# Patient Record
Sex: Female | Born: 1975
Health system: Southern US, Community
[De-identification: ages and names within clinical notes are randomized; demographics above are authoritative.]

## PROBLEM LIST (undated history)

## (undated) DIAGNOSIS — R7303 Prediabetes: Secondary | ICD-10-CM

## (undated) DIAGNOSIS — C50919 Malignant neoplasm of unspecified site of unspecified female breast: Secondary | ICD-10-CM

## (undated) DIAGNOSIS — T7840XA Allergy, unspecified, initial encounter: Secondary | ICD-10-CM

## (undated) DIAGNOSIS — Z973 Presence of spectacles and contact lenses: Secondary | ICD-10-CM

## (undated) DIAGNOSIS — L0390 Cellulitis, unspecified: Secondary | ICD-10-CM

## (undated) HISTORY — DX: Malignant neoplasm of unspecified site of unspecified female breast: C50.919

## (undated) HISTORY — PX: BREAST SURGERY: SHX581

## (undated) HISTORY — PX: ABDOMINAL HYSTERECTOMY: SHX81

## (undated) HISTORY — DX: Allergy, unspecified, initial encounter: T78.40XA

## (undated) HISTORY — PX: OTHER SURGICAL HISTORY: SHX169

---

## 1998-09-22 ENCOUNTER — Emergency Department (HOSPITAL_COMMUNITY): Admission: EM | Admit: 1998-09-22 | Discharge: 1998-09-22 | Payer: Self-pay | Admitting: Emergency Medicine

## 1999-08-19 ENCOUNTER — Other Ambulatory Visit: Admission: RE | Admit: 1999-08-19 | Discharge: 1999-08-19 | Payer: Self-pay | Admitting: Obstetrics and Gynecology

## 1999-09-24 ENCOUNTER — Encounter (INDEPENDENT_AMBULATORY_CARE_PROVIDER_SITE_OTHER): Payer: Self-pay | Admitting: Specialist

## 1999-09-24 ENCOUNTER — Inpatient Hospital Stay (HOSPITAL_COMMUNITY): Admission: AD | Admit: 1999-09-24 | Discharge: 1999-09-25 | Payer: Self-pay | Admitting: Obstetrics and Gynecology

## 2003-03-13 ENCOUNTER — Emergency Department (HOSPITAL_COMMUNITY): Admission: EM | Admit: 2003-03-13 | Discharge: 2003-03-13 | Payer: Self-pay | Admitting: Emergency Medicine

## 2003-03-13 ENCOUNTER — Encounter: Payer: Self-pay | Admitting: Emergency Medicine

## 2005-05-13 ENCOUNTER — Other Ambulatory Visit: Admission: RE | Admit: 2005-05-13 | Discharge: 2005-05-13 | Payer: Self-pay | Admitting: Obstetrics and Gynecology

## 2005-11-24 ENCOUNTER — Inpatient Hospital Stay (HOSPITAL_COMMUNITY): Admission: AD | Admit: 2005-11-24 | Discharge: 2005-11-26 | Payer: Self-pay | Admitting: Obstetrics and Gynecology

## 2010-05-31 ENCOUNTER — Emergency Department (HOSPITAL_COMMUNITY): Admission: EM | Admit: 2010-05-31 | Discharge: 2010-05-31 | Payer: Self-pay | Admitting: Emergency Medicine

## 2011-01-06 ENCOUNTER — Other Ambulatory Visit: Payer: Self-pay | Admitting: Obstetrics and Gynecology

## 2011-01-06 DIAGNOSIS — Z1231 Encounter for screening mammogram for malignant neoplasm of breast: Secondary | ICD-10-CM

## 2011-01-15 ENCOUNTER — Ambulatory Visit
Admission: RE | Admit: 2011-01-15 | Discharge: 2011-01-15 | Disposition: A | Discharge status: Home / Self Care | Payer: BC Managed Care – PPO | Source: Ambulatory Visit | Attending: Obstetrics and Gynecology | Admitting: Obstetrics and Gynecology

## 2011-01-15 DIAGNOSIS — Z1231 Encounter for screening mammogram for malignant neoplasm of breast: Secondary | ICD-10-CM

## 2011-04-10 NOTE — Discharge Summary (Signed)
Cheryl [name], Cheryl [name] NO.:  [id number]   MEDICAL RECORD NO.:  [id number]          PATIENT TYPE:  INP   LOCATION:  9101                          FACILITY:  [facility]   PHYSICIAN:  Huel Cote, M.D. DATE OF BIRTH:  1976-01-27   DATE OF ADMISSION:  11/24/2005  DATE OF DISCHARGE:  11/26/2005                                 DISCHARGE SUMMARY   DISCHARGE DIAGNOSES:  1.  Term pregnancy at 39 plus weeks, delivered.  2.  Status post normal spontaneous vaginal delivery.   DISCHARGE MEDICATIONS:  1.  Motrin 600 milligrams p.o. every 6 hours.  2.  Percocet 1-2 tablets p.o. every 4 hours p.r.n.   HOSPITAL COURSE:  The patient is 35 year old G5, P 2-0-2-2 who was admitted  at 39-5/[redacted] weeks gestation for induction of labor, given term status and  favorable cervix. Prenatal care was uncomplicated. The patient had expressed  her desire for tubal ligation, however, changed her mind regarding this  after admission.   PRENATAL LABORATORIES:  A+, antibody negative, RPR nonreactive, rubella  immune, hepatitis B surface antigen negative, HIV declined, GC negative,  chlamydia negative, group B strep negative, 1-hour Glucola 120.   PAST OBSTETRICAL HISTORY:  She had one elective abortion in 1992.  She had a  vaginal delivery of an 8 pound 5 ounce infant in 1997.  She had a vaginal  delivery of 7 pound 10 ounce infant in 2000.  She had an IUFD at 14 weeks  with a Cytotec induction.   PAST GYNECOLOGIC HISTORY:  None.   PAST SURGICAL HISTORY:  None.   PAST MEDICAL HISTORY:  History of anemia.   MEDICATIONS:  None.   ALLERGIES:  None.   PHYSICAL EXAMINATION:  VITAL SIGNS:  Stable vital signs on admission.  PELVIC:  Cervix was 52 at a -1 station.   She had rupture of membranes performed and was placed on IV Pitocin.  She  progressed to complete dilation and pushed with a normal spontaneous vaginal  delivery of a vigorous female infant over a small first-degree laceration.  Apgars were 8/9.  Weight was 7 pounds 12 ounces. Placenta delivered  spontaneously with a first-degree laceration reapproximated with 2-0 Vicryl  for hemostasis. The patient and husband, after careful consideration,  declined to proceed with a postpartum tubal ligation. She was then admitted  for routine postpartum care and did very well.   On postpartum day #2, she was having adequate pain control with p.o.  medications, voiding without difficulty, and had normal lochia. Her  postpartum hemoglobin was 8.1, down from 8.7.  She was felt stable for  discharge home.   FOLLOW UP:  She was discharged with follow-up in the office in 6 weeks.      Huel Cote, M.D.  Electronically Signed     KR/MEDQ  D:  12/29/2005  T:  12/29/2005  Job:  [id number]

## 2014-06-06 ENCOUNTER — Other Ambulatory Visit (HOSPITAL_COMMUNITY): Payer: Self-pay | Admitting: Obstetrics and Gynecology

## 2014-06-06 DIAGNOSIS — N971 Female infertility of tubal origin: Secondary | ICD-10-CM

## 2014-06-13 ENCOUNTER — Ambulatory Visit (HOSPITAL_COMMUNITY)
Admission: RE | Admit: 2014-06-13 | Discharge: 2014-06-13 | Disposition: A | Discharge status: Home / Self Care | Payer: Federal, State, Local not specified - PPO | Source: Ambulatory Visit | Attending: Obstetrics and Gynecology | Admitting: Obstetrics and Gynecology

## 2014-06-13 DIAGNOSIS — Z3049 Encounter for surveillance of other contraceptives: Secondary | ICD-10-CM | POA: Insufficient documentation

## 2014-06-13 DIAGNOSIS — N971 Female infertility of tubal origin: Secondary | ICD-10-CM

## 2014-06-13 MED ORDER — IOHEXOL 300 MG/ML  SOLN
20.0000 mL | Freq: Once | INTRAMUSCULAR | Status: AC | PRN
  Administered 2014-06-13: 20 mL

## 2014-11-23 DIAGNOSIS — C50919 Malignant neoplasm of unspecified site of unspecified female breast: Secondary | ICD-10-CM

## 2014-11-23 HISTORY — DX: Malignant neoplasm of unspecified site of unspecified female breast: C50.919

## 2015-02-06 ENCOUNTER — Other Ambulatory Visit: Payer: Self-pay | Admitting: Obstetrics and Gynecology

## 2015-02-06 DIAGNOSIS — R928 Other abnormal and inconclusive findings on diagnostic imaging of breast: Secondary | ICD-10-CM

## 2015-02-08 ENCOUNTER — Ambulatory Visit
Admission: RE | Admit: 2015-02-08 | Discharge: 2015-02-08 | Disposition: A | Discharge status: Home / Self Care | Payer: Federal, State, Local not specified - PPO | Source: Ambulatory Visit | Attending: Obstetrics and Gynecology | Admitting: Obstetrics and Gynecology

## 2015-02-08 ENCOUNTER — Other Ambulatory Visit: Payer: Self-pay | Admitting: Obstetrics and Gynecology

## 2015-02-08 DIAGNOSIS — R928 Other abnormal and inconclusive findings on diagnostic imaging of breast: Secondary | ICD-10-CM

## 2015-02-13 ENCOUNTER — Other Ambulatory Visit: Payer: Self-pay | Admitting: Obstetrics and Gynecology

## 2015-02-13 ENCOUNTER — Telehealth: Payer: Self-pay | Admitting: *Deleted

## 2015-02-13 DIAGNOSIS — Z171 Estrogen receptor negative status [ER-]: Secondary | ICD-10-CM | POA: Insufficient documentation

## 2015-02-13 DIAGNOSIS — C50912 Malignant neoplasm of unspecified site of left female breast: Secondary | ICD-10-CM

## 2015-02-13 DIAGNOSIS — C50412 Malignant neoplasm of upper-outer quadrant of left female breast: Secondary | ICD-10-CM

## 2015-02-13 HISTORY — DX: Estrogen receptor negative status (ER-): C50.412

## 2015-02-13 NOTE — Telephone Encounter (Signed)
Confirmed [facility] for 02/20/15 at 8am .  Instructions and contact information given.

## 2015-02-15 ENCOUNTER — Ambulatory Visit
Admission: RE | Admit: 2015-02-15 | Discharge: 2015-02-15 | Disposition: A | Discharge status: Home / Self Care | Payer: Federal, State, Local not specified - PPO | Source: Ambulatory Visit | Attending: Obstetrics and Gynecology | Admitting: Obstetrics and Gynecology

## 2015-02-15 DIAGNOSIS — C50912 Malignant neoplasm of unspecified site of left female breast: Secondary | ICD-10-CM

## 2015-02-15 MED ORDER — GADOBENATE DIMEGLUMINE 529 MG/ML IV SOLN
20.0000 mL | Freq: Once | INTRAVENOUS | Status: AC | PRN
  Administered 2015-02-15: 20 mL via INTRAVENOUS

## 2015-02-18 ENCOUNTER — Ambulatory Visit
Admission: RE | Admit: 2015-02-18 | Discharge: 2015-02-18 | Disposition: A | Discharge status: Home / Self Care | Payer: Federal, State, Local not specified - PPO | Source: Ambulatory Visit | Attending: Obstetrics and Gynecology | Admitting: Obstetrics and Gynecology

## 2015-02-18 ENCOUNTER — Other Ambulatory Visit: Payer: Federal, State, Local not specified - PPO

## 2015-02-18 ENCOUNTER — Other Ambulatory Visit: Payer: Self-pay | Admitting: Obstetrics and Gynecology

## 2015-02-18 DIAGNOSIS — R928 Other abnormal and inconclusive findings on diagnostic imaging of breast: Secondary | ICD-10-CM

## 2015-02-18 HISTORY — PX: BREAST BIOPSY: SHX20

## 2015-02-20 ENCOUNTER — Ambulatory Visit: Payer: Federal, State, Local not specified - PPO | Attending: General Surgery | Admitting: Physical Therapy

## 2015-02-20 ENCOUNTER — Telehealth: Payer: Self-pay | Admitting: Oncology

## 2015-02-20 ENCOUNTER — Ambulatory Visit (HOSPITAL_BASED_OUTPATIENT_CLINIC_OR_DEPARTMENT_OTHER): Payer: Federal, State, Local not specified - PPO | Admitting: Oncology

## 2015-02-20 ENCOUNTER — Encounter: Payer: Self-pay | Admitting: Oncology

## 2015-02-20 ENCOUNTER — Other Ambulatory Visit (HOSPITAL_BASED_OUTPATIENT_CLINIC_OR_DEPARTMENT_OTHER): Payer: Federal, State, Local not specified - PPO

## 2015-02-20 ENCOUNTER — Encounter: Payer: Self-pay | Admitting: Skilled Nursing Facility1

## 2015-02-20 ENCOUNTER — Encounter: Payer: Self-pay | Admitting: Physical Therapy

## 2015-02-20 ENCOUNTER — Encounter: Payer: Self-pay | Admitting: General Practice

## 2015-02-20 ENCOUNTER — Ambulatory Visit
Admission: RE | Admit: 2015-02-20 | Discharge: 2015-02-20 | Disposition: A | Discharge status: Home / Self Care | Payer: Federal, State, Local not specified - PPO | Source: Ambulatory Visit | Attending: Radiation Oncology | Admitting: Radiation Oncology

## 2015-02-20 ENCOUNTER — Ambulatory Visit: Payer: Federal, State, Local not specified - PPO

## 2015-02-20 VITALS — BP 143/83 | HR 67 | Resp 18 | Ht 62.0 in | Wt 218.5 lb

## 2015-02-20 DIAGNOSIS — D0512 Intraductal carcinoma in situ of left breast: Secondary | ICD-10-CM | POA: Diagnosis not present

## 2015-02-20 DIAGNOSIS — Z6839 Body mass index (BMI) 39.0-39.9, adult: Secondary | ICD-10-CM

## 2015-02-20 DIAGNOSIS — C50412 Malignant neoplasm of upper-outer quadrant of left female breast: Secondary | ICD-10-CM

## 2015-02-20 DIAGNOSIS — R293 Abnormal posture: Secondary | ICD-10-CM | POA: Diagnosis not present

## 2015-02-20 LAB — CBC WITH DIFFERENTIAL/PLATELET
BASO%: 0.8 % (ref 0.0–2.0)
Basophils Absolute: 0.1 10*3/uL (ref 0.0–0.1)
EOS%: 2 % (ref 0.0–7.0)
Eosinophils Absolute: 0.2 10*3/uL (ref 0.0–0.5)
HCT: 38.1 % (ref 34.8–46.6)
HGB: 12.8 g/dL (ref 11.6–15.9)
LYMPH#: 2.3 10*3/uL (ref 0.9–3.3)
LYMPH%: 28.1 % (ref 14.0–49.7)
MCH: 29.5 pg (ref 25.1–34.0)
MCHC: 33.5 g/dL (ref 31.5–36.0)
MCV: 88 fL (ref 79.5–101.0)
MONO#: 0.5 10*3/uL (ref 0.1–0.9)
MONO%: 6.2 % (ref 0.0–14.0)
NEUT#: 5.1 10*3/uL (ref 1.5–6.5)
NEUT%: 62.9 % (ref 38.4–76.8)
Platelets: 337 10*3/uL (ref 145–400)
RBC: 4.33 10*6/uL (ref 3.70–5.45)
RDW: 13.4 % (ref 11.2–14.5)
WBC: 8.2 10*3/uL (ref 3.9–10.3)

## 2015-02-20 LAB — COMPREHENSIVE METABOLIC PANEL (CC13)
ALT: 14 U/L (ref 0–55)
AST: 14 U/L (ref 5–34)
Albumin: 3.8 g/dL (ref 3.5–5.0)
Alkaline Phosphatase: 66 U/L (ref 40–150)
Anion Gap: 9 mEq/L (ref 3–11)
BUN: 10.7 mg/dL (ref 7.0–26.0)
CHLORIDE: 108 meq/L (ref 98–109)
CO2: 21 meq/L — AB (ref 22–29)
CREATININE: 0.8 mg/dL (ref 0.6–1.1)
Calcium: 8.7 mg/dL (ref 8.4–10.4)
Glucose: 118 mg/dl (ref 70–140)
Potassium: 3.8 mEq/L (ref 3.5–5.1)
SODIUM: 138 meq/L (ref 136–145)
Total Bilirubin: 0.89 mg/dL (ref 0.20–1.20)
Total Protein: 7.3 g/dL (ref 6.4–8.3)

## 2015-02-20 MED ORDER — TAMOXIFEN CITRATE 20 MG PO TABS: 20.0000 mg | ORAL_TABLET | Freq: Every day | ORAL | Status: DC

## 2015-02-20 NOTE — Progress Notes (Signed)
  Radiation Oncology         [phone]) [phone] ________________________________  Initial outpatient Consultation - Date: 02/20/2015   Name: Cheryl [name] MRN: [id number]   DOB: October 31, 1976  REFERRING PHYSICIAN: [name], MD   STAGE: Breast cancer of upper-outer quadrant of left female breast   Staging form: Breast, AJCC 7th Edition     Clinical: Stage 0 (Tis (DCIS), N0, M0) - Signed by [name], MD on 02/20/2015  HISTORY OF PRESENT ILLNESS::Cheryl [name] is a 39 y.o. female  Who was found to have screening calcifications in the upper outer quadrant of the left breast. These measured 4.2 x 2.6 cm. She had a biopsy that showed DCIS with areas suspicious for invasive ductal carcinoma. MRI showed 4.5 cm of non-mass like enhancement with a 1 cm spiculated mass and ahematoma. 3 other areas in the left and right breast were biopsied and benign. [name] GxP3. She has done well since her biopsy. [name] accompanied by her husband and 3 daughters.   PREVIOUS RADIATION THERAPY: No  Past medical, social and family history were reviewed in the electronic chart. Review of symptoms was reviewed in the electronic chart. Medications were reviewed in the electronic chart.   PHYSICAL EXAM: There were no vitals filed for this visit.. . Pleasant and in no distress.   IMPRESSION: DCIS of the left breast  PLAN: I spoke to the patient today regarding her diagnosis and options for treatment. We discussed the equivalence in terms of survival and local failure between mastectomy and breast conservation. We discussed the role of radiation in decreasing local failures in patients who undergo lumpectomy. We discussed the process of simulation and the placement tattoos. We discussed 4-6 weeks of treatment as an outpatient. We discussed the possibility of asymptomatic lung damage. We discussed the low likelihood of secondary malignancies. We discussed the possible side effects including but not limited to  skin redness, fatigue, permanent skin darkening, and breast swelling.    She will undergo genetic testing and will be placed on neoadjuvant tamoxifen. If there is an invasive component over 1 cm, she may need Oncotype testing.   I spent 60 minutes  face to face with the patient and more than 50% of that time was spent in counseling and/or coordination of care.   ------------------------------------------------  [name], MD

## 2015-02-20 NOTE — Progress Notes (Signed)
Cheryl [name]  [phone] [phone]     ID: Cheryl [name] DOB: Jul 09, 1976  MR#: [id number]  QVZ#:[id number]  Patient Care Team: No Pcp Per Patient as PCP - General (General Practice) [name], MD as Consulting Physician (Obstetrics and Gynecology) [name], MD as Consulting Physician (General Surgery) [name], MD as Consulting Physician (Oncology) [name], MD as Consulting Physician (Radiation Oncology) [name], RN as Registered Nurse [name], RN as Registered Nurse [name], NP as Nurse Practitioner (Nurse Practitioner) PCP: No PCP Per Patient OTHER MD:  CHIEF COMPLAINT: Left breast cancer  CURRENT TREATMENT: Awaiting definitive surgery; tamoxifen   BREAST CANCER HISTORY: The patient had screening mammography 02/01/2015 which showed a calcifications in the upper outer quadrant of the left breast. On 02/08/2015 she underwent left diagnostic mammography with tomosynthesis and left breast ultrasonography at the [facility] Center. The breast density was category C. Mammography confirmed a group of pleomorphic calcifications spanning 4.2 cm maximally. Physical examination of the upper outer quadrant of the left breast showed an area of thickening at the approximate 2:00 position. Ultrasound of this area showed no discrete masses. There was vague shadowing present. Calcifications could not be clearly identified sonographically. There was no lymphadenopathy in the left axilla.  On the same day the patient underwent biopsy of the left breast area in question, with the pathology (S [id number]) showing ductal carcinoma in situ, grade 2 or 3, with possible areas of microinvasion, the cancer cells being strongly estrogen and progesterone receptor positive, both at 100%.  On 02/15/2015 the patient underwent bilateral breast MRI. This showed the area of malignancy in the upper outer quadrant to measure 4.5  cm, including a large area of non-masslike enhancement and a 1 cm spiculated mass. There was also an indeterminate oval mass in the central left breast and another indeterminant mass in the slightly outer right breast anteriorly. Biopsy of the right breast mass 02/18/2015 ([id number]) showed a fibroadenoma, with no evidence of malignancy. Biopsy of 2 additional areas of the left breast (at 10:00 and 2:00) showed a fibroadenoma in the upper inner quadrant, and pseudo-angiomatous stromal hyperplasia (PA SH) at the 2:00 area of the left breast.  The patient's subsequent history is as detailed below.  INTERVAL HISTORY: Halo was evaluated in the multidisciplinary breast cancer clinic 02/20/2015 accompanied by her husband Cheryl [name] and her daughter's Cheryl [name] and Cheryl [name]. Her case was also discussed at the multidisciplinary cancer conference the same day, where a preliminary plan was suggested including breast conserving surgery with sentinel lymph node, genetics referral, and radiation, chemotherapy, and anti-hormones depending on the final pathology results.  REVIEW OF SYSTEMS: There were no specific symptoms leading to the original mammogram, which was routinely scheduled. The patient denies unusual headaches, visual changes, nausea, vomiting, stiff neck, dizziness, or gait imbalance. There has been no cough, phlegm production, or pleurisy, no chest pain or pressure, and no change in bowel or bladder habits. The patient denies fever, rash, bleeding, unexplained fatigue or unexplained weight loss. She does not exercise regularly. A detailed review of systems was otherwise entirely negative.  PAST MEDICAL HISTORY: Past Medical History  Diagnosis Date  . Breast cancer     PAST SURGICAL HISTORY: History reviewed. No pertinent past surgical history.  FAMILY HISTORY Family History  Problem Relation Age of Onset  . Lung cancer Maternal Grandmother   . Lung cancer Maternal Grandfather    the patient's  parents are living, in their  mid-50s as of March 2016. The patient had no siblings. Both the patient's mother's parents died from lung cancer although they did not smoke. [place] and her maternal grandfather was a Ecologist. There is no history of breast or ovarian cancer in the family to her knowledge  GYNECOLOGIC HISTORY:  Patient's last menstrual period was 02/01/2015. Menarche age 80, first live birth age 60. The patient is GX P3. [name] having regular periods. She uses the Essure device for contraception.  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 Her husband Cheryl [name] is a Freight forwarder. Their daughter Cheryl [name] lives in [place] where she works in Press photographer. Daughter Cheryl [name] is a Research scientist (physical sciences) and lives with the patient.. Daughter Cheryl [name] also at home is 40 years old.    ADVANCED DIRECTIVES: Not in place   HEALTH MAINTENANCE: History  Substance Use Topics  . Smoking status: Never Smoker   . Smokeless tobacco: Not on file  . Alcohol Use: Yes     Colonoscopy:  PAP:  Bone density:  Lipid panel:  Allergies  Allergen Reactions  . Adhesive [Tape] Other (See Comments)    Skin blisters    Current Outpatient Prescriptions  Medication Sig Dispense Refill  . ibuprofen (ADVIL,MOTRIN) 400 MG tablet Take 400 mg by mouth every 6 (six) hours as needed.    . tamoxifen (NOLVADEX) 20 MG tablet Take 1 tablet (20 mg total) by mouth daily. 90 tablet 4   No current facility-administered medications for this visit.    OBJECTIVE: There-appearing white woman in no acute distress Filed Vitals:   02/20/15 0918  BP: 143/83  Pulse: 67  Resp: 18     Body mass index is 39.95 kg/(m^2).    ECOG FS:0 - Asymptomatic  Ocular: Sclerae unicteric, pupils equal, round and reactive to light Ear-nose-throat: Oropharynx clear, dentition in good repair Lymphatic: No cervical or supraclavicular adenopathy Lungs no rales or rhonchi, good excursion  bilaterally Heart regular rate and rhythm, no murmur appreciated Abd soft, nontender, positive bowel sounds MSK no focal spinal tenderness, no joint edema Neuro: non-focal, well-oriented, appropriate affect Breasts: The right breast is unremarkable. The left breast is status post recent biopsy. I do not palpate any suspicious masses and there are no skin or nipple areas of concern. The left axilla is benign.   LAB RESULTS:  CMP     Component Value Date/Time   NA 138 02/20/2015 0823   K 3.8 02/20/2015 0823   CO2 21* 02/20/2015 0823   GLUCOSE 118 02/20/2015 0823   BUN 10.7 02/20/2015 0823   CREATININE 0.8 02/20/2015 0823   CALCIUM 8.7 02/20/2015 0823   PROT 7.3 02/20/2015 0823   ALBUMIN 3.8 02/20/2015 0823   AST 14 02/20/2015 0823   ALT 14 02/20/2015 0823   ALKPHOS 66 02/20/2015 0823   BILITOT 0.89 02/20/2015 0823    INo results found for: SPEP, UPEP  Lab Results  Component Value Date   WBC 8.2 02/20/2015   NEUTROABS 5.1 02/20/2015   HGB 12.8 02/20/2015   HCT 38.1 02/20/2015   MCV 88.0 02/20/2015   PLT 337 02/20/2015      Chemistry      Component Value Date/Time   NA 138 02/20/2015 0823   K 3.8 02/20/2015 0823   CO2 21* 02/20/2015 0823   BUN 10.7 02/20/2015 0823   CREATININE 0.8 02/20/2015 0823      Component Value Date/Time   CALCIUM 8.7 02/20/2015 0823  ALKPHOS 66 02/20/2015 0823   AST 14 02/20/2015 0823   ALT 14 02/20/2015 0823   BILITOT 0.89 02/20/2015 0823       No results found for: LABCA2  No components found for: LABCA125  No results for input(s): INR in the last 168 hours.  Urinalysis No results found for: COLORURINE, APPEARANCEUR, LABSPEC, PHURINE, GLUCOSEU, HGBUR, BILIRUBINUR, KETONESUR, PROTEINUR, UROBILINOGEN, NITRITE, LEUKOCYTESUR  STUDIES: Mr Breast Bilateral W Wo Contrast  02/15/2015   CLINICAL DATA:  39 year old female with recently diagnosed ductal carcinoma in situ with areas suspicious for invasion following stereotactic guided  biopsy of calcifications in the upper-outer left breast which span a distance of 4.2 cm on 02/08/2015.  An ultrasound was performed on 02/08/2015, however no definite mass was visualized in the upper-outer quadrant of the left breast although may have been obscured by the large area of vague shadowing likely corresponding to the calcifications. No lymphadenopathy was seen in the left axilla on ultrasound from 02/08/2015.  LABS:  Not applicable.  EXAM: BILATERAL BREAST MRI WITH AND WITHOUT CONTRAST  TECHNIQUE: Multiplanar, multisequence MR images of both breasts were obtained prior to and following the intravenous administration of 20 ml of MultiHance.  THREE-DIMENSIONAL MR IMAGE RENDERING ON INDEPENDENT WORKSTATION:  Three-dimensional MR images were rendered by post-processing of the original MR data on an independent workstation. The three-dimensional MR images were interpreted, and findings are reported in the following complete MRI report for this study. Three dimensional images were evaluated at the independent DynaCad workstation  COMPARISON:  Previous exam(s).  FINDINGS: Breast composition: c.  Heterogeneous fibroglandular tissue.  Background parenchymal enhancement: Marked.  Right breast: There is an indeterminate 7 mm mass with slightly irregular margins in the slightly outer right breast anterior depth. No additional suspicious areas of enhancement seen elsewhere in the right breast.  Left breast: A rim enhancing hematoma is present in the upper-outer left breast measuring 1.7 cm at site of biopsy proven malignancy. Biopsy clip artifact is present along the inferior margin of the hematoma. There is a 1 x 0.9 x 0.9 cm spiculated mass adjacent/ medial to the hematoma and biopsy marking clip. This corresponds with a spiculated mass seen in the upper-outer left breast on mammography. There is ill-defined surrounding non mass enhancement likely corresponding to the suspicious calcifications seen on mammography.  The non mass enhancement, including the spiculated mass and hematoma measures up to 3.3 x 4.5 cm. This blends in with the background enhancement and is difficult to accurately measure.  There is an oval mass in the central to slightly medial left breast anterior depth measuring 1.3 x 0.9 cm, indeterminate but possibly a fibroadenoma given the suggestion of internal septations.  Lymph nodes: No morphologically abnormal axillary lymph nodes. No internal mammary lymphadenopathy.  Ancillary findings:  None.  IMPRESSION: 1. Biopsy proven malignancy in the upper-outer left breast measures up to 4.5 cm, which includes the large area of non mass enhancement, 1 cm spiculated mass, and associated hematoma.  2. Indeterminate oval mass in the central to slightly medial left breast anterior depth.  2. Indeterminate mass in the slightly outer right breast anterior depth.  RECOMMENDATION: 1. If breast conservation is being considered, second-look ultrasound with possible biopsy is recommended for the oval mass in the central to slightly medial left breast anterior depth.  2. Second-look ultrasound for the spiculated mass in the upper-outer left breast could be performed to prove invasive disease, however given adjacent biopsy related hematoma and vague shadowing from the calcifications this  spiculated mass may not be seen sonographically. If this cannot be seen sonographically, then MRI guided biopsy could be performed.  3. Second-look ultrasound with possible biopsy is recommended for the mass in the slightly outer right breast anterior depth.  If these masses cannot be seen via ultrasound, then MRI guided biopsy is recommended.  BI-RADS CATEGORY  4: Suspicious.   Electronically Signed   By: Everlean Alstrom M.D.   On: 02/15/2015 16:14   Mm Digital Diagnostic Bilat  02/18/2015   CLINICAL DATA:  Status post bilateral ultrasound-guided core needle biopsies.  EXAM: DIAGNOSTIC BILATERAL MAMMOGRAM POST ULTRASOUND BIOPSY   COMPARISON:  Previous exam(s).  FINDINGS: Mammographic images were obtained following ultrasound guided biopsy of an 8 mm mass in the 9 o'clock position of the right breast, a 1.1 cm mass in the 10 o'clock position of the left breast and a 1.0 cm mass-like area in the 2 o'clock position of the left breast.  These demonstrate a coil shaped biopsy marker clip in the 9 o'clock position of the right breast at the expected location of the biopsied mass.  There is a coil shaped biopsy marker clip in the 10 o'clock position of the left breast at the expected location of the mass biopsied in the 10 o'clock position of the breast.  There is a heart shaped biopsy marker clip in the 2 o'clock position of the left breast at the expected location of the mas-like area biopsied in the 2 o'clock position. An X shaped clip is located 5 cm more posteriorly and inferiorly in the breast. The heart shaped clip does not correspond to the location of the irregular mass-like area seen adjacent to the recently biopsied ductal carcinoma in situ seen in the upper outer left breast at MRI.  IMPRESSION: Appropriate clip deployment bilaterally, as described above. The mass-like area biopsied in the anterior aspect of the 2 o'clock position of the left breast does not correspond to the irregular mass-like area seen adjacent to the recently biopsied ductal carcinoma in situ in the upper outer left breast. There was no ultrasound correlate for that abnormality earlier today.  Final Assessment: Post Procedure Mammograms for Marker Placement   Electronically Signed   By: Claudie Revering M.D.   On: 02/18/2015 17:27   Mm Digital Diagnostic Unilat L  02/08/2015   CLINICAL DATA:  Post biopsy of suspicious calcifications in the upper-outer left breast.  EXAM: DIAGNOSTIC LEFT MAMMOGRAM POST ULTRASOUND BIOPSY  COMPARISON:  Previous exams  FINDINGS: Mammographic images were obtained following stereotactic guided biopsy of suspicious calcifications in the  upper-outer left breast. An X shaped biopsy marking clip is present in the targeted region of the calcifications.  IMPRESSION: X shaped biopsy marking clip in the targeted region post biopsy of suspicious calcifications in the upper-outer left breast.  Final Assessment: Post Procedure Mammograms for Marker Placement   Electronically Signed   By: Everlean Alstrom M.D.   On: 02/08/2015 12:39   US Breast Ltd Uni Left Inc Axilla  02/18/2015   CLINICAL DATA:  Masses in both breasts at recent staging MRI of the breasts. Recently diagnosed ductal carcinoma in situ with possible areas of invasion in the upper outer left breast.  EXAM: ULTRASOUND OF THE BILATERAL BREAST  COMPARISON:  Previous exam(s).  FINDINGS: On physical exam, no mass is palpable in either breast or either axilla.  Targeted ultrasound is performed, showing an 8 x 4 x 4 mm oval, horizontally oriented, circumscribed, hypoechoic solid mass in the 9  o'clock position of the right breast, 6 cm from the nipple. This corresponds to the mass seen at that location on the recent MRI.  In the 10 o'clock position of the left breast, 3 cm from the nipple, an oval, circumscribed, hypoechoic mass is demonstrated measuring 1.1 x 1.1 x 1.0 cm in maximum dimensions. This corresponds to the medial left breast mass seen on the recent MRI.  In the 2 o'clock position of the left breast, 1 cm from the nipple, there is a 1.0 x 0.6 x 0.5 cm irregular hypoechoic area with some posterior acoustical shadowing, possibly corresponding to the irregular mass seen in the outer left breast on the recent MRI.  IMPRESSION: Masses in the 9 o'clock position of the right breast, 10 o'clock position of the left breast and 2 o'clock position of the left breast, as described above.  RECOMMENDATION: Ultrasound-guided core needle biopsy of the right breast mass and the 2 left breast masses. These are scheduled follow.  I have discussed the findings and recommendations with the patient. Results  were also provided in writing at the conclusion of the visit. If applicable, a reminder letter will be sent to the patient regarding the next appointment.  BI-RADS CATEGORY  4: Suspicious.   Electronically Signed   By: Claudie Revering M.D.   On: 02/18/2015 17:10   US Breast Ltd Uni Left Inc Axilla  02/08/2015   CLINICAL DATA:  Screening recall for calcifications in the upper-outer left breast.  EXAM: DIGITAL DIAGNOSTIC LEFT MAMMOGRAM WITH 3D TOMOSYNTHESIS AND CAD  LEFT BREAST ULTRASOUND  COMPARISON:  02/01/2015 and 01/14/2011  ACR Breast Density Category c: The breast tissue is heterogeneously dense, which may obscure small masses.  FINDINGS: Spot compression CC and mL the use were performed of the left breast demonstrating a large group of pleomorphic calcifications, many of which appear linear and branching at the approximate 1 to 2 o'clock position of the left breast. The spanning a distance of 4.2 x 2.6 cm.  Mammographic images were processed with CAD.  Physical examination of the upper-outer left breast reveals a large area of thickening at the approximate 2 o'clock position of the left breast.  Targeted ultrasound of the left breast was performed. No discrete masses are seen in the upper-outer left breast although there is vague shadowing present at the approximate 2 o'clock position. Calcifications cannot be clearly identified sonographically. There is no lymphadenopathy seen in the left axilla.  IMPRESSION: Suspicious calcifications in the upper-outer left breast.  RECOMMENDATION: Stereotactic guided biopsy of the suspicious calcifications in the upper-outer left breast is recommended. This will subsequently be performed and dictated separately.  I have discussed the findings and recommendations with the patient. Results were also provided in writing at the conclusion of the visit. If applicable, a reminder letter will be sent to the patient regarding the next appointment.  BI-RADS CATEGORY  4: Suspicious.    Electronically Signed   By: Everlean Alstrom M.D.   On: 02/08/2015 12:37   US Breast Ltd Uni Right Inc Axilla  02/18/2015   CLINICAL DATA:  Masses in both breasts at recent staging MRI of the breasts. Recently diagnosed ductal carcinoma in situ with possible areas of invasion in the upper outer left breast.  EXAM: ULTRASOUND OF THE BILATERAL BREAST  COMPARISON:  Previous exam(s).  FINDINGS: On physical exam, no mass is palpable in either breast or either axilla.  Targeted ultrasound is performed, showing an 8 x 4 x 4 mm oval, horizontally oriented,  circumscribed, hypoechoic solid mass in the 9 o'clock position of the right breast, 6 cm from the nipple. This corresponds to the mass seen at that location on the recent MRI.  In the 10 o'clock position of the left breast, 3 cm from the nipple, an oval, circumscribed, hypoechoic mass is demonstrated measuring 1.1 x 1.1 x 1.0 cm in maximum dimensions. This corresponds to the medial left breast mass seen on the recent MRI.  In the 2 o'clock position of the left breast, 1 cm from the nipple, there is a 1.0 x 0.6 x 0.5 cm irregular hypoechoic area with some posterior acoustical shadowing, possibly corresponding to the irregular mass seen in the outer left breast on the recent MRI.  IMPRESSION: Masses in the 9 o'clock position of the right breast, 10 o'clock position of the left breast and 2 o'clock position of the left breast, as described above.  RECOMMENDATION: Ultrasound-guided core needle biopsy of the right breast mass and the 2 left breast masses. These are scheduled follow.  I have discussed the findings and recommendations with the patient. Results were also provided in writing at the conclusion of the visit. If applicable, a reminder letter will be sent to the patient regarding the next appointment.  BI-RADS CATEGORY  4: Suspicious.   Electronically Signed   By: Claudie Revering M.D.   On: 02/18/2015 17:10   Mm Diag Breast Tomo Uni Left  02/08/2015   CLINICAL  DATA:  Screening recall for calcifications in the upper-outer left breast.  EXAM: DIGITAL DIAGNOSTIC LEFT MAMMOGRAM WITH 3D TOMOSYNTHESIS AND CAD  LEFT BREAST ULTRASOUND  COMPARISON:  02/01/2015 and 01/14/2011  ACR Breast Density Category c: The breast tissue is heterogeneously dense, which may obscure small masses.  FINDINGS: Spot compression CC and mL the use were performed of the left breast demonstrating a large group of pleomorphic calcifications, many of which appear linear and branching at the approximate 1 to 2 o'clock position of the left breast. The spanning a distance of 4.2 x 2.6 cm.  Mammographic images were processed with CAD.  Physical examination of the upper-outer left breast reveals a large area of thickening at the approximate 2 o'clock position of the left breast.  Targeted ultrasound of the left breast was performed. No discrete masses are seen in the upper-outer left breast although there is vague shadowing present at the approximate 2 o'clock position. Calcifications cannot be clearly identified sonographically. There is no lymphadenopathy seen in the left axilla.  IMPRESSION: Suspicious calcifications in the upper-outer left breast.  RECOMMENDATION: Stereotactic guided biopsy of the suspicious calcifications in the upper-outer left breast is recommended. This will subsequently be performed and dictated separately.  I have discussed the findings and recommendations with the patient. Results were also provided in writing at the conclusion of the visit. If applicable, a reminder letter will be sent to the patient regarding the next appointment.  BI-RADS CATEGORY  4: Suspicious.   Electronically Signed   By: Everlean Alstrom M.D.   On: 02/08/2015 12:37   Mm Lt Breast Bx W Loc Dev 1st Lesion Image Bx Spec Stereo Guide  02/12/2015   ADDENDUM REPORT: 02/12/2015 15:19  ADDENDUM: Pathology results: Pathology results from the stereotactic guided biopsy of calcifications in the upper-outer left  breast revealed DCIS with possible focal areas of invasion. This is concordant with the imaging findings. The patient has been notified of the results. [name] doing well and denies any biopsy site complications.  [name] scheduled for multidisciplinary  Clinic on 02/20/2015 at 8 a.m. and she will be called with an appointment for a breast MRI. The patient has been instructed to call the [facility] with any questions or concerns.   Electronically Signed   By: Everlean Alstrom M.D.   On: 02/12/2015 15:19   02/12/2015   CLINICAL DATA:  39 year old female with suspicious calcifications in the upper-outer left breast.  EXAM: RIGHT BREAST STEREOTACTIC CORE NEEDLE BIOPSY  COMPARISON:  Previous exams.  FINDINGS: The patient and I discussed the procedure of stereotactic-guided biopsy including benefits and alternatives. We discussed the high likelihood of a successful procedure. We discussed the risks of the procedure including infection, bleeding, tissue injury, clip migration, and inadequate sampling. Informed written consent was given. The usual time out protocol was performed immediately prior to the procedure.  Using sterile technique and 2% Lidocaine as local anesthetic, under stereotactic guidance, a 9 gauge vacuum assisted device was used to perform core needle biopsy of the suspicious calcifications in the upper-outer left breast using a lateral approach. Specimen radiograph was performed showing the presence of calcifications. Specimens with calcifications are identified for pathology.  At the conclusion of the procedure, an X shaped tissue marker clip was deployed into the biopsy cavity. Follow-up 2-view mammogram was performed and dictated separately.  IMPRESSION: Stereotactic-guided biopsy of suspicious calcifications in the upper-outer left breast. No apparent complications.  Electronically Signed: By: Everlean Alstrom M.D. On: 02/08/2015 12:38   [place] Lt Breast Bx W Loc Dev 1st Lesion Img Bx Spec US  Guide  02/20/2015   ADDENDUM REPORT: 02/20/2015 09:01  ADDENDUM: Pathology revealed a fibroadenoma in the right breast. In the left breast biopsy at 9:00, there is a fibroadenoma and in the left breast biopsy at 2:00, there is pseudoangiomatous stomal hyperplasia ([place]). This was found to be concordant by Dr. Claudie Revering. Pathology was discussed with the patient by telephone. She reported doing well after the biopsy with tenderness and bruising at the sites. Post biopsy instructions and care were reviewed and her questions were answered. She has recently been diagnosed with left breast ductal carcinoma in situ and will [facility] on February 20, 2015. She has my number for additional questions and concerns.  Pathology results reported by Susa Raring RN, BSN on February 20, 2015.   Electronically Signed   By: Claudie Revering M.D.   On: 02/20/2015 09:01   02/20/2015   CLINICAL DATA:  1.1 cm mass in the 10 o'clock position of the left breast at recent ultrasound and MRI.  EXAM: ULTRASOUND GUIDED LEFT BREAST CORE NEEDLE BIOPSY  COMPARISON:  Previous exam(s).  PROCEDURE: I met with the patient and we discussed the procedure of ultrasound-guided biopsy, including benefits and alternatives. We discussed the high likelihood of a successful procedure. We discussed the risks of the procedure including infection, bleeding, tissue injury, clip migration, and inadequate sampling. Informed written consent was given. The usual time-out protocol was performed immediately prior to the procedure.  Using sterile technique and 2% Lidocaine as local anesthetic, under direct ultrasound visualization, a 12 gauge vacuum-assisted device was used to perform biopsy of the 1.1 cm mass seen in the 10 o'clock position of the left breast at ultrasound earlier todayusing a caudal approach. At the conclusion of the procedure, a coil shaped tissue marker clip was deployed into the biopsy cavity. Follow-up  2-view mammogram was performed and dictated separately.  IMPRESSION: Ultrasound-guided biopsy of a 1.1 cm 10 o'clock left  breast mass. No apparent complications.  Electronically Signed: By: Claudie Revering M.D. On: 02/18/2015 17:13   [place] Lt Breast Bx W Loc Dev Ea Add Lesion Img Bx Spec US Guide  02/20/2015   ADDENDUM REPORT: 02/20/2015 09:02  ADDENDUM: Pathology revealed a fibroadenoma in the right breast. 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 This was found to be concordant by Dr. Claudie Revering. Pathology was discussed with the patient by telephone. She reported doing well after the biopsy with tenderness and bruising at the sites. Post biopsy instructions and care were reviewed and her questions were answered. 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 She has my number for additional questions and concerns.  Pathology results reported by Susa Raring RN, BSN on February 20, 2015.   Electronically Signed   By: Claudie Revering M.D.   On: 02/20/2015 09:02   02/20/2015   CLINICAL DATA:  1.0 cm irregular mass-like area in the 2 o'clock position of the left breast at recent ultrasound and MRI.  EXAM: ULTRASOUND GUIDED LEFT BREAST CORE NEEDLE BIOPSY  COMPARISON:  Previous exam(s).  FINDINGS: I met with the patient and we discussed the procedure of ultrasound-guided biopsy, including benefits and alternatives. We discussed the high likelihood of a successful procedure. We discussed the risks of the procedure, including infection, bleeding, tissue injury, clip migration, and inadequate sampling. Informed written consent was given. The usual time-out protocol was performed immediately prior to the procedure.  Using sterile technique and 2% Lidocaine as local anesthetic, under direct ultrasound  visualization, a 12 gauge spring-loaded device was used to perform biopsy of the 1.0 cm irregular mass-like area seen in the 2 o'clock position of the left breast at ultrasound earlier today using a caudal approach. At the conclusion of the procedure a heart shaped tissue marker clip was deployed into the biopsy cavity. Follow up 2 view mammogram was performed and dictated separately.  IMPRESSION: Ultrasound guided biopsy of a 1.0 cm 2 o'clock left breast mass-like area. No apparent complications.  Electronically Signed: By: Claudie Revering M.D. On: 02/18/2015 17:15   [place] Rt Breast Bx W Loc Dev 1st Lesion Img Bx Spec US Guide  02/20/2015   ADDENDUM REPORT: 02/20/2015 09:01  ADDENDUM: Pathology revealed a fibroadenoma in the right breast. 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 This was found to be concordant by Dr. Claudie Revering. Pathology was discussed with the patient by telephone. She reported doing well after the biopsy with tenderness and bruising at the sites. Post biopsy instructions and care were reviewed and her questions were answered. 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 She has my number for additional questions and concerns.  Pathology results reported by Susa Raring RN, BSN on February 20, 2015.   Electronically Signed   By: Claudie Revering M.D.   On: 02/20/2015 09:01   02/20/2015   CLINICAL DATA:  8 mm mass in the 9 o'clock position of the right breast at recent ultrasound and MRI.  EXAM: ULTRASOUND GUIDED RIGHT BREAST CORE NEEDLE BIOPSY  COMPARISON:  Previous  exam(s).  PROCEDURE: I met with the patient and we discussed the procedure of ultrasound-guided biopsy, including benefits and alternatives. We discussed the high likelihood of a successful procedure. We discussed the  risks of the procedure including infection, bleeding, tissue injury, clip migration, and inadequate sampling. Informed written consent was given. The usual time-out protocol was performed immediately prior to the procedure.  Using sterile technique and 2% Lidocaine as local anesthetic, under direct ultrasound visualization, a 12 gauge vacuum-assisted device was used to perform biopsy of the 8 mm mass seen in the 8 o'clock position of the right breast at ultrasound earlier todayusing a caudal approach. At the conclusion of the procedure, a coil shaped tissue marker clip was deployed into the biopsy cavity. Follow-up 2-view mammogram was performed and dictated separately.  IMPRESSION: Ultrasound-guided biopsy of an 8 mm 9 o'clock right breast mass. No apparent complications.  Electronically Signed: By: Claudie Revering M.D. On: 02/18/2015 17:11    ASSESSMENT: 39 y.o. [place], [place] woman status post left breast biopsy 02/08/2015 for ductal carcinoma in situ, grade 2 or 3, strongly estrogen and progesterone receptor positive, with likely areas of microinvasion  (a) biopsy of an area in the left breast upper outer quadrant showed PASH  (b) biopsy of 2 additional questionable areas, one in each breast, showed bilateral fibroadenomas  (1) genetics testing will help guide the surgical decision  (2) definitive surgery to follow  (3) if there is invasive disease in excess of 5 mm, we will request an Oncotype to help with the chemotherapy decision  (4) radiation to follow breast conserving surgery  (5) tamoxifen started 02/20/2015 (neoadjuvantly)  PLAN: We spent the better part of today's hour-long appointment discussing the biology of breast cancer in general, and the specifics of the patient's tumor in particular. Ruth understands that so far we have only documented noninvasive breast cancer. In itself noninvasive breast cancer cannot travel to vital organ and therefore it is not life threatening.  On the  other hand we do see an area in the MRI as suspicious for an invasive component measuring approximately 1 cm. There were areas in the initial biopsy also that looked like microinvasion. For that reason we discussed the difference between local and systemic therapy in detail today.  The patient understands that surgery and radiation are local treatments. If there is microscopic spread of tumor, optimal local treatment would not be expected to control last. For that reason we would consider systemic therapy. She would be certainly a candidate for anti-estrogens. She may remain not be a candidate for anti-HER-2 treatments. That test has not been sent. Generally we would prefer to not use chemotherapy in very early invasive tumors and if there is an invasive component greater than 5 mm we will request an Oncotype to help guide the chemotherapy decision.  Because the patient warrants genetic testing and because the results of genetics May affect her surgical options, there will be a delay in her definitive surgery. For that reason we discussed starting tamoxifen now. We discussed the possible toxicities, side effects and complications of this agent. [name] eager to get it started since she feels even if she does not have an invasive component she may want to be an anti-estrogens prophylactically (to prevent another breast cancer from developing in either breast in the future, that risk being approximately 1% per year in someone like her in the absence of anti-estrogens).  She will see me again in May, by which time we will have  definitive results from her surgery and should be able to make a clear decision regarding optimal systemic adjuvant treatment.  Jermika has a good understanding of the overall plan. She agrees with it. She knows the goal of treatment in her case is cure. She will call with any problems that may develop before her next visit here.  [name], MD   02/20/2015 6:09 PM Medical  Oncology and Hematology [facility] [address] [place], [place] [address] Tel. [phone]    Fax. [phone]

## 2015-02-20 NOTE — Progress Notes (Signed)
Subjective:     Patient ID: Cheryl [name], female   DOB: August 25, 1976, 39 y.o.   MRN: [id number]  HPI   Review of Systems     Objective:   Physical Exam For the patient to understand and be given the tools to implement a healthy plant based diet during their cancer diagnosis.     Assessment:     Patient was seen today and found to be pleasant with seemingly unsupportive daughters (2) and a husband. These individuals were not found to be attentive nor was their attention kept. Pts left breast is afflicted. Pt states she will increase her physical activity by walking on the indoor track of a church near by. Pts weight 218 height 5'2'' BMI 40. Pts CO2 21. Pt states she will try Quinoa and enjoys beans and rice.     Plan:     Dietitian educated the patient on implementing a plant based diet by incorporating more plant proteins, fruits, and vegetables. As a part of a healthy routine physical activity was discussed. The importance of legitimate, evidence based information was discussed and examples were given. A folder of evidence based information with a focus on a plant based diet and general nutrition during cancer was given to the patient.  As a part of the continuum of care the cancer dietitian's contact information was given to the patient in the event they would like to have a follow up appointment.

## 2015-02-20 NOTE — Telephone Encounter (Signed)
s.w pt and advised on May appt.....pt ok and aware °

## 2015-02-20 NOTE — Progress Notes (Signed)
Checked in new patient with no issues prior to seeing the dr. She has not traveled and was called so no [facility] packet. She has appt card.

## 2015-02-20 NOTE — Therapy (Signed)
[place], [place], [address] Phone: [phone]   Fax:  [phone]  Physical Therapy Evaluation  Patient Details  Name: Cheryl [name] MRN: [id number] Date of Birth: 1975/12/13 Referring Provider:  [name], MD  Encounter Date: 02/20/2015      PT End of Session - 02/20/15 1105    Visit Number 1   Number of Visits 1   PT Start Time 0945   PT Stop Time 1005   PT Time Calculation (min) 20 min   Activity Tolerance Patient tolerated treatment well   Behavior During Therapy [facility] for tasks assessed/performed      Past Medical History  Diagnosis Date  . Breast cancer     History reviewed. No pertinent past surgical history.  There were no vitals filed for this visit.  Visit Diagnosis:  Carcinoma of upper-outer quadrant of left female breast - Plan: PT plan of care cert/re-cert  Abnormal posture - Plan: PT plan of care cert/re-cert      Subjective Assessment - 02/20/15 1057    Symptoms Patient was seen today for a baseline assessment of her newly diagnosed left breast cancer.   Pertinent History Diagnosed 02/12/15 with left ER/PR positive, HER2 negative upper outer breast cancer.  Mass measures 4.2 cm and is DCIS with possible invasive disease.   Patient Stated Goals Reduce lymphedema risk and learn post op shoulder ROM HEP   Currently in Pain? No/denies            [facility] PT Assessment - 02/20/15 0001    Assessment   Medical Diagnosis Left breast   Onset Date 02/12/15   Precautions   Precautions Other (comment)  Active cancer   Restrictions   Weight Bearing Restrictions No   Balance Screen   Has the patient fallen in the past 6 months No   Has the patient had a decrease in activity level because of a fear of falling?  No   Is the patient reluctant to leave their home because of a fear of falling?  No   Home Environment   Living Enviornment Private residence   Living  Arrangements Spouse/significant other;Children  Husband, 43 and 71 y.o. daughters   Available Help at Discharge Family   Prior Function   Level of Independence Independent with basic ADLs   Vocation Full time employment  Customer Service   Vocation Requirements desk work, some lifting but not necessary to lift   Leisure She does not exercise   Cognition   Overall Cognitive Status Within Functional Limits for tasks assessed   Posture/Postural Control   Posture/Postural Control Postural limitations   Postural Limitations Rounded Shoulders;Forward head   ROM / Strength   AROM / PROM / Strength AROM;Strength   AROM   AROM Assessment Site Shoulder   Right/Left Shoulder Right;Left   Right Shoulder Extension 51 Degrees   Right Shoulder Flexion 145 Degrees   Right Shoulder ABduction 169 Degrees   Right Shoulder Internal Rotation 68 Degrees   Right Shoulder External Rotation 73 Degrees   Left Shoulder Extension 45 Degrees   Left Shoulder Flexion 153 Degrees   Left Shoulder ABduction 142 Degrees   Left Shoulder Internal Rotation 62 Degrees   Left Shoulder External Rotation 75 Degrees   Strength   Overall Strength Within functional limits for tasks performed           LYMPHEDEMA/ONCOLOGY QUESTIONNAIRE - 02/20/15 1101    Type   Cancer Type Left breast  Lymphedema Assessments   Lymphedema Assessments Upper extremities   Right Upper Extremity Lymphedema   10 cm Proximal to Olecranon Process 35.4 cm   Olecranon Process 28 cm   10 cm Proximal to Ulnar Styloid Process 26.3 cm   Just Proximal to Ulnar Styloid Process 16.6 cm   Across Hand at [employer] 19.5 cm   At [place] of 2nd Digit 6.4 cm   Left Upper Extremity Lymphedema   10 cm Proximal to Olecranon Process 36 cm   Olecranon Process 27.9 cm   10 cm Proximal to Ulnar Styloid Process 25.9 cm   Just Proximal to Ulnar Styloid Process 16.5 cm   Across Hand at [employer] 19.5 cm   At [place] of 2nd Digit 6.4 cm        Patient was instructed today in a home exercise program today for post op shoulder range of motion. These included active assist shoulder flexion in sitting, scapular retraction, wall walking with shoulder abduction, and hands behind head external rotation.  She was encouraged to do these twice a day, holding 3 seconds and repeating 5 times when permitted by her physician.         PT Education - 02/20/15 1104    Education provided Yes   Education Details Post op shoulder ROM HEP and lymphedema risk reduction   Person(s) Educated Patient   Methods Explanation;Demonstration;Handout   Comprehension Verbalized understanding              Breast Clinic Goals - 02/20/15 1111    Patient will be able to verbalize understanding of pertinent lymphedema risk reduction practices relevant to her diagnosis specifically related to skin care.   Time 1   Period Days   Status Achieved   Patient will be able to return demonstrate and/or verbalize understanding of the post-op home exercise program related to regaining shoulder range of motion.   Time 1   Period Days   Status Achieved   Patient will be able to verbalize understanding of the importance of attending the postoperative After Breast Cancer Class for further lymphedema risk reduction education and therapeutic exercise.   Time 1   Period Days   Status Achieved              Plan - 02/20/15 1105    Clinical Impression Statement Patient is a 39 y.o. woman diagnosed with left breast cancer on 02/12/15.  [name] planning to have a left lumpectomy with sentinel node biopsy followed by radiation and anti-estrogen therapy.  She will likely benefit from PT post operatively to regain shoulder ROM and strength and reduce lympehdema risk.   Pt will benefit from skilled therapeutic intervention in order to improve on the following deficits Decreased range of motion;Increased edema;Decreased knowledge of precautions;Impaired UE functional  use;Pain;Decreased strength   Rehab Potential Good   Clinical Impairments Affecting Rehab Potential none   PT Frequency One time visit   PT Treatment/Interventions Patient/family education;Therapeutic exercise   Consulted and Agree with Plan of Care Patient     Patient will follow up at outpatient cancer rehab if needed following surgery.  If the patient requires physical therapy at that time, a specific plan will be dictated and sent to the referring physician for approval. The patient was educated today on appropriate basic range of motion exercises to begin post operatively and the importance of attending the After Breast Cancer class following surgery.  Patient was educated today on lymphedema risk reduction practices as it  pertains to recommendations that will benefit the patient immediately following surgery.  She verbalized good understanding.  No additional physical therapy is indicated at this time.       Problem List Patient Active Problem List   Diagnosis Date Noted  . Breast cancer of upper-outer quadrant of left female breast 02/13/2015    Annia Friendly, PT 02/20/2015, 11:13 AM  [place], [place], [address] Phone: [phone]   Fax:  [phone]

## 2015-02-20 NOTE — Progress Notes (Signed)
[name] is a very pleasant 39 y.o. female from [place], [place] with newly diagnosed grade 2-3 DCIS of the left breast.  Biopsy results revealed the tumor's hormone status as ER positive and PR positive.   She presents today with her husband and daughters to the [facility]) for treatment consideration and recommendations from the breast surgeon, radiation oncologist, and medical oncologist.     I briefly met with [name] and her family during her [facility] visit today. We discussed the purpose of the Survivorship Clinic, which will include monitoring for recurrence, coordinating completion of age and gender-appropriate cancer screenings, promotion of overall wellness, as well as managing potential late/long-term side effects of anti-cancer treatments.    The treatment plan for [name] will likely include surgery, radiation therapy, and anti-estrogen therapy.  She will meet with the Genetics Counselor due to her age at diagnosis. As of today, the intent of treatment for [name] is cure, therefore she will be eligible for the Survivorship Clinic upon her completion of treatment.  Her survivorship care plan (SCP) document will be drafted and updated throughout the course of her treatment trajectory. She will receive the SCP in an office visit with myself in the Survivorship Clinic once she has completed treatment.   [name] was encouraged to ask questions and all questions were answered to her satisfaction.  She was given my business card and encouraged to contact me with any concerns regarding survivorship.  I look forward to participating in her care.   Mike Craze, NP [place] [phone]

## 2015-02-20 NOTE — Patient Instructions (Signed)

## 2015-02-20 NOTE — Progress Notes (Unsigned)
[place] Psychosocial Distress Screening Spiritual Care  Visited with Ms [name], her two teenaged daughters, and her husband at [facility] to introduce [place] team/resources, and to review distress screen per protocol.  The patient scored a 5 on the Psychosocial Distress Thermometer which indicates moderate distress. Also assessed for distress and other psychosocial needs.   ONCBCN DISTRESS SCREENING 02/20/2015  Screening Type Initial Screening  Distress experienced in past week (1-10) 5  Practical problem type Work/school;Childcare  Emotional problem type Adjusting to illness  Referral to support programs Yes  Other Spiritual Care   One of pt's top concerns is telling her 9yo daughter, which she has been waiting to do until she learned more about dx/tx, and managing care for her while pt has appointments/tx.  Her other main concern is missing work.  Per pt, she has a close friend at work, where she as been for 9 years, who had breast cancer and is serving as a Corporate treasurer.  (For this reason, she declined [employer] at this time.) Pt states that her distress has decreased to 1 after [place].  Provided pastoral presence, reflective listening, introduction to/print material about Support Center resources, and opportunity for her to share and process about securing care (logistical, plus emotional at school) for her younger daughter.    Follow up needed: No.  Plan to send handwritten note of encouragement/care.  [place], [place]

## 2015-02-21 ENCOUNTER — Encounter: Payer: Self-pay | Admitting: Adult Health

## 2015-02-21 ENCOUNTER — Ambulatory Visit (HOSPITAL_BASED_OUTPATIENT_CLINIC_OR_DEPARTMENT_OTHER): Payer: Federal, State, Local not specified - PPO | Admitting: Genetic Counselor

## 2015-02-21 ENCOUNTER — Other Ambulatory Visit: Payer: Self-pay | Admitting: *Deleted

## 2015-02-21 ENCOUNTER — Other Ambulatory Visit: Payer: Federal, State, Local not specified - PPO

## 2015-02-21 DIAGNOSIS — C50412 Malignant neoplasm of upper-outer quadrant of left female breast: Secondary | ICD-10-CM

## 2015-02-21 DIAGNOSIS — Z808 Family history of malignant neoplasm of other organs or systems: Secondary | ICD-10-CM | POA: Diagnosis not present

## 2015-02-21 DIAGNOSIS — C50912 Malignant neoplasm of unspecified site of left female breast: Secondary | ICD-10-CM

## 2015-02-21 DIAGNOSIS — Z801 Family history of malignant neoplasm of trachea, bronchus and lung: Secondary | ICD-10-CM | POA: Diagnosis not present

## 2015-02-21 DIAGNOSIS — C50919 Malignant neoplasm of unspecified site of unspecified female breast: Secondary | ICD-10-CM | POA: Insufficient documentation

## 2015-02-21 MED ORDER — TAMOXIFEN CITRATE 20 MG PO TABS: 20.0000 mg | ORAL_TABLET | Freq: Every day | ORAL | Status: DC

## 2015-02-21 NOTE — Progress Notes (Signed)
I received the following email from Ms. [name] today.  Below is a copy of the email:  Good Morning Naomia Lenderman,  I met with you yesterday and you are one of the names and faces I remember, so you're the lucky one I will bother with my questions.Marland Kitchenlol I actually am having a genectics test done today at 12. How do I know if my insurance covers this? I've heard its very expensive.. Hope you can point me in the right direction.Katherine Basset, Customer Care  [email or web address]  [employer]  [place] [address] [phone]   ------------------------------------------------------------ I replied to Ms. Susann Givens questions by offering her reassurance that the Genetics Counselor would review her financial/insurance concerns regarding testing at her appointment today.  I encouraged her to reach out to me again at any time for help with any other questions or concerns.   Mike Craze, NP [place] [phone]

## 2015-02-21 NOTE — Progress Notes (Signed)
[facility] Patient Visit  REFERRING PROVIDER: Dr. Lurline Del  PRIMARY PROVIDER:  No PCP Per Patient  PRIMARY REASON FOR VISIT:  1. Breast CA, left     HISTORY OF PRESENT ILLNESS:   Cheryl [name], a 39 y.o. female, was seen for a [facility] cancer genetics consultation at the request of Dr. Jana Hakim due to a personal history of breast cancer.  Cheryl [name] presents to clinic today to discuss the possibility of a hereditary predisposition to cancer, genetic testing, and to further clarify her future cancer risks, as well as potential cancer risks for family members. She would like to use genetic test results for surgical decisions regarding her diagnosis.   CANCER HISTORY:     Breast cancer of upper-outer quadrant of left female breast   02/08/2015 Receptors her2 Estrogen Receptor: 100%, POSITIVE, STRONG STAINING INTENSITY Progesterone Receptor: 100%, POSITIVE, STRONG STAINING INTENSIT   02/08/2015 Pathology Results Breast, left, needle core biopsy, upper outer - AT LEAST DUCTAL CARCINOMA IN SITU,   02/13/2015 Initial Diagnosis Breast cancer of upper-outer quadrant of left female breast   02/15/2015 Breast MRI upper-outer left breast measures up to 4.5 cm, which includes the large area of non mass enhancement, 1 cm spiculated mass, and associated hematoma;Indeterminate oval mass in the central to slightly medial left;Indeterminate mass in the slightly outer rig   02/18/2015 Pathology Results Breast, right, needle core biopsy, 9 o'clock - FIBROADENOMA. - THERE IS NO EVIDENCE OF MALIGNANCY. Breast, left, needle core biopsy, 10 o'clock - FIBROADENOMA. - THERE IS NO EVIDENCE OF MALIGNANCY;Breast, left, needle core biopsy, 2 o'clock - PSEUDOANGIO   02/18/2015 Breast US 10 o'clock position of the left breast;measuring 1.1 x 1.1 x 1.0 cm: 2 o'clock position of the left breast; 1.0 x 0.6 x 0.5 cm irregular hypoechoic area     Past Medical History  Diagnosis  Date   Breast cancer     No past surgical history on file.  History   Social History   Marital Status: Married    Spouse Name: N/A   Number of Children: N/A   Years of Education: N/A   Social History Main Topics   Smoking status: Never Smoker    Smokeless tobacco: Not on file   Alcohol Use: Yes   Drug Use: No   Sexual Activity: Not on file   Other Topics Concern   Not on file   Social History Narrative     FAMILY HISTORY:  During the visit, a 4-generation pedigree was obtained. A copy of the pedigree with be scanned into Epic under the Media tab. Significant family history diagnoses include the following: Family History  Problem Relation Age of Onset   Lung cancer Maternal Grandmother 37    non smoker   Lung cancer Maternal Grandfather 31    non smoker worked in [place] Paternal Grandfather 35    throat cancer ? smoker   Cancer Cousin 41    throat cancer ? smoker   Cheryl [name] ancestry is of Caucasian descent. There is no known Jewish ancestry or consanguinity.  GENETIC COUNSELING ASSESSMENT:  Cheryl [name] is a 39 y.o. female with a personal history of breast cancer diagnosed at age 75, which is suggestive of a hereditary predisposition to cancer. We, therefore, discussed and recommended the following at today's visit.   DISCUSSION:  We reviewed the characteristics, features and inheritance patterns of hereditary cancer syndromes. We also discussed genetic testing, including  the appropriate family members to test, the process of testing, insurance coverage and turn-around-time for results. We discussed the implications of a negative, positive and/or variant of uncertain significant result. We recommended Cheryl [name] pursue genetic testing for the BRCAPlus gene panel. This panel looks for mutations in the following 6 highly penetrant genes: BRCA1, BRCA2, CDH1, PALB2, PTEN and TP53. If this testing is negative, we recommended she pursue reflex  genetic testing for the BreastNext gene panel. The BreastNext gene panel offered by [employer] includes sequencing and rearrangement analysis for the following 17 genes: ATM, BARD1, BRCA1, BRCA2, BRIP1, CDH1, CHEK2, MRE11A, MUTYH, NBN, NF1, PALB2, PTEN, RAD50, RAD51C, RAD51D, and TP53.  PLAN:  Based on our above recommendation, Cheryl [name] wished to pursue genetic testing and the blood sample was drawn and will be sent to [facility] for analysis of the BRCAPlus gene panel. These results should be available within approximately 2 weeks time, at which point they will be disclosed by telephone to Cheryl [name], as will any additional recommendations warranted by these results. If reflex testing for the BreastNext gene panel is pursued, this test will take an additional 2 weeks and they will be disclosed to Cheryl [name] once available. Lastly, we encouraged Cheryl [name] to remain in contact with cancer genetics annually so that we can continuously update the family history and inform her of any changes in cancer genetics and testing that may be of benefit for this family.   [name] questions were answered to her satisfaction today. Our contact information was provided should additional questions or concerns arise. Thank you for the referral and allowing [place] to share in the care of your patient.   Catherine A. Fine, MS, CGC Certified Psychologist, [email or web address] phone: [phone]  The patient was seen for a total of 40 minutes in face-to-face genetic counseling.  This patient was discussed with Dr. Jana Hakim who agrees with the above.    ______________________________________________________________________ For Office Staff:  Number of people involved in session including genetic counselor: 2 Was an intern or student involved with case: not applicable

## 2015-02-25 ENCOUNTER — Telehealth: Payer: Self-pay | Admitting: *Deleted

## 2015-02-25 NOTE — Telephone Encounter (Signed)
Spoke to pt concerning [facility] [place] from 02/20/15. Denies questions or concerns regarding dx or treatment care plan. Encourage pt to call with needs. Received verbal understanding. Contact information given.

## 2015-03-12 ENCOUNTER — Encounter: Payer: Self-pay | Admitting: Genetic Counselor

## 2015-03-12 DIAGNOSIS — C50912 Malignant neoplasm of unspecified site of left female breast: Secondary | ICD-10-CM

## 2015-03-12 NOTE — Progress Notes (Signed)
[name] recently had cancer genetic counseling at [facility] on 02/21/2015. At that time, it was recommended she pursue genetic testing. Her BRCAplus gene panel, which was performed at [facility] and looks for mutations in the BRCA1, BRCA2, CDH1, PTEN, PALB2 and TP53 genes, has returned and is negative for mutations. These results were disclosed to her today. Per her request, reflex testing for the BreastNext gene panel at [facility] was initiated. The BreastNext gene panel offered by [employer] includes sequencing and rearrangement analysis for the following 17 genes (6 of which have been completed as mentioned): ATM, BARD1, BRCA1, BRCA2, BRIP1, CDH1, CHEK2, MRE11A, MUTYH, NBN, NF1, PALB2, PTEN, RAD50, RAD51C, RAD51D, and TP53. Results for the gene panel should be available in 2-3 more weeks and we will contact her to discuss these results and recommendations warranted by these results.

## 2015-03-13 ENCOUNTER — Other Ambulatory Visit: Payer: Self-pay | Admitting: General Surgery

## 2015-03-13 DIAGNOSIS — C50912 Malignant neoplasm of unspecified site of left female breast: Secondary | ICD-10-CM

## 2015-03-15 ENCOUNTER — Other Ambulatory Visit: Payer: Self-pay | Admitting: General Surgery

## 2015-03-15 DIAGNOSIS — C50912 Malignant neoplasm of unspecified site of left female breast: Secondary | ICD-10-CM

## 2015-03-25 ENCOUNTER — Encounter (HOSPITAL_BASED_OUTPATIENT_CLINIC_OR_DEPARTMENT_OTHER): Payer: Self-pay | Admitting: *Deleted

## 2015-03-25 ENCOUNTER — Encounter: Payer: Self-pay | Admitting: Adult Health

## 2015-03-25 NOTE — Progress Notes (Signed)
No new labs needed 

## 2015-03-25 NOTE — Progress Notes (Signed)
I received the following email from Ms. [name]:   Doren Custard,  I hate to bother you with this, but can you give me the genetic counselors names and contact info. Your card is the only one I keep on my desk, so you are the one I will harass lol   Thank-You , [name], Customer Care  [email or web address]  [employer]  [place] [address] [phone]  --------------------------- Below is my reply to her inquiry:   Hi there, You are not bothering me at all!  Feel free to reach out anytime.    It looks like you met with Leda Quail on 02/21/15.  Her number is [phone].  Roma Kayser is another one of the genetic counselors.  They all share a phone, so the number is the same.  Hope this helps and hope you're doing well!    Thanks so much, Eldred Manges, MSN, RN, AGPCNP-BC, AOCNP Advanced Oncology Certified Nurse Practitioner [place]  [address], [place] [address] Office: [phone] Pager: [phone] [email or web address]

## 2015-03-26 ENCOUNTER — Telehealth: Payer: Self-pay | Admitting: Genetic Counselor

## 2015-03-26 NOTE — Telephone Encounter (Signed)
Cheryl [name] on my VM looking for additional test results.  Asked that we call back to let her know the status of the results.  I will contact Barnetta Chapel Fine to have her reach out to patient.

## 2015-03-27 ENCOUNTER — Ambulatory Visit
Admission: RE | Admit: 2015-03-27 | Discharge: 2015-03-27 | Disposition: A | Discharge status: Home / Self Care | Payer: Federal, State, Local not specified - PPO | Source: Ambulatory Visit | Attending: General Surgery | Admitting: General Surgery

## 2015-03-27 ENCOUNTER — Inpatient Hospital Stay: Admission: RE | Admit: 2015-03-27 | Payer: Federal, State, Local not specified - PPO | Source: Ambulatory Visit

## 2015-03-27 ENCOUNTER — Encounter (HOSPITAL_BASED_OUTPATIENT_CLINIC_OR_DEPARTMENT_OTHER): Payer: Self-pay | Admitting: *Deleted

## 2015-03-27 ENCOUNTER — Ambulatory Visit (HOSPITAL_COMMUNITY)
Admission: RE | Admit: 2015-03-27 | Discharge: 2015-03-27 | Disposition: A | Discharge status: Home / Self Care | Payer: Federal, State, Local not specified - PPO | Source: Ambulatory Visit | Attending: General Surgery | Admitting: General Surgery

## 2015-03-27 ENCOUNTER — Ambulatory Visit (HOSPITAL_BASED_OUTPATIENT_CLINIC_OR_DEPARTMENT_OTHER)
Admission: RE | Admit: 2015-03-27 | Discharge: 2015-03-27 | Disposition: A | Discharge status: Home / Self Care | Payer: Federal, State, Local not specified - PPO | Source: Ambulatory Visit | Attending: General Surgery | Admitting: General Surgery

## 2015-03-27 ENCOUNTER — Ambulatory Visit (HOSPITAL_BASED_OUTPATIENT_CLINIC_OR_DEPARTMENT_OTHER): Payer: Federal, State, Local not specified - PPO | Admitting: Anesthesiology

## 2015-03-27 ENCOUNTER — Encounter (HOSPITAL_BASED_OUTPATIENT_CLINIC_OR_DEPARTMENT_OTHER)
Admission: RE | Disposition: A | Discharge status: Home / Self Care | Payer: Self-pay | Source: Ambulatory Visit | Attending: General Surgery

## 2015-03-27 DIAGNOSIS — C50912 Malignant neoplasm of unspecified site of left female breast: Secondary | ICD-10-CM | POA: Diagnosis present

## 2015-03-27 DIAGNOSIS — C50412 Malignant neoplasm of upper-outer quadrant of left female breast: Secondary | ICD-10-CM | POA: Diagnosis not present

## 2015-03-27 DIAGNOSIS — K219 Gastro-esophageal reflux disease without esophagitis: Secondary | ICD-10-CM | POA: Diagnosis not present

## 2015-03-27 HISTORY — PX: BREAST LUMPECTOMY: SHX2

## 2015-03-27 HISTORY — PX: BREAST LUMPECTOMY WITH NEEDLE LOCALIZATION AND AXILLARY SENTINEL LYMPH NODE BX: SHX5760

## 2015-03-27 HISTORY — DX: Presence of spectacles and contact lenses: Z97.3

## 2015-03-27 LAB — POCT HEMOGLOBIN-HEMACUE: Hemoglobin: 14.4 g/dL (ref 12.0–15.0)

## 2015-03-27 SURGERY — BREAST LUMPECTOMY WITH NEEDLE LOCALIZATION AND AXILLARY SENTINEL LYMPH NODE BX
Anesthesia: General | Laterality: Left

## 2015-03-27 MED ORDER — BUPIVACAINE-EPINEPHRINE 0.5% -1:200000 IJ SOLN
INTRAMUSCULAR | Status: DC | PRN
  Administered 2015-03-27: 14 mL

## 2015-03-27 MED ORDER — CHLORHEXIDINE GLUCONATE 4 % EX LIQD: 1.0000 "application " | Freq: Once | CUTANEOUS | Status: DC

## 2015-03-27 MED ORDER — HYDROMORPHONE HCL 1 MG/ML IJ SOLN
INTRAMUSCULAR | Status: AC
  Filled 2015-03-27: qty 1

## 2015-03-27 MED ORDER — ONDANSETRON HCL 4 MG/2ML IJ SOLN
INTRAMUSCULAR | Status: DC | PRN
  Administered 2015-03-27: 4 mg via INTRAVENOUS

## 2015-03-27 MED ORDER — FENTANYL CITRATE (PF) 100 MCG/2ML IJ SOLN
INTRAMUSCULAR | Status: DC | PRN
  Administered 2015-03-27 (×4): 50 ug via INTRAVENOUS

## 2015-03-27 MED ORDER — LACTATED RINGERS IV SOLN
INTRAVENOUS | Status: DC
Start: 2015-03-27 — End: 2015-03-27
  Administered 2015-03-27 (×2): via INTRAVENOUS

## 2015-03-27 MED ORDER — OXYCODONE-ACETAMINOPHEN 5-325 MG PO TABS
ORAL_TABLET | ORAL | Status: AC
  Filled 2015-03-27: qty 1

## 2015-03-27 MED ORDER — CEFAZOLIN SODIUM-DEXTROSE 2-3 GM-% IV SOLR
2.0000 g | INTRAVENOUS | Status: AC
  Administered 2015-03-27 (×2): 2 g via INTRAVENOUS

## 2015-03-27 MED ORDER — FENTANYL CITRATE (PF) 100 MCG/2ML IJ SOLN
INTRAMUSCULAR | Status: AC
  Filled 2015-03-27: qty 2

## 2015-03-27 MED ORDER — ONDANSETRON HCL 4 MG/2ML IJ SOLN: 4.0000 mg | Freq: Once | INTRAMUSCULAR | Status: DC | PRN

## 2015-03-27 MED ORDER — BUPIVACAINE-EPINEPHRINE (PF) 0.5% -1:200000 IJ SOLN
INTRAMUSCULAR | Status: DC | PRN
  Administered 2015-03-27: 30 mL via PERINEURAL

## 2015-03-27 MED ORDER — METHYLENE BLUE 1 % INJ SOLN
INTRAMUSCULAR | Status: AC
  Filled 2015-03-27: qty 10

## 2015-03-27 MED ORDER — OXYCODONE-ACETAMINOPHEN 5-325 MG PO TABS: 1.0000 | ORAL_TABLET | ORAL | Status: DC | PRN

## 2015-03-27 MED ORDER — FENTANYL CITRATE (PF) 100 MCG/2ML IJ SOLN: INTRAMUSCULAR | Status: DC | PRN

## 2015-03-27 MED ORDER — MIDAZOLAM HCL 2 MG/2ML IJ SOLN
INTRAMUSCULAR | Status: AC
  Filled 2015-03-27: qty 2

## 2015-03-27 MED ORDER — MEPERIDINE HCL 25 MG/ML IJ SOLN
6.2500 mg | INTRAMUSCULAR | Status: DC | PRN
Start: 2015-03-27 — End: 2015-03-27

## 2015-03-27 MED ORDER — BUPIVACAINE-EPINEPHRINE (PF) 0.25% -1:200000 IJ SOLN
INTRAMUSCULAR | Status: AC
  Filled 2015-03-27: qty 30

## 2015-03-27 MED ORDER — HYDROMORPHONE HCL 1 MG/ML IJ SOLN
0.2500 mg | INTRAMUSCULAR | Status: DC | PRN
  Administered 2015-03-27 (×4): 0.5 mg via INTRAVENOUS

## 2015-03-27 MED ORDER — OXYCODONE-ACETAMINOPHEN 5-325 MG PO TABS
1.0000 | ORAL_TABLET | ORAL | Status: DC | PRN
  Administered 2015-03-27: 1 via ORAL

## 2015-03-27 MED ORDER — MIDAZOLAM HCL 2 MG/2ML IJ SOLN
1.0000 mg | INTRAMUSCULAR | Status: DC | PRN
  Administered 2015-03-27 (×2): 1 mg via INTRAVENOUS

## 2015-03-27 MED ORDER — METHYLENE BLUE 1 % INJ SOLN
INTRAMUSCULAR | Status: DC | PRN
  Administered 2015-03-27: 12:00:00

## 2015-03-27 MED ORDER — CEFAZOLIN SODIUM-DEXTROSE 2-3 GM-% IV SOLR
INTRAVENOUS | Status: AC
  Filled 2015-03-27: qty 50

## 2015-03-27 MED ORDER — BUPIVACAINE-EPINEPHRINE (PF) 0.5% -1:200000 IJ SOLN
INTRAMUSCULAR | Status: AC
  Filled 2015-03-27: qty 30

## 2015-03-27 MED ORDER — TECHNETIUM TC 99M SULFUR COLLOID FILTERED
1.0000 | Freq: Once | INTRAVENOUS | Status: AC | PRN
  Administered 2015-03-27: 1 via INTRADERMAL

## 2015-03-27 MED ORDER — SODIUM CHLORIDE 0.9 % IJ SOLN
INTRAMUSCULAR | Status: AC
  Filled 2015-03-27: qty 10

## 2015-03-27 MED ORDER — FENTANYL CITRATE (PF) 100 MCG/2ML IJ SOLN
50.0000 ug | INTRAMUSCULAR | Status: DC | PRN
Start: 2015-03-27 — End: 2015-03-27
  Administered 2015-03-27 (×2): 50 ug via INTRAVENOUS

## 2015-03-27 MED ORDER — PROPOFOL 10 MG/ML IV BOLUS
INTRAVENOUS | Status: DC | PRN
  Administered 2015-03-27: 200 mg via INTRAVENOUS

## 2015-03-27 MED ORDER — DEXAMETHASONE SODIUM PHOSPHATE 4 MG/ML IJ SOLN
INTRAMUSCULAR | Status: DC | PRN
  Administered 2015-03-27: 10 mg via INTRAVENOUS

## 2015-03-27 MED ORDER — LIDOCAINE HCL (CARDIAC) 20 MG/ML IV SOLN
INTRAVENOUS | Status: DC | PRN
  Administered 2015-03-27: 100 mg via INTRAVENOUS

## 2015-03-27 MED ORDER — FENTANYL CITRATE (PF) 100 MCG/2ML IJ SOLN
INTRAMUSCULAR | Status: AC
  Filled 2015-03-27: qty 6

## 2015-03-27 SURGICAL SUPPLY — 51 items
APPLIER CLIP 11 MED OPEN (CLIP) ×2
BINDER BREAST XLRG (GAUZE/BANDAGES/DRESSINGS) ×2 IMPLANT
BLADE SURG 10 STRL SS (BLADE) ×2 IMPLANT
BLADE SURG 15 STRL LF DISP TIS (BLADE) ×1 IMPLANT
BLADE SURG 15 STRL SS (BLADE) ×1
CANISTER SUCT 1200ML W/VALVE (MISCELLANEOUS) ×2 IMPLANT
CHLORAPREP W/TINT 26ML (MISCELLANEOUS) ×2 IMPLANT
CLIP APPLIE 11 MED OPEN (CLIP) ×1 IMPLANT
CLIP TI WIDE RED SMALL 6 (CLIP) IMPLANT
COVER BACK TABLE 60X90IN (DRAPES) ×2 IMPLANT
COVER MAYO STAND STRL (DRAPES) ×2 IMPLANT
COVER PROBE W GEL 5X96 (DRAPES) ×2 IMPLANT
DECANTER SPIKE VIAL GLASS SM (MISCELLANEOUS) IMPLANT
DEVICE DUBIN W/COMP PLATE 8390 (MISCELLANEOUS) ×2 IMPLANT
DRAPE UTILITY XL STRL (DRAPES) ×2 IMPLANT
DRSG PAD ABDOMINAL 8X10 ST (GAUZE/BANDAGES/DRESSINGS) ×2 IMPLANT
ELECT COATED BLADE 2.86 ST (ELECTRODE) ×2 IMPLANT
ELECT REM PT RETURN 9FT ADLT (ELECTROSURGICAL) ×2
ELECTRODE REM PT RTRN 9FT ADLT (ELECTROSURGICAL) ×1 IMPLANT
GLOVE BIO SURGEON STRL SZ 6.5 (GLOVE) ×2 IMPLANT
GLOVE BIOGEL PI IND STRL 7.0 (GLOVE) ×1 IMPLANT
GLOVE BIOGEL PI IND STRL 8 (GLOVE) ×1 IMPLANT
GLOVE BIOGEL PI INDICATOR 7.0 (GLOVE) ×1
GLOVE BIOGEL PI INDICATOR 8 (GLOVE) ×1
GLOVE ECLIPSE 7.5 STRL STRAW (GLOVE) ×2 IMPLANT
GLOVE SURG SS PI 7.0 STRL IVOR (GLOVE) ×2 IMPLANT
GOWN STRL REUS W/ TWL LRG LVL3 (GOWN DISPOSABLE) ×1 IMPLANT
GOWN STRL REUS W/ TWL XL LVL3 (GOWN DISPOSABLE) ×1 IMPLANT
GOWN STRL REUS W/TWL LRG LVL3 (GOWN DISPOSABLE) ×1
GOWN STRL REUS W/TWL XL LVL3 (GOWN DISPOSABLE) ×1
KIT MARKER MARGIN INK (KITS) ×2 IMPLANT
LIQUID BAND (GAUZE/BANDAGES/DRESSINGS) ×2 IMPLANT
NDL SAFETY ECLIPSE 18X1.5 (NEEDLE) ×1 IMPLANT
NEEDLE HYPO 18GX1.5 SHARP (NEEDLE) ×1
NEEDLE HYPO 25X1 1.5 SAFETY (NEEDLE) ×2 IMPLANT
NS IRRIG 1000ML POUR BTL (IV SOLUTION) ×2 IMPLANT
PACK BASIN DAY SURGERY FS (CUSTOM PROCEDURE TRAY) ×2 IMPLANT
PAD ALCOHOL SWAB (MISCELLANEOUS) ×2 IMPLANT
PENCIL BUTTON HOLSTER BLD 10FT (ELECTRODE) ×2 IMPLANT
SLEEVE SCD COMPRESS KNEE MED (MISCELLANEOUS) ×2 IMPLANT
SPONGE LAP 18X18 X RAY DECT (DISPOSABLE) IMPLANT
SPONGE LAP 4X18 X RAY DECT (DISPOSABLE) ×2 IMPLANT
SUT MON AB 4-0 PC3 18 (SUTURE) ×4 IMPLANT
SUT MON AB 5-0 PS2 18 (SUTURE) IMPLANT
SUT SILK 3 0 SH 30 (SUTURE) IMPLANT
SUT VICRYL 3-0 CR8 SH (SUTURE) ×4 IMPLANT
SYR CONTROL 10ML LL (SYRINGE) ×2 IMPLANT
TOWEL OR 17X24 6PK STRL BLUE (TOWEL DISPOSABLE) ×2 IMPLANT
TOWEL OR NON WOVEN STRL DISP B (DISPOSABLE) ×2 IMPLANT
TUBE CONNECTING 20X1/4 (TUBING) ×2 IMPLANT
YANKAUER SUCT BULB TIP NO VENT (SUCTIONS) ×2 IMPLANT

## 2015-03-27 NOTE — Anesthesia Postprocedure Evaluation (Signed)
Anesthesia Post Note  Patient: Cheryl [name]  Procedure(s) Performed: Procedure(s) (LRB): LEFT BREAST LUMPECTOMY WITH BRACKETED NEEDLE LOCALIZATION AND LEFT  AXILLARY SENTINEL LYMPH NODE BX (Left)  Anesthesia type: general  Patient location: PACU  Post pain: Pain level controlled  Post assessment: Patient's Cardiovascular Status Stable  Last Vitals:  Filed Vitals:   03/27/15 1445  BP: 126/78  Pulse: 85  Temp:   Resp: 15    Post vital signs: Reviewed and stable  Level of consciousness: sedated  Complications: No apparent anesthesia complications

## 2015-03-27 NOTE — Anesthesia Procedure Notes (Addendum)
Anesthesia Regional Block:  Pectoralis block  Pre-Anesthetic Checklist: ,, timeout performed, Correct Patient, Correct Site, Correct Laterality, Correct Procedure, Correct Position, site marked, Risks and benefits discussed,  Surgical consent,  Pre-op evaluation,  At surgeon's request and post-op pain management  Laterality: Left  Prep: chloraprep       Needles:  Injection technique: Single-shot     Needle Length: 9cm 9 cm Needle Gauge: 21 and 21 G    Additional Needles:  Procedures: ultrasound guided (picture in chart) Pectoralis block Narrative:  Start time: 03/27/2015 10:20 AM End time: 03/27/2015 10:30 AM Injection made incrementally with aspirations every 5 mL.  Performed by: Personally  Anesthesiologist: Lillia Abed  Additional Notes: Monitors applied. Patient sedated. Sterile prep and drape,hand hygiene and sterile gloves were used. Relevant anatomy identified.Needle position confirmed.Local anesthetic injected incrementally after negative aspiration. Local anesthetic spread visualized. Vascular puncture avoided. No complications. Image printed for medical record.The patient tolerated the procedure well.       Procedure Name: LMA Insertion Date/Time: 03/27/2015 12:11 PM Performed by: Melynda Ripple D Pre-anesthesia Checklist: Patient identified, Emergency Drugs available, Suction available and Patient being monitored Patient Re-evaluated:Patient Re-evaluated prior to inductionOxygen Delivery Method: Circle System Utilized Preoxygenation: Pre-oxygenation with 100% oxygen Intubation Type: IV induction Ventilation: Mask ventilation without difficulty LMA: LMA inserted LMA Size: 4.0 Number of attempts: 1 Airway Equipment and Method: Bite block Placement Confirmation: positive ETCO2 Tube secured with: Tape Dental Injury: Teeth and Oropharynx as per pre-operative assessment

## 2015-03-27 NOTE — Progress Notes (Signed)
Assisted Dr. Ossey with left, ultrasound guided, pectoralis block. Side rails up, monitors on throughout procedure. See vital signs in flow sheet. Tolerated Procedure well. 

## 2015-03-27 NOTE — Anesthesia Preprocedure Evaluation (Addendum)

## 2015-03-27 NOTE — H&P (Signed)
History of Present Illness Marland Kitchen T. Zethan Alfieri MD; 02/20/2015 9:52 AM) The patient is a 39 year old female who presents with breast cancer. [name] is a 39 YO pre menopausal female referred by Dr. Enrique Sack for evaluation of recently diagnosed carcinoma of the left breast. she had an initial screening mammogram about 5 years ago at age 6. this was negative. She recently presented for a screening mamogram revealing a new area of suspicious calcifications in the upper outer left breast measuring 4.2 cm in greatest diameter. Subsequent imaging included diagnostic mamogram confirming these findings and ultrasound showing no discrete masses. A stereotactic biopsy was performed on 02/08/2015 with pathology revealing intermediate to high grade ductal carcinoma in situ with probably areas of invasive disease as well. subsequent bilateral breast MRI was performed with findings including a 4.5 cm area of enhancement at the site which includedancy in the upper outer left breast which included a 1 cm spiculated mass which did not contain the biopsy clip. Also seen for an indeterminate oval mass in the central left breast and indeterminate mass in the outer right breast. Follow-up ultrasound and biopsy was done for these 2 masses at which time an additional left breast mass was seen and all 3 areas were biopsied. These were all benign showing a fibroadenoma at the 9:00 position of the right breast, a fibroadenoma at the 10:00 position of the left breast and pseudo-angiomatous stromal hyperplasia at 2:00 in the left breast. [name] seen now in reast cancer multidisciplinary clinic for initial treatment planning. She has experienced no breast symptoms,, specifically lump or pain or skin change or nipple discharge. She does not have a personal history of any previous breast problems. Findings at that time were the following: Tumor size: 4.5 cm Tumor grade: intermediate to high-grade Estrogen Receptor: positive  Progesterone Receptor: positive Her-2 neu: negative Lymph node status: negative   Other Problems Jeanann Lewandowsky, RMA; 02/20/2015 7:51 AM) Breast Cancer Gastroesophageal Reflux Disease Lump In Breast  Past Surgical History Jeanann Lewandowsky, RMA; 02/20/2015 7:51 AM) Breast Biopsy Bilateral.  Diagnostic Studies History Anderson Malta [place], RMA; 02/20/2015 7:51 AM) Colonoscopy never Mammogram within last year Pap Smear 1-5 years ago  Social History Anderson Malta [place], RMA; 02/20/2015 7:51 AM) Alcohol use Occasional alcohol use. Caffeine use Carbonated beverages, Coffee, Tea. No drug use Tobacco use Never smoker.  Family History Anderson Malta [place], [place]; 02/20/2015 7:51 AM) Arthritis Father. Bleeding disorder Daughter. Cancer Family Members In General. Cerebrovascular Accident Family Members In General. Diabetes Mellitus Family Members In General, Mother. Heart disease in female family member before age 6 Hypertension Family Members In General, Mother. Migraine Headache Mother. Respiratory Condition Family Members In General. Seizure disorder Family Members In General.  Pregnancy / Birth History Jeanann Lewandowsky, [place]; 02/20/2015 7:51 AM) Age at menarche 58 years. Contraceptive History Contraceptive implant, Intrauterine device, Oral contraceptives. Gravida 4 Maternal age 63-20 Para 3 Regular periods  Review of Systems Anderson Malta [place] RMA; 02/20/2015 7:51 AM) General Present- Fatigue and Night Sweats. Not Present- Appetite Loss, Chills, Fever, Weight Gain and Weight Loss. HEENT Present- Wears glasses/contact lenses. Not Present- Earache, Hearing Loss, Hoarseness, Nose Bleed, Oral Ulcers, Ringing in the Ears, Seasonal Allergies, Sinus Pain, Sore Throat, Visual Disturbances and Yellow Eyes. Respiratory Not Present- Bloody sputum, Chronic Cough, Difficulty Breathing, Snoring and Wheezing. Breast Not Present- Breast Mass, Breast Pain, Nipple Discharge and Skin  Changes. Cardiovascular Not Present- Chest Pain, Difficulty Breathing Lying Down, Leg Cramps, Palpitations, Rapid Heart Rate, Shortness of Breath and Swelling of Extremities. Gastrointestinal  Present- Indigestion. Not Present- Abdominal Pain, Bloating, Bloody Stool, Change in Bowel Habits, Chronic diarrhea, Constipation, Difficulty Swallowing, Excessive gas, Gets full quickly at meals, Hemorrhoids, Nausea, Rectal Pain and Vomiting. Female Genitourinary Not Present- Frequency, Nocturia, Painful Urination, Pelvic Pain and Urgency. Musculoskeletal Not Present- Back Pain, Joint Pain, Joint Stiffness, Muscle Pain, Muscle Weakness and Swelling of Extremities. Neurological Present- Headaches. Not Present- Decreased Memory, Fainting, Numbness, Seizures, Tingling, Tremor, Trouble walking and Weakness. Psychiatric Present- Change in Sleep Pattern. Not Present- Anxiety, Bipolar, Depression, Fearful and Frequent crying. Endocrine Present- Hot flashes. Not Present- Cold Intolerance, Excessive Hunger, Hair Changes, Heat Intolerance and New Diabetes. Hematology Not Present- Easy Bruising, Excessive bleeding, Gland problems, HIV and Persistent Infections.   Physical Exam Marland Kitchen T. Rahkeem Senft MD; 02/20/2015 9:53 AM) The physical exam findings are as follows: Note:General: Alert, mmoderately obese Caucasian female, in no distress Skin: Warm and dry without rash or infection. HEENT: No palpable masses or thyromegaly. Sclera nonicteric. Pupils equal round and reactive. Oropharynx clear. Lymph nodes: No cervical, supraclavicular, or inguinal nodes palpable. breasts: Large breasts bilaterally with mild grade 1 ptosis. I cannot feel any discrete massthy.n either breast. No palpable axillary adenopathy. Lungs: Breath sounds clear and equal. No wheezing or increased work of breathing. Cardiovascular: Regular rate and rhythm without murmer. No JVD or edema. Peripheral pulses intact. No carotid bruits. Abdomen:  Nondistended. Soft and nontender. No masses palpable. No organomegaly. No palpable hernias. Extremities: No edema or joint swelling or deformity. No chronic venous stasis changes. Neurologic: Alert and fully oriented. Gait normal. No focal weakness. Psychiatric: Normal mood and affect. Thought content appropriate with normal judgement and insight    Assessment & Plan Marland Kitchen T. Dari Carpenito MD; 02/20/2015 10:02 AM) BREAST CANCER, LEFT (174.9  C50.912) MALIGNANT NEOPLASM OF UPPER-OUTER QUADRANT OF LEFT FEMALE BREAST (174.4  C50.412) Impression: new diagnosis of approximately 4.5 cm area of at least high-grade ductal carcinoma in situ upper outer left breast with very likely invasive disease based on core biopsy and findings of a 1 cm spiculated mass associated with this area that has not been biopsied. Several other areas of abnormalities on imaging all biopsy negative. We discussed initial surgical treatment as well as adjuvant treatment in general terms. If she were genitically positive this would influence her decision and we will await her genetic test before definitive surgical planning. However if genetically negative she desires breast conservation which I think is feasible. She has a somewhat large area of tumor but in the upper outer quadrant with large breasts. We discussed radioactive seed localized lumpectomy and axillary sentinel lymph node biopsy. we discussed the nature of the surgery and expected recovery as well as risks including anesthetic complications, bleeding, infection, risk of lymphedema, and possible need for further surgery based on final pathology. All her questions were answered. Await genetic testing. Current Plans  Schedule for Surgery tentatively plan radioactive seed localized left breast lumpectomy with left axillary sentinel lymph node biopsy pending results of genetic testing.

## 2015-03-27 NOTE — Transfer of Care (Signed)
Immediate Anesthesia Transfer of Care Note  Patient: Cheryl [name]  Procedure(s) Performed: Procedure(s): LEFT BREAST LUMPECTOMY WITH BRACKETED NEEDLE LOCALIZATION AND LEFT  AXILLARY SENTINEL LYMPH NODE BX (Left)  Patient Location: PACU  Anesthesia Type:General  Level of Consciousness: awake, alert  and oriented  Airway & Oxygen Therapy: Patient Spontanous Breathing and Patient connected to face mask oxygen  Post-op Assessment: Report given to RN and Post -op Vital signs reviewed and stable  Post vital signs: Reviewed and stable  Last Vitals:  Filed Vitals:   03/27/15 1040  BP:   Pulse: 97  Temp:   Resp: 19    Complications: No apparent anesthesia complications

## 2015-03-27 NOTE — Op Note (Signed)
Preoperative Diagnosis: cancer left breast   Postoprative Diagnosis: cancer left breast   Procedure: Procedure(s): LEFT BREAST LUMPECTOMY WITH BRACKETED NEEDLE LOCALIZATION AND LEFT  AXILLARY SENTINEL LYMPH NODE BX   Surgeon: [name] T   Assistants: none  Anesthesia:  General LMA anesthesia  Indications: patient is a 39 year old female with a recent diagnosis of ductal carcinoma in situ of the left breast with a fairly large, 4.5 cm area of abnormal calcifications in the upper outer left breast. MRI has shown a separate approximately 1 cm spiculated mass within this area. After extensive consultation and following genetic testing which was negative with details documented elsewhere with electric proceed with a wire bracketed left breast lumpectomy and left axillary sentinel lymph node biopsy as her initial surgical therapy    Procedure Detail:  Following accurate bracketing of the area of calcifications the patient is brought to the holding area and under sedation 1 mCi of technetium sulfur colloid was injected intradermally around the left nipple. Patient was taken operating room, placed in the supine position on the operating table, and general LMA anesthesia induced. Under sterile technique after patient timeout I injected 5 mL of dilute methylene blue subcutaneously beneath the left nipple and massaged this for several minutes. Following this the entire breast, chest, axilla and upper arm were widely sterilely prepped and draped. She received preoperative IV antibiotics. PAS were in place. Patient timeout was again performed and correct procedure verified. The lumpectomy was approached initially. I made a curvilinear incision in the lateral 2 upper-outer left breast between the 2 wires and dissection was carried down through the subcutaneous anus tissue toward the breast capsule. The dissection was then carried anteriorly and posteriorly incorporating the 2 wires into the incision.  Using cautery I then excised a very generous portion of breast tissue surrounding the 2 wires and getting an additional approximately 2 cm area anterior to the anterior wire in order to encompass the calcifications as seen on the mammogram post-localization. Dissection was deepened down around the tips of the wires and the specimen removed. Posteriorly the specimen was taken directly off of the lateral pectoralis muscle. The fascia was taken posteriorly. The specimen was oriented with ink and specimen mammogram obtained showing the intact wires and the calcifications appeared contained within the specimen. This was sent for permanent pathology. The wound was irrigated and complete hemostasis assured. The breast and subcutaneous tissue was closed with interrupted 3-0 Vicryl after slight mobilization of breast tissue and skin closed with septic or 5-0 Monocryl. Attention was turned to the sentinel node. A hot area in the left axilla was identified and a small transverse incision made. Dissection was carried down with cautery through the subcutaneous anus tissue to the clavipectoral fascia which was incised and the axilla proper entered. Using the neoprobe for guidance I dissected down onto a blue lymphatic which was traced to a bright blue lymph node with elevated counts. This was completely excised and ex vivo had counts of about 10,000 and was sent as hot blue axillary sentinel lymph node #1. There was still moderately high count in the axilla and further dissection revealed a small node that was removed with cautery but ex vivo had minimal counts. This was sent as hot sentinel lymph node #2. Just behind this was a slightly larger but soft bright blue node which was excised and ex vivo had counts of about 5000. This was sent as hot blue sentinel lymph node #3. Following this background in the axilla was  less than 50 were no palpable nodes. Hemostasis was assured and the deep and subcutaneous tissues tissue was  closed with 3-0 Vicryl. Skin closed with septic or 5-0 Monocryl. Dermabond was applied to both incisions. Sponge needle and instrument counts were correct. Breast binder was placed.    Findings: As above  Estimated Blood Loss:  Minimal         Drains: none  Blood Given: none          Specimens: #1 left breast lumpectomy     #2 hot blue left axillary sentinel lymph node       #3 hot non-blue left axillary sentinel lymph node        #4 hot blue left axillary sentinel lymph node        Complications:  * No complications entered in OR log *         Disposition: PACU - hemodynamically stable.         Condition: stable

## 2015-03-27 NOTE — Interval H&P Note (Signed)
History and Physical Interval Note:  03/27/2015 11:56 AM  Cheryl [name]  has presented today for surgery, with the diagnosis of cancer left breast   The various methods of treatment have been discussed with the patient and family. After consideration of risks, benefits and other options for treatment, the patient has consented to  Procedure(s): LEFT BREAST LUMPECTOMY WITH BRACKETED NEEDLE LOCALIZATION AND LEFT  AXILLARY SENTINEL LYMPH NODE BX (Left) as a surgical intervention .  The patient's history has been reviewed, patient examined, no change in status, stable for surgery.  I have reviewed the patient's chart and labs.  Questions were answered to the patient's satisfaction.     [name] T

## 2015-03-27 NOTE — Discharge Instructions (Signed)
° °Post Anesthesia Home Care Instructions ° °Activity: °Get plenty of rest for the remainder of the day. A responsible adult should stay with you for 24 hours following the procedure.  °For the next 24 hours, DO NOT: °-Drive a car °-Operate machinery °-Drink alcoholic beverages °-Take any medication unless instructed by your physician °-Make any legal decisions or sign important papers. ° °Meals: °Start with liquid foods such as gelatin or soup. Progress to regular foods as tolerated. Avoid greasy, spicy, heavy foods. If nausea and/or vomiting occur, drink only clear liquids until the nausea and/or vomiting subsides. Call your physician if vomiting continues. ° °Special Instructions/Symptoms: °Your throat may feel dry or sore from the anesthesia or the breathing tube placed in your throat during surgery. If this causes discomfort, gargle with warm salt water. The discomfort should disappear within 24 hours. ° °If you had a scopolamine patch placed behind your ear for the management of post- operative nausea and/or vomiting: ° °1. The medication in the patch is effective for 72 hours, after which it should be removed.  Wrap patch in a tissue and discard in the trash. Wash hands thoroughly with soap and water. °2. You may remove the patch earlier than 72 hours if you experience unpleasant side effects which may include dry mouth, dizziness or visual disturbances. °3. Avoid touching the patch. Wash your hands with soap and water after contact with the patch. °  ° ° °[facility] °Office Phone Number [phone] ° °BREAST BIOPSY/ PARTIAL MASTECTOMY: POST OP INSTRUCTIONS ° °Always review your discharge instruction sheet given to you by the facility where your surgery was performed. ° °IF YOU HAVE DISABILITY OR FAMILY LEAVE FORMS, YOU MUST BRING THEM TO THE OFFICE FOR PROCESSING.  DO NOT GIVE THEM TO YOUR DOCTOR. ° °1. A prescription for pain medication may be given to you upon discharge.  Take your pain  medication as prescribed, if needed.  If narcotic pain medicine is not needed, then you may take acetaminophen (Tylenol) or ibuprofen (Advil) as needed. °2. Take your usually prescribed medications unless otherwise directed °3. If you need a refill on your pain medication, please contact your pharmacy.  They will contact our office to request authorization.  Prescriptions will not be filled after 5pm or on week-ends. °4. You should eat very light the first 24 hours after surgery, such as soup, crackers, pudding, etc.  Resume your normal diet the day after surgery. °5. Most patients will experience some swelling and bruising in the breast.  Ice packs and a good support bra will help.  Swelling and bruising can take several days to resolve.  °6. It is common to experience some constipation if taking pain medication after surgery.  Increasing fluid intake and taking a stool softener will usually help or prevent this problem from occurring.  A mild laxative (Milk of Magnesia or Miralax) should be taken according to package directions if there are no bowel movements after 48 hours. °7. Unless discharge instructions indicate otherwise, you may remove your bandages 24-48 hours after surgery, and you may shower at that time.  You may have steri-strips (small skin tapes) in place directly over the incision.  These strips should be left on the skin for 7-10 days.  If your surgeon used skin glue on the incision, you may shower in 24 hours.  The glue will flake off over the next 2-3 weeks.  Any sutures or staples will be removed at the office during your follow-up visit. °8.   ACTIVITIES:  You may resume regular daily activities (gradually increasing) beginning the next day.  Wearing a good support bra or sports bra minimizes pain and swelling.  You may have sexual intercourse when it is comfortable. °a. You may drive when you no longer are taking prescription pain medication, you can comfortably wear a seatbelt, and you can  safely maneuver your car and apply brakes. °b. RETURN TO WORK:  ______________________________________________________________________________________ °9. You should see your doctor in the office for a follow-up appointment approximately two weeks after your surgery.  Your doctor’s nurse will typically make your follow-up appointment when she calls you with your pathology report.  Expect your pathology report 2-3 business days after your surgery.  You may call to check if you do not hear from us after three days. °10. OTHER INSTRUCTIONS: _______________________________________________________________________________________________ _____________________________________________________________________________________________________________________________________ °_____________________________________________________________________________________________________________________________________ °_____________________________________________________________________________________________________________________________________ ° °WHEN TO CALL YOUR DOCTOR: °1. Fever over 101.0 °2. Nausea and/or vomiting. °3. Extreme swelling or bruising. °4. Continued bleeding from incision. °5. Increased pain, redness, or drainage from the incision. ° °The clinic staff is available to answer your questions during regular business hours.  Please don’t hesitate to call and ask to speak to one of the nurses for clinical concerns.  If you have a medical emergency, go to the nearest emergency room or call 911.  A surgeon from [facility] is always on call at the hospital. ° °For further questions, please visit [email or web address]  °

## 2015-03-28 ENCOUNTER — Encounter (HOSPITAL_BASED_OUTPATIENT_CLINIC_OR_DEPARTMENT_OTHER): Payer: Self-pay | Admitting: General Surgery

## 2015-03-29 ENCOUNTER — Other Ambulatory Visit: Payer: Self-pay | Admitting: General Surgery

## 2015-04-01 ENCOUNTER — Telehealth: Payer: Self-pay | Admitting: *Deleted

## 2015-04-01 ENCOUNTER — Encounter: Payer: Self-pay | Admitting: *Deleted

## 2015-04-01 ENCOUNTER — Telehealth: Payer: Self-pay | Admitting: Oncology

## 2015-04-01 NOTE — Telephone Encounter (Signed)
Received order for oncotype testing from Dr. Jana Hakim. Requisition sent to [facility]. PAC sent to [facility]

## 2015-04-01 NOTE — Telephone Encounter (Signed)
Called and left a message with new md visit per pof  anne

## 2015-04-02 ENCOUNTER — Encounter: Payer: Self-pay | Admitting: Genetic Counselor

## 2015-04-02 DIAGNOSIS — C50912 Malignant neoplasm of unspecified site of left female breast: Secondary | ICD-10-CM

## 2015-04-02 DIAGNOSIS — C50412 Malignant neoplasm of upper-outer quadrant of left female breast: Secondary | ICD-10-CM

## 2015-04-02 NOTE — Progress Notes (Signed)
[name]   REFERRING PROVIDER: Dr. Lurline Del  PRIMARY PROVIDER:  No PCP Per Patient  GENETIC TEST RESULT:  Testing Laboratory: [facility]  Test Ordered: BreastNext gene panel Date of Report: 04/01/2015 Result: Normal, no pathogenic mutations identified General Interpretation: Reassuring  HPI: [name] was previously seen in the [facility] due to concerns regarding a hereditary predisposition to cancer. Please refer to our prior cancer genetics clinic note for more information regarding [name] medical, social and family histories, and our assessment and recommendations, at the time. [name] genetic test results and recommendations warranted by these results were recently disclosed to her and are discussed in more detail below.  GENETIC TEST RESULTS: At the time of [name] visit, we recommended she pursue genetic testing, which includes sequencing and deletion/duplication analysis of several genes associated with an increased risk for cancer via a gene panel. The BreastNext gene panel offered by [employer] includes sequencing and rearrangement analysis for the following 17 genes: ATM, BARD1, BRCA1, BRCA2, BRIP1, CDH1, CHEK2, MRE11A, MUTYH, NBN, NF1, PALB2, PTEN, RAD50, RAD51C, RAD51D, and TP53. Genetic testing for this gene panel was normal and did not reveal a pathogenic mutation in any of these genes. A copy of the genetic test report will be scanned into Epic under the media tab.   We discussed with [name] that current genetic testing is not perfect, and it is, therefore, possible there may be a pathogenic gene mutation in one of these genes that current testing cannot detect, but that chance is small.  We also discussed, that it is possible that another gene that has not yet been discovered, or that we have not yet tested, is responsible for the cancer diagnoses in her  family. It is, therefore, important for [name] to continue to remain in touch with cancer genetics so that we can continue to offer [name] the most up to date genetic testing.   CANCER SCREENING RECOMMENDATIONS: This result is reassuring and indicates that [name] likely does not have an increased risk for a future cancer due to a mutation in one of these genes. This normal test also suggests that [name] cancer was most likely not due to an inherited predisposition associated with one of these genes.  Most cancers happen by chance and this negative test suggests that her cancer falls into this category.  We, therefore, recommended she continue to follow the cancer management and screening guidelines provided by her oncology and primary healthcare providers.   RECOMMENDATIONS FOR FAMILY MEMBERS:  Individuals in this family might be at some increased risk of developing cancer, over the general population risk, simply due to the family history of cancer.  We recommended women in this family have a yearly mammogram beginning at age 32, an annual clinical breast exam, and perform monthly breast self-exams. Women in this family should also have a gynecological exam as recommended by their primary provider. All family members should have a colonoscopy by age 80, an annual dermatological exam and continue to follow cancer screening guidelines recommended by their healthcare provider.  FOLLOW-UP: Lastly, we discussed with [name] that cancer genetics is a rapidly advancing field and it is likely that new genetic tests will be appropriate for her and/or family members in the future. We encouraged her to remain in contact with cancer genetics on an annual basis so we can update her personal and family histories  and let her know of advances in cancer genetics that may benefit this family.   Our contact number was provided. [name] questions were answered to her satisfaction, and she knows she is  welcome to call us at anytime with additional questions or concerns.    Catherine A. Fine, MS, CGC Certified Psychologist, [email or web address] Phone: [phone]

## 2015-04-03 ENCOUNTER — Encounter (HOSPITAL_BASED_OUTPATIENT_CLINIC_OR_DEPARTMENT_OTHER): Payer: Self-pay | Admitting: *Deleted

## 2015-04-05 ENCOUNTER — Encounter (HOSPITAL_BASED_OUTPATIENT_CLINIC_OR_DEPARTMENT_OTHER): Payer: Self-pay | Admitting: Anesthesiology

## 2015-04-05 ENCOUNTER — Encounter (HOSPITAL_BASED_OUTPATIENT_CLINIC_OR_DEPARTMENT_OTHER)
Admission: RE | Disposition: A | Discharge status: Home / Self Care | Payer: Self-pay | Source: Ambulatory Visit | Attending: General Surgery

## 2015-04-05 ENCOUNTER — Ambulatory Visit (HOSPITAL_BASED_OUTPATIENT_CLINIC_OR_DEPARTMENT_OTHER): Payer: Federal, State, Local not specified - PPO | Admitting: Anesthesiology

## 2015-04-05 ENCOUNTER — Ambulatory Visit: Payer: Federal, State, Local not specified - PPO | Admitting: Oncology

## 2015-04-05 ENCOUNTER — Ambulatory Visit (HOSPITAL_BASED_OUTPATIENT_CLINIC_OR_DEPARTMENT_OTHER)
Admission: RE | Admit: 2015-04-05 | Discharge: 2015-04-05 | Disposition: A | Discharge status: Home / Self Care | Payer: Federal, State, Local not specified - PPO | Source: Ambulatory Visit | Attending: General Surgery | Admitting: General Surgery

## 2015-04-05 DIAGNOSIS — K219 Gastro-esophageal reflux disease without esophagitis: Secondary | ICD-10-CM | POA: Insufficient documentation

## 2015-04-05 DIAGNOSIS — C50412 Malignant neoplasm of upper-outer quadrant of left female breast: Secondary | ICD-10-CM | POA: Insufficient documentation

## 2015-04-05 DIAGNOSIS — C50912 Malignant neoplasm of unspecified site of left female breast: Secondary | ICD-10-CM | POA: Diagnosis present

## 2015-04-05 HISTORY — PX: BREAST EXCISIONAL BIOPSY: SUR124

## 2015-04-05 HISTORY — PX: RE-EXCISION OF BREAST LUMPECTOMY: SHX6048

## 2015-04-05 SURGERY — EXCISION, LESION, BREAST
Anesthesia: General | Site: Breast | Laterality: Left

## 2015-04-05 MED ORDER — LIDOCAINE HCL (CARDIAC) 20 MG/ML IV SOLN
INTRAVENOUS | Status: DC | PRN
  Administered 2015-04-05: 100 mg via INTRAVENOUS

## 2015-04-05 MED ORDER — HYDROMORPHONE HCL 1 MG/ML IJ SOLN
0.2500 mg | INTRAMUSCULAR | Status: DC | PRN
  Administered 2015-04-05 (×2): 0.25 mg via INTRAVENOUS
  Administered 2015-04-05: 0.5 mg via INTRAVENOUS

## 2015-04-05 MED ORDER — FENTANYL CITRATE (PF) 100 MCG/2ML IJ SOLN
INTRAMUSCULAR | Status: DC | PRN
  Administered 2015-04-05 (×2): 50 ug via INTRAVENOUS
  Administered 2015-04-05: 100 ug via INTRAVENOUS
  Administered 2015-04-05 (×2): 50 ug via INTRAVENOUS

## 2015-04-05 MED ORDER — BUPIVACAINE-EPINEPHRINE 0.25% -1:200000 IJ SOLN
INTRAMUSCULAR | Status: DC | PRN
  Administered 2015-04-05: 20 mL

## 2015-04-05 MED ORDER — DEXAMETHASONE SODIUM PHOSPHATE 4 MG/ML IJ SOLN
INTRAMUSCULAR | Status: DC | PRN
  Administered 2015-04-05: 10 mg via INTRAVENOUS

## 2015-04-05 MED ORDER — OXYCODONE HCL 5 MG/5ML PO SOLN: 5.0000 mg | Freq: Once | ORAL | Status: AC | PRN

## 2015-04-05 MED ORDER — CHLORHEXIDINE GLUCONATE 4 % EX LIQD: 1.0000 "application " | Freq: Once | CUTANEOUS | Status: DC

## 2015-04-05 MED ORDER — MIDAZOLAM HCL 2 MG/2ML IJ SOLN
INTRAMUSCULAR | Status: AC
  Filled 2015-04-05: qty 2

## 2015-04-05 MED ORDER — OXYCODONE HCL 5 MG PO TABS
5.0000 mg | ORAL_TABLET | Freq: Once | ORAL | Status: AC | PRN
  Administered 2015-04-05: 5 mg via ORAL

## 2015-04-05 MED ORDER — FENTANYL CITRATE (PF) 100 MCG/2ML IJ SOLN
INTRAMUSCULAR | Status: AC
  Filled 2015-04-05: qty 6

## 2015-04-05 MED ORDER — HYDROMORPHONE HCL 1 MG/ML IJ SOLN
INTRAMUSCULAR | Status: AC
  Filled 2015-04-05: qty 1

## 2015-04-05 MED ORDER — PROPOFOL 10 MG/ML IV BOLUS
INTRAVENOUS | Status: DC | PRN
  Administered 2015-04-05: 30 mg via INTRAVENOUS
  Administered 2015-04-05: 200 mg via INTRAVENOUS

## 2015-04-05 MED ORDER — MIDAZOLAM HCL 5 MG/5ML IJ SOLN
INTRAMUSCULAR | Status: DC | PRN
  Administered 2015-04-05: 2 mg via INTRAVENOUS

## 2015-04-05 MED ORDER — GLYCOPYRROLATE 0.2 MG/ML IJ SOLN: 0.2000 mg | Freq: Once | INTRAMUSCULAR | Status: DC | PRN

## 2015-04-05 MED ORDER — LACTATED RINGERS IV SOLN
INTRAVENOUS | Status: DC
  Administered 2015-04-05 (×2): via INTRAVENOUS

## 2015-04-05 MED ORDER — CEFAZOLIN SODIUM-DEXTROSE 2-3 GM-% IV SOLR
INTRAVENOUS | Status: DC | PRN
  Administered 2015-04-05: 2 g via INTRAVENOUS

## 2015-04-05 MED ORDER — OXYCODONE HCL 5 MG PO TABS
ORAL_TABLET | ORAL | Status: AC
  Filled 2015-04-05: qty 1

## 2015-04-05 MED ORDER — CEFAZOLIN SODIUM-DEXTROSE 2-3 GM-% IV SOLR
INTRAVENOUS | Status: AC
  Filled 2015-04-05: qty 50

## 2015-04-05 MED ORDER — OXYCODONE-ACETAMINOPHEN 5-325 MG PO TABS: 1.0000 | ORAL_TABLET | ORAL | Status: DC | PRN

## 2015-04-05 MED ORDER — CEFAZOLIN SODIUM-DEXTROSE 2-3 GM-% IV SOLR: 2.0000 g | INTRAVENOUS | Status: DC

## 2015-04-05 MED ORDER — MEPERIDINE HCL 25 MG/ML IJ SOLN: 6.2500 mg | INTRAMUSCULAR | Status: DC | PRN

## 2015-04-05 MED ORDER — MIDAZOLAM HCL 2 MG/2ML IJ SOLN: 1.0000 mg | INTRAMUSCULAR | Status: DC | PRN

## 2015-04-05 MED ORDER — FENTANYL CITRATE (PF) 100 MCG/2ML IJ SOLN: 50.0000 ug | INTRAMUSCULAR | Status: DC | PRN

## 2015-04-05 SURGICAL SUPPLY — 44 items
APPLIER CLIP 9.375 MED OPEN (MISCELLANEOUS) ×2
BLADE SURG 10 STRL SS (BLADE) IMPLANT
BLADE SURG 15 STRL LF DISP TIS (BLADE) ×1 IMPLANT
BLADE SURG 15 STRL SS (BLADE) ×1
CANISTER SUCT 1200ML W/VALVE (MISCELLANEOUS) ×2 IMPLANT
CHLORAPREP W/TINT 26ML (MISCELLANEOUS) ×2 IMPLANT
CLIP APPLIE 9.375 MED OPEN (MISCELLANEOUS) ×1 IMPLANT
CLIP TI MEDIUM 6 (CLIP) IMPLANT
CLIP TI WIDE RED SMALL 6 (CLIP) IMPLANT
COVER BACK TABLE 60X90IN (DRAPES) ×2 IMPLANT
COVER MAYO STAND STRL (DRAPES) ×2 IMPLANT
DEVICE DUBIN W/COMP PLATE 8390 (MISCELLANEOUS) IMPLANT
DRAPE LAPAROSCOPIC ABDOMINAL (DRAPES) ×2 IMPLANT
DRAPE UTILITY XL STRL (DRAPES) ×2 IMPLANT
ELECT COATED BLADE 2.86 ST (ELECTRODE) ×2 IMPLANT
ELECT REM PT RETURN 9FT ADLT (ELECTROSURGICAL) ×2
ELECTRODE REM PT RTRN 9FT ADLT (ELECTROSURGICAL) ×1 IMPLANT
GLOVE BIOGEL M STRL SZ7.5 (GLOVE) ×2 IMPLANT
GLOVE BIOGEL PI IND STRL 8 (GLOVE) ×2 IMPLANT
GLOVE BIOGEL PI INDICATOR 8 (GLOVE) ×2
GLOVE ECLIPSE 7.5 STRL STRAW (GLOVE) ×2 IMPLANT
GOWN STRL REUS W/ TWL LRG LVL3 (GOWN DISPOSABLE) ×1 IMPLANT
GOWN STRL REUS W/ TWL XL LVL3 (GOWN DISPOSABLE) ×1 IMPLANT
GOWN STRL REUS W/TWL LRG LVL3 (GOWN DISPOSABLE) ×1
GOWN STRL REUS W/TWL XL LVL3 (GOWN DISPOSABLE) ×1
KIT MARKER MARGIN INK (KITS) ×2 IMPLANT
LIQUID BAND (GAUZE/BANDAGES/DRESSINGS) ×2 IMPLANT
NEEDLE HYPO 25X1 1.5 SAFETY (NEEDLE) ×2 IMPLANT
NS IRRIG 1000ML POUR BTL (IV SOLUTION) ×2 IMPLANT
PACK BASIN DAY SURGERY FS (CUSTOM PROCEDURE TRAY) ×2 IMPLANT
PENCIL BUTTON HOLSTER BLD 10FT (ELECTRODE) ×2 IMPLANT
SLEEVE SCD COMPRESS KNEE MED (MISCELLANEOUS) IMPLANT
STAPLER VISISTAT 35W (STAPLE) IMPLANT
SUT MNCRL AB 4-0 PS2 18 (SUTURE) IMPLANT
SUT MON AB 5-0 PS2 18 (SUTURE) IMPLANT
SUT SILK 3 0 SH 30 (SUTURE) IMPLANT
SUT VIC AB 4-0 BRD 54 (SUTURE) IMPLANT
SUT VICRYL 3-0 CR8 SH (SUTURE) IMPLANT
SYR BULB 3OZ (MISCELLANEOUS) IMPLANT
SYR CONTROL 10ML LL (SYRINGE) ×2 IMPLANT
TOWEL OR 17X24 6PK STRL BLUE (TOWEL DISPOSABLE) ×4 IMPLANT
TOWEL OR NON WOVEN STRL DISP B (DISPOSABLE) ×2 IMPLANT
TUBE CONNECTING 20X1/4 (TUBING) ×2 IMPLANT
YANKAUER SUCT BULB TIP NO VENT (SUCTIONS) ×2 IMPLANT

## 2015-04-05 NOTE — Interval H&P Note (Signed)
History and Physical Interval Note:  04/05/2015 11:35 AM  Cheryl [name]  has presented today for surgery, with the diagnosis of DCIS left breast  The various methods of treatment have been discussed with the patient and family. After consideration of risks, benefits and other options for treatment, the patient has consented to  Procedure(s): RE-EXCISION LEFT  BREAST LUMPECTOMY (Left) as a surgical intervention .  The patient's history has been reviewed, patient examined, no change in status, stable for surgery.  I have reviewed the patient's chart and labs.  Questions were answered to the patient's satisfaction.     Damontay Alred T

## 2015-04-05 NOTE — Anesthesia Postprocedure Evaluation (Signed)
  Anesthesia Post-op Note  Patient: Cheryl [name]  Procedure(s) Performed: Procedure(s): RE-EXCISION LEFT  BREAST LUMPECTOMY (Left)  Patient Location: PACU  Anesthesia Type: General   Level of Consciousness: awake, alert  and oriented  Airway and Oxygen Therapy: Patient Spontanous Breathing  Post-op Pain: mild  Post-op Assessment: Post-op Vital signs reviewed  Post-op Vital Signs: Reviewed  Last Vitals:  Filed Vitals:   04/05/15 1315  BP: 120/71  Pulse: 80  Temp:   Resp: 18    Complications: No apparent anesthesia complications

## 2015-04-05 NOTE — H&P (Signed)
History of Present Illness Cheryl [name] T. Braun Rocca MD; 02/20/2015 9:52 AM) The patient is a 39 year old female who presents with breast cancer. [name] is a 39 YO pre menopausal female referred by Dr. Enrique Sack for evaluation of recently diagnosed carcinoma of the left breast. she had an initial screening mammogram about 5 years ago at age 44. this was negative. She recently presented for a screening mamogram revealing a new area of suspicious calcifications in the upper outer left breast measuring 4.2 cm in greatest diameter. Subsequent imaging included diagnostic mamogram confirming these findings and ultrasound showing no discrete masses. A stereotactic biopsy was performed on 02/08/2015 with pathology revealing intermediate to high grade ductal carcinoma in situ with probably areas of invasive disease as well. subsequent bilateral breast MRI was performed with findings including a 4.5 cm area of enhancement at the site which includedancy in the upper outer left breast which included a 1 cm spiculated mass which did not contain the biopsy clip. Also seen for an indeterminate oval mass in the central left breast and indeterminate mass in the outer right breast. Follow-up ultrasound and biopsy was done for these 2 masses at which time an additional left breast mass was seen and all 3 areas were biopsied. These were all benign showing a fibroadenoma at the 9:00 position of the right breast, a fibroadenoma at the 10:00 position of the left breast and pseudo-angiomatous stromal hyperplasia at 2:00 in the left breast. [name] seen now in reast cancer multidisciplinary clinic for initial treatment planning. She has experienced no breast symptoms,, specifically lump or pain or skin change or nipple discharge. She does not have a personal history of any previous breast problems. Findings at that time were the following: Tumor size: 4.5 cm Tumor grade: intermediate to high-grade Estrogen Receptor: positive  Progesterone Receptor: positive Her-2 neu: negative Lymph node status: negative Approximately 1 week ago she underwent a left breast lumpectomy. Final pathology showed ductal carcinoma in situ and invasive carcinoma but the inferior and medial margins were very close, within 1 mm for DCIS . We have discussed this finding and I recommended reexcision of these 2 margins and she is in agreement.  Other Problems Cheryl [name] Cheryl [name], [place]; 02/20/2015 7:51 AM) Breast Cancer Gastroesophageal Reflux Disease Lump In Breast  Past Surgical History Cheryl [name] Cheryl [name], RMA; 02/20/2015 7:51 AM) Breast Biopsy Bilateral.  Diagnostic Studies History Cheryl [name] Cheryl [name], RMA; 02/20/2015 7:51 AM) Colonoscopy never Mammogram within last year Pap Smear 1-5 years ago  Social History Cheryl [name] Cheryl [name], RMA; 02/20/2015 7:51 AM) Alcohol use Occasional alcohol use. Caffeine use Carbonated beverages, Coffee, Tea. No drug use Tobacco use Never smoker.  Family History Cheryl [name] Cheryl [name], [place]; 02/20/2015 7:51 AM) Arthritis Father. Bleeding disorder Daughter. Cancer Family Members In General. Cerebrovascular Accident Family Members In General. Diabetes Mellitus Family Members In General, Mother. Heart disease in female family member before age 45 Hypertension Family Members In General, Mother. Migraine Headache Mother. Respiratory Condition Family Members In General. Seizure disorder Family Members In General.  Pregnancy / Birth History Cheryl [name], [place]; 02/20/2015 7:51 AM) Age at menarche 59 years. Contraceptive History Contraceptive implant, Intrauterine device, Oral contraceptives. Gravida 4 Maternal age 59-20 Para 3 Regular periods  Review of Systems Cheryl [name] Witty RMA; 02/20/2015 7:51 AM) General Present- Fatigue and Night Sweats. Not Present- Appetite Loss, Chills, Fever, Weight Gain and Weight Loss. HEENT Present- Wears glasses/contact lenses. Not Present- Earache, Hearing Loss,  Hoarseness, Nose Bleed, Oral Ulcers, Ringing in the Ears, Seasonal Allergies, Sinus Pain, Sore Throat,  Visual Disturbances and Yellow Eyes. Respiratory Not Present- Bloody sputum, Chronic Cough, Difficulty Breathing, Snoring and Wheezing. Breast Not Present- Breast Mass, Breast Pain, Nipple Discharge and Skin Changes. Cardiovascular Not Present- Chest Pain, Difficulty Breathing Lying Down, Leg Cramps, Palpitations, Rapid Heart Rate, Shortness of Breath and Swelling of Extremities. Gastrointestinal Present- Indigestion. Not Present- Abdominal Pain, Bloating, Bloody Stool, Change in Bowel Habits, Chronic diarrhea, Constipation, Difficulty Swallowing, Excessive gas, Gets full quickly at meals, Hemorrhoids, Nausea, Rectal Pain and Vomiting. Female Genitourinary Not Present- Frequency, Nocturia, Painful Urination, Pelvic Pain and Urgency. Musculoskeletal Not Present- Back Pain, Joint Pain, Joint Stiffness, Muscle Pain, Muscle Weakness and Swelling of Extremities. Neurological Present- Headaches. Not Present- Decreased Memory, Fainting, Numbness, Seizures, Tingling, Tremor, Trouble walking and Weakness. Psychiatric Present- Change in Sleep Pattern. Not Present- Anxiety, Bipolar, Depression, Fearful and Frequent crying. Endocrine Present- Hot flashes. Not Present- Cold Intolerance, Excessive Hunger, Hair Changes, Heat Intolerance and New Diabetes. Hematology Not Present- Easy Bruising, Excessive bleeding, Gland problems, HIV and Persistent Infections.   Physical Exam Cheryl [name] T. Cheryl Muldrew MD; 02/20/2015 9:53 AM) The physical exam findings are as follows: Note:General: Alert, mmoderately obese Caucasian female, in no distress Skin: Warm and dry without rash or infection. HEENT: No palpable masses or thyromegaly. Sclera nonicteric. Pupils equal round and reactive. Oropharynx clear. Lymph nodes: No cervical, supraclavicular, or inguinal nodes palpable. breasts: Large breasts bilaterally with mild grade 1  ptosis. Healing lumpectomy incision lateral breast with slight serous drainage but no evidence of infection Lungs: Breath sounds clear and equal. No wheezing or increased work of breathing. Cardiovascular: Regular rate and rhythm without murmer. No JVD or edema. Peripheral pulses intact. No carotid bruits. Abdomen: Nondistended. Soft and nontender. No masses palpable. No organomegaly. No palpable hernias. Extremities: No edema or joint swelling or deformity. No chronic venous stasis changes. Neurologic: Alert and fully oriented. Gait normal. No focal weakness. Psychiatric: Normal mood and affect. Thought content appropriate with normal judgement and insight    Assessment & Plan Cheryl [name] T. Jaeceon Michelin MD; 02/20/2015 10:02 AM) BREAST CANCER, LEFT (174.9  C50.912) MALIGNANT NEOPLASM OF UPPER-OUTER QUADRANT OF LEFT FEMALE BREAST (174.4  C50.412) Impression: new diagnosis of approximately 4.5 cm area ductal carcinoma in situ with a small less than 1 cm area of invasion. Inferior and medial margins are close for DCIS.  Plan: Left breast lumpectomy reexcision of inferior and medial margins. I discussed the procedure with the patient and indications as well as risks of anesthesia complications, bleeding, infection and possible need for further surgery if margins remain inadequate.

## 2015-04-05 NOTE — Discharge Instructions (Signed)
[place] Office Phone Number [phone]  BREAST BIOPSY/Re-Excision: POST OP INSTRUCTIONS  Always review your discharge instruction sheet given to you by the facility where your surgery was performed.  IF YOU HAVE DISABILITY OR FAMILY LEAVE FORMS, YOU MUST BRING THEM TO THE OFFICE FOR PROCESSING.  DO NOT GIVE THEM TO YOUR DOCTOR.  1. A prescription for pain medication may be given to you upon discharge.  Take your pain medication as prescribed, if needed.  If narcotic pain medicine is not needed, then you may take acetaminophen (Tylenol) or ibuprofen (Advil) as needed. 2. Take your usually prescribed medications unless otherwise directed 3. If you need a refill on your pain medication, please contact your pharmacy.  They will contact our office to request authorization.  Prescriptions will not be filled after 5pm or on week-ends. 4. You should eat very light the first 24 hours after surgery, such as soup, crackers, pudding, etc.  Resume your normal diet the day after surgery. 5. Most patients will experience some swelling and bruising in the breast.  Ice packs and a good support bra will help.  Swelling and bruising can take several days to resolve.  6. It is common to experience some constipation if taking pain medication after surgery.  Increasing fluid intake and taking a stool softener will usually help or prevent this problem from occurring.  A mild laxative (Milk of Magnesia or Miralax) should be taken according to package directions if there are no bowel movements after 48 hours. 7. Unless discharge instructions indicate otherwise, you may remove your bandages 24-48 hours after surgery, and you may shower at that time.  You may have steri-strips (small skin tapes) in place directly over the incision.  These strips should be left on the skin for 7-10 days.  If your surgeon used skin glue on the incision, you may shower in 24 hours.  The glue will flake off over the next 2-3  weeks.  Any sutures or staples will be removed at the office during your follow-up visit. 8. ACTIVITIES:  You may resume regular daily activities (gradually increasing) beginning the next day.  Wearing a good support bra or sports bra minimizes pain and swelling.  You may have sexual intercourse when it is comfortable. a. You may drive when you no longer are taking prescription pain medication, you can comfortably wear a seatbelt, and you can safely maneuver your car and apply brakes. b. RETURN TO WORK:  ______________________________________________________________________________________ 9. You should see your doctor in the office for a follow-up appointment approximately two weeks after your surgery.  Your doctors nurse will typically make your follow-up appointment when she calls you with your pathology report.  Expect your pathology report 2-3 business days after your surgery.  You may call to check if you do not hear from [place] after three days. 10. OTHER INSTRUCTIONS: _______________________________________________________________________________________________ _____________________________________________________________________________________________________________________________________ _____________________________________________________________________________________________________________________________________ _____________________________________________________________________________________________________________________________________  WHEN TO CALL YOUR DOCTOR: 1. Fever over 101.0 2. Nausea and/or vomiting. 3. Extreme swelling or bruising. 4. Continued bleeding from incision. 5. Increased pain, redness, or drainage from the incision.  The clinic staff is available to answer your questions during regular business hours.  Please dont hesitate to call and ask to speak to one of the nurses for clinical concerns.  If you have a medical emergency, go to the nearest emergency  room or call 911.  A surgeon from [facility] is always on call at the hospital.  For further questions, please visit [email or web address]  Office Phone Number [phone]  BREAST BIOPSY/ PARTIAL MASTECTOMY: POST OP INSTRUCTIONS  Always review your discharge instruction sheet given to you by the facility where your surgery was performed.  IF YOU HAVE DISABILITY OR FAMILY LEAVE FORMS, YOU MUST BRING THEM TO THE OFFICE FOR PROCESSING.  DO NOT GIVE THEM TO YOUR DOCTOR.  11. A prescription for pain medication may be given to you upon discharge.  Take your pain medication as prescribed, if needed.  If narcotic pain medicine is not needed, then you may take acetaminophen (Tylenol) or ibuprofen (Advil) as needed. 12. Take your usually prescribed medications unless otherwise directed 13. If you need a refill on your pain medication, please contact your pharmacy.  They will contact our office to request authorization.  Prescriptions will not be filled after 5pm or on week-ends. 14. You should eat very light the first 24 hours after surgery, such as soup, crackers, pudding, etc.  Resume your normal diet the day after surgery. 15. Most patients will experience some swelling and bruising in the breast.  Ice packs and a good support bra will help.  Swelling and bruising can take several days to resolve.  16. It is common to experience some constipation if taking pain medication after surgery.  Increasing fluid intake and taking a stool softener will usually help or prevent this problem from occurring.  A mild laxative (Milk of Magnesia or Miralax) should be taken according to package directions if there are no bowel movements after 48 hours. 17. Unless discharge instructions indicate otherwise, you may remove your bandages 24-48 hours after surgery, and you may shower at that time.  You may have steri-strips (small skin tapes) in place directly over the incision.   These strips should be left on the skin for 7-10 days.  If your surgeon used skin glue on the incision, you may shower in 24 hours.  The glue will flake off over the next 2-3 weeks.  Any sutures or staples will be removed at the office during your follow-up visit. 18. ACTIVITIES:  You may resume regular daily activities (gradually increasing) beginning the next day.  Wearing a good support bra or sports bra minimizes pain and swelling.  You may have sexual intercourse when it is comfortable. a. You may drive when you no longer are taking prescription pain medication, you can comfortably wear a seatbelt, and you can safely maneuver your car and apply brakes. b. RETURN TO WORK:  ______________________________________________________________________________________ 19. You should see your doctor in the office for a follow-up appointment approximately two weeks after your surgery.  Your doctors nurse will typically make your follow-up appointment when she calls you with your pathology report.  Expect your pathology report 2-3 business days after your surgery.  You may call to check if you do not hear from [place] after three days. 20. OTHER INSTRUCTIONS: _ WHEN TO CALL YOUR DOCTOR: 6. Fever over 101.0 7. Nausea and/or vomiting. 8. Extreme swelling or bruising. 9. Continued bleeding from incision. 10. Increased pain, redness, or drainage from the incision.  The clinic staff is available to answer your questions during regular business hours.  Please dont hesitate to call and ask to speak to one of the nurses for clinical concerns.  If you have a medical emergency, go to the nearest emergency room or call 911.  A surgeon from [facility] is always on call at the hospital.  For further questions, please visit [email or web address]    Post Anesthesia Home Care Instructions  Activity: Get plenty of rest for the remainder of the day. A responsible adult should stay with you for 24 hours  following the procedure.  For the next 24 hours, DO NOT: -Drive a car -Paediatric nurse -Drink alcoholic beverages -Take any medication unless instructed by your physician -Make any legal decisions or sign important papers.  Meals: Start with liquid foods such as gelatin or soup. Progress to regular foods as tolerated. Avoid greasy, spicy, heavy foods. If nausea and/or vomiting occur, drink only clear liquids until the nausea and/or vomiting subsides. Call your physician if vomiting continues.  Special Instructions/Symptoms: Your throat may feel dry or sore from the anesthesia or the breathing tube placed in your throat during surgery. If this causes discomfort, gargle with warm salt water. The discomfort should disappear within 24 hours.  If you had a scopolamine patch placed behind your ear for the management of post- operative nausea and/or vomiting:  1. The medication in the patch is effective for 72 hours, after which it should be removed.  Wrap patch in a tissue and discard in the trash. Wash hands thoroughly with soap and water. 2. You may remove the patch earlier than 72 hours if you experience unpleasant side effects which may include dry mouth, dizziness or visual disturbances. 3. Avoid touching the patch. Wash your hands with soap and water after contact with the patch.

## 2015-04-05 NOTE — Op Note (Signed)
Preoperative Diagnosis: left breast cancer  Postoprative Diagnosis: same  Procedure: Procedure(s): RE-EXCISION LEFT  BREAST LUMPECTOMY   Surgeon: [name] T   Assistants: none  Anesthesia:  General LMA anesthesia  Indications: patient is status post recent left breast lumpectomy and axillary sentinel lymph node biopsy for invasive and in situ cancer of the left breast at approximately 4.5 cm area of in situ disease. Final margins were extremely close, less than 1 mm at the inferior and medial margins. After discussion of options with the patient and indications for surgery we have elected to re-excise these 2 margins    Procedure Detail:  Patient was brought to the operating room, placed in the supine position on the operating table, and general LMA anesthesia induced. She received preoperative IV antibiotics. The left breast was widely sterilely prepped and draped. Patient timeout was performed and correct procedure verified. The previous incision was sharply opened and subcutaneous suture material removed and dissection carried down into the lumpectomy cavity. A moderate sized seroma was evacuated. The fluid was all clear. Deep suture material was all removed and the previous lumpectomy cavity widely exposed with the margin marking clips identified. Using cautery I then reexcised the medial and the inferior margins back about 0.5 cm. These margins were inked and sent for permanent pathology. I did an additional shave of the inferior margin that was not oriented. Hemostasis was obtained with cautery. The soft tissue was infiltrated with Marcaine. Margins were remarked with clips. The breast and subcutaneous tissue was closed with interrupted 3-0 Vicryl and the skin with subcuticular 5-0 Monocryl and Liquiban. Sponge needle and instrument counts were correct    Findings: As above  Estimated Blood Loss:  Minimal         Drains: none  Blood Given: none          Specimens:  further reexcision of medial and inferior lumpectomy margins        Complications:  * No complications entered in OR log *         Disposition: PACU - hemodynamically stable.         Condition: stable

## 2015-04-05 NOTE — Anesthesia Preprocedure Evaluation (Signed)
Anesthesia Evaluation  Patient identified by MRN, date of birth, ID band Patient awake    Reviewed: Allergy & Precautions, NPO status , Patient's Chart, lab work & pertinent test results  Airway Mallampati: I  TM Distance: >3 FB Neck ROM: Full    Dental  (+) Teeth Intact, Dental Advisory Given   Pulmonary  breath sounds clear to auscultation        Cardiovascular Rhythm:Regular Rate:Normal     Neuro/Psych    GI/Hepatic   Endo/Other    Renal/GU      Musculoskeletal   Abdominal   Peds  Hematology   Anesthesia Other Findings   Reproductive/Obstetrics                             Anesthesia Physical Anesthesia Plan  ASA: II  Anesthesia Plan: General   Post-op Pain Management:    Induction: Intravenous  Airway Management Planned: LMA  Additional Equipment:   Intra-op Plan:   Post-operative Plan: Extubation in OR  Informed Consent: I have reviewed the patients History and Physical, chart, labs and discussed the procedure including the risks, benefits and alternatives for the proposed anesthesia with the patient or authorized representative who has indicated his/her understanding and acceptance.   Dental advisory given  Plan Discussed with: CRNA, Anesthesiologist and Surgeon  Anesthesia Plan Comments:         Anesthesia Quick Evaluation

## 2015-04-05 NOTE — Transfer of Care (Signed)
Immediate Anesthesia Transfer of Care Note  Patient: Cheryl [name]  Procedure(s) Performed: Procedure(s): RE-EXCISION LEFT  BREAST LUMPECTOMY (Left)  Patient Location: PACU  Anesthesia Type:General  Level of Consciousness: awake and patient cooperative  Airway & Oxygen Therapy: Patient Spontanous Breathing and Patient connected to face mask oxygen  Post-op Assessment: Report given to RN and Post -op Vital signs reviewed and stable  Post vital signs: Reviewed and stable  Last Vitals:  Filed Vitals:   04/05/15 1048  BP: 134/78  Pulse: 83  Temp: 37.1 C  Resp: 16    Complications: No apparent anesthesia complications

## 2015-04-05 NOTE — Anesthesia Procedure Notes (Signed)
Procedure Name: LMA Insertion Date/Time: 04/05/2015 11:40 AM Performed by: Toula Moos L Pre-anesthesia Checklist: Patient identified, Emergency Drugs available, Suction available and Patient being monitored Patient Re-evaluated:Patient Re-evaluated prior to inductionOxygen Delivery Method: Circle System Utilized Preoxygenation: Pre-oxygenation with 100% oxygen Intubation Type: IV induction Ventilation: Mask ventilation without difficulty LMA: LMA inserted LMA Size: 4.0 Number of attempts: 1 Airway Equipment and Method: Bite block Placement Confirmation: positive ETCO2 Tube secured with: Tape Dental Injury: Teeth and Oropharynx as per pre-operative assessment

## 2015-04-08 ENCOUNTER — Encounter (HOSPITAL_BASED_OUTPATIENT_CLINIC_OR_DEPARTMENT_OTHER): Payer: Self-pay | Admitting: General Surgery

## 2015-04-10 ENCOUNTER — Ambulatory Visit: Payer: Federal, State, Local not specified - PPO | Admitting: Radiation Oncology

## 2015-04-10 ENCOUNTER — Ambulatory Visit: Payer: Federal, State, Local not specified - PPO

## 2015-04-12 ENCOUNTER — Other Ambulatory Visit: Payer: Self-pay | Admitting: Oncology

## 2015-04-12 ENCOUNTER — Encounter (HOSPITAL_COMMUNITY): Payer: Self-pay

## 2015-04-12 ENCOUNTER — Telehealth: Payer: Self-pay | Admitting: *Deleted

## 2015-04-12 NOTE — Progress Notes (Unsigned)
I called [name] and gave her the Oncotype results. She understands she will need adjuvant chemotherapy. Most likely we will do cyclophosphamide and docetaxel every 3 weeks 4. She sees me on May 27 and we will discuss it further at that point.

## 2015-04-12 NOTE — Telephone Encounter (Signed)
Received oncotype score of 38/26%. Copy given to Dr. Jana Hakim. Original to HIM for scanning.

## 2015-04-12 NOTE — Telephone Encounter (Signed)
VM message from pt returning call to Dr. Jana Hakim. Call her @ [phone]

## 2015-04-15 NOTE — Progress Notes (Signed)
Location of Breast Cancer:Left  Histology per Pathology Report:03/27/2015  iagnosis 1. Breast, lumpectomy, left - INVASIVE GRADE III DUCTAL CARCINOMA, SPANNING 1.3 CM IN GREATEST DIMENSION. - EXTENSIVE ASSOCIATED HIGH GRADE DUCTAL CARCINOMA IN SITU WITH COMEDONECROSIS AND CALCIFICATIONS. - DUCTAL CARCINOMA IN SITU IS EXTREMELY CLOSE (LESS THAN 0.01 CM) TO INFERIOR MARGIN. - DUCTAL CARCINOMA IN SITU IS EXTREMELY CLOSE (LESS THAN 0.1 CM ) TO MEDIAL MARGIN. - DUCTAL CARCINOMA IN SITU IS CLOSE (LESS THAN 0.3 CM) TO ANTERIOR MARGIN. - OTHER MARGINS ARE NEGATIVE. - SEE ONCOLOGY TEMPLATE. 2. Lymph node, sentinel, biopsy, left axillary #1 - ONE BENIGN LYMPH NODE WITH NO TUMOR SEEN (0/1). 3. Lymph node, sentinel, biopsy, left axillary #2 - ONE BENIGN LYMPH NODE WITH NO TUMOR SEEN (0/1). 4. Lymph node, sentinel, biopsy, left axillary #3 - ONE BENIGN LYMPH NODE WITH NO TUMOR SEEN (0/1). 5. Lymph node, sentinel, biopsy, left axillary #4 - ONE BENIGN LYMPH NODE WITH NO TUMOR SEEN (0/1). Receptor Status: ER(), PR (), Her2-neu ()  Did patient present with symptoms (if so, please note symptoms) or was this found on screening mammography?:   Past/Anticipated interventions by surgeon, if any:  Past/Anticipated interventions by medical oncology, if any: Chemotherapy   Lymphedema issues, if any:  Pain issues, if any:  SAFETY ISSUES:  Prior radiation?   Pacemaker/ICD?   Possible current pregnancy?  Is the patient on methotrexate?   Current Complaints / other details:      Arlyss Repress, RN 04/15/2015,3:04 PM

## 2015-04-18 ENCOUNTER — Inpatient Hospital Stay
Admission: RE | Admit: 2015-04-18 | Discharge: 2015-04-18 | Disposition: A | Discharge status: Home / Self Care | Payer: Federal, State, Local not specified - PPO | Source: Ambulatory Visit | Attending: Radiation Oncology | Admitting: Radiation Oncology

## 2015-04-18 ENCOUNTER — Inpatient Hospital Stay
Admission: RE | Admit: 2015-04-18 | Payer: Federal, State, Local not specified - PPO | Source: Ambulatory Visit | Admitting: Radiation Oncology

## 2015-04-19 ENCOUNTER — Other Ambulatory Visit: Payer: Self-pay | Admitting: General Surgery

## 2015-04-19 ENCOUNTER — Ambulatory Visit (HOSPITAL_BASED_OUTPATIENT_CLINIC_OR_DEPARTMENT_OTHER): Payer: Federal, State, Local not specified - PPO | Admitting: Oncology

## 2015-04-19 ENCOUNTER — Telehealth: Payer: Self-pay | Admitting: Oncology

## 2015-04-19 VITALS — BP 136/88 | HR 90 | Temp 99.7°F | Resp 18 | Ht 62.0 in | Wt 212.5 lb

## 2015-04-19 DIAGNOSIS — Z171 Estrogen receptor negative status [ER-]: Secondary | ICD-10-CM | POA: Diagnosis not present

## 2015-04-19 DIAGNOSIS — C50412 Malignant neoplasm of upper-outer quadrant of left female breast: Secondary | ICD-10-CM | POA: Diagnosis not present

## 2015-04-19 NOTE — Telephone Encounter (Signed)
Gave avs & calendar for June/July.  °

## 2015-04-19 NOTE — Progress Notes (Signed)
[name]  Telephone:(336) [phone] Fax:(336) [phone]     ID: Cheryl [name] DOB: 1976-08-17  MR#: [id number]  UMP#:[id number]  Patient Care Team: No Pcp Per Patient as PCP - General (General Practice) [name], MD as Consulting Physician (Obstetrics and Gynecology) [name], MD as Consulting Physician (General Surgery) [name], MD as Consulting Physician (Oncology) [name], MD as Consulting Physician (Radiation Oncology) [name], RN as Registered Nurse [name], RN as Registered Nurse [name], NP as Nurse Practitioner (Nurse Practitioner) PCP: No PCP Per Patient OTHER MD:  CHIEF COMPLAINT: Left breast cancer  CURRENT TREATMENT: Adjuvant chemotherapy   BREAST CANCER HISTORY: From the original intake note:  The patient had screening mammography 02/01/2015 which showed a calcifications in the upper outer quadrant of the left breast. On 02/08/2015 she underwent left diagnostic mammography with tomosynthesis and left breast ultrasonography at the breast Center. The breast density was category C. Mammography confirmed a group of pleomorphic calcifications spanning 4.2 cm maximally. Physical examination of the upper outer quadrant of the left breast showed an area of thickening at the approximate 2:00 position. Ultrasound of this area showed no discrete masses. There was vague shadowing present. Calcifications could not be clearly identified sonographically. There was no lymphadenopathy in the left axilla.  On the same day the patient underwent biopsy of the left breast area in question, with the pathology (S [id number]) showing ductal carcinoma in situ, grade 2 or 3, with possible areas of microinvasion, the cancer cells being strongly estrogen and progesterone receptor positive, both at 100%.  On 02/15/2015 the patient underwent bilateral breast MRI. This showed the area of malignancy in the upper outer quadrant  to measure 4.5 cm, including a large area of non-masslike enhancement and a 1 cm spiculated mass. There was also an indeterminate oval mass in the central left breast and another indeterminant mass in the slightly outer right breast anteriorly. Biopsy of the right breast mass 02/18/2015 ([id number]) showed a fibroadenoma, with no evidence of malignancy. Biopsy of 2 additional areas of the left breast (at 10:00 and 2:00) showed a fibroadenoma in the upper inner quadrant, and pseudo-angiomatous stromal hyperplasia (PA SH) at the 2:00 area of the left breast.  The patient's subsequent history is as detailed below.  INTERVAL HISTORY: Derek returns today for follow-up of her triple negative invasive breast cancer. To recap: She underwent left lumpectomy and sentinel lymph node sampling 03/27/2015. The final pathology ([id number]) showed a 1.3 cm invasive ductal carcinoma, grade 3, which was triple negative, with an HER-2 signals ratio of 1.61 and number per cell 2.65. The proliferation marker was 33%. All 4 sentinel lymph nodes were negative. Margins were negative but very close especially for the ductal carcinoma in situ component, so these were cleared with reexcision 04/05/2015.  Patient had an Oncotype sent on the invasive tumor May 2016 and this showed a recurrence score of 38, predicting a 26% risk of distant recurrence within 10 years. Also predicts significant benefit from chemotherapy. The patient is here to discuss those results.  REVIEW OF SYSTEMS: Cheryl [name] did well with her surgeries, with minimal pain initially, no bleeding or fever problems. She has mild soreness sometimes in the lateral left breast currently. [name] back to work, and exercises regularly by walking about 45 minutes daily. She still having menstrual periods, although on the tamoxifen they have become slightly irregular. A detailed review of systems today was otherwise negative  PAST MEDICAL  HISTORY: Past Medical History    Diagnosis Date  . Breast cancer   . Wears contact lenses     PAST SURGICAL HISTORY: Past Surgical History  Procedure Laterality Date  . Breast lumpectomy with needle localization and axillary sentinel lymph node bx Left 03/27/2015    Procedure: LEFT BREAST LUMPECTOMY WITH BRACKETED NEEDLE LOCALIZATION AND LEFT  AXILLARY SENTINEL LYMPH NODE BX;  Surgeon: [name], MD;  Location: [facility];  Service: General;  Laterality: Left;  . Breast surgery    . Re-excision of breast lumpectomy Left 04/05/2015    Procedure: RE-EXCISION LEFT  BREAST LUMPECTOMY;  Surgeon: [name], MD;  Location: [facility];  Service: General;  Laterality: Left;    FAMILY HISTORY Family History  Problem Relation Age of Onset  . Lung cancer Maternal Grandmother 35    non smoker  . Lung cancer Maternal Grandfather 63    non smoker worked in [employer]  . Cancer Paternal Grandfather 35    throat cancer ? smoker  . Cancer Cousin 41    throat cancer ? smoker   the patient's parents are living, in their mid-50s as of March 2016. The patient had no siblings. Both the patient's mother's parents died from lung cancer although they did not smoke. [place] and her maternal grandfather was a Ecologist. There is no history of breast or ovarian cancer in the family to her knowledge  GYNECOLOGIC HISTORY:  Patient's last menstrual period was 04/02/2015. Menarche age 53, first live birth age 46. The patient is GX P3. [name] having regular periods. She uses the Essure device for contraception.  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 Her husband [name] is a Freight forwarder. Their daughter Cheryl [name] lives in [place] where she works in Press photographer. Daughter Cheryl [name] is a Research scientist (physical sciences) and lives with the patient.. Daughter Cheryl [name] also at home is 36 years old.    ADVANCED DIRECTIVES: Not in place   HEALTH MAINTENANCE: History  Substance  Use Topics  . Smoking status: Never Smoker   . Smokeless tobacco: Never Used  . Alcohol Use: Yes     Colonoscopy:  PAP:  Bone density:  Lipid panel:  Allergies  Allergen Reactions  . Adhesive [Tape] Other (See Comments)    Skin blisters    Current Outpatient Prescriptions  Medication Sig Dispense Refill  . ibuprofen (ADVIL,MOTRIN) 400 MG tablet Take 400 mg by mouth every 6 (six) hours as needed.    Marland Kitchen oxyCODONE-acetaminophen (ROXICET) 5-325 MG per tablet Take 1-2 tablets by mouth every 4 (four) hours as needed for severe pain. 30 tablet 0  . tamoxifen (NOLVADEX) 20 MG tablet Take 1 tablet (20 mg total) by mouth daily. 90 tablet 3   No current facility-administered medications for this visit.    OBJECTIVE: There-appearing white woman who appears well Filed Vitals:   04/19/15 1005  BP: 136/88  Pulse: 90  Temp: 99.7 F (37.6 C)  Resp: 18     Body mass index is 38.86 kg/(m^2).    ECOG FS:0 - Asymptomatic  Sclerae unicteric, EOMs intact Oropharynx clear, dentition in good repair No cervical or supraclavicular adenopathy Lungs no rales or rhonchi Heart regular rate and rhythm Abd soft, nontender, positive bowel sounds MSK no focal spinal tenderness, no upper extremity lymphedema Neuro: nonfocal, well oriented, appropriate affect Breasts: The right breast is unremarkable. The left breast is status post recent lumpectomy and axillary  lymph node sampling. Both incisions are healing nicely, with no dehiscence, erythema, or swelling. There is no evidence of disease recurrence. The cosmetic result is excellent. The left axilla is benign.   LAB RESULTS:  CMP     Component Value Date/Time   NA 138 02/20/2015 0823   K 3.8 02/20/2015 0823   CO2 21* 02/20/2015 0823   GLUCOSE 118 02/20/2015 0823   BUN 10.7 02/20/2015 0823   CREATININE 0.8 02/20/2015 0823   CALCIUM 8.7 02/20/2015 0823   PROT 7.3 02/20/2015 0823   ALBUMIN 3.8 02/20/2015 0823   AST 14 02/20/2015 0823   ALT 14  02/20/2015 0823   ALKPHOS 66 02/20/2015 0823   BILITOT 0.89 02/20/2015 0823    INo results found for: SPEP, UPEP  Lab Results  Component Value Date   WBC 8.2 02/20/2015   NEUTROABS 5.1 02/20/2015   HGB 14.4 03/27/2015   HCT 38.1 02/20/2015   MCV 88.0 02/20/2015   PLT 337 02/20/2015      Chemistry      Component Value Date/Time   NA 138 02/20/2015 0823   K 3.8 02/20/2015 0823   CO2 21* 02/20/2015 0823   BUN 10.7 02/20/2015 0823   CREATININE 0.8 02/20/2015 0823      Component Value Date/Time   CALCIUM 8.7 02/20/2015 0823   ALKPHOS 66 02/20/2015 0823   AST 14 02/20/2015 0823   ALT 14 02/20/2015 0823   BILITOT 0.89 02/20/2015 0823       No results found for: LABCA2  No components found for: LABCA125  No results for input(s): INR in the last 168 hours.  Urinalysis No results found for: COLORURINE, APPEARANCEUR, LABSPEC, PHURINE, GLUCOSEU, HGBUR, BILIRUBINUR, KETONESUR, PROTEINUR, UROBILINOGEN, NITRITE, LEUKOCYTESUR  STUDIES: Nm Sentinel Node Inj-no Rpt (breast)  03/27/2015   CLINICAL DATA: Cancer left breast   Sulfur colloid was injected intradermally by the nuclear medicine  technologist for breast cancer sentinel node localization.    Mm Breast Surgical Specimen  03/27/2015   CLINICAL DATA:  Biopsy proven ductal carcinoma in-situ with focal areas of invasion. Status post surgical excision following bracketed needle localizations.  EXAM: SPECIMEN RADIOGRAPH OF THE LEFT BREAST  COMPARISON:  Previous exam(s).  FINDINGS: Status post excision of the left breast. The wire tips and biopsy marker clip are present and are marked for pathology.  IMPRESSION: Specimen radiograph of the left breast.   Electronically Signed   By: Lillia Mountain M.D.   On: 03/27/2015 13:06   Mm Lt Plc Breast Loc Dev   1st Lesion  Inc Mammo Guide  03/27/2015   CLINICAL DATA:  Biopsy proven left breast ductal carcinoma in-situ with focal areas of invasion. Bracketed wire localization requested.  EXAM:  NEEDLE LOCALIZATION OF THE LEFT BREAST WITH MAMMO GUIDANCE  COMPARISON:  Previous exams.  FINDINGS: Patient presents for needle localization prior to surgery. I met with the patient and we discussed the procedure of needle localization including benefits and alternatives. We discussed the high likelihood of a successful procedure. We discussed the risks of the procedure, including infection, bleeding, tissue injury, and further surgery. Informed, written consent was given. The usual time-out protocol was performed immediately prior to the procedure.  Using mammographic guidance, sterile technique, 2% lidocaine and a #9 and #7 modified Kopans needles, were used to bracket calcifications in the left breast. A lateral to medial approach were issues. The images were marked for Dr. [name].  IMPRESSION: Needle localization of the left breast. No apparent complications.  Electronically Signed   By: Lillia Mountain M.D.   On: 03/27/2015 13:08   Mm Lt Plc Breast Loc Dev   Ea Add Lesion  Inc Mammo Guide  03/27/2015   CLINICAL DATA:  Biopsy proven left breast ductal carcinoma in-situ with focal areas of invasion. Bracketed wire localization requested.  EXAM: NEEDLE LOCALIZATION OF THE LEFT BREAST WITH MAMMO GUIDANCE  COMPARISON:  Previous exams.  FINDINGS: Patient presents for needle localization prior to surgery. I met with the patient and we discussed the procedure of needle localization including benefits and alternatives. We discussed the high likelihood of a successful procedure. We discussed the risks of the procedure, including infection, bleeding, tissue injury, and further surgery. Informed, written consent was given. The usual time-out protocol was performed immediately prior to the procedure.  Using mammographic guidance, sterile technique, 2% lidocaine and a #9 and #7 modified Kopans needles, were used to bracket calcifications in the left breast. A lateral to medial approach were issues. The images were marked  for Dr. [name].  IMPRESSION: Needle localization of the left breast. No apparent complications.   Electronically Signed   By: Lillia Mountain M.D.   On: 03/27/2015 13:08    ASSESSMENT: 39 y.o. [place], [place] woman status post left breast biopsy 02/08/2015 for ductal carcinoma in situ, grade 2 or 3, strongly estrogen and progesterone receptor positive, with likely areas of microinvasion  (a) biopsy of an area in the left breast upper outer quadrant showed PASH  (b) biopsy of 2 additional questionable areas, one in each breast, showed bilateral fibroadenomas  (1) genetics testing March 2016 through the BreastNext gene panel offered by [employer] showed no mutations in the following 17 genes: ATM, BARD1, BRCA1, BRCA2, BRIP1, CDH1, CHEK2, MRE11A, MUTYH, NBN, NF1, PALB2, PTEN, RAD50, RAD51C, RAD51D, and TP53.  (2) status post left lumpectomy with sentinel lymph node sampling 03/27/2015 for a pT1c pN0, stage IA invasive ductal carcinoma, grade 3, triple negative, with an MIB-1 of 33%  (a) close margins were cleared with subsequent excision 04/05/2015.  (3) Oncotype DX score of 38 predicts a risk of 26% outside the breast recurrence within 10 years if the patient's only systemic treatment is tamoxifen for 5 years  (4) adjuvant chemotherapy will consist of doxorubicin and cyclophosphamide in dose dense fashion 4, with onpro support, followed by paclitaxel weekly 12.  (5) adjuvant radiation to follow chemotherapy  (5) tamoxifen started 02/20/2015 (neoadjuvantly), discontinued 04/19/2015  PLAN: I spent approximately 60 minutes with Enid today going over her situation. We have started with noninvasive estrogen receptor positive disease, and ended up with invasive triple negative breast cancer. The good news though is that this was node negative. As a result she does not qualify for the BR 003 study.  She will receive standard adjuvant chemotherapy consisting of doxorubicin and cyclophosphamide in dose  dense fashion 4, followed by weekly paclitaxel 12. We will not add the carboplatin with the paclitaxel. She prefers onpro to the idea of returning for a and "immune booster shot" on day 2.  Today we discussed the possible ball toxicities, side effects and complications of treatment, including the possibility of weakening of the heart muscle and permanent menopause. The target start date is June 9. We will have an echocardiogram prior to that date, and she will also come to chemotherapy school. We will ask her surgeon to place a port. She will see [place] the day before treatment to review the results of the echo, and also to give the  patient directions on how to take her supportive medicines as well as to place those prescriptions.  Juwana has a good understanding of the overall plan. She agrees with it. She knows the goal of treatment in her case is cure. She will call with any problems that may develop before her next visit here.  [name], MD   04/19/2015 10:11 AM Medical Oncology and Hematology [facility] [address] [place], [place] [address] Tel. [phone]    Fax. [phone]

## 2015-04-23 ENCOUNTER — Encounter (HOSPITAL_BASED_OUTPATIENT_CLINIC_OR_DEPARTMENT_OTHER): Payer: Self-pay | Admitting: *Deleted

## 2015-04-25 ENCOUNTER — Ambulatory Visit (HOSPITAL_BASED_OUTPATIENT_CLINIC_OR_DEPARTMENT_OTHER)
Admission: RE | Admit: 2015-04-25 | Discharge: 2015-04-25 | Disposition: A | Discharge status: Home / Self Care | Payer: Federal, State, Local not specified - PPO | Source: Ambulatory Visit | Attending: General Surgery | Admitting: General Surgery

## 2015-04-25 ENCOUNTER — Ambulatory Visit (HOSPITAL_COMMUNITY): Payer: Federal, State, Local not specified - PPO

## 2015-04-25 ENCOUNTER — Ambulatory Visit (HOSPITAL_BASED_OUTPATIENT_CLINIC_OR_DEPARTMENT_OTHER): Payer: Federal, State, Local not specified - PPO | Admitting: Anesthesiology

## 2015-04-25 ENCOUNTER — Encounter (HOSPITAL_BASED_OUTPATIENT_CLINIC_OR_DEPARTMENT_OTHER): Payer: Self-pay

## 2015-04-25 ENCOUNTER — Encounter (HOSPITAL_BASED_OUTPATIENT_CLINIC_OR_DEPARTMENT_OTHER)
Admission: RE | Disposition: A | Discharge status: Home / Self Care | Payer: Self-pay | Source: Ambulatory Visit | Attending: General Surgery

## 2015-04-25 DIAGNOSIS — Z170 Estrogen receptor positive status [ER+]: Secondary | ICD-10-CM | POA: Insufficient documentation

## 2015-04-25 DIAGNOSIS — C50912 Malignant neoplasm of unspecified site of left female breast: Secondary | ICD-10-CM | POA: Diagnosis present

## 2015-04-25 DIAGNOSIS — Z95828 Presence of other vascular implants and grafts: Secondary | ICD-10-CM

## 2015-04-25 DIAGNOSIS — C50412 Malignant neoplasm of upper-outer quadrant of left female breast: Secondary | ICD-10-CM | POA: Insufficient documentation

## 2015-04-25 DIAGNOSIS — K219 Gastro-esophageal reflux disease without esophagitis: Secondary | ICD-10-CM | POA: Insufficient documentation

## 2015-04-25 DIAGNOSIS — Z419 Encounter for procedure for purposes other than remedying health state, unspecified: Secondary | ICD-10-CM

## 2015-04-25 HISTORY — PX: PORTACATH PLACEMENT: SHX2246

## 2015-04-25 SURGERY — INSERTION, TUNNELED CENTRAL VENOUS DEVICE, WITH PORT
Anesthesia: General | Site: Chest | Laterality: Right

## 2015-04-25 MED ORDER — DEXAMETHASONE SODIUM PHOSPHATE 4 MG/ML IJ SOLN
INTRAMUSCULAR | Status: DC | PRN
  Administered 2015-04-25: 10 mg via INTRAVENOUS

## 2015-04-25 MED ORDER — MIDAZOLAM HCL 2 MG/2ML IJ SOLN
INTRAMUSCULAR | Status: AC
  Filled 2015-04-25: qty 2

## 2015-04-25 MED ORDER — CHLORHEXIDINE GLUCONATE 4 % EX LIQD: 1.0000 "application " | Freq: Once | CUTANEOUS | Status: DC

## 2015-04-25 MED ORDER — HEPARIN (PORCINE) IN NACL 2-0.9 UNIT/ML-% IJ SOLN
INTRAMUSCULAR | Status: AC
  Filled 2015-04-25: qty 500

## 2015-04-25 MED ORDER — BUPIVACAINE-EPINEPHRINE 0.25% -1:200000 IJ SOLN
INTRAMUSCULAR | Status: DC | PRN
  Administered 2015-04-25: 18 mL

## 2015-04-25 MED ORDER — FENTANYL CITRATE (PF) 100 MCG/2ML IJ SOLN
INTRAMUSCULAR | Status: AC
  Filled 2015-04-25: qty 4

## 2015-04-25 MED ORDER — CEFAZOLIN SODIUM-DEXTROSE 2-3 GM-% IV SOLR
2.0000 g | INTRAVENOUS | Status: AC
  Administered 2015-04-25: 2 g via INTRAVENOUS

## 2015-04-25 MED ORDER — BUPIVACAINE-EPINEPHRINE (PF) 0.25% -1:200000 IJ SOLN
INTRAMUSCULAR | Status: AC
  Filled 2015-04-25: qty 30

## 2015-04-25 MED ORDER — HYDROMORPHONE HCL 1 MG/ML IJ SOLN
INTRAMUSCULAR | Status: AC
  Filled 2015-04-25: qty 1

## 2015-04-25 MED ORDER — ONDANSETRON HCL 4 MG/2ML IJ SOLN
INTRAMUSCULAR | Status: DC | PRN
  Administered 2015-04-25: 4 mg via INTRAVENOUS

## 2015-04-25 MED ORDER — PROPOFOL 10 MG/ML IV BOLUS
INTRAVENOUS | Status: DC | PRN
  Administered 2015-04-25: 200 mg via INTRAVENOUS

## 2015-04-25 MED ORDER — HEPARIN (PORCINE) IN NACL 2-0.9 UNIT/ML-% IJ SOLN
INTRAMUSCULAR | Status: DC | PRN
  Administered 2015-04-25: 1 via INTRAVENOUS

## 2015-04-25 MED ORDER — HYDROMORPHONE HCL 1 MG/ML IJ SOLN
0.2500 mg | INTRAMUSCULAR | Status: DC | PRN
  Administered 2015-04-25 (×2): 0.5 mg via INTRAVENOUS

## 2015-04-25 MED ORDER — LACTATED RINGERS IV SOLN
INTRAVENOUS | Status: DC
  Administered 2015-04-25: 08:00:00 via INTRAVENOUS

## 2015-04-25 MED ORDER — LIDOCAINE HCL (CARDIAC) 20 MG/ML IV SOLN
INTRAVENOUS | Status: DC | PRN
  Administered 2015-04-25: 50 mg via INTRAVENOUS

## 2015-04-25 MED ORDER — FENTANYL CITRATE (PF) 100 MCG/2ML IJ SOLN
INTRAMUSCULAR | Status: DC | PRN
  Administered 2015-04-25 (×2): 100 ug via INTRAVENOUS

## 2015-04-25 MED ORDER — HYDROCODONE-ACETAMINOPHEN 5-325 MG PO TABS: 1.0000 | ORAL_TABLET | ORAL | Status: DC | PRN

## 2015-04-25 MED ORDER — HEPARIN SOD (PORK) LOCK FLUSH 100 UNIT/ML IV SOLN
INTRAVENOUS | Status: DC | PRN
  Administered 2015-04-25: 500 [IU] via INTRAVENOUS

## 2015-04-25 MED ORDER — BUPIVACAINE HCL (PF) 0.25 % IJ SOLN
INTRAMUSCULAR | Status: AC
  Filled 2015-04-25: qty 30

## 2015-04-25 MED ORDER — CEFAZOLIN SODIUM-DEXTROSE 2-3 GM-% IV SOLR
INTRAVENOUS | Status: AC
  Filled 2015-04-25: qty 50

## 2015-04-25 MED ORDER — MIDAZOLAM HCL 5 MG/5ML IJ SOLN
INTRAMUSCULAR | Status: DC | PRN
  Administered 2015-04-25: 2 mg via INTRAVENOUS

## 2015-04-25 MED ORDER — HEPARIN SOD (PORK) LOCK FLUSH 100 UNIT/ML IV SOLN
INTRAVENOUS | Status: AC
  Filled 2015-04-25: qty 5

## 2015-04-25 SURGICAL SUPPLY — 40 items
BAG DECANTER FOR FLEXI CONT (MISCELLANEOUS) ×2 IMPLANT
BANDAGE ADH SHEER 1  50/CT (GAUZE/BANDAGES/DRESSINGS) ×2 IMPLANT
BLADE SURG 15 STRL LF DISP TIS (BLADE) ×1 IMPLANT
BLADE SURG 15 STRL SS (BLADE) ×1
CHLORAPREP W/TINT 26ML (MISCELLANEOUS) ×2 IMPLANT
COVER BACK TABLE 60X90IN (DRAPES) ×2 IMPLANT
COVER MAYO STAND STRL (DRAPES) ×2 IMPLANT
DECANTER SPIKE VIAL GLASS SM (MISCELLANEOUS) IMPLANT
DRAPE C-ARM 42X72 X-RAY (DRAPES) ×2 IMPLANT
DRAPE LAPAROTOMY T 102X78X121 (DRAPES) ×2 IMPLANT
DRAPE UTILITY XL STRL (DRAPES) ×2 IMPLANT
ELECT REM PT RETURN 9FT ADLT (ELECTROSURGICAL) ×2
ELECTRODE REM PT RTRN 9FT ADLT (ELECTROSURGICAL) ×1 IMPLANT
GLOVE BIO SURGEON STRL SZ 6.5 (GLOVE) ×2 IMPLANT
GLOVE BIOGEL M STRL SZ7.5 (GLOVE) ×2 IMPLANT
GLOVE BIOGEL PI IND STRL 8 (GLOVE) ×2 IMPLANT
GLOVE BIOGEL PI INDICATOR 8 (GLOVE) ×2
GLOVE ECLIPSE 7.5 STRL STRAW (GLOVE) ×2 IMPLANT
GOWN STRL REUS W/ TWL LRG LVL3 (GOWN DISPOSABLE) IMPLANT
GOWN STRL REUS W/ TWL XL LVL3 (GOWN DISPOSABLE) ×2 IMPLANT
GOWN STRL REUS W/TWL LRG LVL3 (GOWN DISPOSABLE)
GOWN STRL REUS W/TWL XL LVL3 (GOWN DISPOSABLE) ×2
IV CATH PLACEMENT UNIT 16 GA (IV SOLUTION) IMPLANT
IV KIT MINILOC 20X1 SAFETY (NEEDLE) IMPLANT
KIT PORT POWER 8FR ISP CVUE (Catheter) ×2 IMPLANT
LIQUID BAND (GAUZE/BANDAGES/DRESSINGS) ×2 IMPLANT
NEEDLE HYPO 22GX1.5 SAFETY (NEEDLE) ×2 IMPLANT
NEEDLE HYPO 25X1 1.5 SAFETY (NEEDLE) ×2 IMPLANT
PACK BASIN DAY SURGERY FS (CUSTOM PROCEDURE TRAY) ×2 IMPLANT
PENCIL BUTTON HOLSTER BLD 10FT (ELECTRODE) ×2 IMPLANT
SET SHEATH INTRODUCER 10FR (MISCELLANEOUS) IMPLANT
SHEATH COOK PEEL AWAY SET 9F (SHEATH) IMPLANT
SLEEVE SCD COMPRESS KNEE MED (MISCELLANEOUS) ×2 IMPLANT
SUT MON AB 4-0 PC3 18 (SUTURE) ×2 IMPLANT
SUT PROLENE 2 0 CT2 30 (SUTURE) ×2 IMPLANT
SUT SILK 4 0 TIES 17X18 (SUTURE) IMPLANT
SYR 5ML LUER SLIP (SYRINGE) ×2 IMPLANT
SYR CONTROL 10ML LL (SYRINGE) ×2 IMPLANT
TOWEL OR 17X24 6PK STRL BLUE (TOWEL DISPOSABLE) ×4 IMPLANT
TOWEL OR NON WOVEN STRL DISP B (DISPOSABLE) ×2 IMPLANT

## 2015-04-25 NOTE — Anesthesia Postprocedure Evaluation (Signed)
  Anesthesia Post-op Note  Patient: Cheryl [name]  Procedure(s) Performed: Procedure(s): INSERTION PORT-A-CATH (Right)  Patient Location: PACU  Anesthesia Type:General  Level of Consciousness: awake  Airway and Oxygen Therapy: Patient Spontanous Breathing  Post-op Pain: mild  Post-op Assessment: Post-op Vital signs reviewed  Post-op Vital Signs: Reviewed  Last Vitals:  Filed Vitals:   04/25/15 0945  BP: 144/81  Pulse: 87  Temp: 37.3 C  Resp: 18    Complications: No apparent anesthesia complications

## 2015-04-25 NOTE — Anesthesia Preprocedure Evaluation (Addendum)
Anesthesia Evaluation  Patient identified by MRN, date of birth, ID band Patient awake    Reviewed: Allergy & Precautions, NPO status , Patient's Chart, lab work & pertinent test results  Airway Mallampati: II  TM Distance: >3 FB Neck ROM: Full    Dental   Pulmonary neg pulmonary ROS,  breath sounds clear to auscultation        Cardiovascular negative cardio ROS  Rhythm:Regular Rate:Normal     Neuro/Psych    GI/Hepatic negative GI ROS, Neg liver ROS,   Endo/Other  negative endocrine ROS  Renal/GU negative Renal ROS     Musculoskeletal   Abdominal   Peds  Hematology   Anesthesia Other Findings   Reproductive/Obstetrics                            Anesthesia Physical Anesthesia Plan  ASA: II  Anesthesia Plan:    Post-op Pain Management:    Induction: Intravenous  Airway Management Planned: LMA  Additional Equipment:   Intra-op Plan:   Post-operative Plan: Extubation in OR  Informed Consent: I have reviewed the patients History and Physical, chart, labs and discussed the procedure including the risks, benefits and alternatives for the proposed anesthesia with the patient or authorized representative who has indicated his/her understanding and acceptance.   Dental advisory given  Plan Discussed with: CRNA and Anesthesiologist  Anesthesia Plan Comments:         Anesthesia Quick Evaluation

## 2015-04-25 NOTE — H&P (Signed)
History of Present Illness Cheryl Kitchen T. Matheo Rathbone MD; 02/20/2015 9:52 AM) The patient is a 39 year old female who presents with breast cancer. [name] is a 39 YO pre menopausal female referred by Dr. Enrique Sack for evaluation of recently diagnosed carcinoma of the left breast. she had an initial screening mammogram about 5 years ago at age 36. this was negative. She recently presented for a screening mamogram revealing a new area of suspicious calcifications in the upper outer left breast measuring 4.2 cm in greatest diameter. Subsequent imaging included diagnostic mamogram confirming these findings and ultrasound showing no discrete masses. A stereotactic biopsy was performed on 02/08/2015 with pathology revealing intermediate to high grade ductal carcinoma in situ with probably areas of invasive disease as well. subsequent bilateral breast MRI was performed with findings including a 4.5 cm area of enhancement at the site which includedancy in the upper outer left breast which included a 1 cm spiculated mass which did not contain the biopsy clip. Also seen for an indeterminate oval mass in the central left breast and indeterminate mass in the outer right breast. Follow-up ultrasound and biopsy was done for these 2 masses at which time an additional left breast mass was seen and all 3 areas were biopsied. These were all benign showing a fibroadenoma at the 9:00 position of the right breast, a fibroadenoma at the 10:00 position of the left breast and pseudo-angiomatous stromal hyperplasia at 2:00 in the left breast. [name] seen now in reast cancer multidisciplinary clinic for initial treatment planning. She has experienced no breast symptoms,, specifically lump or pain or skin change or nipple discharge. She does not have a personal history of any previous breast problems. Findings at that time were the following: Tumor size: 4.5 cm Tumor grade: intermediate to high-grade Estrogen Receptor: positive  Progesterone Receptor: positive Her-2 neu: negative Lymph node status: negative She has completed her initial surgery with lumpectomy and sentinel lymph node biopsy. She has a greater than 1 cm invasive tumor and after oncotype testing chemotherapy is planned. She will require Port-A-Cath placement.  Other Problems Anderson Malta [place], [place]; 02/20/2015 7:51 AM) Breast Cancer Gastroesophageal Reflux Disease Lump In Breast  Past Surgical History Anderson Malta [place], RMA; 02/20/2015 7:51 AM) Breast Biopsy Bilateral.  Diagnostic Studies History Anderson Malta [place], RMA; 02/20/2015 7:51 AM) Colonoscopy never Mammogram within last year Pap Smear 1-5 years ago  Social History Anderson Malta [place], RMA; 02/20/2015 7:51 AM) Alcohol use Occasional alcohol use. Caffeine use Carbonated beverages, Coffee, Tea. No drug use Tobacco use Never smoker.  Family History Anderson Malta [place], [place]; 02/20/2015 7:51 AM) Arthritis Father. Bleeding disorder Daughter. Cancer Family Members In General. Cerebrovascular Accident Family Members In General. Diabetes Mellitus Family Members In General, Mother. Heart disease in female family member before age 28 Hypertension Family Members In General, Mother. Migraine Headache Mother. Respiratory Condition Family Members In General. Seizure disorder Family Members In General.  Pregnancy / Birth History Jeanann [place], [place]; 02/20/2015 7:51 AM) Age at menarche 59 years. Contraceptive History Contraceptive implant, Intrauterine device, Oral contraceptives. Gravida 4 Maternal age 48-20 Para 3 Regular periods  Review of Systems Anderson Malta [place] RMA; 02/20/2015 7:51 AM) General Present- Fatigue and Night Sweats. Not Present- Appetite Loss, Chills, Fever, Weight Gain and Weight Loss. HEENT Present- Wears glasses/contact lenses. Not Present- Earache, Hearing Loss, Hoarseness, Nose Bleed, Oral Ulcers, Ringing in the Ears, Seasonal Allergies, Sinus Pain, Sore Throat,  Visual Disturbances and Yellow Eyes. Respiratory Not Present- Bloody sputum, Chronic Cough, Difficulty Breathing, Snoring and Wheezing. Breast Not  Present- Breast Mass, Breast Pain, Nipple Discharge and Skin Changes. Cardiovascular Not Present- Chest Pain, Difficulty Breathing Lying Down, Leg Cramps, Palpitations, Rapid Heart Rate, Shortness of Breath and Swelling of Extremities. Gastrointestinal Present- Indigestion. Not Present- Abdominal Pain, Bloating, Bloody Stool, Change in Bowel Habits, Chronic diarrhea, Constipation, Difficulty Swallowing, Excessive gas, Gets full quickly at meals, Hemorrhoids, Nausea, Rectal Pain and Vomiting. Female Genitourinary Not Present- Frequency, Nocturia, Painful Urination, Pelvic Pain and Urgency. Musculoskeletal Not Present- Back Pain, Joint Pain, Joint Stiffness, Muscle Pain, Muscle Weakness and Swelling of Extremities. Neurological Present- Headaches. Not Present- Decreased Memory, Fainting, Numbness, Seizures, Tingling, Tremor, Trouble walking and Weakness. Psychiatric Present- Change in Sleep Pattern. Not Present- Anxiety, Bipolar, Depression, Fearful and Frequent crying. Endocrine Present- Hot flashes. Not Present- Cold Intolerance, Excessive Hunger, Hair Changes, Heat Intolerance and New Diabetes. Hematology Not Present- Easy Bruising, Excessive bleeding, Gland problems, HIV and Persistent Infections.   Physical Exam Cheryl Kitchen T. Dallys Nowakowski MD; 02/20/2015 9:53 AM) The physical exam findings are as follows: Note:General: Alert, mmoderately obese Caucasian female, in no distress Skin: Warm and dry without rash or infection. HEENT: No palpable masses or thyromegaly. Sclera nonicteric. Pupils equal round and reactive. Oropharynx clear. Lymph nodes: No cervical, supraclavicular, or inguinal nodes palpable. breasts: Large breasts bilaterally with mild grade 1 ptosis. I cannot feel any discrete massthy.n either breast. No palpable axillary adenopathy. Lungs:  Breath sounds clear and equal. No wheezing or increased work of breathing. Cardiovascular: Regular rate and rhythm without murmer. No JVD or edema. Peripheral pulses intact. No carotid bruits. Abdomen: Nondistended. Soft and nontender. No masses palpable. No organomegaly. No palpable hernias. Extremities: No edema or joint swelling or deformity. No chronic venous stasis changes. Neurologic: Alert and fully oriented. Gait normal. No focal weakness. Psychiatric: Normal mood and affect. Thought content appropriate with normal judgement and insight    Assessment & Plan Cheryl Kitchen T. Avelino Herren MD; 02/20/2015 10:02 AM) BREAST CANCER, LEFT (174.9  C50.912) MALIGNANT NEOPLASM OF UPPER-OUTER QUADRANT OF LEFT FEMALE BREAST (174.4  C50.412) Current Plans  Schedule for Surgery Placement of Port-A-Cath. I discussed with the patient the indications and nature of the procedure as well as risks of bleeding, infection, pneumothorax, catheter displacement, thrombosis. All her questions were answered and she is in agreement. Edward Jolly MD, FACS  04/25/2015, 7:37 AM

## 2015-04-25 NOTE — Interval H&P Note (Signed)
History and Physical Interval Note:  04/25/2015 7:37 AM  Cheryl [name]  has presented today for surgery, with the diagnosis of cancer of the breast  The various methods of treatment have been discussed with the patient and family. After consideration of risks, benefits and other options for treatment, the patient has consented to  Procedure(s): INSERTION PORT-A-CATH (N/A) as a surgical intervention .  The patient's history has been reviewed, patient examined, no change in status, stable for surgery.  I have reviewed the patient's chart and labs.  Questions were answered to the patient's satisfaction.     Elmire Amrein T

## 2015-04-25 NOTE — Op Note (Signed)
Preoperative diagnosis: Cancer of the breast and the poor venous access  Postoperative diagnosis: Same  Procedure: Placement of ClearVue subcutaneous venous port  Surgeon: [name] M.D.  Anesthesia: LMA general  Description of procedure: Patient is brought to the operating room and placed in the supine position on the operating table. IV sedation was administered. The entire upper chest and neck were widely sterilely prepped and draped. Local anesthesia was used to infiltrate the insertion of port site. I was initially unable to cannulate the right subclavian vein.  The right internal jugular vein was cannulated with a needle and guidewire without difficulty and position in the superior vena cava was confirmed by fluoroscopy. The introducer was then placed over the guidewire and the flushed catheter placed via the introducer which was stripped away and the tip of the catheter positioned near the cavoatrial junction. A small transverse incision was made in the anterior chest wall and subcutaneous pocket created. The catheter was tunneled into the pocket, trimmed to length, and attached to the flushed port which was positioned in the pocket. The port was sutured to the chest wall with interrupted 2-0 Prolene. The incisions were closed with subcutaneous interrupted Monocryl and the skin incisions closed with subcuticular Monocryl and Dermabond. The port was accessed and flushed and aspirated easily and was left flushed with concentrated heparin solution. Sponge needle as the counts were correct. The patient was taken to recovery in good condition.  Linn Clavin T  04/25/2015

## 2015-04-25 NOTE — Transfer of Care (Signed)
Immediate Anesthesia Transfer of Care Note  Patient: Cheryl [name]  Procedure(s) Performed: Procedure(s): INSERTION PORT-A-CATH (Right)  Patient Location: PACU  Anesthesia Type:General  Level of Consciousness: awake, alert  and oriented  Airway & Oxygen Therapy: Patient Spontanous Breathing and Patient connected to face mask oxygen  Post-op Assessment: Report given to RN and Post -op Vital signs reviewed and stable  Post vital signs: Reviewed and stable  Last Vitals:  Filed Vitals:   04/25/15 0652  BP: 124/74  Pulse: 81  Temp: 36.9 C  Resp: 20    Complications: No apparent anesthesia complications

## 2015-04-25 NOTE — Discharge Instructions (Signed)
Post Anesthesia Home Care Instructions  Activity: Get plenty of rest for the remainder of the day. A responsible adult should stay with you for 24 hours following the procedure.  For the next 24 hours, DO NOT: -Drive a car -Paediatric nurse -Drink alcoholic beverages -Take any medication unless instructed by your physician -Make any legal decisions or sign important papers.  Meals: Start with liquid foods such as gelatin or soup. Progress to regular foods as tolerated. Avoid greasy, spicy, heavy foods. If nausea and/or vomiting occur, drink only clear liquids until the nausea and/or vomiting subsides. Call your physician if vomiting continues.  Special Instructions/Symptoms: Your throat may feel dry or sore from the anesthesia or the breathing tube placed in your throat during surgery. If this causes discomfort, gargle with warm salt water. The discomfort should disappear within 24 hours.  If you had a scopolamine patch placed behind your ear for the management of post- operative nausea and/or vomiting:  1. The medication in the patch is effective for 72 hours, after which it should be removed.  Wrap patch in a tissue and discard in the trash. Wash hands thoroughly with soap and water. 2. You may remove the patch earlier than 72 hours if you experience unpleasant side effects which may include dry mouth, dizziness or visual disturbances. 3. Avoid touching the patch. Wash your hands with soap and water after contact with the patch.      PORT-A-CATH: POST OP INSTRUCTIONS  Always review your discharge instruction sheet given to you by the facility where your surgery was performed.   1. A prescription for pain medication may be given to you upon discharge. Take your pain medication as prescribed, if needed. If narcotic pain medicine is not needed, then you make take acetaminophen (Tylenol) or ibuprofen (Advil) as needed.  2. Take your usually prescribed medications unless otherwise  directed. 3. If you need a refill on your pain medication, please contact our office. All narcotic pain medicine now requires a paper prescription.  Phoned in and fax refills are no longer allowed by law.  Prescriptions will not be filled after 5 pm or on weekends.  4. You should follow a light diet for the remainder of the day after your procedure. 5. Most patients will experience some mild swelling and/or bruising in the area of the incision. It may take several days to resolve. 6. It is common to experience some constipation if taking pain medication after surgery. Increasing fluid intake and taking a stool softener (such as Colace) will usually help or prevent this problem from occurring. A mild laxative (Milk of Magnesia or Miralax) should be taken according to package directions if there are no bowel movements after 48 hours.  7. Unless discharge instructions indicate otherwise, you may remove your bandages 48 hours after surgery, and you may shower at that time. You may have steri-strips (small white skin tapes) in place directly over the incision.  These strips should be left on the skin for 7-10 days.  If your surgeon used Dermabond (skin glue) on the incision, you may shower in 24 hours.  The glue will flake off over the next 2-3 weeks.  8. If your port is left accessed at the end of surgery (needle left in port), the dressing cannot get wet and should only by changed by a healthcare professional. When the port is no longer accessed (when the needle has been removed), follow step 7.   9. ACTIVITIES:  Limit activity involving your arms  7.   9. ACTIVITIES:  Limit activity involving your arms for the next 72 hours. Do no strenuous exercise or activity for 1 week. You may drive when you are no longer taking prescription pain medication, you can comfortably wear a seatbelt, and you can maneuver your car. 10.You may need to see your doctor in the office for a follow-up appointment.  Please       check with your doctor.  11.When  you receive a new Port-a-Cath, you will get a product guide and        ID card.  Please keep them in case you need them.  WHEN TO CALL YOUR DOCTOR ([phone]): 1. Fever over 101.0 2. Chills 3. Continued bleeding from incision 4. Increased redness and tenderness at the site 5. Shortness of breath, difficulty breathing   The clinic staff is available to answer your questions during regular business hours. Please don't hesitate to call and ask to speak to one of the nurses or medical assistants for clinical concerns. If you have a medical emergency, go to the nearest emergency room or call 911.  A surgeon from [facility] is always on call at the hospital.     For further information, please visit [email or web address]     

## 2015-04-25 NOTE — Anesthesia Postprocedure Evaluation (Signed)
  Anesthesia Post-op Note  Patient: Cheryl [name]  Procedure(s) Performed: Procedure(s): INSERTION PORT-A-CATH (Right)  Patient Location: PACU  Anesthesia Type:General  Level of Consciousness: awake  Airway and Oxygen Therapy: Patient Spontanous Breathing  Post-op Pain: mild  Post-op Assessment: Post-op Vital signs reviewed  Post-op Vital Signs: Reviewed  Last Vitals:  Filed Vitals:   04/25/15 0845  BP: 139/84  Pulse: 109  Temp: 37.1 C  Resp: 19    Complications: No apparent anesthesia complications

## 2015-04-25 NOTE — Anesthesia Procedure Notes (Signed)
Procedure Name: LMA Insertion Date/Time: 04/25/2015 7:45 AM Performed by: Melynda Ripple D Pre-anesthesia Checklist: Patient identified, Emergency Drugs available, Suction available and Patient being monitored Patient Re-evaluated:Patient Re-evaluated prior to inductionOxygen Delivery Method: Circle System Utilized Preoxygenation: Pre-oxygenation with 100% oxygen Intubation Type: IV induction Ventilation: Mask ventilation without difficulty LMA: LMA inserted LMA Size: 4.0 Number of attempts: 1 Airway Equipment and Method: Bite block Placement Confirmation: positive ETCO2 Tube secured with: Tape Dental Injury: Teeth and Oropharynx as per pre-operative assessment

## 2015-04-26 ENCOUNTER — Ambulatory Visit (HOSPITAL_COMMUNITY)
Admission: RE | Admit: 2015-04-26 | Discharge: 2015-04-26 | Disposition: A | Discharge status: Home / Self Care | Payer: Federal, State, Local not specified - PPO | Source: Ambulatory Visit | Attending: Oncology | Admitting: Oncology

## 2015-04-26 ENCOUNTER — Other Ambulatory Visit: Payer: Federal, State, Local not specified - PPO

## 2015-04-26 ENCOUNTER — Encounter (HOSPITAL_BASED_OUTPATIENT_CLINIC_OR_DEPARTMENT_OTHER): Payer: Self-pay | Admitting: General Surgery

## 2015-04-26 DIAGNOSIS — Z01810 Encounter for preprocedural cardiovascular examination: Secondary | ICD-10-CM | POA: Insufficient documentation

## 2015-04-26 DIAGNOSIS — C50412 Malignant neoplasm of upper-outer quadrant of left female breast: Secondary | ICD-10-CM | POA: Insufficient documentation

## 2015-04-26 NOTE — Progress Notes (Signed)
  Echocardiogram 2D Echocardiogram has been performed.  Donata Clay 04/26/2015, 12:40 PM

## 2015-04-30 ENCOUNTER — Other Ambulatory Visit: Payer: Self-pay | Admitting: *Deleted

## 2015-04-30 DIAGNOSIS — C50412 Malignant neoplasm of upper-outer quadrant of left female breast: Secondary | ICD-10-CM

## 2015-05-01 ENCOUNTER — Encounter: Payer: Self-pay | Admitting: Nurse Practitioner

## 2015-05-01 ENCOUNTER — Telehealth: Payer: Self-pay | Admitting: Nurse Practitioner

## 2015-05-01 ENCOUNTER — Other Ambulatory Visit (HOSPITAL_BASED_OUTPATIENT_CLINIC_OR_DEPARTMENT_OTHER): Payer: Federal, State, Local not specified - PPO

## 2015-05-01 ENCOUNTER — Ambulatory Visit (HOSPITAL_BASED_OUTPATIENT_CLINIC_OR_DEPARTMENT_OTHER): Payer: Federal, State, Local not specified - PPO | Admitting: Nurse Practitioner

## 2015-05-01 VITALS — BP 135/88 | HR 79 | Temp 99.3°F | Resp 18 | Ht 62.0 in | Wt 215.3 lb

## 2015-05-01 DIAGNOSIS — C50412 Malignant neoplasm of upper-outer quadrant of left female breast: Secondary | ICD-10-CM

## 2015-05-01 DIAGNOSIS — Z171 Estrogen receptor negative status [ER-]: Secondary | ICD-10-CM | POA: Diagnosis not present

## 2015-05-01 LAB — CBC WITH DIFFERENTIAL/PLATELET
BASO%: 0.3 % (ref 0.0–2.0)
Basophils Absolute: 0 10*3/uL (ref 0.0–0.1)
EOS%: 2 % (ref 0.0–7.0)
Eosinophils Absolute: 0.2 10*3/uL (ref 0.0–0.5)
HEMATOCRIT: 35 % (ref 34.8–46.6)
HEMOGLOBIN: 11.8 g/dL (ref 11.6–15.9)
LYMPH#: 3.5 10*3/uL — AB (ref 0.9–3.3)
LYMPH%: 30.5 % (ref 14.0–49.7)
MCH: 29.9 pg (ref 25.1–34.0)
MCHC: 33.7 g/dL (ref 31.5–36.0)
MCV: 88.6 fL (ref 79.5–101.0)
MONO#: 0.5 10*3/uL (ref 0.1–0.9)
MONO%: 4.6 % (ref 0.0–14.0)
NEUT#: 7.2 10*3/uL — ABNORMAL HIGH (ref 1.5–6.5)
NEUT%: 62.6 % (ref 38.4–76.8)
Platelets: 291 10*3/uL (ref 145–400)
RBC: 3.95 10*6/uL (ref 3.70–5.45)
RDW: 13.4 % (ref 11.2–14.5)
WBC: 11.4 10*3/uL — ABNORMAL HIGH (ref 3.9–10.3)
nRBC: 0 % (ref 0–0)

## 2015-05-01 LAB — COMPREHENSIVE METABOLIC PANEL (CC13)
ALK PHOS: 51 U/L (ref 40–150)
ALT: 9 U/L (ref 0–55)
AST: 12 U/L (ref 5–34)
Albumin: 3.2 g/dL — ABNORMAL LOW (ref 3.5–5.0)
Anion Gap: 10 mEq/L (ref 3–11)
BUN: 9.9 mg/dL (ref 7.0–26.0)
CO2: 26 meq/L (ref 22–29)
CREATININE: 0.8 mg/dL (ref 0.6–1.1)
Calcium: 9 mg/dL (ref 8.4–10.4)
Chloride: 105 mEq/L (ref 98–109)
EGFR: >90 mL/min/{1.73_m2} (ref >90)
Glucose: 154 mg/dl — ABNORMAL HIGH (ref 70–140)
Potassium: 4.1 mEq/L (ref 3.5–5.1)
Sodium: 141 mEq/L (ref 136–145)
TOTAL PROTEIN: 6.5 g/dL (ref 6.4–8.3)
Total Bilirubin: 0.44 mg/dL (ref 0.20–1.20)

## 2015-05-01 MED ORDER — DEXAMETHASONE 4 MG PO TABS: ORAL_TABLET | ORAL | Status: DC

## 2015-05-01 MED ORDER — PROCHLORPERAZINE MALEATE 10 MG PO TABS: 10.0000 mg | ORAL_TABLET | Freq: Four times a day (QID) | ORAL | Status: DC | PRN

## 2015-05-01 MED ORDER — ONDANSETRON HCL 8 MG PO TABS: 8.0000 mg | ORAL_TABLET | Freq: Two times a day (BID) | ORAL | Status: DC | PRN

## 2015-05-01 MED ORDER — LIDOCAINE-PRILOCAINE 2.5-2.5 % EX CREA: TOPICAL_CREAM | CUTANEOUS | Status: DC

## 2015-05-01 MED ORDER — LORAZEPAM 0.5 MG PO TABS: 0.5000 mg | ORAL_TABLET | Freq: Every day | ORAL | Status: DC

## 2015-05-01 NOTE — Telephone Encounter (Signed)
Appointments made and avs will be printed at first chemo appointment 05/01/15

## 2015-05-01 NOTE — Progress Notes (Signed)
Cheryl [name]  [phone] Fax:([phone]) [phone]     ID: Cheryl [name] DOB: [birth year]  MR#: [id number]  TRR#:[id number]  Patient Care Team: No Pcp Per Patient as PCP - General (General Practice) [name], MD as Consulting Physician (Obstetrics and Gynecology) [name], MD as Consulting Physician (General Surgery) [name], MD as Consulting Physician (Oncology) [name], MD as Consulting Physician (Radiation Oncology) [name], RN as Registered Nurse [name], RN as Registered Nurse [name], NP as Nurse Practitioner (Nurse Practitioner) PCP: No PCP Per Patient OTHER MD:  CHIEF COMPLAINT: Left breast cancer  CURRENT TREATMENT: Adjuvant chemotherapy   BREAST CANCER HISTORY: From the original intake note:  The patient had screening mammography 02/01/2015 which showed a calcifications in the upper outer quadrant of the left breast. On 02/08/2015 she underwent left diagnostic mammography with tomosynthesis and left breast ultrasonography at the breast Center. The breast density was category C. Mammography confirmed a group of pleomorphic calcifications spanning 4.2 cm maximally. Physical examination of the upper outer quadrant of the left breast showed an area of thickening at the approximate 2:00 position. Ultrasound of this area showed no discrete masses. There was vague shadowing present. Calcifications could not be clearly identified sonographically. There was no lymphadenopathy in the left axilla.  On the same day the patient underwent biopsy of the left breast area in question, with the pathology ([id number]) showing ductal carcinoma in situ, grade 2 or 3, with possible areas of microinvasion, the cancer cells being strongly estrogen and progesterone receptor positive, both at 100%.  On 02/15/2015 the patient underwent bilateral breast MRI. This showed the area of malignancy in the upper outer quadrant  to measure 4.5 cm, including a large area of non-masslike enhancement and a 1 cm spiculated mass. There was also an indeterminate oval mass in the central left breast and another indeterminant mass in the slightly outer right breast anteriorly. Biopsy of the right breast mass 02/18/2015 ([id number]) showed a fibroadenoma, with no evidence of malignancy. Biopsy of 2 additional areas of the left breast (at 10:00 and 2:00) showed a fibroadenoma in the upper inner quadrant, and pseudo-angiomatous stromal hyperplasia (PA SH) at the 2:00 area of the left breast.  The patient's subsequent history is as detailed below.  INTERVAL HISTORY: Cheryl [name] returns today for follow-up of her triple negative invasive breast cancer. She begins cyclophosphamide and doxorubicin tomorrow. [name] here to discuss her antiemetic schedule. She had an echocardiogram late last week that showed an excellent ejection fraction. She had a right portacath placed last week and is healing well from this. The pain is minimal at this point.  REVIEW OF SYSTEMS: A detailed review of systems is otherwise stable, except where noted above.  PAST MEDICAL HISTORY: Past Medical History  Diagnosis Date  . Wears contact lenses   . Breast cancer     left breast    PAST SURGICAL HISTORY: Past Surgical History  Procedure Laterality Date  . Breast lumpectomy with needle localization and axillary sentinel lymph node bx Left 03/27/2015    Procedure: LEFT BREAST LUMPECTOMY WITH BRACKETED NEEDLE LOCALIZATION AND LEFT  AXILLARY SENTINEL LYMPH NODE BX;  Surgeon: [name], MD;  Location: [facility] [facility];  Service: General;  Laterality: Left;  . Breast surgery    . Re-excision of breast lumpectomy Left 04/05/2015    Procedure: RE-EXCISION LEFT  BREAST LUMPECTOMY;  Surgeon: [name], MD;  Location: [place];  Service: General;  Laterality: Left;  . Portacath placement Right 04/25/2015    Procedure: INSERTION  PORT-A-CATH;  Surgeon: [name], MD;  Location: [place];  Service: General;  Laterality: Right;    FAMILY HISTORY Family History  Problem Relation Age of Onset  . Lung cancer Maternal Grandmother 71    non smoker  . Lung cancer Maternal Grandfather 45    non smoker worked in [employer]  . Cancer Paternal Grandfather 35    throat cancer ? smoker  . Cancer Cousin 41    throat cancer ? smoker   the patient's parents are living, in their mid-50s as of March 2016. The patient had no siblings. Both the patient's mother's parents died from lung cancer although they did not smoke. [place] and her maternal grandfather was a Ecologist. There is no history of breast or ovarian cancer in the family to her knowledge  GYNECOLOGIC HISTORY:  Patient's last menstrual period was 04/02/2015. Menarche age 9, first live birth age 38. The patient is GX P3. [name] having regular periods. She uses the Essure device for contraception.  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 Her husband Cheryl [name] is a Freight forwarder. Their daughter Cheryl [name] lives in [place] where she works in Press photographer. Daughter Cheryl [name] is a Research scientist (physical sciences) and lives with the patient.. Daughter Cheryl [name] also at home is 68 years old.    ADVANCED DIRECTIVES: Not in place   HEALTH MAINTENANCE: History  Substance Use Topics  . Smoking status: Never Smoker   . Smokeless tobacco: Never Used  . Alcohol Use: Yes     Colonoscopy:  PAP:  Bone density:  Lipid panel:  Allergies  Allergen Reactions  . Adhesive [Tape] Other (See Comments)    Skin blisters    Current Outpatient Prescriptions  Medication Sig Dispense Refill  . dexamethasone (DECADRON) 4 MG tablet Take 2 tablets by mouth once a day on the day after chemotherapy and then take 2 tablets two times a day for 2 days. Take with food. 30 tablet 1  . HYDROcodone-acetaminophen (NORCO/VICODIN) 5-325 MG per tablet  Take 1-2 tablets by mouth every 4 (four) hours as needed for moderate pain or severe pain. (Patient not taking: Reported on 05/01/2015) 25 tablet 0  . ibuprofen (ADVIL,MOTRIN) 400 MG tablet Take 400 mg by mouth every 6 (six) hours as needed.    . lidocaine-prilocaine (EMLA) cream Apply to affected area once 30 g 3  . LORazepam (ATIVAN) 0.5 MG tablet Take 1 tablet (0.5 mg total) by mouth at bedtime. 30 tablet 0  . ondansetron (ZOFRAN) 8 MG tablet Take 1 tablet (8 mg total) by mouth 2 (two) times daily as needed. Start on the third day after chemotherapy. 30 tablet 1  . prochlorperazine (COMPAZINE) 10 MG tablet Take 1 tablet (10 mg total) by mouth every 6 (six) hours as needed (Nausea or vomiting). 30 tablet 1   No current facility-administered medications for this visit.    OBJECTIVE: There-appearing white woman who appears well Filed Vitals:   05/01/15 1420  BP: 135/88  Pulse: 79  Temp: 99.3 F (37.4 C)  Resp: 18     Body mass index is 39.37 kg/(m^2).    ECOG FS:0 - Asymptomatic  Skin: warm, dry  HEENT: sclerae anicteric, conjunctivae pink, oropharynx clear. No thrush or mucositis.  Lymph Nodes: No cervical or supraclavicular lymphadenopathy  Lungs: clear to auscultation bilaterally, no rales, wheezes,  or rhonci  Heart: regular rate and rhythm  Abdomen: round, soft, non tender, positive bowel sounds  Musculoskeletal: No focal spinal tenderness, no peripheral edema  Neuro: non focal, well oriented, positive affect  Breasts: deferred. New right chest port a cath clean, dry, and healing well   LAB RESULTS:  CMP     Component Value Date/Time   NA 141 05/01/2015 1348   K 4.1 05/01/2015 1348   CO2 26 05/01/2015 1348   GLUCOSE 154* 05/01/2015 1348   BUN 9.9 05/01/2015 1348   CREATININE 0.8 05/01/2015 1348   CALCIUM 9.0 05/01/2015 1348   PROT 6.5 05/01/2015 1348   ALBUMIN 3.2* 05/01/2015 1348   AST 12 05/01/2015 1348   ALT 9 05/01/2015 1348   ALKPHOS 51 05/01/2015 1348   BILITOT  0.44 05/01/2015 1348    INo results found for: SPEP, UPEP  Lab Results  Component Value Date   WBC 11.4* 05/01/2015   NEUTROABS 7.2* 05/01/2015   HGB 11.8 05/01/2015   HCT 35.0 05/01/2015   MCV 88.6 05/01/2015   PLT 291 05/01/2015      Chemistry      Component Value Date/Time   NA 141 05/01/2015 1348   K 4.1 05/01/2015 1348   CO2 26 05/01/2015 1348   BUN 9.9 05/01/2015 1348   CREATININE 0.8 05/01/2015 1348      Component Value Date/Time   CALCIUM 9.0 05/01/2015 1348   ALKPHOS 51 05/01/2015 1348   AST 12 05/01/2015 1348   ALT 9 05/01/2015 1348   BILITOT 0.44 05/01/2015 1348       No results found for: LABCA2  No components found for: LABCA125  No results for input(s): INR in the last 168 hours.  Urinalysis No results found for: COLORURINE, APPEARANCEUR, LABSPEC, PHURINE, GLUCOSEU, HGBUR, BILIRUBINUR, KETONESUR, PROTEINUR, UROBILINOGEN, NITRITE, LEUKOCYTESUR  STUDIES: Dg Chest Port 1 View  04/25/2015   CLINICAL DATA:  Breast carcinoma and status post Port-A-Cath placement.  EXAM: PORTABLE CHEST - 1 VIEW  COMPARISON:  None.  FINDINGS: Right jugular Port-A-Cath has been placed with the catheter tip at the level of the SVC. No pneumothorax present. Lungs show mild bibasilar atelectasis. There is no evidence of pulmonary edema, consolidation, nodule or pleural fluid. The heart size is normal.  IMPRESSION: Tip of Port-A-Cath in the SVC.  No pneumothorax.   Electronically Signed   By: Aletta Edouard M.D.   On: 04/25/2015 08:56   Dg Fluoro Guide Cv Line-no Report  04/25/2015   CLINICAL DATA:    FLOURO GUIDE CV LINE  Fluoroscopy was utilized by the requesting physician.  No radiographic  interpretation.     ASSESSMENT: 39 y.o. [place], [place] woman status post left breast biopsy 02/08/2015 for ductal carcinoma in situ, grade 2 or 3, strongly estrogen and progesterone receptor positive, with likely areas of microinvasion  (a) biopsy of an area in the left breast upper outer  quadrant showed PASH  (b) biopsy of 2 additional questionable areas, one in each breast, showed bilateral fibroadenomas  (1) genetics testing March 2016 through the BreastNext gene panel offered by [employer] showed no mutations in the following 17 genes: ATM, BARD1, BRCA1, BRCA2, BRIP1, CDH1, CHEK2, MRE11A, MUTYH, NBN, NF1, PALB2, PTEN, RAD50, RAD51C, RAD51D, and TP53.  (2) status post left lumpectomy with sentinel lymph node sampling 03/27/2015 for a pT1c pN0, stage IA invasive ductal carcinoma, grade 3, triple negative, with an MIB-1 of 33%  (a) close margins were cleared with subsequent excision 04/05/2015.  (3)  Oncotype DX score of 38 predicts a risk of 26% outside the breast recurrence within 10 years if the patient's only systemic treatment is tamoxifen for 5 years  (4) adjuvant chemotherapy will consist of doxorubicin and cyclophosphamide in dose dense fashion 4, with onpro support, followed by paclitaxel weekly 12.  (5) adjuvant radiation to follow chemotherapy  (5) tamoxifen started 02/20/2015 (neoadjuvantly), discontinued 04/19/2015  PLAN: Saquoia and I spent about 25 minutes discussing her upcoming chemotherapy plans. She was made aware of potential side effects and toxicities of both cyclophosphamide and doxorubicin. She attended chemotherapy school earlier this week so a lot of this information was a review for her. She was made aware of her neulasta injections, including the fact that these will be administered with an onbody injection that she will need to wear until the device is no longer active.   Finally, we reviewed her antiemetic schedule, and she feels comfortable with every medication listed as well as the indication and potential side effects. These prescriptions were sent to her pharmacy today.  Cheryl [name] will return on Thursday for her first cycle of treatment. She understands and agrees with this plan. She knows the goal of treatment in her case is cure. She has  been encouraged to call with any issues that might arise before her next visit here.  Laurie Panda, NP   05/01/2015 3:24 PM

## 2015-05-02 ENCOUNTER — Ambulatory Visit (HOSPITAL_BASED_OUTPATIENT_CLINIC_OR_DEPARTMENT_OTHER): Payer: Federal, State, Local not specified - PPO

## 2015-05-02 ENCOUNTER — Other Ambulatory Visit: Payer: Self-pay | Admitting: Oncology

## 2015-05-02 ENCOUNTER — Encounter: Payer: Self-pay | Admitting: *Deleted

## 2015-05-02 DIAGNOSIS — Z5111 Encounter for antineoplastic chemotherapy: Secondary | ICD-10-CM

## 2015-05-02 DIAGNOSIS — C50412 Malignant neoplasm of upper-outer quadrant of left female breast: Secondary | ICD-10-CM

## 2015-05-02 DIAGNOSIS — Z5189 Encounter for other specified aftercare: Secondary | ICD-10-CM | POA: Diagnosis not present

## 2015-05-02 MED ORDER — DOXORUBICIN HCL CHEMO IV INJECTION 2 MG/ML
60.0000 mg/m2 | Freq: Once | INTRAVENOUS | Status: AC
  Administered 2015-05-02: 124 mg via INTRAVENOUS
  Filled 2015-05-02: qty 62

## 2015-05-02 MED ORDER — HEPARIN SOD (PORK) LOCK FLUSH 100 UNIT/ML IV SOLN
500.0000 [IU] | Freq: Once | INTRAVENOUS | Status: AC | PRN
  Administered 2015-05-02: 500 [IU]
  Filled 2015-05-02: qty 5

## 2015-05-02 MED ORDER — PALONOSETRON HCL INJECTION 0.25 MG/5ML
0.2500 mg | Freq: Once | INTRAVENOUS | Status: AC
Start: 2015-05-02 — End: 2015-05-02
  Administered 2015-05-02: 0.25 mg via INTRAVENOUS

## 2015-05-02 MED ORDER — FOSAPREPITANT DIMEGLUMINE INJECTION 150 MG
Freq: Once | INTRAVENOUS | Status: AC
  Administered 2015-05-02: 13:00:00 via INTRAVENOUS
  Filled 2015-05-02: qty 5

## 2015-05-02 MED ORDER — SODIUM CHLORIDE 0.9 % IJ SOLN
10.0000 mL | INTRAMUSCULAR | Status: DC | PRN
  Administered 2015-05-02: 10 mL
  Filled 2015-05-02: qty 10

## 2015-05-02 MED ORDER — SODIUM CHLORIDE 0.9 % IV SOLN
Freq: Once | INTRAVENOUS | Status: AC
  Administered 2015-05-02: 13:00:00 via INTRAVENOUS

## 2015-05-02 MED ORDER — PALONOSETRON HCL INJECTION 0.25 MG/5ML
INTRAVENOUS | Status: AC
  Filled 2015-05-02: qty 5

## 2015-05-02 MED ORDER — SODIUM CHLORIDE 0.9 % IV SOLN
600.0000 mg/m2 | Freq: Once | INTRAVENOUS | Status: AC
  Administered 2015-05-02: 1240 mg via INTRAVENOUS
  Filled 2015-05-02: qty 62

## 2015-05-02 MED ORDER — PEGFILGRASTIM 6 MG/0.6ML ~~LOC~~ PSKT
6.0000 mg | PREFILLED_SYRINGE | Freq: Once | SUBCUTANEOUS | Status: AC
  Administered 2015-05-02: 6 mg via SUBCUTANEOUS
  Filled 2015-05-02: qty 0.6

## 2015-05-02 NOTE — Progress Notes (Unsigned)
Met with pt during 1st chemo treatment. Denies needs and without complaints. Encourage pt to call with questions or concerns.

## 2015-05-02 NOTE — Patient Instructions (Addendum)
[facility] Discharge Instructions for Patients Receiving Chemotherapy  Today you received the following chemotherapy agents Adrucil and Cytoxan  To help prevent nausea and vomiting after your treatment, we encourage you to take your nausea medication {Zofran as prescribed.   If you develop nausea and vomiting that is not controlled by your nausea medication, call the clinic.   BELOW ARE SYMPTOMS THAT SHOULD BE REPORTED IMMEDIATELY:  *FEVER GREATER THAN 100.5 F  *CHILLS WITH OR WITHOUT FEVER  NAUSEA AND VOMITING THAT IS NOT CONTROLLED WITH YOUR NAUSEA MEDICATION  *UNUSUAL SHORTNESS OF BREATH  *UNUSUAL BRUISING OR BLEEDING  TENDERNESS IN MOUTH AND THROAT WITH OR WITHOUT PRESENCE OF ULCERS  *URINARY PROBLEMS  *BOWEL PROBLEMS  UNUSUAL RASH Items with * indicate a potential emergency and should be followed up as soon as possible.  Feel free to call the clinic you have any questions or concerns. The clinic phone number is [phone].  Please show the [place] at check-in to the Emergency Department and triage nurse.   Cyclophosphamide injection What is this medicine? CYCLOPHOSPHAMIDE (sye kloe FOSS fa mide) is a chemotherapy drug. It slows the growth of cancer cells. This medicine is used to treat many types of cancer like lymphoma, myeloma, leukemia, breast cancer, and ovarian cancer, to name a few. This medicine may be used for other purposes; ask your health care provider or pharmacist if you have questions. COMMON BRAND NAME(S): Cytoxan, Neosar What should I tell my health care provider before I take this medicine? They need to know if you have any of these conditions: -blood disorders -history of other chemotherapy -infection -kidney disease -liver disease -recent or ongoing radiation therapy -tumors in the bone marrow -an unusual or allergic reaction to cyclophosphamide, other chemotherapy, other medicines, foods, dyes, or  preservatives -pregnant or trying to get pregnant -breast-feeding How should I use this medicine? This drug is usually given as an injection into a vein or muscle or by infusion into a vein. It is administered in a hospital or clinic by a specially trained health care professional. Talk to your pediatrician regarding the use of this medicine in children. Special care may be needed. Overdosage: If you think you have taken too much of this medicine contact a poison control center or emergency room at once. NOTE: This medicine is only for you. Do not share this medicine with others. What if I miss a dose? It is important not to miss your dose. Call your doctor or health care professional if you are unable to keep an appointment. What may interact with this medicine? This medicine may interact with the following medications: -amiodarone -amphotericin B -azathioprine -certain antiviral medicines for HIV or AIDS such as protease inhibitors (e.g., indinavir, ritonavir) and zidovudine -certain blood pressure medications such as benazepril, captopril, enalapril, fosinopril, lisinopril, moexipril, monopril, perindopril, quinapril, ramipril, trandolapril -certain cancer medications such as anthracyclines (e.g., daunorubicin, doxorubicin), busulfan, cytarabine, paclitaxel, pentostatin, tamoxifen, trastuzumab -certain diuretics such as chlorothiazide, chlorthalidone, hydrochlorothiazide, indapamide, metolazone -certain medicines that treat or prevent blood clots like warfarin -certain muscle relaxants such as succinylcholine -cyclosporine -etanercept -indomethacin -medicines to increase blood counts like filgrastim, pegfilgrastim, sargramostim -medicines used as general anesthesia -metronidazole -natalizumab This list may not describe all possible interactions. Give your health care provider a list of all the medicines, herbs, non-prescription drugs, or dietary supplements you use. Also tell them if  you smoke, drink alcohol, or use illegal drugs. Some items may interact with your medicine. What should I  watch for while using this medicine? Visit your doctor for checks on your progress. This drug may make you feel generally unwell. This is not uncommon, as chemotherapy can affect healthy cells as well as cancer cells. Report any side effects. Continue your course of treatment even though you feel ill unless your doctor tells you to stop. Drink water or other fluids as directed. Urinate often, even at night. In some cases, you may be given additional medicines to help with side effects. Follow all directions for their use. Call your doctor or health care professional for advice if you get a fever, chills or sore throat, or other symptoms of a cold or flu. Do not treat yourself. This drug decreases your body's ability to fight infections. Try to avoid being around people who are sick. This medicine may increase your risk to bruise or bleed. Call your doctor or health care professional if you notice any unusual bleeding. Be careful brushing and flossing your teeth or using a toothpick because you may get an infection or bleed more easily. If you have any dental work done, tell your dentist you are receiving this medicine. You may get drowsy or dizzy. Do not drive, use machinery, or do anything that needs mental alertness until you know how this medicine affects you. Do not become pregnant while taking this medicine or for 1 year after stopping it. Women should inform their doctor if they wish to become pregnant or think they might be pregnant. Men should not father a child while taking this medicine and for 4 months after stopping it. There is a potential for serious side effects to an unborn child. Talk to your health care professional or pharmacist for more information. Do not breast-feed an infant while taking this medicine. This medicine may interfere with the ability to have a child. This medicine  has caused ovarian failure in some women. This medicine has caused reduced sperm counts in some men. You should talk with your doctor or health care professional if you are concerned about your fertility. If you are going to have surgery, tell your doctor or health care professional that you have taken this medicine. What side effects may I notice from receiving this medicine? Side effects that you should report to your doctor or health care professional as soon as possible: -allergic reactions like skin rash, itching or hives, swelling of the face, lips, or tongue -low blood counts - this medicine may decrease the number of white blood cells, red blood cells and platelets. You may be at increased risk for infections and bleeding. -signs of infection - fever or chills, cough, sore throat, pain or difficulty passing urine -signs of decreased platelets or bleeding - bruising, pinpoint red spots on the skin, black, tarry stools, blood in the urine -signs of decreased red blood cells - unusually weak or tired, fainting spells, lightheadedness -breathing problems -dark urine -dizziness -palpitations -swelling of the ankles, feet, hands -trouble passing urine or change in the amount of urine -weight gain -yellowing of the eyes or skin Side effects that usually do not require medical attention (report to your doctor or health care professional if they continue or are bothersome): -changes in nail or skin color -hair loss -missed menstrual periods -mouth sores -nausea, vomiting This list may not describe all possible side effects. Call your doctor for medical advice about side effects. You may report side effects to FDA at 1-800-FDA-1088. Where should I keep my medicine? This drug is given in a  hospital or clinic and will not be stored at home. NOTE: This sheet is a summary. It may not cover all possible information. If you have questions about this medicine, talk to your doctor, pharmacist, or  health care provider.  2015, [employer]. (2012-09-23 16:22:58)   Doxorubicin injection What is this medicine? DOXORUBICIN (dox oh ROO bi sin) is a chemotherapy drug. It is used to treat many kinds of cancer like Hodgkin's disease, leukemia, non-Hodgkin's lymphoma, neuroblastoma, sarcoma, and Wilms' tumor. It is also used to treat bladder cancer, breast cancer, lung cancer, ovarian cancer, stomach cancer, and thyroid cancer. This medicine may be used for other purposes; ask your health care provider or pharmacist if you have questions. COMMON BRAND NAME(S): Adriamycin, Adriamycin PFS, Adriamycin RDF, Rubex What should I tell my health care provider before I take this medicine? They need to know if you have any of these conditions: -blood disorders -heart disease, recent heart attack -infection (especially a virus infection such as chickenpox, cold sores, or herpes) -irregular heartbeat -liver disease -recent or ongoing radiation therapy -an unusual or allergic reaction to doxorubicin, other chemotherapy agents, other medicines, foods, dyes, or preservatives -pregnant or trying to get pregnant -breast-feeding How should I use this medicine? This drug is given as an infusion into a vein. It is administered in a hospital or clinic by a specially trained health care professional. If you have pain, swelling, burning or any unusual feeling around the site of your injection, tell your health care professional right away. Talk to your pediatrician regarding the use of this medicine in children. Special care may be needed. Overdosage: If you think you have taken too much of this medicine contact a poison control center or emergency room at once. NOTE: This medicine is only for you. Do not share this medicine with others. What if I miss a dose? It is important not to miss your dose. Call your doctor or health care professional if you are unable to keep an appointment. What may interact  with this medicine? Do not take this medicine with any of the following medications: -cisapride -droperidol -halofantrine -pimozide -zidovudine This medicine may also interact with the following medications: -chloroquine -chlorpromazine -clarithromycin -cyclophosphamide -cyclosporine -erythromycin -medicines for depression, anxiety, or psychotic disturbances -medicines for irregular heart beat like amiodarone, bepridil, dofetilide, encainide, flecainide, propafenone, quinidine -medicines for seizures like ethotoin, fosphenytoin, phenytoin -medicines for nausea, vomiting like dolasetron, ondansetron, palonosetron -medicines to increase blood counts like filgrastim, pegfilgrastim, sargramostim -methadone -methotrexate -pentamidine -progesterone -vaccines -verapamil Talk to your doctor or health care professional before taking any of these medicines: -acetaminophen -aspirin -ibuprofen -ketoprofen -naproxen This list may not describe all possible interactions. Give your health care provider a list of all the medicines, herbs, non-prescription drugs, or dietary supplements you use. Also tell them if you smoke, drink alcohol, or use illegal drugs. Some items may interact with your medicine. What should I watch for while using this medicine? Your condition will be monitored carefully while you are receiving this medicine. You will need important blood work done while you are taking this medicine. This drug may make you feel generally unwell. This is not uncommon, as chemotherapy can affect healthy cells as well as cancer cells. Report any side effects. Continue your course of treatment even though you feel ill unless your doctor tells you to stop. Your urine may turn red for a few days after your dose. This is not blood. If your urine is dark or brown, call your doctor. In  some cases, you may be given additional medicines to help with side effects. Follow all directions for their  use. Call your doctor or health care professional for advice if you get a fever, chills or sore throat, or other symptoms of a cold or flu. Do not treat yourself. This drug decreases your body's ability to fight infections. Try to avoid being around people who are sick. This medicine may increase your risk to bruise or bleed. Call your doctor or health care professional if you notice any unusual bleeding. Be careful brushing and flossing your teeth or using a toothpick because you may get an infection or bleed more easily. If you have any dental work done, tell your dentist you are receiving this medicine. Avoid taking products that contain aspirin, acetaminophen, ibuprofen, naproxen, or ketoprofen unless instructed by your doctor. These medicines may hide a fever. Men and women of childbearing age should use effective birth control methods while using taking this medicine. Do not become pregnant while taking this medicine. There is a potential for serious side effects to an unborn child. Talk to your health care professional or pharmacist for more information. Do not breast-feed an infant while taking this medicine. Do not let others touch your urine or other body fluids for 5 days after each treatment with this medicine. Caregivers should wear latex gloves to avoid touching body fluids during this time. There is a maximum amount of this medicine you should receive throughout your life. The amount depends on the medical condition being treated and your overall health. Your doctor will watch how much of this medicine you receive in your lifetime. Tell your doctor if you have taken this medicine before. What side effects may I notice from receiving this medicine? Side effects that you should report to your doctor or health care professional as soon as possible: -allergic reactions like skin rash, itching or hives, swelling of the face, lips, or tongue -low blood counts - this medicine may decrease the  number of white blood cells, red blood cells and platelets. You may be at increased risk for infections and bleeding. -signs of infection - fever or chills, cough, sore throat, pain or difficulty passing urine -signs of decreased platelets or bleeding - bruising, pinpoint red spots on the skin, black, tarry stools, blood in the urine -signs of decreased red blood cells - unusually weak or tired, fainting spells, lightheadedness -breathing problems -chest pain -fast, irregular heartbeat -mouth sores -nausea, vomiting -pain, swelling, redness at site where injected -pain, tingling, numbness in the hands or feet -swelling of ankles, feet, or hands -unusual bleeding or bruising Side effects that usually do not require medical attention (report to your doctor or health care professional if they continue or are bothersome): -diarrhea -facial flushing -hair loss -loss of appetite -missed menstrual periods -nail discoloration or damage -red or watery eyes -red colored urine -stomach upset This list may not describe all possible side effects. Call your doctor for medical advice about side effects. You may report side effects to FDA at 1-800-FDA-1088. Where should I keep my medicine? This drug is given in a hospital or clinic and will not be stored at home. NOTE: This sheet is a summary. It may not cover all possible information. If you have questions about this medicine, talk to your doctor, pharmacist, or health care provider.  2015, [employer]. (2013-03-07 09:54:34)

## 2015-05-03 ENCOUNTER — Encounter: Payer: Self-pay | Admitting: Nurse Practitioner

## 2015-05-08 ENCOUNTER — Other Ambulatory Visit: Payer: Self-pay | Admitting: *Deleted

## 2015-05-08 DIAGNOSIS — C50412 Malignant neoplasm of upper-outer quadrant of left female breast: Secondary | ICD-10-CM

## 2015-05-09 ENCOUNTER — Telehealth: Payer: Self-pay | Admitting: Oncology

## 2015-05-09 ENCOUNTER — Other Ambulatory Visit (HOSPITAL_BASED_OUTPATIENT_CLINIC_OR_DEPARTMENT_OTHER): Payer: Federal, State, Local not specified - PPO

## 2015-05-09 ENCOUNTER — Ambulatory Visit (HOSPITAL_BASED_OUTPATIENT_CLINIC_OR_DEPARTMENT_OTHER): Payer: Federal, State, Local not specified - PPO | Admitting: Oncology

## 2015-05-09 ENCOUNTER — Ambulatory Visit: Payer: Federal, State, Local not specified - PPO

## 2015-05-09 VITALS — BP 128/83 | HR 95 | Temp 98.1°F | Resp 18 | Ht 62.0 in | Wt 211.4 lb

## 2015-05-09 DIAGNOSIS — Z95828 Presence of other vascular implants and grafts: Secondary | ICD-10-CM

## 2015-05-09 DIAGNOSIS — C50412 Malignant neoplasm of upper-outer quadrant of left female breast: Secondary | ICD-10-CM | POA: Diagnosis not present

## 2015-05-09 LAB — CBC WITH DIFFERENTIAL/PLATELET
BASO%: 0.4 % (ref 0.0–2.0)
Basophils Absolute: 0 10*3/uL (ref 0.0–0.1)
EOS%: 3.5 % (ref 0.0–7.0)
Eosinophils Absolute: 0.1 10*3/uL (ref 0.0–0.5)
HCT: 34.5 % — ABNORMAL LOW (ref 34.8–46.6)
HGB: 11.5 g/dL — ABNORMAL LOW (ref 11.6–15.9)
LYMPH%: 62.5 % — AB (ref 14.0–49.7)
MCH: 29.8 pg (ref 25.1–34.0)
MCHC: 33.3 g/dL (ref 31.5–36.0)
MCV: 89.4 fL (ref 79.5–101.0)
MONO#: 0.2 10*3/uL (ref 0.1–0.9)
MONO%: 7.3 % (ref 0.0–14.0)
NEUT#: 0.7 10*3/uL — ABNORMAL LOW (ref 1.5–6.5)
NEUT%: 26.3 % — AB (ref 38.4–76.8)
Platelets: 151 10*3/uL (ref 145–400)
RBC: 3.86 10*6/uL (ref 3.70–5.45)
RDW: 13.3 % (ref 11.2–14.5)
WBC: 2.6 10*3/uL — ABNORMAL LOW (ref 3.9–10.3)
lymph#: 1.6 10*3/uL (ref 0.9–3.3)

## 2015-05-09 MED ORDER — HEPARIN SOD (PORK) LOCK FLUSH 100 UNIT/ML IV SOLN
500.0000 [IU] | Freq: Once | INTRAVENOUS | Status: DC
  Filled 2015-05-09: qty 5

## 2015-05-09 MED ORDER — SODIUM CHLORIDE 0.9 % IJ SOLN
10.0000 mL | INTRAMUSCULAR | Status: DC | PRN
  Filled 2015-05-09: qty 10

## 2015-05-09 NOTE — Patient Instructions (Signed)

## 2015-05-09 NOTE — Telephone Encounter (Signed)
Gave avs & calendar for June/July.  °

## 2015-05-09 NOTE — Progress Notes (Signed)
Cheryl [name]  [phone] [phone]) [phone]     ID: Cheryl [name] DOB: 06/15/1976  MR#: [id number]  OFB#:[id number]  Patient Care Team: No Pcp Per Patient as PCP - General (General Practice) [name], MD as Consulting Physician (Obstetrics and Gynecology) [name], MD as Consulting Physician (General Surgery) [name], MD as Consulting Physician (Oncology) [name], MD as Consulting Physician (Radiation Oncology) [name], RN as Registered Nurse [name], RN as Registered Nurse [name], NP as Nurse Practitioner (Nurse Practitioner) PCP: No PCP Per Patient OTHER MD:  CHIEF COMPLAINT: Left breast cancer  CURRENT TREATMENT: Adjuvant chemotherapy   BREAST CANCER HISTORY: From the original intake note:  The patient had screening mammography 02/01/2015 which showed a calcifications in the upper outer quadrant of the left breast. On 02/08/2015 she underwent left diagnostic mammography with tomosynthesis and left breast ultrasonography at the breast Center. The breast density was category C. Mammography confirmed a group of pleomorphic calcifications spanning 4.2 cm maximally. Physical examination of the upper outer quadrant of the left breast showed an area of thickening at the approximate 2:00 position. Ultrasound of this area showed no discrete masses. There was vague shadowing present. Calcifications could not be clearly identified sonographically. There was no lymphadenopathy in the left axilla.  On the same day the patient underwent biopsy of the left breast area in question, with the pathology (S [id number]) showing ductal carcinoma in situ, grade 2 or 3, with possible areas of microinvasion, the cancer cells being strongly estrogen and progesterone receptor positive, both at 100%.  On 02/15/2015 the patient underwent bilateral breast MRI. This showed the area of malignancy in the upper outer quadrant  to measure 4.5 cm, including a large area of non-masslike enhancement and a 1 cm spiculated mass. There was also an indeterminate oval mass in the central left breast and another indeterminant mass in the slightly outer right breast anteriorly. Biopsy of the right breast mass 02/18/2015 ([id number]) showed a fibroadenoma, with no evidence of malignancy. Biopsy of 2 additional areas of the left breast (at 10:00 and 2:00) showed a fibroadenoma in the upper inner quadrant, and pseudo-angiomatous stromal hyperplasia (PA SH) at the 2:00 area of the left breast.  The patient's subsequent history is as detailed below.  INTERVAL HISTORY: Cheryl [name] returns today for follow-up of her triple negative invasive breast cancer. Today is day 8 cycle 1 of 4 planned cycles of cyclophosphamide and doxorubicin given every 14 days, with Neulasta support, to be followed by paclitaxel weekly 12.  REVIEW OF SYSTEMS: She did generally quite well with the first cycle of treatment. The port worked well. She had essentially no symptoms on the first 2 days, except perhaps a little bit of fatigue in the evening of the 2 (after the Neulasta). Day 3 was the worst day. She spent may be half the day landing around her she did drink lots of fluid. She had a queasy stomach but she never vomited. She had some mild headaches, likely due to the palonosetron, and easily control those with Advil. She was minimally constipated and took Correctol to fix. She slept fine, without needing Ativan. She did not have any mouth sores or thrush. She still has a little bit of queasiness in her stomach and she tells me that this thinking about coming next week makes her a little bit queasy. Aside from these issues a detailed review of systems today was entirely benign  PAST MEDICAL HISTORY:  Past Medical History  Diagnosis Date  . Wears contact lenses   . Breast cancer     left breast    PAST SURGICAL HISTORY: Past Surgical History  Procedure  Laterality Date  . Breast lumpectomy with needle localization and axillary sentinel lymph node bx Left 03/27/2015    Procedure: LEFT BREAST LUMPECTOMY WITH BRACKETED NEEDLE LOCALIZATION AND LEFT  AXILLARY SENTINEL LYMPH NODE BX;  Surgeon: [name], MD;  Location: [facility];  Service: General;  Laterality: Left;  . Breast surgery    . Re-excision of breast lumpectomy Left 04/05/2015    Procedure: RE-EXCISION LEFT  BREAST LUMPECTOMY;  Surgeon: [name], MD;  Location: [facility];  Service: General;  Laterality: Left;  . Portacath placement Right 04/25/2015    Procedure: INSERTION PORT-A-CATH;  Surgeon: [name], MD;  Location: [facility];  Service: General;  Laterality: Right;    FAMILY HISTORY Family History  Problem Relation Age of Onset  . Lung cancer Maternal Grandmother 87    non smoker  . Lung cancer Maternal Grandfather 64    non smoker worked in [employer]  . Cancer Paternal Grandfather 35    throat cancer ? smoker  . Cancer Cousin 41    throat cancer ? smoker   the patient's parents are living, in their mid-50s as of March 2016. The patient had no siblings. Both the patient's mother's parents died from lung cancer although they did not smoke. [place] and her maternal grandfather was a Ecologist. There is no history of breast or ovarian cancer in the family to her knowledge  GYNECOLOGIC HISTORY:  No LMP recorded. Menarche age 12, first live birth age 76. The patient is GX P3. [name] having regular periods. She uses the Essure device for contraception.  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 Her husband [name] is a Freight forwarder. Their daughter Cheryl [name] lives in [place] where she works in Press photographer. Daughter Cheryl [name] is a Research scientist (physical sciences) and lives with the patient.. Daughter Cheryl [name] also at home is 63 years old.    ADVANCED DIRECTIVES: Not in place   HEALTH  MAINTENANCE: History  Substance Use Topics  . Smoking status: Never Smoker   . Smokeless tobacco: Never Used  . Alcohol Use: Yes     Colonoscopy:  PAP:  Bone density:  Lipid panel:  Allergies  Allergen Reactions  . Adhesive [Tape] Other (See Comments)    Skin blisters    Current Outpatient Prescriptions  Medication Sig Dispense Refill  . dexamethasone (DECADRON) 4 MG tablet Take 2 tablets by mouth once a day on the day after chemotherapy and then take 2 tablets two times a day for 2 days. Take with food. 30 tablet 1  . ibuprofen (ADVIL,MOTRIN) 400 MG tablet Take 400 mg by mouth every 6 (six) hours as needed.    . lidocaine-prilocaine (EMLA) cream Apply to affected area once 30 g 3  . LORazepam (ATIVAN) 0.5 MG tablet Take 1 tablet (0.5 mg total) by mouth at bedtime. 30 tablet 0  . ondansetron (ZOFRAN) 8 MG tablet Take 1 tablet (8 mg total) by mouth 2 (two) times daily as needed. Start on the third day after chemotherapy. 30 tablet 1  . prochlorperazine (COMPAZINE) 10 MG tablet Take 1 tablet (10 mg total) by mouth every 6 (six) hours as needed (Nausea or vomiting). 30 tablet 1   No current facility-administered medications  for this visit.    OBJECTIVE: There-appearing white woman in no acute distress Filed Vitals:   05/09/15 0811  BP: 128/83  Pulse: 95  Temp: 98.1 F (36.7 C)  Resp: 18     Body mass index is 38.66 kg/(m^2).    ECOG FS:0 - Asymptomatic  Sclerae unicteric, pupils round and equal Oropharynx clear and moist-- no thrush or other lesions No cervical or supraclavicular adenopathy Lungs no rales or rhonchi Heart regular rate and rhythm Abd soft, nontender, positive bowel sounds MSK no focal spinal tenderness, no upper extremity lymphedema Neuro: nonfocal, well oriented, appropriate affect Breasts: Deferred    LAB RESULTS:  CMP     Component Value Date/Time   NA 141 05/01/2015 1348   K 4.1 05/01/2015 1348   CO2 26 05/01/2015 1348   GLUCOSE 154*  05/01/2015 1348   BUN 9.9 05/01/2015 1348   CREATININE 0.8 05/01/2015 1348   CALCIUM 9.0 05/01/2015 1348   PROT 6.5 05/01/2015 1348   ALBUMIN 3.2* 05/01/2015 1348   AST 12 05/01/2015 1348   ALT 9 05/01/2015 1348   ALKPHOS 51 05/01/2015 1348   BILITOT 0.44 05/01/2015 1348    INo results found for: SPEP, UPEP  Lab Results  Component Value Date   WBC 11.4* 05/01/2015   NEUTROABS 7.2* 05/01/2015   HGB 11.8 05/01/2015   HCT 35.0 05/01/2015   MCV 88.6 05/01/2015   PLT 291 05/01/2015      Chemistry      Component Value Date/Time   NA 141 05/01/2015 1348   K 4.1 05/01/2015 1348   CO2 26 05/01/2015 1348   BUN 9.9 05/01/2015 1348   CREATININE 0.8 05/01/2015 1348      Component Value Date/Time   CALCIUM 9.0 05/01/2015 1348   ALKPHOS 51 05/01/2015 1348   AST 12 05/01/2015 1348   ALT 9 05/01/2015 1348   BILITOT 0.44 05/01/2015 1348       No results found for: LABCA2  No components found for: LABCA125  No results for input(s): INR in the last 168 hours.  Urinalysis No results found for: COLORURINE, APPEARANCEUR, LABSPEC, PHURINE, GLUCOSEU, HGBUR, BILIRUBINUR, KETONESUR, PROTEINUR, UROBILINOGEN, NITRITE, LEUKOCYTESUR  STUDIES: Dg Chest Port 1 View  04/25/2015   CLINICAL DATA:  Breast carcinoma and status post Port-A-Cath placement.  EXAM: PORTABLE CHEST - 1 VIEW  COMPARISON:  None.  FINDINGS: Right jugular Port-A-Cath has been placed with the catheter tip at the level of the SVC. No pneumothorax present. Lungs show mild bibasilar atelectasis. There is no evidence of pulmonary edema, consolidation, nodule or pleural fluid. The heart size is normal.  IMPRESSION: Tip of Port-A-Cath in the SVC.  No pneumothorax.   Electronically Signed   By: Aletta Edouard M.D.   On: 04/25/2015 08:56   Dg Fluoro Guide Cv Line-no Report  04/25/2015   CLINICAL DATA:    FLOURO GUIDE CV LINE  Fluoroscopy was utilized by the requesting physician.  No radiographic  interpretation.     ASSESSMENT: 39  y.o. [place], [place] woman status post left breast biopsy 02/08/2015 for ductal carcinoma in situ, grade 2 or 3, strongly estrogen and progesterone receptor positive, with likely areas of microinvasion  (a) biopsy of an area in the left breast upper outer quadrant showed PASH  (b) biopsy of 2 additional questionable areas, one in each breast, showed bilateral fibroadenomas  (1) genetics testing March 2016 through the [facility] gene panel offered by [facility] showed no mutations in the following 17 genes: ATM,  BARD1, BRCA1, BRCA2, BRIP1, CDH1, CHEK2, MRE11A, MUTYH, NBN, NF1, PALB2, PTEN, RAD50, RAD51C, RAD51D, and TP53.  (2) status post left lumpectomy with sentinel lymph node sampling 03/27/2015 for a pT1c pN0, stage IA invasive ductal carcinoma, grade 3, triple negative, with an MIB-1 of 33%  (a) close margins were cleared with subsequent excision 04/05/2015.  (3) Oncotype DX score of 38 predicts a risk of 26% outside the breast recurrence within 10 years if the patient's only systemic treatment is tamoxifen for 5 years  (4) adjuvant chemotherapy started 05/02/2015 consisting of doxorubicin and cyclophosphamide in dose dense fashion 4, with onpro support, followed by paclitaxel weekly 12.  (5) adjuvant radiation to follow chemotherapy  (5) tamoxifen started 02/20/2015 (neoadjuvantly), discontinued 04/19/2015 so as not to overlap chemotherapy; be resumed at the completion of radiation  PLAN: Dennice did generally quite well with the first cycle of doxorubicin and cyclophosphamide and I am making no major changes in her treatment. The one suggestion I did make was for her to take lorazepam on the way to the clinic on treatment today. She understands she will not be able to drive herself here or home if she does this. The reason for this is that she is just beginning to develop associated nausea and we really want to interrupt that.  We discussed accommodation which is the reason for her slight  blurring in vision. This will take several months to clear after she completes chemotherapy. I also warned her regarding problems with mouth sores or thrush and she will call us of those develop luckily they have not so far.  She understands she will likely lose her hair sometime within the next 7-10 days. She already has a week on hand.  Otherwise she is ready scheduled for lab visit and treatment in a week. I have added a visit for June 30, which had not been scheduled, and a visit with me late July after she finishes this portion of the treatment so that we can discuss the Taxol treatments to follow.  [name], MD   05/09/2015 8:29 AM

## 2015-05-09 NOTE — Progress Notes (Signed)
Patient in for labs and PAC flush. Patient states, "I just had my port flushed last week in the Infusion Room. I don't think that it needs to be flushed today. They can draw my labs from my arm or hand." Patient was seen in the Infusion Room on 05/02/2015. All labs drawn by [place], Phlebotomist today.

## 2015-05-14 ENCOUNTER — Other Ambulatory Visit: Payer: Self-pay

## 2015-05-14 ENCOUNTER — Ambulatory Visit (HOSPITAL_BASED_OUTPATIENT_CLINIC_OR_DEPARTMENT_OTHER): Payer: Federal, State, Local not specified - PPO | Admitting: Nurse Practitioner

## 2015-05-14 ENCOUNTER — Encounter: Payer: Self-pay | Admitting: Oncology

## 2015-05-14 ENCOUNTER — Other Ambulatory Visit (HOSPITAL_BASED_OUTPATIENT_CLINIC_OR_DEPARTMENT_OTHER): Payer: Federal, State, Local not specified - PPO

## 2015-05-14 VITALS — BP 130/72 | HR 117 | Temp 98.7°F | Resp 20 | Wt 215.6 lb

## 2015-05-14 DIAGNOSIS — R300 Dysuria: Secondary | ICD-10-CM | POA: Diagnosis not present

## 2015-05-14 DIAGNOSIS — C50412 Malignant neoplasm of upper-outer quadrant of left female breast: Secondary | ICD-10-CM

## 2015-05-14 DIAGNOSIS — R350 Frequency of micturition: Secondary | ICD-10-CM | POA: Diagnosis not present

## 2015-05-14 DIAGNOSIS — Z79899 Other long term (current) drug therapy: Secondary | ICD-10-CM | POA: Diagnosis not present

## 2015-05-14 DIAGNOSIS — R109 Unspecified abdominal pain: Secondary | ICD-10-CM

## 2015-05-14 DIAGNOSIS — N390 Urinary tract infection, site not specified: Secondary | ICD-10-CM

## 2015-05-14 DIAGNOSIS — R319 Hematuria, unspecified: Principal | ICD-10-CM

## 2015-05-14 LAB — URINALYSIS, MICROSCOPIC - CHCC
BILIRUBIN (URINE): NEGATIVE
GLUCOSE UR CHCC: NEGATIVE mg/dL
Ketones: NEGATIVE mg/dL
Leukocyte Esterase: NEGATIVE
NITRITE: NEGATIVE
PH: 5 (ref 4.6–8.0)
Protein: NEGATIVE mg/dL
SPECIFIC GRAVITY, URINE: 1.015 (ref 1.003–1.035)
Urobilinogen, UR: 0.2 mg/dL (ref 0.2–1)

## 2015-05-14 MED ORDER — CIPROFLOXACIN HCL 500 MG PO TABS: 500.0000 mg | ORAL_TABLET | Freq: Two times a day (BID) | ORAL | Status: DC

## 2015-05-14 NOTE — Progress Notes (Signed)
80 - LMOVM - pt to return call to clinic.

## 2015-05-14 NOTE — Telephone Encounter (Signed)
Pt reports pain in back 0 started yesterday.  5/10 pain, aching, constant, worse with movement.  No urinary symptoms other than frequency which is not new.  Pain upper right back.  Per Dr. Jana Hakim, bring pt in to see Jenny Reichmann, lab first.

## 2015-05-15 ENCOUNTER — Other Ambulatory Visit: Payer: Self-pay | Admitting: *Deleted

## 2015-05-15 ENCOUNTER — Encounter: Payer: Self-pay | Admitting: Oncology

## 2015-05-15 ENCOUNTER — Telehealth: Payer: Self-pay

## 2015-05-15 DIAGNOSIS — C50412 Malignant neoplasm of upper-outer quadrant of left female breast: Secondary | ICD-10-CM

## 2015-05-15 NOTE — Telephone Encounter (Signed)
Called to follow up with pt after [facility] visit yesterday 6/22.

## 2015-05-16 ENCOUNTER — Ambulatory Visit (HOSPITAL_BASED_OUTPATIENT_CLINIC_OR_DEPARTMENT_OTHER): Payer: Federal, State, Local not specified - PPO | Admitting: Nurse Practitioner

## 2015-05-16 ENCOUNTER — Other Ambulatory Visit (HOSPITAL_BASED_OUTPATIENT_CLINIC_OR_DEPARTMENT_OTHER): Payer: Federal, State, Local not specified - PPO

## 2015-05-16 ENCOUNTER — Other Ambulatory Visit: Payer: Self-pay | Admitting: Oncology

## 2015-05-16 ENCOUNTER — Ambulatory Visit (HOSPITAL_BASED_OUTPATIENT_CLINIC_OR_DEPARTMENT_OTHER): Payer: Federal, State, Local not specified - PPO

## 2015-05-16 ENCOUNTER — Ambulatory Visit: Payer: Federal, State, Local not specified - PPO

## 2015-05-16 ENCOUNTER — Encounter: Payer: Self-pay | Admitting: Nurse Practitioner

## 2015-05-16 ENCOUNTER — Other Ambulatory Visit: Payer: Federal, State, Local not specified - PPO

## 2015-05-16 VITALS — BP 125/85 | HR 103 | Temp 98.2°F | Resp 18 | Ht 62.0 in | Wt 213.2 lb

## 2015-05-16 DIAGNOSIS — R609 Edema, unspecified: Secondary | ICD-10-CM

## 2015-05-16 DIAGNOSIS — Z5189 Encounter for other specified aftercare: Secondary | ICD-10-CM

## 2015-05-16 DIAGNOSIS — Z5111 Encounter for antineoplastic chemotherapy: Secondary | ICD-10-CM | POA: Diagnosis not present

## 2015-05-16 DIAGNOSIS — C50412 Malignant neoplasm of upper-outer quadrant of left female breast: Secondary | ICD-10-CM

## 2015-05-16 DIAGNOSIS — N390 Urinary tract infection, site not specified: Secondary | ICD-10-CM | POA: Insufficient documentation

## 2015-05-16 LAB — CBC WITH DIFFERENTIAL/PLATELET
BASO%: 0.7 % (ref 0.0–2.0)
Basophils Absolute: 0.1 10*3/uL (ref 0.0–0.1)
EOS ABS: 0 10*3/uL (ref 0.0–0.5)
EOS%: 0.2 % (ref 0.0–7.0)
HCT: 35.3 % (ref 34.8–46.6)
HGB: 11.8 g/dL (ref 11.6–15.9)
LYMPH#: 2.4 10*3/uL (ref 0.9–3.3)
LYMPH%: 19.6 % (ref 14.0–49.7)
MCH: 30.2 pg (ref 25.1–34.0)
MCHC: 33.4 g/dL (ref 31.5–36.0)
MCV: 90.3 fL (ref 79.5–101.0)
MONO#: 0.8 10*3/uL (ref 0.1–0.9)
MONO%: 6.6 % (ref 0.0–14.0)
NEUT%: 72.9 % (ref 38.4–76.8)
NEUTROS ABS: 8.9 10*3/uL — AB (ref 1.5–6.5)
PLATELETS: 243 10*3/uL (ref 145–400)
RBC: 3.91 10*6/uL (ref 3.70–5.45)
RDW: 13.8 % (ref 11.2–14.5)
WBC: 12.2 10*3/uL — AB (ref 3.9–10.3)

## 2015-05-16 LAB — COMPREHENSIVE METABOLIC PANEL (CC13)
ALBUMIN: 3.4 g/dL — AB (ref 3.5–5.0)
ALK PHOS: 63 U/L (ref 40–150)
ALT: 15 U/L (ref 0–55)
AST: 14 U/L (ref 5–34)
Anion Gap: 8 mEq/L (ref 3–11)
BUN: 10.4 mg/dL (ref 7.0–26.0)
CO2: 26 mEq/L (ref 22–29)
Calcium: 8.9 mg/dL (ref 8.4–10.4)
Chloride: 106 mEq/L (ref 98–109)
Creatinine: 0.8 mg/dL (ref 0.6–1.1)
EGFR: 87 mL/min/{1.73_m2} — ABNORMAL LOW (ref >90)
GLUCOSE: 105 mg/dL (ref 70–140)
Potassium: 4.3 mEq/L (ref 3.5–5.1)
Sodium: 141 mEq/L (ref 136–145)
Total Bilirubin: 0.38 mg/dL (ref 0.20–1.20)
Total Protein: 6.6 g/dL (ref 6.4–8.3)

## 2015-05-16 MED ORDER — SODIUM CHLORIDE 0.9 % IV SOLN
Freq: Once | INTRAVENOUS | Status: AC
  Administered 2015-05-16: 15:00:00 via INTRAVENOUS

## 2015-05-16 MED ORDER — ACETAMINOPHEN 325 MG PO TABS
650.0000 mg | ORAL_TABLET | Freq: Once | ORAL | Status: AC
  Administered 2015-05-16: 650 mg via ORAL

## 2015-05-16 MED ORDER — PALONOSETRON HCL INJECTION 0.25 MG/5ML
0.2500 mg | Freq: Once | INTRAVENOUS | Status: AC
  Administered 2015-05-16: 0.25 mg via INTRAVENOUS

## 2015-05-16 MED ORDER — ACETAMINOPHEN 325 MG PO TABS
ORAL_TABLET | ORAL | Status: AC
  Filled 2015-05-16: qty 2

## 2015-05-16 MED ORDER — SODIUM CHLORIDE 0.9 % IV SOLN
Freq: Once | INTRAVENOUS | Status: AC
  Administered 2015-05-16: 15:00:00 via INTRAVENOUS
  Filled 2015-05-16: qty 5

## 2015-05-16 MED ORDER — DOXORUBICIN HCL CHEMO IV INJECTION 2 MG/ML
60.0000 mg/m2 | Freq: Once | INTRAVENOUS | Status: AC
  Administered 2015-05-16: 124 mg via INTRAVENOUS
  Filled 2015-05-16: qty 62

## 2015-05-16 MED ORDER — SODIUM CHLORIDE 0.9 % IJ SOLN
10.0000 mL | INTRAMUSCULAR | Status: DC | PRN
  Administered 2015-05-16: 10 mL
  Filled 2015-05-16: qty 10

## 2015-05-16 MED ORDER — PALONOSETRON HCL INJECTION 0.25 MG/5ML
INTRAVENOUS | Status: AC
  Filled 2015-05-16: qty 5

## 2015-05-16 MED ORDER — SODIUM CHLORIDE 0.9 % IV SOLN
600.0000 mg/m2 | Freq: Once | INTRAVENOUS | Status: AC
  Administered 2015-05-16: 1240 mg via INTRAVENOUS
  Filled 2015-05-16: qty 62

## 2015-05-16 MED ORDER — PEGFILGRASTIM 6 MG/0.6ML ~~LOC~~ PSKT
6.0000 mg | PREFILLED_SYRINGE | Freq: Once | SUBCUTANEOUS | Status: AC
  Administered 2015-05-16: 6 mg via SUBCUTANEOUS
  Filled 2015-05-16: qty 0.6

## 2015-05-16 MED ORDER — HEPARIN SOD (PORK) LOCK FLUSH 100 UNIT/ML IV SOLN
500.0000 [IU] | Freq: Once | INTRAVENOUS | Status: AC | PRN
  Administered 2015-05-16: 500 [IU]
  Filled 2015-05-16: qty 5

## 2015-05-16 NOTE — Progress Notes (Signed)
[place]  [phone] [phone]     ID: Cheryl [name] DOB: Aug 10, 1976  MR#: [id number]  BUL#:[id number]  Patient Care Team: No Pcp Per Patient as PCP - General (General Practice) [name], MD as Consulting Physician (Obstetrics and Gynecology) [name], MD as Consulting Physician (General Surgery) [name], MD as Consulting Physician (Oncology) [name], MD as Consulting Physician (Radiation Oncology) [name], RN as Registered Nurse [name], RN as Registered Nurse [name], NP as Nurse Practitioner (Nurse Practitioner) PCP: No PCP Per Patient OTHER MD:  CHIEF COMPLAINT: Left breast cancer  CURRENT TREATMENT: Adjuvant chemotherapy   BREAST CANCER HISTORY: From the original intake note:  The patient had screening mammography 02/01/2015 which showed a calcifications in the upper outer quadrant of the left breast. On 02/08/2015 she underwent left diagnostic mammography with tomosynthesis and left breast ultrasonography at the breast Center. The breast density was category C. Mammography confirmed a group of pleomorphic calcifications spanning 4.2 cm maximally. Physical examination of the upper outer quadrant of the left breast showed an area of thickening at the approximate 2:00 position. Ultrasound of this area showed no discrete masses. There was vague shadowing present. Calcifications could not be clearly identified sonographically. There was no lymphadenopathy in the left axilla.  On the same day the patient underwent biopsy of the left breast area in question, with the pathology (S [id number]) showing ductal carcinoma in situ, grade 2 or 3, with possible areas of microinvasion, the cancer cells being strongly estrogen and progesterone receptor positive, both at 100%.  On 02/15/2015 the patient underwent bilateral breast MRI. This showed the area of malignancy in the upper outer quadrant  to measure 4.5 cm, including a large area of non-masslike enhancement and a 1 cm spiculated mass. There was also an indeterminate oval mass in the central left breast and another indeterminant mass in the slightly outer right breast anteriorly. Biopsy of the right breast mass 02/18/2015 ([id number]) showed a fibroadenoma, with no evidence of malignancy. Biopsy of 2 additional areas of the left breast (at 10:00 and 2:00) showed a fibroadenoma in the upper inner quadrant, and pseudo-angiomatous stromal hyperplasia (PA SH) at the 2:00 area of the left breast.  The patient's subsequent history is as detailed below.  INTERVAL HISTORY: Cheryl [name] returns today for follow-up of her triple negative invasive breast cancer. Today is day 1 cycle 2 of 4 planned cycles of cyclophosphamide and doxorubicin given every 14 days, with Neulasta support, to be followed by paclitaxel weekly 12.  REVIEW OF SYSTEMS: Cheryl [name] denies fevers, chills, nausea, vomiting or changes in bowel habits. She visited the symptom management clinic 2 days ago with complaints of dysuria, frequency, and right flank pain. She was started on cipro BID and is just now seeing improvements today. Her appetite is healthy and she increased her water intake to "flush out" the infection. She has trace edema to her bilateral ankles at the end of a long day. She denies mouth sores or rashes. A detailed review of systems is otherwise stable.   PAST MEDICAL HISTORY: Past Medical History  Diagnosis Date  . Wears contact lenses   . Breast cancer     left breast    PAST SURGICAL HISTORY: Past Surgical History  Procedure Laterality Date  . Breast lumpectomy with needle localization and axillary sentinel lymph node bx Left 03/27/2015    Procedure: LEFT BREAST LUMPECTOMY WITH BRACKETED NEEDLE LOCALIZATION AND LEFT  AXILLARY SENTINEL  LYMPH NODE BX;  Surgeon: [name], MD;  Location: [place];  Service: General;  Laterality: Left;   . Breast surgery    . Re-excision of breast lumpectomy Left 04/05/2015    Procedure: RE-EXCISION LEFT  BREAST LUMPECTOMY;  Surgeon: [name], MD;  Location: [place];  Service: General;  Laterality: Left;  . Portacath placement Right 04/25/2015    Procedure: INSERTION PORT-A-CATH;  Surgeon: [name], MD;  Location: [place];  Service: General;  Laterality: Right;    FAMILY HISTORY Family History  Problem Relation Age of Onset  . Lung cancer Maternal Grandmother 34    non smoker  . Lung cancer Maternal Grandfather 39    non smoker worked in [employer]  . Cancer Paternal Grandfather 35    throat cancer ? smoker  . Cancer Cousin 41    throat cancer ? smoker   the patient's parents are living, in their mid-50s as of March 2016. The patient had no siblings. Both the patient's mother's parents died from lung cancer although they did not smoke. [place] and her maternal grandfather was a Ecologist. There is no history of breast or ovarian cancer in the family to her knowledge  GYNECOLOGIC HISTORY:  No LMP recorded. Menarche age 58, first live birth age 78. The patient is GX P3. [name] having regular periods. She uses the Essure device for contraception.  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 Her husband Cheryl [name] is a Freight forwarder. Their daughter Cheryl [name] lives in [place] where she works in Press photographer. Daughter Cheryl [name] is a Research scientist (physical sciences) and lives with the patient.. Daughter Cheryl [name] also at home is 16 years old.    ADVANCED DIRECTIVES: Not in place   HEALTH MAINTENANCE: History  Substance Use Topics  . Smoking status: Never Smoker   . Smokeless tobacco: Never Used  . Alcohol Use: Yes     Colonoscopy:  PAP:  Bone density:  Lipid panel:  Allergies  Allergen Reactions  . Adhesive [Tape] Other (See Comments)    Skin blisters    Current Outpatient Prescriptions  Medication Sig  Dispense Refill  . ciprofloxacin (CIPRO) 500 MG tablet Take 1 tablet (500 mg total) by mouth 2 (two) times daily. 10 tablet 0  . dexamethasone (DECADRON) 4 MG tablet Take 2 tablets by mouth once a day on the day after chemotherapy and then take 2 tablets two times a day for 2 days. Take with food. 30 tablet 1  . lidocaine-prilocaine (EMLA) cream Apply to affected area once 30 g 3  . ondansetron (ZOFRAN) 8 MG tablet Take 1 tablet (8 mg total) by mouth 2 (two) times daily as needed. Start on the third day after chemotherapy. 30 tablet 1  . ibuprofen (ADVIL,MOTRIN) 400 MG tablet Take 400 mg by mouth every 6 (six) hours as needed.    Marland Kitchen LORazepam (ATIVAN) 0.5 MG tablet Take 1 tablet (0.5 mg total) by mouth at bedtime. (Patient not taking: Reported on 05/16/2015) 30 tablet 0  . prochlorperazine (COMPAZINE) 10 MG tablet Take 1 tablet (10 mg total) by mouth every 6 (six) hours as needed (Nausea or vomiting). (Patient not taking: Reported on 05/16/2015) 30 tablet 1   No current facility-administered medications for this visit.    OBJECTIVE: There-appearing white woman in no acute distress Filed Vitals:   05/16/15 1323  BP: 125/85  Pulse: 103  Temp: 98.2 F (36.8 C)  Resp: 18     Body mass index is 38.98 kg/(m^2).    ECOG FS:0 - Asymptomatic  Skin: warm, dry  HEENT: sclerae anicteric, conjunctivae pink, oropharynx clear. No thrush or mucositis.  Lymph Nodes: No cervical or supraclavicular lymphadenopathy  Lungs: clear to auscultation bilaterally, no rales, wheezes, or rhonci  Heart: regular rate and rhythm  Abdomen: round, soft, non tender, positive bowel sounds  Musculoskeletal: No focal spinal tenderness, trace edema to bilateral lower extremities.  Neuro: non focal, well oriented, positive affect  Breasts: deferred  LAB RESULTS:  CMP     Component Value Date/Time   NA 141 05/01/2015 1348   K 4.1 05/01/2015 1348   CO2 26 05/01/2015 1348   GLUCOSE 154* 05/01/2015 1348   BUN 9.9  05/01/2015 1348   CREATININE 0.8 05/01/2015 1348   CALCIUM 9.0 05/01/2015 1348   PROT 6.5 05/01/2015 1348   ALBUMIN 3.2* 05/01/2015 1348   AST 12 05/01/2015 1348   ALT 9 05/01/2015 1348   ALKPHOS 51 05/01/2015 1348   BILITOT 0.44 05/01/2015 1348    INo results found for: SPEP, UPEP  Lab Results  Component Value Date   WBC 12.2* 05/16/2015   NEUTROABS 8.9* 05/16/2015   HGB 11.8 05/16/2015   HCT 35.3 05/16/2015   MCV 90.3 05/16/2015   PLT 243 05/16/2015      Chemistry      Component Value Date/Time   NA 141 05/01/2015 1348   K 4.1 05/01/2015 1348   CO2 26 05/01/2015 1348   BUN 9.9 05/01/2015 1348   CREATININE 0.8 05/01/2015 1348      Component Value Date/Time   CALCIUM 9.0 05/01/2015 1348   ALKPHOS 51 05/01/2015 1348   AST 12 05/01/2015 1348   ALT 9 05/01/2015 1348   BILITOT 0.44 05/01/2015 1348       No results found for: LABCA2  No components found for: LABCA125  No results for input(s): INR in the last 168 hours.  Urinalysis    Component Value Date/Time   LABSPEC 1.015 05/14/2015 1557   PHURINE 5.0 05/14/2015 1557   GLUCOSEU Negative 05/14/2015 1557   HGBUR Moderate 05/14/2015 1557   BILIRUBINUR Negative 05/14/2015 1557   KETONESUR Negative 05/14/2015 1557   PROTEINUR Negative 05/14/2015 1557   UROBILINOGEN 0.2 05/14/2015 1557   NITRITE Negative 05/14/2015 1557   LEUKOCYTESUR Negative 05/14/2015 1557    STUDIES: Dg Chest Port 1 View  04/25/2015   CLINICAL DATA:  Breast carcinoma and status post Port-A-Cath placement.  EXAM: PORTABLE CHEST - 1 VIEW  COMPARISON:  None.  FINDINGS: Right jugular Port-A-Cath has been placed with the catheter tip at the level of the SVC. No pneumothorax present. Lungs show mild bibasilar atelectasis. There is no evidence of pulmonary edema, consolidation, nodule or pleural fluid. The heart size is normal.  IMPRESSION: Tip of Port-A-Cath in the SVC.  No pneumothorax.   Electronically Signed   By: Aletta Edouard M.D.   On:  04/25/2015 08:56   Dg Fluoro Guide Cv Line-no Report  04/25/2015   CLINICAL DATA:    FLOURO GUIDE CV LINE  Fluoroscopy was utilized by the requesting physician.  No radiographic  interpretation.     ASSESSMENT: 39 y.o. [place], [place] woman status post left breast biopsy 02/08/2015 for ductal carcinoma in situ, grade 2 or 3, strongly estrogen and progesterone receptor positive, with likely areas of microinvasion  (a) biopsy of an area in the left breast upper outer quadrant showed PASH  (b) biopsy  of 2 additional questionable areas, one in each breast, showed bilateral fibroadenomas  (1) genetics testing March 2016 through the BreastNext gene panel offered by [facility] showed no mutations in the following 17 genes: ATM, BARD1, BRCA1, BRCA2, BRIP1, CDH1, CHEK2, MRE11A, MUTYH, NBN, NF1, PALB2, PTEN, RAD50, RAD51C, RAD51D, and TP53.  (2) status post left lumpectomy with sentinel lymph node sampling 03/27/2015 for a pT1c pN0, stage IA invasive ductal carcinoma, grade 3, triple negative, with an MIB-1 of 33%  (a) close margins were cleared with subsequent excision 04/05/2015.  (3) Oncotype DX score of 38 predicts a risk of 26% outside the breast recurrence within 10 years if the patient's only systemic treatment is tamoxifen for 5 years  (4) adjuvant chemotherapy started 05/02/2015 consisting of doxorubicin and cyclophosphamide in dose dense fashion 4, with onpro support, followed by paclitaxel weekly 12.  (5) adjuvant radiation to follow chemotherapy  (5) tamoxifen started 02/20/2015 (neoadjuvantly), discontinued 04/19/2015 so as not to overlap chemotherapy; be resumed at the completion of radiation  PLAN: Cheryl [name] is doing well today. The labs were reviewed in detail and were entirely stable. She will proceed with cycle 2 of doxorubicin and cyclophosphamide at planned today.  For her ankle edema I advised she avoid sodium intake and elevate her legs whenever possible.   Cheryl [name] will return  in 1 week for labs and a nadir visit. She understands and agrees with this plan. She knows the goal of treatment in her case is cure. She has been encouraged to call with any issues that might arise before her next visit here.  Laurie Panda, NP   05/16/2015 1:56 PM

## 2015-05-16 NOTE — Progress Notes (Signed)
SYMPTOM MANAGEMENT CLINIC   HPI: Cheryl [name] 39 y.o. female diagnosed with breast cancer.  Currently undergoing dose dense AC chemotherapy regimen.  Patient is complaining of some mild dysuria and frequency of urination; as well as some right flank pain.  She denies any recent fevers or chills.  HPI  ROS  Past Medical History  Diagnosis Date  . Wears contact lenses   . Breast cancer     left breast    Past Surgical History  Procedure Laterality Date  . Breast lumpectomy with needle localization and axillary sentinel lymph node bx Left 03/27/2015    Procedure: LEFT BREAST LUMPECTOMY WITH BRACKETED NEEDLE LOCALIZATION AND LEFT  AXILLARY SENTINEL LYMPH NODE BX;  Surgeon: [name], MD;  Location: [place];  Service: General;  Laterality: Left;  . Breast surgery    . Re-excision of breast lumpectomy Left 04/05/2015    Procedure: RE-EXCISION LEFT  BREAST LUMPECTOMY;  Surgeon: [name], MD;  Location: [place];  Service: General;  Laterality: Left;  . Portacath placement Right 04/25/2015    Procedure: INSERTION PORT-A-CATH;  Surgeon: [name], MD;  Location: [place];  Service: General;  Laterality: Right;    has Breast cancer of upper-outer quadrant of left female breast and UTI (urinary tract infection) on her problem list.    is allergic to adhesive.    Medication List       This list is accurate as of: 05/14/15 11:59 PM.  Always use your most recent med list.               ciprofloxacin 500 MG tablet  Commonly known as:  CIPRO  Take 1 tablet (500 mg total) by mouth 2 (two) times daily.     dexamethasone 4 MG tablet  Commonly known as:  DECADRON  Take 2 tablets by mouth once a day on the day after chemotherapy and then take 2 tablets two times a day for 2 days. Take with food.     ibuprofen 400 MG tablet  Commonly known as:  ADVIL,MOTRIN  Take 400 mg by mouth every 6 (six) hours as  needed.     lidocaine-prilocaine cream  Commonly known as:  EMLA  Apply to affected area once     LORazepam 0.5 MG tablet  Commonly known as:  ATIVAN  Take 1 tablet (0.5 mg total) by mouth at bedtime.     ondansetron 8 MG tablet  Commonly known as:  ZOFRAN  Take 1 tablet (8 mg total) by mouth 2 (two) times daily as needed. Start on the third day after chemotherapy.     prochlorperazine 10 MG tablet  Commonly known as:  COMPAZINE  Take 1 tablet (10 mg total) by mouth every 6 (six) hours as needed (Nausea or vomiting).         PHYSICAL EXAMINATION  Oncology Vitals 05/14/2015 05/09/2015 05/01/2015 04/25/2015 04/25/2015 04/25/2015 04/25/2015  Height - 158 cm 158 cm - - - -  Weight 97.796 kg 95.89 kg 97.659 kg - - - -  Weight (lbs) 215 lbs 10 oz 211 lbs 6 oz 215 lbs 5 oz - - - -  BMI (kg/m2) - 38.67 kg/m2 39.38 kg/m2 - - - -  Temp 98.7 98.1 99.3 99.2 - - -  Pulse 117 95 79 87 86 85 82  Resp 20 18 18 18 22 20 22   SpO2 98 - - 100 98 98 100  BSA (m2) - 2.05 m2  2.07 m2 - - - -   BP Readings from Last 3 Encounters:  05/14/15 130/72  05/09/15 128/83  05/01/15 135/88    Physical Exam  Constitutional: [name] oriented to person, place, and time and well-developed, well-nourished, and in no distress.  HENT:  Head: Normocephalic and atraumatic.  Eyes: Conjunctivae and EOM are normal. Pupils are equal, round, and reactive to light. Right eye exhibits no discharge. Left eye exhibits no discharge. No scleral icterus.  Neck: Normal range of motion.  Pulmonary/Chest: Effort normal. No respiratory distress.  Abdominal: Soft. There is no tenderness.  Trace right flank pain with palpation.  Musculoskeletal: Normal range of motion. She exhibits no edema or tenderness.  Neurological: [name] alert and oriented to person, place, and time. Gait normal.  Skin: Skin is warm and dry. No rash noted. No erythema. No pallor.  Psychiatric: Affect normal.  Nursing note and vitals reviewed.   LABORATORY  DATA:. Appointment on 05/14/2015  Component Date Value Ref Range Status  . Glucose 05/14/2015 Negative  Negative mg/dL Final  . Bilirubin (Urine) 05/14/2015 Negative  Negative Final  . Ketones 05/14/2015 Negative  Negative mg/dL Final  . Specific Gravity, Urine 05/14/2015 1.015  1.003 - 1.035 Final  . Blood 05/14/2015 Moderate  Negative Final  . pH 05/14/2015 5.0  4.6 - 8.0 Final  . Protein 05/14/2015 Negative  Negative- <30 mg/dL Final  . Urobilinogen, UR 05/14/2015 0.2  0.2 - 1 mg/dL Final  . Nitrite 05/14/2015 Negative  Negative Final  . Leukocyte Esterase 05/14/2015 Negative  Negative Final  . RBC / HPF 05/14/2015 0-2  0 - 2 Final  . WBC, UA 05/14/2015 3-6  0 - 2 Final  . Bacteria, UA 05/14/2015 Moderate  Negative- Trace Final  . Epithelial Cells 05/14/2015 Moderate  Negative- Few Final  . COMMENTS: 05/14/2015 less than 5 ccs received  Negative Final     RADIOGRAPHIC STUDIES: No results found.  ASSESSMENT/PLAN:    Breast cancer of upper-outer quadrant of left female breast Patient received her first cycle of dose dense AC chemotherapy on 05/02/2015.  [name] scheduled to return on 05/16/2015 for labs, follow up visit, and her next cycle of chemotherapy.  UTI (urinary tract infection) Patient is complaining of some mild dysuria and frequency of urination; as well as some right flank pain.  She denies any recent fevers or chills.  On exam.-Patient with trace flank pain with palpation.  Denies any tenderness to lower pelvic region with palpation.  Urinalysis obtained today revealed moderate amount of blood, nitrite negative, leukocyte esterase negative, WBC 3-6, and moderate bacteria.  Urine culture results pending.  Will prescribe Cipro antibiotics for probable UTI.  Patient was advised to call/return directly to the emergency department for any worsening symptoms whatsoever.  Also, discussed the possibility of need for further evaluation with CT scan to rule out kidney stone  if symptoms do not resolve with Cipro.   Patient stated understanding of all instructions; and was in agreement with this plan of care. The patient knows to call the clinic with any problems, questions or concerns.   Review/collaboration with Dr. Jana Hakim regarding all aspects of patient's visit today.   Total time spent with patient was 25 minutes;  with greater than 75 percent of that time spent in face to face counseling regarding patient's symptoms,  and coordination of care and follow up.  Disclaimer: This note was dictated with voice recognition software. Similar sounding words can inadvertently be transcribed and may not  be corrected upon review.   Drue Second, NP 05/16/2015

## 2015-05-16 NOTE — Patient Instructions (Signed)
[facility] Discharge Instructions for Patients Receiving Chemotherapy  Today you received the following chemotherapy agents Adria and Cytoxan  To help prevent nausea and vomiting after your treatment, we encourage you to take your nausea medication as prescribed.   If you develop nausea and vomiting that is not controlled by your nausea medication, call the clinic.   BELOW ARE SYMPTOMS THAT SHOULD BE REPORTED IMMEDIATELY:  *FEVER GREATER THAN 100.5 F  *CHILLS WITH OR WITHOUT FEVER  NAUSEA AND VOMITING THAT IS NOT CONTROLLED WITH YOUR NAUSEA MEDICATION  *UNUSUAL SHORTNESS OF BREATH  *UNUSUAL BRUISING OR BLEEDING  TENDERNESS IN MOUTH AND THROAT WITH OR WITHOUT PRESENCE OF ULCERS  *URINARY PROBLEMS  *BOWEL PROBLEMS  UNUSUAL RASH Items with * indicate a potential emergency and should be followed up as soon as possible.  Feel free to call the clinic you have any questions or concerns. The clinic phone number is [phone].  Please show the CHEMO ALERT CARD at check-in to the Emergency Department and triage nurse.   

## 2015-05-16 NOTE — Assessment & Plan Note (Signed)
Patient received her first cycle of dose dense AC chemotherapy on 05/02/2015.  [name] scheduled to return on 05/16/2015 for labs, follow up visit, and her next cycle of chemotherapy.

## 2015-05-16 NOTE — Assessment & Plan Note (Addendum)
Patient is complaining of some mild dysuria and frequency of urination; as well as some right flank pain.  She denies any recent fevers or chills.  On exam.-Patient with trace flank pain with palpation.  Denies any tenderness to lower pelvic region with palpation.  Urinalysis obtained today revealed moderate amount of blood, nitrite negative, leukocyte esterase negative, WBC 3-6, and moderate bacteria.  Urine culture results pending.  Will prescribe Cipro antibiotics for probable UTI.  Patient was advised to call/return directly to the emergency department for any worsening symptoms whatsoever.  Also, discussed the possibility of need for further evaluation with CT scan to rule out kidney stone if symptoms do not resolve with Cipro.

## 2015-05-16 NOTE — Progress Notes (Signed)
1525 Pt c/o "mild headache" request Tylenol. Notified Dr. Jana Hakim and new orders received for Tylenol 650mg  po once.

## 2015-05-16 NOTE — Telephone Encounter (Signed)
Unable to reach pt via phone. Pt to see Towana Badger today for follow up/ chemo visit.

## 2015-05-17 LAB — URINE CULTURE

## 2015-05-21 ENCOUNTER — Other Ambulatory Visit: Payer: Self-pay | Admitting: Nurse Practitioner

## 2015-05-21 ENCOUNTER — Other Ambulatory Visit: Payer: Self-pay | Admitting: *Deleted

## 2015-05-21 DIAGNOSIS — C50412 Malignant neoplasm of upper-outer quadrant of left female breast: Secondary | ICD-10-CM

## 2015-05-21 NOTE — Telephone Encounter (Signed)
Called pt this morning and afternoon in response to her request to have a refill of Ativan. Pt's Ativan was filled just 3 weeks ago today. I called to make sure pt is only taking medication once at bedtime , if so she should have at least 9 - 10 pills left. Too early to fill today. No answer both times I called but left a detailed message for pt to call this nurse back@ [phone]. Pt also needed port cream but has 3 refills already at pharmacy remaining.

## 2015-05-22 ENCOUNTER — Other Ambulatory Visit: Payer: Self-pay

## 2015-05-22 DIAGNOSIS — C50412 Malignant neoplasm of upper-outer quadrant of left female breast: Secondary | ICD-10-CM

## 2015-05-23 ENCOUNTER — Ambulatory Visit (HOSPITAL_BASED_OUTPATIENT_CLINIC_OR_DEPARTMENT_OTHER): Payer: Federal, State, Local not specified - PPO | Admitting: Oncology

## 2015-05-23 ENCOUNTER — Other Ambulatory Visit (HOSPITAL_BASED_OUTPATIENT_CLINIC_OR_DEPARTMENT_OTHER): Payer: Federal, State, Local not specified - PPO

## 2015-05-23 ENCOUNTER — Telehealth: Payer: Self-pay | Admitting: Oncology

## 2015-05-23 VITALS — BP 158/80 | HR 72 | Temp 99.0°F | Resp 18 | Ht 62.0 in | Wt 213.6 lb

## 2015-05-23 DIAGNOSIS — L989 Disorder of the skin and subcutaneous tissue, unspecified: Secondary | ICD-10-CM | POA: Diagnosis not present

## 2015-05-23 DIAGNOSIS — C50412 Malignant neoplasm of upper-outer quadrant of left female breast: Secondary | ICD-10-CM

## 2015-05-23 LAB — COMPREHENSIVE METABOLIC PANEL (CC13)
ALK PHOS: 64 U/L (ref 40–150)
ALT: 11 U/L (ref 0–55)
AST: 8 U/L (ref 5–34)
Albumin: 3.1 g/dL — ABNORMAL LOW (ref 3.5–5.0)
Anion Gap: 10 mEq/L (ref 3–11)
BUN: 9.7 mg/dL (ref 7.0–26.0)
CALCIUM: 9 mg/dL (ref 8.4–10.4)
CO2: 24 mEq/L (ref 22–29)
CREATININE: 0.7 mg/dL (ref 0.6–1.1)
Chloride: 105 mEq/L (ref 98–109)
EGFR: >90 mL/min/{1.73_m2} (ref >90)
Glucose: 141 mg/dl — ABNORMAL HIGH (ref 70–140)
POTASSIUM: 3.6 meq/L (ref 3.5–5.1)
Sodium: 139 mEq/L (ref 136–145)
Total Bilirubin: 0.39 mg/dL (ref 0.20–1.20)
Total Protein: 5.8 g/dL — ABNORMAL LOW (ref 6.4–8.3)

## 2015-05-23 LAB — CBC WITH DIFFERENTIAL/PLATELET
BASO%: 3.1 % — ABNORMAL HIGH (ref 0.0–2.0)
BASOS ABS: 0.1 10*3/uL (ref 0.0–0.1)
EOS%: 0.6 % (ref 0.0–7.0)
Eosinophils Absolute: 0 10*3/uL (ref 0.0–0.5)
HCT: 30.1 % — ABNORMAL LOW (ref 34.8–46.6)
HEMOGLOBIN: 10.3 g/dL — AB (ref 11.6–15.9)
LYMPH#: 1 10*3/uL (ref 0.9–3.3)
LYMPH%: 63.8 % — AB (ref 14.0–49.7)
MCH: 30.5 pg (ref 25.1–34.0)
MCHC: 34.2 g/dL (ref 31.5–36.0)
MCV: 89.1 fL (ref 79.5–101.0)
MONO#: 0.2 10*3/uL (ref 0.1–0.9)
MONO%: 12.5 % (ref 0.0–14.0)
NEUT#: 0.3 10*3/uL — CL (ref 1.5–6.5)
NEUT%: 20 % — AB (ref 38.4–76.8)
Platelets: 190 10*3/uL (ref 145–400)
RBC: 3.38 10*6/uL — ABNORMAL LOW (ref 3.70–5.45)
RDW: 13.6 % (ref 11.2–14.5)
WBC: 1.6 10*3/uL — AB (ref 3.9–10.3)

## 2015-05-23 MED ORDER — LIDOCAINE-PRILOCAINE 2.5-2.5 % EX CREA: TOPICAL_CREAM | CUTANEOUS | Status: DC

## 2015-05-23 MED ORDER — CIPROFLOXACIN HCL 500 MG PO TABS: 500.0000 mg | ORAL_TABLET | Freq: Two times a day (BID) | ORAL | Status: DC

## 2015-05-23 NOTE — Telephone Encounter (Signed)
Scan order noted for patient and she will get a call from central scheduling,avs printed

## 2015-05-23 NOTE — Progress Notes (Signed)
[place]  [phone] [phone]) [phone]     ID: Cheryl [name] DOB: Feb 25, 1976  MR#: [id number]  OZH#:[id number]  Patient Care Team: No Pcp Per Patient as PCP - General (General Practice) Cheryl Compton, MD as Consulting Physician (Obstetrics and Gynecology) Cheryl Seltzer, MD as Consulting Physician (General Surgery) Cheryl Cruel, MD as Consulting Physician (Oncology) Cheryl Silversmith, MD as Consulting Physician (Radiation Oncology) Cheryl Germany, RN as Registered Nurse Cheryl Kaufmann, RN as Registered Nurse Cheryl Bouche, NP as Nurse Practitioner (Nurse Practitioner) PCP: No PCP Per Patient OTHER MD:  CHIEF COMPLAINT: Left breast cancer  CURRENT TREATMENT: Adjuvant chemotherapy   BREAST CANCER HISTORY: From the original intake note:  The patient had screening mammography 02/01/2015 which showed a calcifications in the upper outer quadrant of the left breast. On 02/08/2015 she underwent left diagnostic mammography with tomosynthesis and left breast ultrasonography at the breast Center. The breast density was category C. Mammography confirmed a group of pleomorphic calcifications spanning 4.2 cm maximally. Physical examination of the upper outer quadrant of the left breast showed an area of thickening at the approximate 2:00 position. Ultrasound of this area showed no discrete masses. There was vague shadowing present. Calcifications could not be clearly identified sonographically. There was no lymphadenopathy in the left axilla.  On the same day the patient underwent biopsy of the left breast area in question, with the pathology (S [id number]) showing ductal carcinoma in situ, grade 2 or 3, with possible areas of microinvasion, the cancer cells being strongly estrogen and progesterone receptor positive, both at 100%.  On 02/15/2015 the patient underwent bilateral breast MRI. This showed the area of malignancy in the upper outer quadrant  to measure 4.5 cm, including a large area of non-masslike enhancement and a 1 cm spiculated mass. There was also an indeterminate oval mass in the central left breast and another indeterminant mass in the slightly outer right breast anteriorly. Biopsy of the right breast mass 02/18/2015 ([id number]) showed a fibroadenoma, with no evidence of malignancy. Biopsy of 2 additional areas of the left breast (at 10:00 and 2:00) showed a fibroadenoma in the upper inner quadrant, and pseudo-angiomatous stromal hyperplasia (PA SH) at the 2:00 area of the left breast.  The patient's subsequent history is as detailed below.  INTERVAL HISTORY: Cheryl [name] returns today for follow-up of her triple negative invasive breast cancer. Today is day 8 cycle 2 of 4 planned cycles of cyclophosphamide and doxorubicin given every 14 days, with Neulasta support, to be followed by paclitaxel weekly 12.  REVIEW OF SYSTEMS: Cheryl [name] is tolerating chemotherapy of moderately well. This second cycle was not as bad as the first, she said. She has less nausea, and she did not have any vomiting. She does feel tired. She had no problems with fever or mouth sores. She had a cough which was minimally productive about 10 days ago but now is minimal and dry. She has not had a period since May. [name] having minimal hot flashes--just feeling a little warm at night. One issue that she wants discussed: She buzzed her hair and discovered a "bump" on her scalp that she wanted me to look at. A detailed review of systems today was otherwise stable   PAST MEDICAL HISTORY: Past Medical History  Diagnosis Date  . Wears contact lenses   . Breast cancer     left breast    PAST SURGICAL HISTORY: Past Surgical History  Procedure Laterality Date  . Breast  lumpectomy with needle localization and axillary sentinel lymph node bx Left 03/27/2015    Procedure: LEFT BREAST LUMPECTOMY WITH BRACKETED NEEDLE LOCALIZATION AND LEFT  AXILLARY SENTINEL LYMPH NODE  BX;  Surgeon: Cheryl Seltzer, MD;  Location: [facility];  Service: General;  Laterality: Left;  . Breast surgery    . Re-excision of breast lumpectomy Left 04/05/2015    Procedure: RE-EXCISION LEFT  BREAST LUMPECTOMY;  Surgeon: Cheryl Seltzer, MD;  Location: [facility];  Service: General;  Laterality: Left;  . Portacath placement Right 04/25/2015    Procedure: INSERTION PORT-A-CATH;  Surgeon: Cheryl Seltzer, MD;  Location: [facility];  Service: General;  Laterality: Right;    FAMILY HISTORY Family History  Problem Relation Age of Onset  . Lung cancer Maternal Grandmother 75    non smoker  . Lung cancer Maternal Grandfather 16    non smoker worked in [employer]  . Cancer Paternal Grandfather 35    throat cancer ? smoker  . Cancer Cousin 41    throat cancer ? smoker   the patient's parents are living, in their mid-50s as of March 2016. The patient had no siblings. Both the patient's mother's parents died from lung cancer although they did not smoke. [place] and her maternal grandfather was a Ecologist. There is no history of breast or ovarian cancer in the family to her knowledge  GYNECOLOGIC HISTORY:  No LMP recorded. Menarche age 89, first live birth age 14. The patient is GX P3. [name] having regular periods. She uses the Essure device for contraception.  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 Her husband Cheryl [name] is a Freight forwarder. Their daughter Cheryl [name] lives in [place] where she works in Press photographer. Daughter Cheryl [name] is a Research scientist (physical sciences) and lives with the patient.. Daughter Cheryl [name] also at home is 77 years old.    ADVANCED DIRECTIVES: Not in place   HEALTH MAINTENANCE: History  Substance Use Topics  . Smoking status: Never Smoker   . Smokeless tobacco: Never Used  . Alcohol Use: Yes     Colonoscopy:  PAP:  Bone density:  Lipid panel:  Allergies  Allergen Reactions  .  Adhesive [Tape] Other (See Comments)    Skin blisters    Current Outpatient Prescriptions  Medication Sig Dispense Refill  . ciprofloxacin (CIPRO) 500 MG tablet Take 1 tablet (500 mg total) by mouth 2 (two) times daily. 10 tablet 0  . dexamethasone (DECADRON) 4 MG tablet Take 2 tablets by mouth once a day on the day after chemotherapy and then take 2 tablets two times a day for 2 days. Take with food. 30 tablet 1  . ibuprofen (ADVIL,MOTRIN) 400 MG tablet Take 400 mg by mouth every 6 (six) hours as needed.    . lidocaine-prilocaine (EMLA) cream Apply to affected area once 30 g 3  . LORazepam (ATIVAN) 0.5 MG tablet Take 1 tablet (0.5 mg total) by mouth at bedtime. 30 tablet 0  . ondansetron (ZOFRAN) 8 MG tablet Take 1 tablet (8 mg total) by mouth 2 (two) times daily as needed. Start on the third day after chemotherapy. 30 tablet 1  . prochlorperazine (COMPAZINE) 10 MG tablet Take 1 tablet (10 mg total) by mouth every 6 (six) hours as needed (Nausea or vomiting). 30 tablet 1   No current facility-administered medications for this visit.    OBJECTIVE: There-appearing white woman who appears stated age 39 Vitals:  05/23/15 1303  BP: 158/80  Pulse: 72  Temp: 99 F (37.2 C)  Resp: 18     Body mass index is 39.06 kg/(m^2).    ECOG FS:0 - Asymptomatic  Sclerae unicteric, pupils round and equal Oropharynx clear and moist-- no thrush or other lesions No cervical or supraclavicular adenopathy Lungs no rales or rhonchi Heart regular rate and rhythm Abd soft, nontender, positive bowel sounds MSK no focal spinal tenderness, no upper extremity lymphedema Neuro: nonfocal, well oriented, appropriate affect Breasts: Deferred Skin: She has a scalp lesion which is imaged below. It is soft, not quite cystic, feels more like fatty density. The patient does not know how long this may have been there: It clearly was covered over by her hair  Photo 05/23/2015    LAB RESULTS:  CMP       Component Value Date/Time   NA 139 05/23/2015 1220   K 3.6 05/23/2015 1220   CO2 24 05/23/2015 1220   GLUCOSE 141* 05/23/2015 1220   BUN 9.7 05/23/2015 1220   CREATININE 0.7 05/23/2015 1220   CALCIUM 9.0 05/23/2015 1220   PROT 5.8* 05/23/2015 1220   ALBUMIN 3.1* 05/23/2015 1220   AST 8 05/23/2015 1220   ALT 11 05/23/2015 1220   ALKPHOS 64 05/23/2015 1220   BILITOT 0.39 05/23/2015 1220    INo results found for: SPEP, UPEP  Lab Results  Component Value Date   WBC 1.6* 05/23/2015   NEUTROABS 0.3* 05/23/2015   HGB 10.3* 05/23/2015   HCT 30.1* 05/23/2015   MCV 89.1 05/23/2015   PLT 190 05/23/2015      Chemistry      Component Value Date/Time   NA 139 05/23/2015 1220   K 3.6 05/23/2015 1220   CO2 24 05/23/2015 1220   BUN 9.7 05/23/2015 1220   CREATININE 0.7 05/23/2015 1220      Component Value Date/Time   CALCIUM 9.0 05/23/2015 1220   ALKPHOS 64 05/23/2015 1220   AST 8 05/23/2015 1220   ALT 11 05/23/2015 1220   BILITOT 0.39 05/23/2015 1220       No results found for: LABCA2  No components found for: LABCA125  No results for input(s): INR in the last 168 hours.  Urinalysis    Component Value Date/Time   LABSPEC 1.015 05/14/2015 1557   PHURINE 5.0 05/14/2015 1557   GLUCOSEU Negative 05/14/2015 1557   HGBUR Moderate 05/14/2015 1557   BILIRUBINUR Negative 05/14/2015 1557   KETONESUR Negative 05/14/2015 1557   PROTEINUR Negative 05/14/2015 1557   UROBILINOGEN 0.2 05/14/2015 1557   NITRITE Negative 05/14/2015 1557   LEUKOCYTESUR Negative 05/14/2015 1557    STUDIES: Dg Chest Port 1 View  04/25/2015   CLINICAL DATA:  Breast carcinoma and status post Port-A-Cath placement.  EXAM: PORTABLE CHEST - 1 VIEW  COMPARISON:  None.  FINDINGS: Right jugular Port-A-Cath has been placed with the catheter tip at the level of the SVC. No pneumothorax present. Lungs show mild bibasilar atelectasis. There is no evidence of pulmonary edema, consolidation, nodule or pleural  fluid. The heart size is normal.  IMPRESSION: Tip of Port-A-Cath in the SVC.  No pneumothorax.   Electronically Signed   By: Aletta Edouard M.D.   On: 04/25/2015 08:56   Dg Fluoro Guide Cv Line-no Report  04/25/2015   CLINICAL DATA:    FLOURO GUIDE CV LINE  Fluoroscopy was utilized by the requesting physician.  No radiographic  interpretation.     ASSESSMENT: 39 y.o. [place], [place] woman status  post left breast biopsy 02/08/2015 for ductal carcinoma in situ, grade 2 or 3, strongly estrogen and progesterone receptor positive, with likely areas of microinvasion  (a) biopsy of an area in the left breast upper outer quadrant showed PASH  (b) biopsy of 2 additional questionable areas, one in each breast, showed bilateral fibroadenomas  (1) genetics testing March 2016 through the BreastNext gene panel offered by [employer] showed no mutations in the following 17 genes: ATM, BARD1, BRCA1, BRCA2, BRIP1, CDH1, CHEK2, MRE11A, MUTYH, NBN, NF1, PALB2, PTEN, RAD50, RAD51C, RAD51D, and TP53.  (2) status post left lumpectomy with sentinel lymph node sampling 03/27/2015 for a pT1c pN0, stage IA invasive ductal carcinoma, grade 3, triple negative, with an MIB-1 of 33%  (a) close margins were cleared with subsequent excision 04/05/2015.  (3) Oncotype DX score of 38 predicts a risk of 26% outside the breast recurrence within 10 years if the patient's only systemic treatment is tamoxifen for 5 years  (4) adjuvant chemotherapy started 05/02/2015 consisting of doxorubicin and cyclophosphamide in dose dense fashion 4, with onpro support, followed by paclitaxel weekly 12.  (5) adjuvant radiation to follow chemotherapy  (5) tamoxifen started 02/20/2015 (neoadjuvantly), discontinued 04/19/2015 so as not to overlap chemotherapy; to be resumed at the completion of radiation  PLAN: Harla is tolerating chemotherapy well. Her nadir is under 500 and I am giving her ciprofloxacin for 5 days prophylactically. I reassured  her that the cough that she is having is just residual from her earlier bronchitis.  I am not sure what the lesion on her scalp is. It may be a lipoma and it may have been there for many years. I'm going to set her up for a noncontrast head CT just to evaluate this further.  [name] already scheduled to return for cycle 3 in a week. She knows to call for any problems that may develop before that visit.  Cheryl Cruel, MD   05/24/2015 9:47 AM

## 2015-05-28 ENCOUNTER — Encounter: Payer: Self-pay | Admitting: Nurse Practitioner

## 2015-05-29 ENCOUNTER — Ambulatory Visit (HOSPITAL_COMMUNITY)
Admission: RE | Admit: 2015-05-29 | Discharge: 2015-05-29 | Disposition: A | Discharge status: Home / Self Care | Payer: Federal, State, Local not specified - PPO | Source: Ambulatory Visit | Attending: Oncology | Admitting: Oncology

## 2015-05-29 ENCOUNTER — Encounter (HOSPITAL_COMMUNITY): Payer: Self-pay

## 2015-05-29 DIAGNOSIS — C50912 Malignant neoplasm of unspecified site of left female breast: Secondary | ICD-10-CM | POA: Diagnosis not present

## 2015-05-29 DIAGNOSIS — R51 Headache: Secondary | ICD-10-CM | POA: Insufficient documentation

## 2015-05-29 DIAGNOSIS — C50412 Malignant neoplasm of upper-outer quadrant of left female breast: Secondary | ICD-10-CM

## 2015-05-30 ENCOUNTER — Ambulatory Visit (HOSPITAL_BASED_OUTPATIENT_CLINIC_OR_DEPARTMENT_OTHER): Payer: Federal, State, Local not specified - PPO | Admitting: Nurse Practitioner

## 2015-05-30 ENCOUNTER — Encounter: Payer: Self-pay | Admitting: *Deleted

## 2015-05-30 ENCOUNTER — Ambulatory Visit: Payer: Federal, State, Local not specified - PPO | Admitting: Nurse Practitioner

## 2015-05-30 ENCOUNTER — Other Ambulatory Visit (HOSPITAL_BASED_OUTPATIENT_CLINIC_OR_DEPARTMENT_OTHER): Payer: Federal, State, Local not specified - PPO

## 2015-05-30 ENCOUNTER — Other Ambulatory Visit: Payer: Self-pay | Admitting: *Deleted

## 2015-05-30 ENCOUNTER — Other Ambulatory Visit: Payer: Federal, State, Local not specified - PPO

## 2015-05-30 ENCOUNTER — Ambulatory Visit (HOSPITAL_BASED_OUTPATIENT_CLINIC_OR_DEPARTMENT_OTHER): Payer: Federal, State, Local not specified - PPO

## 2015-05-30 ENCOUNTER — Encounter: Payer: Self-pay | Admitting: Nurse Practitioner

## 2015-05-30 ENCOUNTER — Ambulatory Visit: Payer: Federal, State, Local not specified - PPO

## 2015-05-30 ENCOUNTER — Telehealth: Payer: Self-pay | Admitting: *Deleted

## 2015-05-30 VITALS — BP 125/69 | HR 102 | Temp 98.7°F | Resp 18 | Wt 215.4 lb

## 2015-05-30 VITALS — BP 125/75 | HR 99 | Temp 98.9°F | Resp 18

## 2015-05-30 DIAGNOSIS — C50412 Malignant neoplasm of upper-outer quadrant of left female breast: Secondary | ICD-10-CM

## 2015-05-30 DIAGNOSIS — Z5111 Encounter for antineoplastic chemotherapy: Secondary | ICD-10-CM

## 2015-05-30 DIAGNOSIS — R110 Nausea: Secondary | ICD-10-CM | POA: Diagnosis not present

## 2015-05-30 DIAGNOSIS — R300 Dysuria: Secondary | ICD-10-CM

## 2015-05-30 DIAGNOSIS — R109 Unspecified abdominal pain: Secondary | ICD-10-CM | POA: Insufficient documentation

## 2015-05-30 DIAGNOSIS — Z5189 Encounter for other specified aftercare: Secondary | ICD-10-CM | POA: Diagnosis not present

## 2015-05-30 LAB — URINALYSIS, MICROSCOPIC - CHCC
BILIRUBIN (URINE): NEGATIVE
GLUCOSE UR CHCC: NEGATIVE mg/dL
Ketones: NEGATIVE mg/dL
Leukocyte Esterase: NEGATIVE
NITRITE: NEGATIVE
Protein: NEGATIVE mg/dL
Specific Gravity, Urine: 1.02 (ref 1.003–1.035)
Urobilinogen, UR: 0.2 mg/dL (ref 0.2–1)
pH: 6 (ref 4.6–8.0)

## 2015-05-30 LAB — COMPREHENSIVE METABOLIC PANEL (CC13)
ALT: 16 U/L (ref 0–55)
AST: 14 U/L (ref 5–34)
Albumin: 3.3 g/dL — ABNORMAL LOW (ref 3.5–5.0)
Alkaline Phosphatase: 70 U/L (ref 40–150)
Anion Gap: 12 mEq/L — ABNORMAL HIGH (ref 3–11)
BILIRUBIN TOTAL: 0.46 mg/dL (ref 0.20–1.20)
BUN: 11.7 mg/dL (ref 7.0–26.0)
CO2: 23 mEq/L (ref 22–29)
Calcium: 9 mg/dL (ref 8.4–10.4)
Chloride: 105 mEq/L (ref 98–109)
Creatinine: 0.8 mg/dL (ref 0.6–1.1)
EGFR: 88 mL/min/{1.73_m2} — ABNORMAL LOW (ref >90)
Glucose: 104 mg/dl (ref 70–140)
Potassium: 4.1 mEq/L (ref 3.5–5.1)
Sodium: 140 mEq/L (ref 136–145)
Total Protein: 6.4 g/dL (ref 6.4–8.3)

## 2015-05-30 LAB — CBC WITH DIFFERENTIAL/PLATELET
BASO%: 0.9 % (ref 0.0–2.0)
Basophils Absolute: 0.1 10*3/uL (ref 0.0–0.1)
EOS%: 0.5 % (ref 0.0–7.0)
Eosinophils Absolute: 0.1 10*3/uL (ref 0.0–0.5)
HCT: 30.4 % — ABNORMAL LOW (ref 34.8–46.6)
HGB: 10.2 g/dL — ABNORMAL LOW (ref 11.6–15.9)
LYMPH%: 13.5 % — ABNORMAL LOW (ref 14.0–49.7)
MCH: 30.4 pg (ref 25.1–34.0)
MCHC: 33.6 g/dL (ref 31.5–36.0)
MCV: 90.7 fL (ref 79.5–101.0)
MONO#: 1 10*3/uL — ABNORMAL HIGH (ref 0.1–0.9)
MONO%: 8.3 % (ref 0.0–14.0)
NEUT#: 9.5 10*3/uL — ABNORMAL HIGH (ref 1.5–6.5)
NEUT%: 76.8 % (ref 38.4–76.8)
Platelets: 175 10*3/uL (ref 145–400)
RBC: 3.35 10*6/uL — AB (ref 3.70–5.45)
RDW: 14.4 % (ref 11.2–14.5)
WBC: 12.4 10*3/uL — AB (ref 3.9–10.3)
lymph#: 1.7 10*3/uL (ref 0.9–3.3)

## 2015-05-30 MED ORDER — PALONOSETRON HCL INJECTION 0.25 MG/5ML
0.2500 mg | Freq: Once | INTRAVENOUS | Status: AC
  Administered 2015-05-30: 0.25 mg via INTRAVENOUS

## 2015-05-30 MED ORDER — HEPARIN SOD (PORK) LOCK FLUSH 100 UNIT/ML IV SOLN
500.0000 [IU] | Freq: Once | INTRAVENOUS | Status: AC | PRN
  Administered 2015-05-30: 500 [IU]
  Filled 2015-05-30: qty 5

## 2015-05-30 MED ORDER — SODIUM CHLORIDE 0.9 % IV SOLN
Freq: Once | INTRAVENOUS | Status: AC
  Administered 2015-05-30: 15:00:00 via INTRAVENOUS
  Filled 2015-05-30: qty 5

## 2015-05-30 MED ORDER — DOXORUBICIN HCL CHEMO IV INJECTION 2 MG/ML
60.0000 mg/m2 | Freq: Once | INTRAVENOUS | Status: AC
  Administered 2015-05-30: 124 mg via INTRAVENOUS
  Filled 2015-05-30: qty 62

## 2015-05-30 MED ORDER — PEGFILGRASTIM 6 MG/0.6ML ~~LOC~~ PSKT
6.0000 mg | PREFILLED_SYRINGE | Freq: Once | SUBCUTANEOUS | Status: AC
  Administered 2015-05-30: 6 mg via SUBCUTANEOUS
  Filled 2015-05-30: qty 0.6

## 2015-05-30 MED ORDER — SODIUM CHLORIDE 0.9 % IV SOLN
600.0000 mg/m2 | Freq: Once | INTRAVENOUS | Status: AC
  Administered 2015-05-30: 1240 mg via INTRAVENOUS
  Filled 2015-05-30: qty 62

## 2015-05-30 MED ORDER — SODIUM CHLORIDE 0.9 % IV SOLN
Freq: Once | INTRAVENOUS | Status: AC
  Administered 2015-05-30: 15:00:00 via INTRAVENOUS

## 2015-05-30 MED ORDER — SODIUM CHLORIDE 0.9 % IJ SOLN
10.0000 mL | INTRAMUSCULAR | Status: DC | PRN
  Administered 2015-05-30: 10 mL
  Filled 2015-05-30: qty 10

## 2015-05-30 MED ORDER — PALONOSETRON HCL INJECTION 0.25 MG/5ML
INTRAVENOUS | Status: AC
  Filled 2015-05-30: qty 5

## 2015-05-30 NOTE — Progress Notes (Signed)
Discussed with pt the results of urinalysis done on today, which did not show any infection , but explained to pt we have done a urine culture as well and when the results come back we will call her to let her know if that shows anything out of the ordinary. I did encourage the patient to increase water intake as did NP earlier. Pt verbalized understanding.

## 2015-05-30 NOTE — Progress Notes (Signed)
[facility]  [phone] [phone]     ID: Cheryl [name] DOB: 1976/10/07  MR#: [id number]  EXH#:[id number]  Patient Care Team: No Pcp Per Patient as PCP - General (General Practice) [name], MD as Consulting Physician (Obstetrics and Gynecology) [name], MD as Consulting Physician (General Surgery) [name], MD as Consulting Physician (Oncology) [name], MD as Consulting Physician (Radiation Oncology) [name], RN as Registered Nurse [name], RN as Registered Nurse [name], NP as Nurse Practitioner (Nurse Practitioner) PCP: No PCP Per Patient OTHER MD:  CHIEF COMPLAINT: Left breast cancer  CURRENT TREATMENT: Adjuvant chemotherapy   BREAST CANCER HISTORY: From the original intake note:  The patient had screening mammography 02/01/2015 which showed a calcifications in the upper outer quadrant of the left breast. On 02/08/2015 she underwent left diagnostic mammography with tomosynthesis and left breast ultrasonography at the breast Center. The breast density was category C. Mammography confirmed a group of pleomorphic calcifications spanning 4.2 cm maximally. Physical examination of the upper outer quadrant of the left breast showed an area of thickening at the approximate 2:00 position. Ultrasound of this area showed no discrete masses. There was vague shadowing present. Calcifications could not be clearly identified sonographically. There was no lymphadenopathy in the left axilla.  On the same day the patient underwent biopsy of the left breast area in question, with the pathology (S [id number]) showing ductal carcinoma in situ, grade 2 or 3, with possible areas of microinvasion, the cancer cells being strongly estrogen and progesterone receptor positive, both at 100%.  On 02/15/2015 the patient underwent bilateral breast MRI. This showed the area of malignancy in the upper outer quadrant  to measure 4.5 cm, including a large area of non-masslike enhancement and a 1 cm spiculated mass. There was also an indeterminate oval mass in the central left breast and another indeterminant mass in the slightly outer right breast anteriorly. Biopsy of the right breast mass 02/18/2015 ([id number]) showed a fibroadenoma, with no evidence of malignancy. Biopsy of 2 additional areas of the left breast (at 10:00 and 2:00) showed a fibroadenoma in the upper inner quadrant, and pseudo-angiomatous stromal hyperplasia (PA SH) at the 2:00 area of the left breast.  The patient's subsequent history is as detailed below.  INTERVAL HISTORY: Cheryl [name] returns today for follow-up of her triple negative invasive breast cancer. Today is day 1 cycle 3 of 4 planned cycles of cyclophosphamide and doxorubicin given every 14 days, with Neulasta support, to be followed by paclitaxel weekly 12.   REVIEW OF SYSTEMS: Cheryl [name] denies fevers or chills. She experienced mild nausea, but none this past week. Occasionally she is constipated and takes the appropriate meds for this. Her appetite is good and she is drinking well. She had a cold sore to the corner of her mouth that resolved after applying carmex. For the past few days she has experienced right flank pain, similar to what she felt with her last UTI. She denies any accompanying symptoms. Her dry cough continues, but she has not taken anything for this. A detailed review of systems is otherwise stable.   PAST MEDICAL HISTORY: Past Medical History  Diagnosis Date  . Wears contact lenses   . Breast cancer     left breast    PAST SURGICAL HISTORY: Past Surgical History  Procedure Laterality Date  . Breast lumpectomy with needle localization and axillary sentinel lymph node bx Left 03/27/2015    Procedure: LEFT BREAST LUMPECTOMY  WITH BRACKETED NEEDLE LOCALIZATION AND LEFT  AXILLARY SENTINEL LYMPH NODE BX;  Surgeon: [name], MD;  Location: [facility];  Service: General;  Laterality: Left;  . Breast surgery    . Re-excision of breast lumpectomy Left 04/05/2015    Procedure: RE-EXCISION LEFT  BREAST LUMPECTOMY;  Surgeon: [name], MD;  Location: [facility];  Service: General;  Laterality: Left;  . Portacath placement Right 04/25/2015    Procedure: INSERTION PORT-A-CATH;  Surgeon: [name], MD;  Location: [facility];  Service: General;  Laterality: Right;    FAMILY HISTORY Family History  Problem Relation Age of Onset  . Lung cancer Maternal Grandmother 80    non smoker  . Lung cancer Maternal Grandfather 12    non smoker worked in [employer]  . Cancer Paternal Grandfather 35    throat cancer ? smoker  . Cancer Cousin 41    throat cancer ? smoker   the patient's parents are living, in their mid-50s as of March 2016. The patient had no siblings. Both the patient's mother's parents died from lung cancer although they did not smoke. [place] and her maternal grandfather was a Ecologist. There is no history of breast or ovarian cancer in the family to her knowledge  GYNECOLOGIC HISTORY:  No LMP recorded. Patient is not currently having periods (Reason: Chemotherapy). Menarche age 39, first live birth age 39. The patient is GX P3. [name] having regular periods. She uses the Essure device for contraception.  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 Her husband [name] is a Freight forwarder. Their daughter [name] lives in [place] where she works in Press photographer. Daughter Summer is a Research scientist (physical sciences) and lives with the patient.. Daughter [name] also at home is 63 years old.    ADVANCED DIRECTIVES: Not in place   HEALTH MAINTENANCE: History  Substance Use Topics  . Smoking status: Never Smoker   . Smokeless tobacco: Never Used  . Alcohol Use: Yes     Colonoscopy:  PAP:  Bone density:  Lipid panel:  Allergies  Allergen Reactions  .  Adhesive [Tape] Other (See Comments)    Skin blisters    Current Outpatient Prescriptions  Medication Sig Dispense Refill  . dexamethasone (DECADRON) 4 MG tablet Take 2 tablets by mouth once a day on the day after chemotherapy and then take 2 tablets two times a day for 2 days. Take with food. 30 tablet 1  . ibuprofen (ADVIL,MOTRIN) 400 MG tablet Take 400 mg by mouth every 6 (six) hours as needed.    . lidocaine-prilocaine (EMLA) cream Apply to affected area once 30 g 3  . LORazepam (ATIVAN) 0.5 MG tablet Take 1 tablet (0.5 mg total) by mouth at bedtime. 30 tablet 0  . ondansetron (ZOFRAN) 8 MG tablet Take 1 tablet (8 mg total) by mouth 2 (two) times daily as needed. Start on the third day after chemotherapy. 30 tablet 1  . prochlorperazine (COMPAZINE) 10 MG tablet Take 1 tablet (10 mg total) by mouth every 6 (six) hours as needed (Nausea or vomiting). 30 tablet 1  . ciprofloxacin (CIPRO) 500 MG tablet Take 1 tablet (500 mg total) by mouth 2 (two) times daily. (Patient not taking: Reported on 05/30/2015) 10 tablet 0   No current facility-administered medications for this visit.    OBJECTIVE: There-appearing white woman who appears stated age There were no vitals filed for this  visit.   There is no weight on file to calculate BMI.    ECOG FS:0 - Asymptomatic  Skin: warm, dry  HEENT: sclerae anicteric, conjunctivae pink, oropharynx clear. No thrush or mucositis.  Lymph Nodes: No cervical or supraclavicular lymphadenopathy  Lungs: clear to auscultation bilaterally, no rales, wheezes, or rhonci. Non productive cough.  Heart: regular rate and rhythm  Abdomen: round, soft, non tender, positive bowel sounds  Musculoskeletal: No focal spinal tenderness, no peripheral edema  Neuro: non focal, well oriented, positive affect  Breasts: deferred  Photo 05/23/2015    LAB RESULTS:  CMP     Component Value Date/Time   NA 140 05/30/2015 1414   K 4.1 05/30/2015 1414   CO2 23 05/30/2015 1414    GLUCOSE 104 05/30/2015 1414   BUN 11.7 05/30/2015 1414   CREATININE 0.8 05/30/2015 1414   CALCIUM 9.0 05/30/2015 1414   PROT 6.4 05/30/2015 1414   ALBUMIN 3.3* 05/30/2015 1414   AST 14 05/30/2015 1414   ALT 16 05/30/2015 1414   ALKPHOS 70 05/30/2015 1414   BILITOT 0.46 05/30/2015 1414    INo results found for: SPEP, UPEP  Lab Results  Component Value Date   WBC 12.4* 05/30/2015   NEUTROABS 9.5* 05/30/2015   HGB 10.2* 05/30/2015   HCT 30.4* 05/30/2015   MCV 90.7 05/30/2015   PLT 175 05/30/2015      Chemistry      Component Value Date/Time   NA 140 05/30/2015 1414   K 4.1 05/30/2015 1414   CO2 23 05/30/2015 1414   BUN 11.7 05/30/2015 1414   CREATININE 0.8 05/30/2015 1414      Component Value Date/Time   CALCIUM 9.0 05/30/2015 1414   ALKPHOS 70 05/30/2015 1414   AST 14 05/30/2015 1414   ALT 16 05/30/2015 1414   BILITOT 0.46 05/30/2015 1414       No results found for: LABCA2  No components found for: LABCA125  No results for input(s): INR in the last 168 hours.  Urinalysis    Component Value Date/Time   LABSPEC 1.015 05/14/2015 1557   PHURINE 5.0 05/14/2015 1557   GLUCOSEU Negative 05/14/2015 1557   HGBUR Moderate 05/14/2015 1557   BILIRUBINUR Negative 05/14/2015 1557   KETONESUR Negative 05/14/2015 1557   PROTEINUR Negative 05/14/2015 1557   UROBILINOGEN 0.2 05/14/2015 1557   NITRITE Negative 05/14/2015 1557   LEUKOCYTESUR Negative 05/14/2015 1557    STUDIES: Ct Head Wo Contrast  05/29/2015   CLINICAL DATA:  LEFT breast cancer. Headaches every day for 1 week. Vertex soft tissue swelling may be more noticeable due to hair loss, but evaluate for skull lesion.  EXAM: CT HEAD WITHOUT CONTRAST  TECHNIQUE: Contiguous axial images were obtained from the base of the skull through the vertex without intravenous contrast.  COMPARISON:  None.  FINDINGS: No evidence for acute infarction, hemorrhage, mass lesion, hydrocephalus, or extra-axial fluid. There is no  atrophy or white matter disease. The calvarium is intact. Clear orbits, sinuses, and mastoids.  There is a scalp soft tissue density LEFT paramedian location near the frontal vertex, approximately 1 x 4 cm cross-section. This appears to be some type of benign scalp lesion. Correlate with physical exam. Sebaceous cyst is statistically most likely. Underlying skull is normal.  IMPRESSION: No acute intracranial findings.  No mass lesion is evident.  Soft tissue density LEFT paramedian frontal scalp, likely incidental benign scalp lesion. No underlying osseous abnormality.   Electronically Signed   By: Roderic Ovens.D.  On: 05/29/2015 15:28    ASSESSMENT: 39 y.o. [place], [place] woman status post left breast biopsy 02/08/2015 for ductal carcinoma in situ, grade 2 or 3, strongly estrogen and progesterone receptor positive, with likely areas of microinvasion  (a) biopsy of an area in the left breast upper outer quadrant showed PASH  (b) biopsy of 2 additional questionable areas, one in each breast, showed bilateral fibroadenomas  (1) genetics testing March 2016 through the BreastNext gene panel offered by [employer] showed no mutations in the following 17 genes: ATM, BARD1, BRCA1, BRCA2, BRIP1, CDH1, CHEK2, MRE11A, MUTYH, NBN, NF1, PALB2, PTEN, RAD50, RAD51C, RAD51D, and TP53.  (2) status post left lumpectomy with sentinel lymph node sampling 03/27/2015 for a pT1c pN0, stage IA invasive ductal carcinoma, grade 3, triple negative, with an MIB-1 of 33%  (a) close margins were cleared with subsequent excision 04/05/2015.  (3) Oncotype DX score of 38 predicts a risk of 26% outside the breast recurrence within 10 years if the patient's only systemic treatment is tamoxifen for 5 years  (4) adjuvant chemotherapy started 05/02/2015 consisting of doxorubicin and cyclophosphamide in dose dense fashion 4, with onpro support, followed by paclitaxel weekly 12.  (5) adjuvant radiation to follow chemotherapy  (5)  tamoxifen started 02/20/2015 (neoadjuvantly), discontinued 04/19/2015 so as not to overlap chemotherapy; to be resumed at the completion of radiation  PLAN: The labs were reviewed in detail and Cheryl [name]'s ANC has rebounded to an acceptable level. She will proceed with cycle 3 of doxorubicin and cyclophosphamide as planned today.   We are running a urinalysis and urine culture to investigate her right flank pain. She was treated with cipro last week for her neutropenia, so in the case of a UTI we will need to switch drugs.   I have suggested she try robitussin for her persistent dry cough. We reviewed her head CT that showed no evidence of malignant findings.  Cheryl [name] will return in 1 week for labs and a nadir visit. She understands and agrees with this plan. She knows the goal of treatment in her case is cure. She has been encouraged to call with any issues that might arise before her next visit here.    Laurie Panda, NP   05/30/2015 3:09 PM

## 2015-05-30 NOTE — Patient Instructions (Signed)
[facility] Discharge Instructions for Patients Receiving Chemotherapy  Today you received the following chemotherapy agents Adriamycin, Cytoxan.  To help prevent nausea and vomiting after your treatment, we encourage you to take your nausea medication as directed.   If you develop nausea and vomiting that is not controlled by your nausea medication, call the clinic.   BELOW ARE SYMPTOMS THAT SHOULD BE REPORTED IMMEDIATELY:  *FEVER GREATER THAN 100.5 F  *CHILLS WITH OR WITHOUT FEVER  NAUSEA AND VOMITING THAT IS NOT CONTROLLED WITH YOUR NAUSEA MEDICATION  *UNUSUAL SHORTNESS OF BREATH  *UNUSUAL BRUISING OR BLEEDING  TENDERNESS IN MOUTH AND THROAT WITH OR WITHOUT PRESENCE OF ULCERS  *URINARY PROBLEMS  *BOWEL PROBLEMS  UNUSUAL RASH Items with * indicate a potential emergency and should be followed up as soon as possible.  Feel free to call the clinic you have any questions or concerns. The clinic phone number is [phone].  Please show the CHEMO ALERT CARD at check-in to the Emergency Department and triage nurse.   

## 2015-05-30 NOTE — Telephone Encounter (Signed)
This RN called pt per MD review to inform her of " no evidence of cancer " per brain MRI.  Obtained unidentified VM. General message left to return call.

## 2015-05-31 LAB — URINE CULTURE

## 2015-06-03 ENCOUNTER — Telehealth: Payer: Self-pay | Admitting: *Deleted

## 2015-06-03 NOTE — Telephone Encounter (Signed)
Called pt to let her know the results of the urine culture which was negative. No answer but left a detailed message to VM and if she has any questions she can call this nurse back @ [phone]. Also reminded pt of appt on 06/06/15 to see Gentry Fitz, NP. Message to be forwarded to [facility].

## 2015-06-06 ENCOUNTER — Other Ambulatory Visit (HOSPITAL_BASED_OUTPATIENT_CLINIC_OR_DEPARTMENT_OTHER): Payer: Federal, State, Local not specified - PPO

## 2015-06-06 ENCOUNTER — Ambulatory Visit (HOSPITAL_BASED_OUTPATIENT_CLINIC_OR_DEPARTMENT_OTHER): Payer: Federal, State, Local not specified - PPO | Admitting: Nurse Practitioner

## 2015-06-06 ENCOUNTER — Telehealth: Payer: Self-pay | Admitting: Nurse Practitioner

## 2015-06-06 ENCOUNTER — Encounter: Payer: Self-pay | Admitting: Nurse Practitioner

## 2015-06-06 VITALS — BP 123/72 | HR 113 | Temp 98.6°F | Resp 18 | Ht 62.0 in | Wt 212.1 lb

## 2015-06-06 DIAGNOSIS — C50412 Malignant neoplasm of upper-outer quadrant of left female breast: Secondary | ICD-10-CM | POA: Diagnosis not present

## 2015-06-06 LAB — COMPREHENSIVE METABOLIC PANEL (CC13)
ALBUMIN: 3.3 g/dL — AB (ref 3.5–5.0)
ALT: 14 U/L (ref 0–55)
ANION GAP: 9 meq/L (ref 3–11)
AST: 10 U/L (ref 5–34)
Alkaline Phosphatase: 76 U/L (ref 40–150)
BUN: 11.1 mg/dL (ref 7.0–26.0)
CALCIUM: 9 mg/dL (ref 8.4–10.4)
CO2: 23 mEq/L (ref 22–29)
CREATININE: 0.7 mg/dL (ref 0.6–1.1)
Chloride: 107 mEq/L (ref 98–109)
EGFR: >90 mL/min/{1.73_m2} (ref >90)
Glucose: 150 mg/dl — ABNORMAL HIGH (ref 70–140)
Potassium: 3.7 mEq/L (ref 3.5–5.1)
Sodium: 139 mEq/L (ref 136–145)
Total Bilirubin: 0.44 mg/dL (ref 0.20–1.20)
Total Protein: 6.1 g/dL — ABNORMAL LOW (ref 6.4–8.3)

## 2015-06-06 LAB — CBC WITH DIFFERENTIAL/PLATELET
BASO%: 1.7 % (ref 0.0–2.0)
BASOS ABS: 0 10*3/uL (ref 0.0–0.1)
EOS%: 1.4 % (ref 0.0–7.0)
Eosinophils Absolute: 0 10*3/uL (ref 0.0–0.5)
HEMATOCRIT: 29.1 % — AB (ref 34.8–46.6)
HGB: 9.7 g/dL — ABNORMAL LOW (ref 11.6–15.9)
LYMPH%: 41.8 % (ref 14.0–49.7)
MCH: 29.9 pg (ref 25.1–34.0)
MCHC: 33.3 g/dL (ref 31.5–36.0)
MCV: 89.7 fL (ref 79.5–101.0)
MONO#: 0.3 10*3/uL (ref 0.1–0.9)
MONO%: 15.5 % — ABNORMAL HIGH (ref 0.0–14.0)
NEUT#: 0.9 10*3/uL — ABNORMAL LOW (ref 1.5–6.5)
NEUT%: 39.6 % (ref 38.4–76.8)
PLATELETS: 194 10*3/uL (ref 145–400)
RBC: 3.24 10*6/uL — AB (ref 3.70–5.45)
RDW: 14.3 % (ref 11.2–14.5)
WBC: 2.2 10*3/uL — AB (ref 3.9–10.3)
lymph#: 0.9 10*3/uL (ref 0.9–3.3)

## 2015-06-06 NOTE — Telephone Encounter (Signed)
Gave avs & calendar for July/August. °

## 2015-06-06 NOTE — Progress Notes (Signed)
[place]  [phone] [phone]     ID: Cheryl [name] DOB: 10-20-1976  MR#: [id number]  TDS#:[id number]  Patient Care Team: No Pcp Per Patient as PCP - General (General Practice) [name], MD as Consulting Physician (Obstetrics and Gynecology) [name], MD as Consulting Physician (General Surgery) [name], MD as Consulting Physician (Oncology) [name], MD as Consulting Physician (Radiation Oncology) [name], RN as Registered Nurse [name], RN as Registered Nurse [name], NP as Nurse Practitioner (Nurse Practitioner) PCP: No PCP Per Patient OTHER MD:  CHIEF COMPLAINT: Left breast cancer  CURRENT TREATMENT: Adjuvant chemotherapy  BREAST CANCER HISTORY: From the original intake note:  The patient had screening mammography 02/01/2015 which showed a calcifications in the upper outer quadrant of the left breast. On 02/08/2015 she underwent left diagnostic mammography with tomosynthesis and left breast ultrasonography at the breast Center. The breast density was category C. Mammography confirmed a group of pleomorphic calcifications spanning 4.2 cm maximally. Physical examination of the upper outer quadrant of the left breast showed an area of thickening at the approximate 2:00 position. Ultrasound of this area showed no discrete masses. There was vague shadowing present. Calcifications could not be clearly identified sonographically. There was no lymphadenopathy in the left axilla.  On the same day the patient underwent biopsy of the left breast area in question, with the pathology ([id number]) showing ductal carcinoma in situ, grade 2 or 3, with possible areas of microinvasion, the cancer cells being strongly estrogen and progesterone receptor positive, both at 100%.  On 02/15/2015 the patient underwent bilateral breast MRI. This showed the area of malignancy in the upper outer quadrant to  measure 4.5 cm, including a large area of non-masslike enhancement and a 1 cm spiculated mass. There was also an indeterminate oval mass in the central left breast and another indeterminant mass in the slightly outer right breast anteriorly. Biopsy of the right breast mass 02/18/2015 ([id number]) showed a fibroadenoma, with no evidence of malignancy. Biopsy of 2 additional areas of the left breast (at 10:00 and 2:00) showed a fibroadenoma in the upper inner quadrant, and pseudo-angiomatous stromal hyperplasia (PA SH) at the 2:00 area of the left breast.  The patient's subsequent history is as detailed below.  INTERVAL HISTORY: Cheryl [name] returns today for follow-up of her triple negative invasive breast cancer. Today is day 8 cycle 3 of 4 planned cycles of cyclophosphamide and doxorubicin given every 14 days, with Neulasta support, to be followed by paclitaxel weekly 12. The results of the urinalysis and urine culture last week were negative for infection. She did have some blood in the urine that could signal a kidney stone. In any case, after increasing her water intake the pain resolve on its own.   REVIEW OF SYSTEMS: Kinda denies fevers, chills, nausea, vomiting, or changes in bowel or bladder habits. In fact, her only complaint this week is increased fatigue. [name] eating and drinking well. Every now and then she has a cold sore to the corner of her mouth that resolves after applying carmex. She denies mother mouth sores, rashes, or neuropathy symptoms. A detailed review of systems is otherwise stable.   PAST MEDICAL HISTORY: Past Medical History  Diagnosis Date  . Wears contact lenses   . Breast cancer     left breast    PAST SURGICAL HISTORY: Past Surgical History  Procedure Laterality Date  . Breast lumpectomy with needle localization and axillary sentinel  lymph node bx Left 03/27/2015    Procedure: LEFT BREAST LUMPECTOMY WITH BRACKETED NEEDLE LOCALIZATION AND LEFT  AXILLARY SENTINEL  LYMPH NODE BX;  Surgeon: [name], MD;  Location: [facility];  Service: General;  Laterality: Left;  . Breast surgery    . Re-excision of breast lumpectomy Left 04/05/2015    Procedure: RE-EXCISION LEFT  BREAST LUMPECTOMY;  Surgeon: [name], MD;  Location: [facility];  Service: General;  Laterality: Left;  . Portacath placement Right 04/25/2015    Procedure: INSERTION PORT-A-CATH;  Surgeon: [name], MD;  Location: [facility];  Service: General;  Laterality: Right;    FAMILY HISTORY Family History  Problem Relation Age of Onset  . Lung cancer Maternal Grandmother 39    non smoker  . Lung cancer Maternal Grandfather 41    non smoker worked in [employer]  . Cancer Paternal Grandfather 35    throat cancer ? smoker  . Cancer Cousin 41    throat cancer ? smoker   the patient's parents are living, in their mid-50s as of March 2016. The patient had no siblings. Both the patient's mother's parents died from lung cancer although they did not smoke. [place] and her maternal grandfather was a Ecologist. There is no history of breast or ovarian cancer in the family to her knowledge  GYNECOLOGIC HISTORY:  No LMP recorded. Patient is not currently having periods (Reason: Chemotherapy). Menarche age 35, first live birth age 3. The patient is GX P3. [name] having regular periods. She uses the Essure device for contraception.  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 Her husband [name] is a Freight forwarder. Their daughter [name] lives in [place] where she works in Press photographer. Daughter Summer is a Research scientist (physical sciences) and lives with the patient.. Daughter [name] also at home is 26 years old.    ADVANCED DIRECTIVES: Not in place   HEALTH MAINTENANCE: History  Substance Use Topics  . Smoking status: Never Smoker   . Smokeless tobacco: Never Used  . Alcohol Use: Yes      Colonoscopy:  PAP:  Bone density:  Lipid panel:  Allergies  Allergen Reactions  . Adhesive [Tape] Other (See Comments)    Skin blisters    Current Outpatient Prescriptions  Medication Sig Dispense Refill  . dexamethasone (DECADRON) 4 MG tablet Take 2 tablets by mouth once a day on the day after chemotherapy and then take 2 tablets two times a day for 2 days. Take with food. 30 tablet 1  . lidocaine-prilocaine (EMLA) cream Apply to affected area once 30 g 3  . ondansetron (ZOFRAN) 8 MG tablet Take 1 tablet (8 mg total) by mouth 2 (two) times daily as needed. Start on the third day after chemotherapy. 30 tablet 1  . ciprofloxacin (CIPRO) 500 MG tablet Take 1 tablet (500 mg total) by mouth 2 (two) times daily. (Patient not taking: Reported on 05/30/2015) 10 tablet 0  . ibuprofen (ADVIL,MOTRIN) 400 MG tablet Take 400 mg by mouth every 6 (six) hours as needed.    Marland Kitchen LORazepam (ATIVAN) 0.5 MG tablet Take 1 tablet (0.5 mg total) by mouth at bedtime. (Patient not taking: Reported on 06/06/2015) 30 tablet 0  . prochlorperazine (COMPAZINE) 10 MG tablet Take 1 tablet (10 mg total) by mouth every 6 (six) hours as needed (Nausea or vomiting). (Patient not taking: Reported on 06/06/2015) 30 tablet 1   No  current facility-administered medications for this visit.    OBJECTIVE: There-appearing white woman who appears stated age 39 Vitals:   06/06/15 1556  BP: 123/72  Pulse: 113  Temp: 98.6 F (37 C)  Resp: 18     Body mass index is 38.78 kg/(m^2).    ECOG FS:0 - Asymptomatic  Sclerae unicteric, pupils round and equal Oropharynx clear and moist-- no thrush or other lesions No cervical or supraclavicular adenopathy Lungs no rales or rhonchi Heart regular rate and rhythm Abd soft, nontender, positive bowel sounds MSK no focal spinal tenderness, no upper extremity lymphedema Neuro: nonfocal, well oriented, appropriate affect Breasts: deferred  Photo 05/23/2015    LAB  RESULTS:  CMP     Component Value Date/Time   NA 139 06/06/2015 1510   K 3.7 06/06/2015 1510   CO2 23 06/06/2015 1510   GLUCOSE 150* 06/06/2015 1510   BUN 11.1 06/06/2015 1510   CREATININE 0.7 06/06/2015 1510   CALCIUM 9.0 06/06/2015 1510   PROT 6.1* 06/06/2015 1510   ALBUMIN 3.3* 06/06/2015 1510   AST 10 06/06/2015 1510   ALT 14 06/06/2015 1510   ALKPHOS 76 06/06/2015 1510   BILITOT 0.44 06/06/2015 1510    INo results found for: SPEP, UPEP  Lab Results  Component Value Date   WBC 2.2* 06/06/2015   NEUTROABS 0.9* 06/06/2015   HGB 9.7* 06/06/2015   HCT 29.1* 06/06/2015   MCV 89.7 06/06/2015   PLT 194 06/06/2015      Chemistry      Component Value Date/Time   NA 139 06/06/2015 1510   K 3.7 06/06/2015 1510   CO2 23 06/06/2015 1510   BUN 11.1 06/06/2015 1510   CREATININE 0.7 06/06/2015 1510      Component Value Date/Time   CALCIUM 9.0 06/06/2015 1510   ALKPHOS 76 06/06/2015 1510   AST 10 06/06/2015 1510   ALT 14 06/06/2015 1510   BILITOT 0.44 06/06/2015 1510       No results found for: LABCA2  No components found for: LABCA125  No results for input(s): INR in the last 168 hours.  Urinalysis    Component Value Date/Time   LABSPEC 1.020 05/30/2015 1502   PHURINE 6.0 05/30/2015 1502   GLUCOSEU Negative 05/30/2015 1502   HGBUR Small 05/30/2015 1502   BILIRUBINUR Negative 05/30/2015 1502   KETONESUR Negative 05/30/2015 1502   PROTEINUR Negative 05/30/2015 1502   UROBILINOGEN 0.2 05/30/2015 1502   NITRITE Negative 05/30/2015 1502   LEUKOCYTESUR Negative 05/30/2015 1502    STUDIES: Ct Head Wo Contrast  05/29/2015   CLINICAL DATA:  LEFT breast cancer. Headaches every day for 1 week. Vertex soft tissue swelling may be more noticeable due to hair loss, but evaluate for skull lesion.  EXAM: CT HEAD WITHOUT CONTRAST  TECHNIQUE: Contiguous axial images were obtained from the base of the skull through the vertex without intravenous contrast.  COMPARISON:  None.   FINDINGS: No evidence for acute infarction, hemorrhage, mass lesion, hydrocephalus, or extra-axial fluid. There is no atrophy or white matter disease. The calvarium is intact. Clear orbits, sinuses, and mastoids.  There is a scalp soft tissue density LEFT paramedian location near the frontal vertex, approximately 1 x 4 cm cross-section. This appears to be some type of benign scalp lesion. Correlate with physical exam. Sebaceous cyst is statistically most likely. Underlying skull is normal.  IMPRESSION: No acute intracranial findings.  No mass lesion is evident.  Soft tissue density LEFT paramedian frontal scalp, likely incidental benign scalp  lesion. No underlying osseous abnormality.   Electronically Signed   By: Staci Righter M.D.   On: 05/29/2015 15:28    ASSESSMENT: 39 y.o. [place], [place] woman status post left breast biopsy 02/08/2015 for ductal carcinoma in situ, grade 2 or 3, strongly estrogen and progesterone receptor positive, with likely areas of microinvasion  (a) biopsy of an area in the left breast upper outer quadrant showed PASH  (b) biopsy of 2 additional questionable areas, one in each breast, showed bilateral fibroadenomas  (1) genetics testing March 2016 through the BreastNext gene panel offered by [employer] showed no mutations in the following 17 genes: ATM, BARD1, BRCA1, BRCA2, BRIP1, CDH1, CHEK2, MRE11A, MUTYH, NBN, NF1, PALB2, PTEN, RAD50, RAD51C, RAD51D, and TP53.  (2) status post left lumpectomy with sentinel lymph node sampling 03/27/2015 for a pT1c pN0, stage IA invasive ductal carcinoma, grade 3, triple negative, with an MIB-1 of 33%  (a) close margins were cleared with subsequent excision 04/05/2015.  (3) Oncotype DX score of 38 predicts a risk of 26% outside the breast recurrence within 10 years if the patient's only systemic treatment is tamoxifen for 5 years  (4) adjuvant chemotherapy started 05/02/2015 consisting of doxorubicin and cyclophosphamide in dose dense  fashion 4, with onpro support, followed by paclitaxel weekly 12.  (5) adjuvant radiation to follow chemotherapy  (5) tamoxifen started 02/20/2015 (neoadjuvantly), discontinued 04/19/2015 so as not to overlap chemotherapy; to be resumed at the completion of radiation  PLAN: Besides fatigue, Cheryl [name] is doing well today. The labs were reviewed in detail and her [place] is down to 0.9, while her hgb is at 9.7. This could play into some of her fatigue.  Cheryl [name] will return in 1 week for her 4th and final cycle of doxorubicin and cyclophosphamide. She understands and agrees with this plan. She knows the goal of treatment in her case is cure. She has been encouraged to call with any issues that might arise before her next visit here.    Laurie Panda, NP   06/06/2015 4:28 PM

## 2015-06-13 ENCOUNTER — Encounter: Payer: Self-pay | Admitting: Nurse Practitioner

## 2015-06-13 ENCOUNTER — Ambulatory Visit: Payer: Federal, State, Local not specified - PPO | Admitting: Nurse Practitioner

## 2015-06-13 ENCOUNTER — Other Ambulatory Visit (HOSPITAL_BASED_OUTPATIENT_CLINIC_OR_DEPARTMENT_OTHER): Payer: Federal, State, Local not specified - PPO

## 2015-06-13 ENCOUNTER — Ambulatory Visit: Payer: Federal, State, Local not specified - PPO

## 2015-06-13 ENCOUNTER — Ambulatory Visit (HOSPITAL_BASED_OUTPATIENT_CLINIC_OR_DEPARTMENT_OTHER): Payer: Federal, State, Local not specified - PPO | Admitting: Nurse Practitioner

## 2015-06-13 ENCOUNTER — Other Ambulatory Visit: Payer: Federal, State, Local not specified - PPO

## 2015-06-13 ENCOUNTER — Ambulatory Visit (HOSPITAL_BASED_OUTPATIENT_CLINIC_OR_DEPARTMENT_OTHER): Payer: Federal, State, Local not specified - PPO

## 2015-06-13 VITALS — BP 124/79 | HR 96 | Temp 98.5°F | Resp 18 | Wt 213.6 lb

## 2015-06-13 DIAGNOSIS — J209 Acute bronchitis, unspecified: Secondary | ICD-10-CM | POA: Insufficient documentation

## 2015-06-13 DIAGNOSIS — C50412 Malignant neoplasm of upper-outer quadrant of left female breast: Secondary | ICD-10-CM

## 2015-06-13 DIAGNOSIS — J40 Bronchitis, not specified as acute or chronic: Secondary | ICD-10-CM

## 2015-06-13 DIAGNOSIS — Z5111 Encounter for antineoplastic chemotherapy: Secondary | ICD-10-CM | POA: Diagnosis not present

## 2015-06-13 DIAGNOSIS — Z5189 Encounter for other specified aftercare: Secondary | ICD-10-CM

## 2015-06-13 LAB — COMPREHENSIVE METABOLIC PANEL (CC13)
ALK PHOS: 77 U/L (ref 40–150)
ALT: 17 U/L (ref 0–55)
AST: 14 U/L (ref 5–34)
Albumin: 3.4 g/dL — ABNORMAL LOW (ref 3.5–5.0)
Anion Gap: 10 mEq/L (ref 3–11)
BUN: 9.6 mg/dL (ref 7.0–26.0)
CALCIUM: 9.5 mg/dL (ref 8.4–10.4)
CO2: 25 mEq/L (ref 22–29)
Chloride: 105 mEq/L (ref 98–109)
Creatinine: 0.8 mg/dL (ref 0.6–1.1)
GLUCOSE: 137 mg/dL (ref 70–140)
POTASSIUM: 4.3 meq/L (ref 3.5–5.1)
Sodium: 140 mEq/L (ref 136–145)
Total Bilirubin: 0.42 mg/dL (ref 0.20–1.20)
Total Protein: 6.4 g/dL (ref 6.4–8.3)

## 2015-06-13 LAB — CBC WITH DIFFERENTIAL/PLATELET
BASO%: 0.6 % (ref 0.0–2.0)
Basophils Absolute: 0.1 10*3/uL (ref 0.0–0.1)
EOS%: 0.1 % (ref 0.0–7.0)
Eosinophils Absolute: 0 10*3/uL (ref 0.0–0.5)
HEMATOCRIT: 28.7 % — AB (ref 34.8–46.6)
HGB: 9.5 g/dL — ABNORMAL LOW (ref 11.6–15.9)
LYMPH%: 10.9 % — ABNORMAL LOW (ref 14.0–49.7)
MCH: 30.2 pg (ref 25.1–34.0)
MCHC: 33 g/dL (ref 31.5–36.0)
MCV: 91.6 fL (ref 79.5–101.0)
MONO#: 0.8 10*3/uL (ref 0.1–0.9)
MONO%: 7 % (ref 0.0–14.0)
NEUT#: 9.1 10*3/uL — ABNORMAL HIGH (ref 1.5–6.5)
NEUT%: 81.4 % — ABNORMAL HIGH (ref 38.4–76.8)
Platelets: 206 10*3/uL (ref 145–400)
RBC: 3.13 10*6/uL — ABNORMAL LOW (ref 3.70–5.45)
RDW: 15.7 % — AB (ref 11.2–14.5)
WBC: 11.2 10*3/uL — ABNORMAL HIGH (ref 3.9–10.3)
lymph#: 1.2 10*3/uL (ref 0.9–3.3)

## 2015-06-13 MED ORDER — HYDROCODONE-HOMATROPINE 5-1.5 MG/5ML PO SYRP: 5.0000 mL | ORAL_SOLUTION | Freq: Four times a day (QID) | ORAL | Status: DC | PRN

## 2015-06-13 MED ORDER — SODIUM CHLORIDE 0.9 % IV SOLN
Freq: Once | INTRAVENOUS | Status: AC
  Administered 2015-06-13: 15:00:00 via INTRAVENOUS
  Filled 2015-06-13: qty 5

## 2015-06-13 MED ORDER — DOXORUBICIN HCL CHEMO IV INJECTION 2 MG/ML
60.0000 mg/m2 | Freq: Once | INTRAVENOUS | Status: AC
  Administered 2015-06-13: 124 mg via INTRAVENOUS
  Filled 2015-06-13: qty 62

## 2015-06-13 MED ORDER — PALONOSETRON HCL INJECTION 0.25 MG/5ML
INTRAVENOUS | Status: AC
  Filled 2015-06-13: qty 5

## 2015-06-13 MED ORDER — SODIUM CHLORIDE 0.9 % IJ SOLN
10.0000 mL | INTRAMUSCULAR | Status: DC | PRN
  Administered 2015-06-13: 10 mL
  Filled 2015-06-13: qty 10

## 2015-06-13 MED ORDER — HEPARIN SOD (PORK) LOCK FLUSH 100 UNIT/ML IV SOLN
500.0000 [IU] | Freq: Once | INTRAVENOUS | Status: AC | PRN
  Administered 2015-06-13: 500 [IU]
  Filled 2015-06-13: qty 5

## 2015-06-13 MED ORDER — AZITHROMYCIN 250 MG PO TABS: ORAL_TABLET | ORAL | Status: DC

## 2015-06-13 MED ORDER — SODIUM CHLORIDE 0.9 % IV SOLN
Freq: Once | INTRAVENOUS | Status: AC
  Administered 2015-06-13: 15:00:00 via INTRAVENOUS

## 2015-06-13 MED ORDER — PEGFILGRASTIM 6 MG/0.6ML ~~LOC~~ PSKT
6.0000 mg | PREFILLED_SYRINGE | Freq: Once | SUBCUTANEOUS | Status: AC
  Administered 2015-06-13: 6 mg via SUBCUTANEOUS
  Filled 2015-06-13: qty 0.6

## 2015-06-13 MED ORDER — SODIUM CHLORIDE 0.9 % IV SOLN
600.0000 mg/m2 | Freq: Once | INTRAVENOUS | Status: AC
  Administered 2015-06-13: 1240 mg via INTRAVENOUS
  Filled 2015-06-13: qty 62

## 2015-06-13 MED ORDER — PALONOSETRON HCL INJECTION 0.25 MG/5ML
0.2500 mg | Freq: Once | INTRAVENOUS | Status: AC
Start: 2015-06-13 — End: 2015-06-13
  Administered 2015-06-13: 0.25 mg via INTRAVENOUS

## 2015-06-13 NOTE — Patient Instructions (Signed)
[facility] Discharge Instructions for Patients Receiving Chemotherapy  Today you received the following chemotherapy agents Adriamycin/Cytoxan.  To help prevent nausea and vomiting after your treatment, we encourage you to take your nausea medication as prescribed.    If you develop nausea and vomiting that is not controlled by your nausea medication, call the clinic.   BELOW ARE SYMPTOMS THAT SHOULD BE REPORTED IMMEDIATELY:  *FEVER GREATER THAN 100.5 F  *CHILLS WITH OR WITHOUT FEVER  NAUSEA AND VOMITING THAT IS NOT CONTROLLED WITH YOUR NAUSEA MEDICATION  *UNUSUAL SHORTNESS OF BREATH  *UNUSUAL BRUISING OR BLEEDING  TENDERNESS IN MOUTH AND THROAT WITH OR WITHOUT PRESENCE OF ULCERS  *URINARY PROBLEMS  *BOWEL PROBLEMS  UNUSUAL RASH Items with * indicate a potential emergency and should be followed up as soon as possible.  Feel free to call the clinic you have any questions or concerns. The clinic phone number is [phone].  Please show the CHEMO ALERT CARD at check-in to the Emergency Department and triage nurse.   

## 2015-06-13 NOTE — Progress Notes (Signed)
[facility]  [phone] [phone]     ID: [name] DOB: 1976-10-11  MR#: [id number]  STM#:[id number]  Patient Care Team: No Pcp Per Patient as PCP - General (General Practice) [name], MD as Consulting Physician (Obstetrics and Gynecology) [name], MD as Consulting Physician (General Surgery) [name], MD as Consulting Physician (Oncology) [name], MD as Consulting Physician (Radiation Oncology) [name], RN as Registered Nurse [name], RN as Registered Nurse [name], NP as Nurse Practitioner (Nurse Practitioner) PCP: No PCP Per Patient OTHER MD:  CHIEF COMPLAINT: Left breast cancer  CURRENT TREATMENT: Adjuvant chemotherapy  BREAST CANCER HISTORY: From the original intake note:  The patient had screening mammography 02/01/2015 which showed a calcifications in the upper outer quadrant of the left breast. On 02/08/2015 she underwent left diagnostic mammography with tomosynthesis and left breast ultrasonography at the [facility] Center. The breast density was category C. Mammography confirmed a group of pleomorphic calcifications spanning 4.2 cm maximally. Physical examination of the upper outer quadrant of the left breast showed an area of thickening at the approximate 2:00 position. Ultrasound of this area showed no discrete masses. There was vague shadowing present. Calcifications could not be clearly identified sonographically. There was no lymphadenopathy in the left axilla.  On the same day the patient underwent biopsy of the left breast area in question, with the pathology ([facility] [id number]) showing ductal carcinoma in situ, grade 2 or 3, with possible areas of microinvasion, the cancer cells being strongly estrogen and progesterone receptor positive, both at 100%.  On 02/15/2015 the patient underwent bilateral breast MRI. This showed the area of malignancy in the upper outer quadrant to  measure 4.5 cm, including a large area of non-masslike enhancement and a 1 cm spiculated mass. There was also an indeterminate oval mass in the central left breast and another indeterminant mass in the slightly outer right breast anteriorly. Biopsy of the right breast mass 02/18/2015 ([id number]) showed a fibroadenoma, with no evidence of malignancy. Biopsy of 2 additional areas of the left breast (at 10:00 and 2:00) showed a fibroadenoma in the upper inner quadrant, and pseudo-angiomatous stromal hyperplasia (PA SH) at the 2:00 area of the left breast.  The patient's subsequent history is as detailed below.  INTERVAL HISTORY: Teyah returns today for follow-up of her triple negative invasive breast cancer. Today is day 1 cycle 4 of 4 planned cycles of cyclophosphamide and doxorubicin given every 14 days, with Neulasta support, to be followed by paclitaxel weekly 12. Her cough continues. While she was incidentally on cipro a few weeks back for neutropenia, she thought her cough improved, but this was shortlived after her antibiotics stopped. The cough is strong and unproductive, but she denies shortness of breath. Its to the point now where her back and ribs ache coughing so much. It is keeping her from sleeping. Mucinex and robitussin have not helped.  REVIEW OF SYSTEMS: Raynetta denies fevers, chills, nausea, vomiting, or changes in bowel or bladder habits. [name] managing her fatigue as best she can. Her appetite is healthy, and she is drinking well.  She denies mouth sores, rashes, or neuropathy symptoms. She has no headaches, dizziness, vision changes, or weakness. A detailed review of systems is otherwise stable.   PAST MEDICAL HISTORY: Past Medical History  Diagnosis Date  . Wears contact lenses   . Breast cancer     left breast    PAST SURGICAL HISTORY: Past Surgical History  Procedure Laterality Date  . Breast lumpectomy with needle localization and axillary sentinel lymph node bx  Left 03/27/2015    Procedure: LEFT BREAST LUMPECTOMY WITH BRACKETED NEEDLE LOCALIZATION AND LEFT  AXILLARY SENTINEL LYMPH NODE BX;  Surgeon: [name], MD;  Location: [facility];  Service: General;  Laterality: Left;  . Breast surgery    . Re-excision of breast lumpectomy Left 04/05/2015    Procedure: RE-EXCISION LEFT  BREAST LUMPECTOMY;  Surgeon: [name], MD;  Location: [facility];  Service: General;  Laterality: Left;  . Portacath placement Right 04/25/2015    Procedure: INSERTION PORT-A-CATH;  Surgeon: [name], MD;  Location: [facility];  Service: General;  Laterality: Right;    FAMILY HISTORY Family History  Problem Relation Age of Onset  . Lung cancer Maternal Grandmother 11    non smoker  . Lung cancer Maternal Grandfather 51    non smoker worked in [employer]  . Cancer Paternal Grandfather 35    throat cancer ? smoker  . Cancer Cousin 41    throat cancer ? smoker   the patient's parents are living, in their mid-50s as of March 2016. The patient had no siblings. Both the patient's mother's parents died from lung cancer although they did not smoke. [place] and her maternal grandfather was a Ecologist. There is no history of breast or ovarian cancer in the family to her knowledge  GYNECOLOGIC HISTORY:  No LMP recorded. Patient is not currently having periods (Reason: Chemotherapy). Menarche age 39, first live birth age 39. The patient is GX P3. [name] having regular periods. She uses the Essure device for contraception.  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 Her husband [name] is a Freight forwarder. Their daughter [name] lives in [place] where she works in Press photographer. Daughter Summer is a Research scientist (physical sciences) and lives with the patient.. Daughter [name] also at home is 3 years old.    ADVANCED DIRECTIVES: Not in place   HEALTH MAINTENANCE: History  Substance Use  Topics  . Smoking status: Never Smoker   . Smokeless tobacco: Never Used  . Alcohol Use: Yes     Colonoscopy:  PAP:  Bone density:  Lipid panel:  Allergies  Allergen Reactions  . Adhesive [Tape] Other (See Comments)    Skin blisters    Current Outpatient Prescriptions  Medication Sig Dispense Refill  . dexamethasone (DECADRON) 4 MG tablet Take 2 tablets by mouth once a day on the day after chemotherapy and then take 2 tablets two times a day for 2 days. Take with food. 30 tablet 1  . lidocaine-prilocaine (EMLA) cream Apply to affected area once 30 g 3  . ondansetron (ZOFRAN) 8 MG tablet Take 1 tablet (8 mg total) by mouth 2 (two) times daily as needed. Start on the third day after chemotherapy. 30 tablet 1  . azithromycin (ZITHROMAX) 250 MG tablet Take 2 tabs today, then take 1 tab daily for 4 days. 6 each 0  . HYDROcodone-homatropine (HYCODAN) 5-1.5 MG/5ML syrup Take 5 mLs by mouth every 6 (six) hours as needed for cough. 120 mL 0  . ibuprofen (ADVIL,MOTRIN) 400 MG tablet Take 400 mg by mouth every 6 (six) hours as needed.    Marland Kitchen LORazepam (ATIVAN) 0.5 MG tablet Take 1 tablet (0.5 mg total) by mouth at bedtime. (Patient not taking: Reported on 06/06/2015) 30 tablet 0  . oxyCODONE-acetaminophen (PERCOCET/ROXICET) 5-325 MG  per tablet     . prochlorperazine (COMPAZINE) 10 MG tablet Take 1 tablet (10 mg total) by mouth every 6 (six) hours as needed (Nausea or vomiting). (Patient not taking: Reported on 06/06/2015) 30 tablet 1   No current facility-administered medications for this visit.   Facility-Administered Medications Ordered in Other Visits  Medication Dose Route Frequency Provider Last Rate Last Dose  . sodium chloride 0.9 % injection 10 mL  10 mL Intracatheter PRN [name], MD   10 mL at 06/13/15 1635    OBJECTIVE: There-appearing white woman who appears stated age 33 Vitals:   06/13/15 1340  BP: 124/79  Pulse: 96  Temp: 98.5 F (36.9 C)  Resp: 18     Body  mass index is 39.06 kg/(m^2).    ECOG FS:0 - Asymptomatic  Skin: warm, dry  HEENT: sclerae anicteric, conjunctivae pink, oropharynx clear. No thrush or mucositis.  Lymph Nodes: No cervical or supraclavicular lymphadenopathy  Lungs: clear to auscultation bilaterally, no rales, wheezes, or rhonci. Strong dry cough Heart: regular rate and rhythm  Abdomen: round, soft, non tender, positive bowel sounds  Musculoskeletal: No focal spinal tenderness, no peripheral edema  Neuro: non focal, well oriented, positive affect  Breasts: deferred  LAB RESULTS:  CMP     Component Value Date/Time   NA 140 06/13/2015 1336   K 4.3 06/13/2015 1336   CO2 25 06/13/2015 1336   GLUCOSE 137 06/13/2015 1336   BUN 9.6 06/13/2015 1336   CREATININE 0.8 06/13/2015 1336   CALCIUM 9.5 06/13/2015 1336   PROT 6.4 06/13/2015 1336   ALBUMIN 3.4* 06/13/2015 1336   AST 14 06/13/2015 1336   ALT 17 06/13/2015 1336   ALKPHOS 77 06/13/2015 1336   BILITOT 0.42 06/13/2015 1336    INo results found for: SPEP, UPEP  Lab Results  Component Value Date   WBC 11.2* 06/13/2015   NEUTROABS 9.1* 06/13/2015   HGB 9.5* 06/13/2015   HCT 28.7* 06/13/2015   MCV 91.6 06/13/2015   PLT 206 06/13/2015      Chemistry      Component Value Date/Time   NA 140 06/13/2015 1336   K 4.3 06/13/2015 1336   CO2 25 06/13/2015 1336   BUN 9.6 06/13/2015 1336   CREATININE 0.8 06/13/2015 1336      Component Value Date/Time   CALCIUM 9.5 06/13/2015 1336   ALKPHOS 77 06/13/2015 1336   AST 14 06/13/2015 1336   ALT 17 06/13/2015 1336   BILITOT 0.42 06/13/2015 1336       No results found for: LABCA2  No components found for: LKGMW102  No results for input(s): INR in the last 168 hours.  Urinalysis    Component Value Date/Time   LABSPEC 1.020 05/30/2015 1502   PHURINE 6.0 05/30/2015 1502   GLUCOSEU Negative 05/30/2015 1502   HGBUR Small 05/30/2015 1502   BILIRUBINUR Negative 05/30/2015 1502   KETONESUR Negative 05/30/2015  1502   PROTEINUR Negative 05/30/2015 1502   UROBILINOGEN 0.2 05/30/2015 1502   NITRITE Negative 05/30/2015 1502   LEUKOCYTESUR Negative 05/30/2015 1502    STUDIES: Ct Head Wo Contrast  05/29/2015   CLINICAL DATA:  LEFT breast cancer. Headaches every day for 1 week. Vertex soft tissue swelling may be more noticeable due to hair loss, but evaluate for skull lesion.  EXAM: CT HEAD WITHOUT CONTRAST  TECHNIQUE: Contiguous axial images were obtained from the base of the skull through the vertex without intravenous contrast.  COMPARISON:  None.  FINDINGS: No  evidence for acute infarction, hemorrhage, mass lesion, hydrocephalus, or extra-axial fluid. There is no atrophy or white matter disease. The calvarium is intact. Clear orbits, sinuses, and mastoids.  There is a scalp soft tissue density LEFT paramedian location near the frontal vertex, approximately 1 x 4 cm cross-section. This appears to be some type of benign scalp lesion. Correlate with physical exam. Sebaceous cyst is statistically most likely. Underlying skull is normal.  IMPRESSION: No acute intracranial findings.  No mass lesion is evident.  Soft tissue density LEFT paramedian frontal scalp, likely incidental benign scalp lesion. No underlying osseous abnormality.   Electronically Signed   By: Staci Righter M.D.   On: 05/29/2015 15:28    ASSESSMENT: 39 y.o. [place], [place] woman status post left breast biopsy 02/08/2015 for ductal carcinoma in situ, grade 2 or 3, strongly estrogen and progesterone receptor positive, with likely areas of microinvasion  (a) biopsy of an area in the left breast upper outer quadrant showed PASH  (b) biopsy of 2 additional questionable areas, one in each breast, showed bilateral fibroadenomas  (1) genetics testing March 2016 through the BreastNext gene panel offered by [employer] showed no mutations in the following 17 genes: ATM, BARD1, BRCA1, BRCA2, BRIP1, CDH1, CHEK2, MRE11A, MUTYH, NBN, NF1, PALB2, PTEN, RAD50,  RAD51C, RAD51D, and TP53.  (2) status post left lumpectomy with sentinel lymph node sampling 03/27/2015 for a pT1c pN0, stage IA invasive ductal carcinoma, grade 3, triple negative, with an MIB-1 of 33%  (a) close margins were cleared with subsequent excision 04/05/2015.  (3) Oncotype DX score of 38 predicts a risk of 26% outside the breast recurrence within 10 years if the patient's only systemic treatment is tamoxifen for 5 years  (4) adjuvant chemotherapy started 05/02/2015 consisting of doxorubicin and cyclophosphamide in dose dense fashion 4, with onpro support, followed by paclitaxel weekly 12.  (5) adjuvant radiation to follow chemotherapy  (5) tamoxifen started 02/20/2015 (neoadjuvantly), discontinued 04/19/2015 so as not to overlap chemotherapy; to be resumed at the completion of radiation  PLAN: Ayomide continues to tolerate treatment well. The labs were reviewed in detail and were stable. She will proceed with her 4th and final dose of doxorubicin and cyclophosphamide as planned today.   Her cough continues on its 4th week now. We will try a zpak and hycodan syrup now that mucinex and robitussin have proved ineffective.   Adelai will return in 1 week for labs and a nadir visit with Dr. Jana Hakim. At this time they will discuss her next chemo regimen, paclitaxel weekly x12. She understands and agrees with this plan. She knows the goal of treatment in her case is cure. She has been encouraged to call with any issues that might arise before her next visit here.    Laurie Panda, NP   06/13/2015 4:42 PM

## 2015-06-13 NOTE — Progress Notes (Signed)
Adriamycin administered through a free dripping normal saline line. Positive blood return noted before every 5 mL's during and after adriamycin administration. Patient tolerated well.

## 2015-06-14 ENCOUNTER — Encounter: Payer: Self-pay | Admitting: *Deleted

## 2015-06-19 ENCOUNTER — Other Ambulatory Visit (HOSPITAL_BASED_OUTPATIENT_CLINIC_OR_DEPARTMENT_OTHER): Payer: Federal, State, Local not specified - PPO

## 2015-06-19 ENCOUNTER — Telehealth: Payer: Self-pay | Admitting: Oncology

## 2015-06-19 ENCOUNTER — Ambulatory Visit (HOSPITAL_BASED_OUTPATIENT_CLINIC_OR_DEPARTMENT_OTHER): Payer: Federal, State, Local not specified - PPO | Admitting: Oncology

## 2015-06-19 VITALS — BP 125/72 | HR 113 | Temp 98.9°F | Resp 18 | Ht 62.0 in | Wt 209.4 lb

## 2015-06-19 DIAGNOSIS — Z170 Estrogen receptor positive status [ER+]: Secondary | ICD-10-CM | POA: Diagnosis not present

## 2015-06-19 DIAGNOSIS — C50412 Malignant neoplasm of upper-outer quadrant of left female breast: Secondary | ICD-10-CM | POA: Diagnosis not present

## 2015-06-19 LAB — COMPREHENSIVE METABOLIC PANEL (CC13)
ALBUMIN: 3.4 g/dL — AB (ref 3.5–5.0)
ALK PHOS: 84 U/L (ref 40–150)
ALT: 18 U/L (ref 0–55)
AST: 10 U/L (ref 5–34)
Anion Gap: 9 mEq/L (ref 3–11)
BUN: 11.5 mg/dL (ref 7.0–26.0)
CALCIUM: 9 mg/dL (ref 8.4–10.4)
CO2: 26 mEq/L (ref 22–29)
Chloride: 104 mEq/L (ref 98–109)
Creatinine: 0.7 mg/dL (ref 0.6–1.1)
Glucose: 154 mg/dl — ABNORMAL HIGH (ref 70–140)
Potassium: 4.6 mEq/L (ref 3.5–5.1)
Sodium: 139 mEq/L (ref 136–145)
Total Bilirubin: 0.77 mg/dL (ref 0.20–1.20)
Total Protein: 6.2 g/dL — ABNORMAL LOW (ref 6.4–8.3)

## 2015-06-19 LAB — CBC WITH DIFFERENTIAL/PLATELET
BASO%: 0.8 % (ref 0.0–2.0)
BASOS ABS: 0 10*3/uL (ref 0.0–0.1)
EOS ABS: 0 10*3/uL (ref 0.0–0.5)
EOS%: 0.8 % (ref 0.0–7.0)
HEMATOCRIT: 30 % — AB (ref 34.8–46.6)
HEMOGLOBIN: 10 g/dL — AB (ref 11.6–15.9)
LYMPH#: 0.4 10*3/uL — AB (ref 0.9–3.3)
LYMPH%: 33.1 % (ref 14.0–49.7)
MCH: 30.4 pg (ref 25.1–34.0)
MCHC: 33.3 g/dL (ref 31.5–36.0)
MCV: 91.2 fL (ref 79.5–101.0)
MONO#: 0 10*3/uL — ABNORMAL LOW (ref 0.1–0.9)
MONO%: 3.1 % (ref 0.0–14.0)
NEUT%: 62.2 % (ref 38.4–76.8)
NEUTROS ABS: 0.8 10*3/uL — AB (ref 1.5–6.5)
Platelets: 169 10*3/uL (ref 145–400)
RBC: 3.29 10*6/uL — AB (ref 3.70–5.45)
RDW: 15.8 % — ABNORMAL HIGH (ref 11.2–14.5)
WBC: 1.3 10*3/uL — ABNORMAL LOW (ref 3.9–10.3)

## 2015-06-19 MED ORDER — PROCHLORPERAZINE MALEATE 10 MG PO TABS: 10.0000 mg | ORAL_TABLET | Freq: Four times a day (QID) | ORAL | Status: DC | PRN

## 2015-06-19 MED ORDER — ONDANSETRON HCL 8 MG PO TABS: 8.0000 mg | ORAL_TABLET | Freq: Two times a day (BID) | ORAL | Status: DC

## 2015-06-19 NOTE — Progress Notes (Signed)
[facility]  [phone] [phone]     ID: Cheryl [name] DOB: 09-17-1976  MR#: [id number]  YTK#:[id number]  Patient Care Team: No Pcp Per Patient as PCP - General (General Practice) [name], MD as Consulting Physician (Obstetrics and Gynecology) [name], MD as Consulting Physician (General Surgery) [name], MD as Consulting Physician (Oncology) [name], MD as Consulting Physician (Radiation Oncology) [name], RN as Registered Nurse [name], RN as Registered Nurse [name], NP as Nurse Practitioner (Nurse Practitioner) PCP: No PCP Per Patient OTHER MD:  CHIEF COMPLAINT: Left breast cancer  CURRENT TREATMENT: Adjuvant chemotherapy  BREAST CANCER HISTORY: From the original intake note:  The patient had screening mammography 02/01/2015 which showed a calcifications in the upper outer quadrant of the left breast. On 02/08/2015 she underwent left diagnostic mammography with tomosynthesis and left breast ultrasonography at the breast Center. The breast density was category C. Mammography confirmed a group of pleomorphic calcifications spanning 4.2 cm maximally. Physical examination of the upper outer quadrant of the left breast showed an area of thickening at the approximate 2:00 position. Ultrasound of this area showed no discrete masses. There was vague shadowing present. Calcifications could not be clearly identified sonographically. There was no lymphadenopathy in the left axilla.  On the same day the patient underwent biopsy of the left breast area in question, with the pathology (S [id number]) showing ductal carcinoma in situ, grade 2 or 3, with possible areas of microinvasion, the cancer cells being strongly estrogen and progesterone receptor positive, both at 100%.  On 02/15/2015 the patient underwent bilateral breast MRI. This showed the area of malignancy in the upper outer quadrant to  measure 4.5 cm, including a large area of non-masslike enhancement and a 1 cm spiculated mass. There was also an indeterminate oval mass in the central left breast and another indeterminant mass in the slightly outer right breast anteriorly. Biopsy of the right breast mass 02/18/2015 ([id number]) showed a fibroadenoma, with no evidence of malignancy. Biopsy of 2 additional areas of the left breast (at 10:00 and 2:00) showed a fibroadenoma in the upper inner quadrant, and pseudo-angiomatous stromal hyperplasia (PA SH) at the 2:00 area of the left breast.  The patient's subsequent history is as detailed below.  INTERVAL HISTORY: Cheryl [name] returns today for follow-up of her triple negative invasive breast cancer. Today is day 8 cycle 4 of 4 planned cycles of cyclophosphamide and doxorubicin given every 14 days, with Neulasta support. [name] now ready to start her weekly paclitaxel treatments  REVIEW OF SYSTEMS: She did remarkably well with the first part of her chemotherapy. She would be treated on Thursday, go back to work and Friday, at least a half a day, then be back to work on Monday, at least a half a day. She did have some fatigue and she "slept a lot". Despite that she still was able to take some walks. She had no significant problems with nausea or vomiting, had some angular cheilitis but no mouth sores, and no hot flashes or vaginal dryness problems. Her periods did stop with chemotherapy. She was having regular periods before the start of treatment. Her family also did generally well. Her older daughter, 24, was a bit concerned about her but now that the patient's mother is around to help she has been more relaxed. A detailed review of systems today was otherwise noncontributory  PAST MEDICAL HISTORY: Past Medical History  Diagnosis Date  . Wears  contact lenses   . Breast cancer     left breast    PAST SURGICAL HISTORY: Past Surgical History  Procedure Laterality Date  . Breast  lumpectomy with needle localization and axillary sentinel lymph node bx Left 03/27/2015    Procedure: LEFT BREAST LUMPECTOMY WITH BRACKETED NEEDLE LOCALIZATION AND LEFT  AXILLARY SENTINEL LYMPH NODE BX;  Surgeon: [name], MD;  Location: [facility];  Service: General;  Laterality: Left;  . Breast surgery    . Re-excision of breast lumpectomy Left 04/05/2015    Procedure: RE-EXCISION LEFT  BREAST LUMPECTOMY;  Surgeon: [name], MD;  Location: [facility];  Service: General;  Laterality: Left;  . Portacath placement Right 04/25/2015    Procedure: INSERTION PORT-A-CATH;  Surgeon: [name], MD;  Location: [facility];  Service: General;  Laterality: Right;    FAMILY HISTORY Family History  Problem Relation Age of Onset  . Lung cancer Maternal Grandmother 50    non smoker  . Lung cancer Maternal Grandfather 42    non smoker worked in [employer]  . Cancer Paternal Grandfather 35    throat cancer ? smoker  . Cancer Cousin 41    throat cancer ? smoker   the patient's parents are living, in their mid-50s as of March 2016. The patient had no siblings. Both the patient's mother's parents died from lung cancer although they did not smoke. [place] and her maternal grandfather was a Ecologist. There is no history of breast or ovarian cancer in the family to her knowledge  GYNECOLOGIC HISTORY:  No LMP recorded. Patient is not currently having periods (Reason: Chemotherapy). Menarche age 28, first live birth age 78. The patient is GX P3. [name] having regular periods. She uses the Essure device for contraception.  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 Her husband [name] is a Freight forwarder. Their daughter [name] lives in [place] where she works in Press photographer. Daughter Summer is a Research scientist (physical sciences) and lives with the patient.. Daughter [name] also at home is 71 years old.    ADVANCED  DIRECTIVES: Not in place   HEALTH MAINTENANCE: History  Substance Use Topics  . Smoking status: Never Smoker   . Smokeless tobacco: Never Used  . Alcohol Use: Yes     Colonoscopy:  PAP:  Bone density:  Lipid panel:  Allergies  Allergen Reactions  . Adhesive [Tape] Other (See Comments)    Skin blisters    Current Outpatient Prescriptions  Medication Sig Dispense Refill  . ibuprofen (ADVIL,MOTRIN) 400 MG tablet Take 400 mg by mouth every 6 (six) hours as needed.    . lidocaine-prilocaine (EMLA) cream Apply to affected area once 30 g 3  . ondansetron (ZOFRAN) 8 MG tablet Take 1 tablet (8 mg total) by mouth 2 (two) times daily as needed. Start on the third day after chemotherapy. 30 tablet 1  . prochlorperazine (COMPAZINE) 10 MG tablet Take 1 tablet (10 mg total) by mouth every 6 (six) hours as needed (Nausea or vomiting). (Patient not taking: Reported on 06/06/2015) 30 tablet 1   No current facility-administered medications for this visit.    OBJECTIVE: There-appearing white woman in no acute distress Filed Vitals:   06/19/15 0827  BP: 125/72  Pulse: 113  Temp: 98.9 F (37.2 C)  Resp: 18     Body mass index is 38.29 kg/(m^2).    ECOG FS:0 - Asymptomatic  Sclerae unicteric, pupils round and equal Oropharynx clear and moist-- no thrush or other lesions No cervical or supraclavicular adenopathy Lungs no rales or rhonchi Heart regular rate and rhythm Abd soft, nontender, positive bowel sounds MSK no focal spinal tenderness, no upper extremity lymphedema Neuro: nonfocal, well oriented, appropriate affect Breasts: Deferred    LAB RESULTS:  CMP     Component Value Date/Time   NA 140 06/13/2015 1336   K 4.3 06/13/2015 1336   CO2 25 06/13/2015 1336   GLUCOSE 137 06/13/2015 1336   BUN 9.6 06/13/2015 1336   CREATININE 0.8 06/13/2015 1336   CALCIUM 9.5 06/13/2015 1336   PROT 6.4 06/13/2015 1336   ALBUMIN 3.4* 06/13/2015 1336   AST 14 06/13/2015 1336   ALT 17  06/13/2015 1336   ALKPHOS 77 06/13/2015 1336   BILITOT 0.42 06/13/2015 1336    INo results found for: SPEP, UPEP  Lab Results  Component Value Date   WBC 11.2* 06/13/2015   NEUTROABS 9.1* 06/13/2015   HGB 9.5* 06/13/2015   HCT 28.7* 06/13/2015   MCV 91.6 06/13/2015   PLT 206 06/13/2015      Chemistry      Component Value Date/Time   NA 140 06/13/2015 1336   K 4.3 06/13/2015 1336   CO2 25 06/13/2015 1336   BUN 9.6 06/13/2015 1336   CREATININE 0.8 06/13/2015 1336      Component Value Date/Time   CALCIUM 9.5 06/13/2015 1336   ALKPHOS 77 06/13/2015 1336   AST 14 06/13/2015 1336   ALT 17 06/13/2015 1336   BILITOT 0.42 06/13/2015 1336       No results found for: LABCA2  No components found for: [id number]  No results for input(s): INR in the last 168 hours.  Urinalysis    Component Value Date/Time   LABSPEC 1.020 05/30/2015 1502   PHURINE 6.0 05/30/2015 1502   GLUCOSEU Negative 05/30/2015 1502   HGBUR Small 05/30/2015 1502   BILIRUBINUR Negative 05/30/2015 1502   KETONESUR Negative 05/30/2015 1502   PROTEINUR Negative 05/30/2015 1502   UROBILINOGEN 0.2 05/30/2015 1502   NITRITE Negative 05/30/2015 1502   LEUKOCYTESUR Negative 05/30/2015 1502    STUDIES: Ct Head Wo Contrast  05/29/2015   CLINICAL DATA:  LEFT breast cancer. Headaches every day for 1 week. Vertex soft tissue swelling may be more noticeable due to hair loss, but evaluate for skull lesion.  EXAM: CT HEAD WITHOUT CONTRAST  TECHNIQUE: Contiguous axial images were obtained from the base of the skull through the vertex without intravenous contrast.  COMPARISON:  None.  FINDINGS: No evidence for acute infarction, hemorrhage, mass lesion, hydrocephalus, or extra-axial fluid. There is no atrophy or white matter disease. The calvarium is intact. Clear orbits, sinuses, and mastoids.  There is a scalp soft tissue density LEFT paramedian location near the frontal vertex, approximately 1 x 4 cm cross-section. This  appears to be some type of benign scalp lesion. Correlate with physical exam. Sebaceous cyst is statistically most likely. Underlying skull is normal.  IMPRESSION: No acute intracranial findings.  No mass lesion is evident.  Soft tissue density LEFT paramedian frontal scalp, likely incidental benign scalp lesion. No underlying osseous abnormality.   Electronically Signed   By: Staci Righter M.D.   On: 05/29/2015 15:28    ASSESSMENT: 39 y.o. [place], [place] woman status post left breast biopsy 02/08/2015 for ductal carcinoma in situ, grade 2 or 3, strongly estrogen and progesterone receptor positive, with likely areas of microinvasion  (a) biopsy  of an area in the left breast upper outer quadrant showed PASH  (b) biopsy of 2 additional questionable areas, one in each breast, showed bilateral fibroadenomas  (1) genetics testing March 2016 through the BreastNext gene panel offered by [employer] showed no mutations in the following 17 genes: ATM, BARD1, BRCA1, BRCA2, BRIP1, CDH1, CHEK2, MRE11A, MUTYH, NBN, NF1, PALB2, PTEN, RAD50, RAD51C, RAD51D, and TP53.  (2) status post left lumpectomy with sentinel lymph node sampling 03/27/2015 for a pT1c pN0, stage IA invasive ductal carcinoma, grade 3, triple negative, with an MIB-1 of 33%  (a) close margins were cleared with subsequent excision 04/05/2015.  (3) Oncotype DX score of 38 predicts a risk of 26% outside the breast recurrence within 10 years if the patient's only systemic treatment is tamoxifen for 5 years  (4) adjuvant chemotherapy started 05/02/2015 consisting of doxorubicin and cyclophosphamide in dose dense fashion 4, completed 06/13/2015, followed by paclitaxel weekly 12.  (5) adjuvant radiation to follow chemotherapy  (5) tamoxifen started 02/20/2015 (neoadjuvantly), discontinued 04/19/2015 so as not to overlap chemotherapy; to be resumed at the completion of radiation  PLAN: Charlaine has completed her 4 doxorubicin/cyclophosphamide cycles.  She generally did well with those. [name] now ready to start her weekly paclitaxel. She understands the benefit of this in absolute terms is a reduction in the 3% range in the risk of outside the breast recurrence. Although this is a small number it adds up when thousand 7 women are treated.  We discussed the possible toxicities, side effects and complications of weekly paclitaxel. In particular she will not need dexamethasone anti-emetics on days 23 or 4. She will use ondansetron alone, with prochlorperazine as backup. She understands the risk of neuropathy and she will alert [place] if this develops. Of course we will also ask her weekly regarding this.  She would prefer to be treated on Thursday because that is today her husband has off. She would like to be treated early afternoon so she can work Thursday morning. We will try to accommodate those preferences.  Once she completes her chemotherapy she will proceed to radiation after which she will return to tamoxifen. She has a good understanding of this plan. She knows the goal of treatment in her case is cure. [name], MD   06/19/2015 8:47 AM

## 2015-06-19 NOTE — Telephone Encounter (Signed)
Appointments made and avs printed for patient °

## 2015-06-20 ENCOUNTER — Other Ambulatory Visit: Payer: Federal, State, Local not specified - PPO

## 2015-06-20 ENCOUNTER — Ambulatory Visit: Payer: Federal, State, Local not specified - PPO | Admitting: Nurse Practitioner

## 2015-06-21 ENCOUNTER — Encounter: Payer: Self-pay | Admitting: Nurse Practitioner

## 2015-06-21 ENCOUNTER — Other Ambulatory Visit: Payer: Self-pay | Admitting: *Deleted

## 2015-06-21 MED ORDER — FLUCONAZOLE 100 MG PO TABS: 100.0000 mg | ORAL_TABLET | Freq: Every day | ORAL | Status: DC

## 2015-06-21 MED ORDER — MAGIC MOUTHWASH W/LIDOCAINE: 5.0000 mL | Freq: Four times a day (QID) | ORAL | Status: DC | PRN

## 2015-06-21 NOTE — Telephone Encounter (Signed)
Spoke with pt - she states she has 2 sores on side of tongue- no noted thrush " but do have a bad taste in my mouth "-  Per above prescription for MMW will be called to pt's pharmacy.

## 2015-06-24 ENCOUNTER — Encounter: Payer: Self-pay | Admitting: Oncology

## 2015-06-26 ENCOUNTER — Encounter: Payer: Self-pay | Admitting: *Deleted

## 2015-06-26 ENCOUNTER — Other Ambulatory Visit: Payer: Self-pay | Admitting: Nurse Practitioner

## 2015-06-26 ENCOUNTER — Encounter: Payer: Self-pay | Admitting: Nurse Practitioner

## 2015-06-26 ENCOUNTER — Encounter: Payer: Federal, State, Local not specified - PPO | Admitting: Nurse Practitioner

## 2015-06-26 ENCOUNTER — Other Ambulatory Visit: Payer: Self-pay | Admitting: *Deleted

## 2015-06-26 DIAGNOSIS — R05 Cough: Secondary | ICD-10-CM

## 2015-06-26 DIAGNOSIS — R059 Cough, unspecified: Secondary | ICD-10-CM

## 2015-06-26 NOTE — Progress Notes (Signed)
Spoke with patient regarding appointments for this week. Patient is going [place] the beach and wants to start chemo next week, 07/04/15. Also, patient is to come [facility] to see Selena Lesser, NP. Patient has had a cough for approximately four weeks and it won't go away. Nira Conn, NP, ordered a chest xray at [facility] for tomorrow.  Xray is to be done before seeing Cyndee. Patient is aware of that and knows how to get to Radiology. Patient verbalized understanding.

## 2015-06-27 ENCOUNTER — Other Ambulatory Visit: Payer: Federal, State, Local not specified - PPO

## 2015-06-27 ENCOUNTER — Ambulatory Visit: Payer: Federal, State, Local not specified - PPO

## 2015-06-27 ENCOUNTER — Ambulatory Visit (HOSPITAL_BASED_OUTPATIENT_CLINIC_OR_DEPARTMENT_OTHER): Payer: Federal, State, Local not specified - PPO | Admitting: Nurse Practitioner

## 2015-06-27 ENCOUNTER — Other Ambulatory Visit: Payer: Self-pay | Admitting: *Deleted

## 2015-06-27 ENCOUNTER — Ambulatory Visit (HOSPITAL_COMMUNITY)
Admission: RE | Admit: 2015-06-27 | Discharge: 2015-06-27 | Disposition: A | Discharge status: Home / Self Care | Payer: Federal, State, Local not specified - PPO | Source: Ambulatory Visit | Attending: Nurse Practitioner | Admitting: Nurse Practitioner

## 2015-06-27 DIAGNOSIS — J40 Bronchitis, not specified as acute or chronic: Secondary | ICD-10-CM | POA: Diagnosis not present

## 2015-06-27 DIAGNOSIS — C50412 Malignant neoplasm of upper-outer quadrant of left female breast: Secondary | ICD-10-CM | POA: Diagnosis not present

## 2015-06-27 DIAGNOSIS — R918 Other nonspecific abnormal finding of lung field: Secondary | ICD-10-CM | POA: Insufficient documentation

## 2015-06-27 DIAGNOSIS — R05 Cough: Secondary | ICD-10-CM | POA: Diagnosis present

## 2015-06-27 DIAGNOSIS — C50919 Malignant neoplasm of unspecified site of unspecified female breast: Secondary | ICD-10-CM | POA: Diagnosis not present

## 2015-06-27 DIAGNOSIS — R059 Cough, unspecified: Secondary | ICD-10-CM

## 2015-06-27 DIAGNOSIS — Z9221 Personal history of antineoplastic chemotherapy: Secondary | ICD-10-CM | POA: Insufficient documentation

## 2015-06-27 MED ORDER — ALBUTEROL SULFATE HFA 108 (90 BASE) MCG/ACT IN AERS: 1.0000 | INHALATION_SPRAY | Freq: Four times a day (QID) | RESPIRATORY_TRACT | Status: DC | PRN

## 2015-06-27 MED ORDER — METHYLPREDNISOLONE 4 MG PO TBPK: ORAL_TABLET | ORAL | Status: DC

## 2015-06-27 MED ORDER — ALBUTEROL SULFATE (2.5 MG/3ML) 0.083% IN NEBU
INHALATION_SOLUTION | RESPIRATORY_TRACT | Status: AC
Start: 2015-06-27 — End: 2015-06-27
  Filled 2015-06-27: qty 3

## 2015-06-27 MED ORDER — ALBUTEROL SULFATE (2.5 MG/3ML) 0.083% IN NEBU
2.5000 mg | INHALATION_SOLUTION | Freq: Once | RESPIRATORY_TRACT | Status: AC
  Administered 2015-06-27: 2.5 mg via RESPIRATORY_TRACT
  Filled 2015-06-27: qty 3

## 2015-06-27 MED ORDER — LEVOFLOXACIN 500 MG PO TABS: 500.0000 mg | ORAL_TABLET | Freq: Every day | ORAL | Status: DC

## 2015-06-27 NOTE — Telephone Encounter (Signed)
Confirmed with pt that she knows to get CXR before coming to [facility]

## 2015-06-28 ENCOUNTER — Ambulatory Visit: Payer: Federal, State, Local not specified - PPO

## 2015-06-28 ENCOUNTER — Ambulatory Visit: Payer: Federal, State, Local not specified - PPO | Admitting: Nurse Practitioner

## 2015-06-28 ENCOUNTER — Other Ambulatory Visit: Payer: Federal, State, Local not specified - PPO

## 2015-06-28 ENCOUNTER — Encounter: Payer: Self-pay | Admitting: Nurse Practitioner

## 2015-06-28 NOTE — Assessment & Plan Note (Signed)
Patient received cycle 4 of her Adriamycin/Cytoxan chemotherapy on 06/13/2015.  Patient is scheduled to return for labs, follow up visit, and her next cycle of chemotherapy on 07/04/2015.

## 2015-06-28 NOTE — Assessment & Plan Note (Signed)
Patient reports bronchitis symptoms with a non-productive cough for approximately 8 weeks.  She was prescribed a Z-Pak a few weeks ago for her bronchitis symptoms; and states that she did initially feel some better with the anti-biotic treatment.  She has also recently been treated for urinary tract infection with Vicente Males biotics as well.  Patient states that she continues to cough so much that her ribs are sore.  She states that her cough remains nonproductive.  She denies any recent fevers or chills.  She denies any sore throat or other URI symptoms; including headache.  On exam.  Patient's bilateral breath sounds clear; with no wheezing.  Patient was intermittently coughing with a very congested cough.  There was no acute respiratory distress noted.  Vital signs stable; and patient was afebrile.  Chest x-ray obtained today revealed:  Linear parenchymal opacity likely within the right middle lobe. Differential diagnosis includes atelectasis versus perifissural fluid collection.  Increased density of bilateral hilar regions. Query lymphadenopathy or peribronchial thickening.  Patient was given an albuterol nebulizer treatment while at the cancer center.  She states that she felt much better and was breathing easier after the nebulizer treatment.  After reviewing chest x-ray results with the patient; advised patient was prescribed Levaquin Anna biotics, a Medrol dose pack, and an albuterol inhaler.  Advised patient to call/return or go directly to the emergency department for any worsening symptoms whatsoever.  Also, advised patient may very well need to follow up with a pulmonology referral if her bronchitis symptoms do not improve.  All details of today's visit were reviewed with Dr. Benay Spice (on call MD).

## 2015-06-28 NOTE — Progress Notes (Signed)
SYMPTOM MANAGEMENT CLINIC   HPI: Cheryl [name] 39 y.o. female diagnosed with breast cancer.  Just completed cycle 4 of her Adriamycin/Cytoxan chemotherapy; and planning to proceed with Taxol chemotherapy shortly.   Patient reports bronchitis symptoms with a non-productive cough for approximately 8 weeks.  She was prescribed a Z-Pak a few weeks ago for her bronchitis symptoms; and states that she did initially feel some better with the anti-biotic treatment.  She has also recently been treated for urinary tract infection with Vicente Males biotics as well.  Patient states that she continues to cough so much that her ribs are sore.  She states that her cough remains nonproductive.  She denies any recent fevers or chills.  She denies any sore throat or other URI symptoms; including headache.  HPI  ROS  Past Medical History  Diagnosis Date  . Wears contact lenses   . Breast cancer     left breast    Past Surgical History  Procedure Laterality Date  . Breast lumpectomy with needle localization and axillary sentinel lymph node bx Left 03/27/2015    Procedure: LEFT BREAST LUMPECTOMY WITH BRACKETED NEEDLE LOCALIZATION AND LEFT  AXILLARY SENTINEL LYMPH NODE BX;  Surgeon: [name], MD;  Location: [facility];  Service: General;  Laterality: Left;  . Breast surgery    . Re-excision of breast lumpectomy Left 04/05/2015    Procedure: RE-EXCISION LEFT  BREAST LUMPECTOMY;  Surgeon: [name], MD;  Location: [facility];  Service: General;  Laterality: Left;  . Portacath placement Right 04/25/2015    Procedure: INSERTION PORT-A-CATH;  Surgeon: [name], MD;  Location: [facility];  Service: General;  Laterality: Right;    has Breast cancer of upper-outer quadrant of left female breast; UTI (urinary tract infection); Flank pain; and Bronchitis on her problem list.    is allergic to adhesive.    Medication List       This list is  accurate as of: 06/27/15 11:59 PM.  Always use your most recent med list.               albuterol 108 (90 BASE) MCG/ACT inhaler  Commonly known as:  PROVENTIL HFA;VENTOLIN HFA  Inhale 1-2 puffs into the lungs every 6 (six) hours as needed for wheezing or shortness of breath.     HYDROcodone-homatropine 5-1.5 MG/5ML syrup  Commonly known as:  HYCODAN  TAKE 5MLS BY MOUTH EVERY 6 HOURS AS NEEDED FOR COUGH     ibuprofen 400 MG tablet  Commonly known as:  ADVIL,MOTRIN  Take 400 mg by mouth every 6 (six) hours as needed.     levofloxacin 500 MG tablet  Commonly known as:  LEVAQUIN  Take 1 tablet (500 mg total) by mouth daily.     lidocaine-prilocaine cream  Commonly known as:  EMLA  Apply to affected area once     LORazepam 0.5 MG tablet  Commonly known as:  ATIVAN  Take 0.5 mg by mouth at bedtime.     magic mouthwash w/lidocaine Soln  Take 5 mLs by mouth 4 (four) times daily as needed for mouth pain (swish, swallow or spit).     methylPREDNISolone 4 MG Tbpk tablet  Commonly known as:  MEDROL DOSEPAK  Medrol dose pak- take as directed.     ondansetron 8 MG tablet  Commonly known as:  ZOFRAN  Take 1 tablet (8 mg total) by mouth 2 (two) times daily. Start the day after chemo for 2 days. Then  take as needed for nausea or vomiting.     prochlorperazine 10 MG tablet  Commonly known as:  COMPAZINE  Take 1 tablet (10 mg total) by mouth every 6 (six) hours as needed (Nausea or vomiting).         PHYSICAL EXAMINATION  Oncology Vitals 06/27/2015 06/19/2015 06/13/2015 06/06/2015 05/30/2015 05/30/2015 05/23/2015  Height - 158 cm - 158 cm - - 158 cm  Weight - 94.983 kg 96.888 kg 96.208 kg - 97.705 kg 96.888 kg  Weight (lbs) - 209 lbs 6 oz 213 lbs 10 oz 212 lbs 2 oz - 215 lbs 6 oz 213 lbs 10 oz  BMI (kg/m2) - 38.3 kg/m2 - 38.79 kg/m2 - - 39.07 kg/m2  Temp 98.9 98.9 98.5 98.6 98.9 98.7 99  Pulse 103 113 96 113 99 102 72  Resp '24 18 18 18 18 18 18  ' SpO2 - - 100 - 100 100 100  BSA (m2) - 2.04  m2 - 2.05 m2 - - 2.06 m2   BP Readings from Last 3 Encounters:  06/27/15 136/85  06/19/15 125/72  06/13/15 124/79    Physical Exam  Constitutional: [name] oriented to person, place, and time and well-developed, well-nourished, and in no distress.  HENT:  Head: Normocephalic and atraumatic.  Mouth/Throat: Oropharynx is clear and moist.  Eyes: Conjunctivae and EOM are normal. Pupils are equal, round, and reactive to light. Right eye exhibits no discharge. Left eye exhibits no discharge. No scleral icterus.  Neck: Normal range of motion. Neck supple. No JVD present. No tracheal deviation present. No thyromegaly present.  Cardiovascular: Normal rate, regular rhythm, normal heart sounds and intact distal pulses.   Pulmonary/Chest: Effort normal and breath sounds normal. No respiratory distress. She has no wheezes. She has no rales. She exhibits no tenderness.  No wheezing or shortness of breath noted on exam.  However, patient does have a very congested cough.  Abdominal: Soft. Bowel sounds are normal. She exhibits no distension and no mass. There is no tenderness. There is no rebound and no guarding.  Musculoskeletal: Normal range of motion. She exhibits no edema or tenderness.  Lymphadenopathy:    She has no cervical adenopathy.  Neurological: [name] alert and oriented to person, place, and time. Gait normal.  Skin: Skin is warm and dry. No rash noted. No erythema. No pallor.  Psychiatric: Affect normal.  Nursing note and vitals reviewed.   LABORATORY DATA:. No visits with results within 3 Day(s) from this visit. Latest known visit with results is:  Appointment on 06/19/2015  Component Date Value Ref Range Status  . WBC 06/19/2015 1.3* 3.9 - 10.3 10e3/uL Final  . NEUT# 06/19/2015 0.8* 1.5 - 6.5 10e3/uL Final  . HGB 06/19/2015 10.0* 11.6 - 15.9 g/dL Final  . HCT 06/19/2015 30.0* 34.8 - 46.6 % Final  . Platelets 06/19/2015 169  145 - 400 10e3/uL Final  . MCV 06/19/2015 91.2  79.5 -  101.0 fL Final  . MCH 06/19/2015 30.4  25.1 - 34.0 pg Final  . MCHC 06/19/2015 33.3  31.5 - 36.0 g/dL Final  . RBC 06/19/2015 3.29* 3.70 - 5.45 10e6/uL Final  . RDW 06/19/2015 15.8* 11.2 - 14.5 % Final  . lymph# 06/19/2015 0.4* 0.9 - 3.3 10e3/uL Final  . MONO# 06/19/2015 0.0* 0.1 - 0.9 10e3/uL Final  . Eosinophils Absolute 06/19/2015 0.0  0.0 - 0.5 10e3/uL Final  . Basophils Absolute 06/19/2015 0.0  0.0 - 0.1 10e3/uL Final  . NEUT% 06/19/2015 62.2  38.4 -  76.8 % Final  . LYMPH% 06/19/2015 33.1  14.0 - 49.7 % Final  . MONO% 06/19/2015 3.1  0.0 - 14.0 % Final  . EOS% 06/19/2015 0.8  0.0 - 7.0 % Final  . BASO% 06/19/2015 0.8  0.0 - 2.0 % Final  . Sodium 06/19/2015 139  136 - 145 mEq/L Final  . Potassium 06/19/2015 4.6  3.5 - 5.1 mEq/L Final  . Chloride 06/19/2015 104  98 - 109 mEq/L Final  . CO2 06/19/2015 26  22 - 29 mEq/L Final  . Glucose 06/19/2015 154* 70 - 140 mg/dl Final  . BUN 06/19/2015 11.5  7.0 - 26.0 mg/dL Final  . Creatinine 06/19/2015 0.7  0.6 - 1.1 mg/dL Final  . Total Bilirubin 06/19/2015 0.77  0.20 - 1.20 mg/dL Final  . Alkaline Phosphatase 06/19/2015 84  40 - 150 U/L Final  . AST 06/19/2015 10  5 - 34 U/L Final  . ALT 06/19/2015 18  0 - 55 U/L Final  . Total Protein 06/19/2015 6.2* 6.4 - 8.3 g/dL Final  . Albumin 06/19/2015 3.4* 3.5 - 5.0 g/dL Final  . Calcium 06/19/2015 9.0  8.4 - 10.4 mg/dL Final  . Anion Gap 06/19/2015 9  3 - 11 mEq/L Final  . EGFR 06/19/2015 >90  >90 ml/min/1.73 m2 Final   eGFR is calculated using the CKD-EPI Creatinine Equation (2009)     RADIOGRAPHIC STUDIES: Dg Chest 2 View  06/27/2015   CLINICAL DATA:  Chronic cough for 2 months. Patient recently completed four chemotherapy cycles for breast cancer.  EXAM: CHEST  2 VIEW  COMPARISON:  04/25/2015  FINDINGS: Injectable port from right internal jugular approach is seen with tip projecting at the expected location of the cavoatrial junction. The heart size and mediastinal contours are within normal  limits. There is a linear opacity within the right lung at the expected location of the right middle lobe. Its shape suggests atelectasis versus perifissural fluid collection. There is no evidence of pleural effusion or pneumothorax. There is increased density of bilateral hilar regions. The visualized skeletal structures and soft tissues are unremarkable.  IMPRESSION: Linear parenchymal opacity likely within the right middle lobe. Differential diagnosis includes atelectasis versus perifissural fluid collection.  Increased density of bilateral hilar regions. Query lymphadenopathy or peribronchial thickening.   Electronically Signed   By: Fidela Salisbury M.D.   On: 06/27/2015 14:24    ASSESSMENT/PLAN:    Breast cancer of upper-outer quadrant of left female breast Patient received cycle 4 of her Adriamycin/Cytoxan chemotherapy on 06/13/2015.  Patient is scheduled to return for labs, follow up visit, and her next cycle of chemotherapy on 07/04/2015.  Bronchitis Patient reports bronchitis symptoms with a non-productive cough for approximately 8 weeks.  She was prescribed a Z-Pak a few weeks ago for her bronchitis symptoms; and states that she did initially feel some better with the anti-biotic treatment.  She has also recently been treated for urinary tract infection with Vicente Males biotics as well.  Patient states that she continues to cough so much that her ribs are sore.  She states that her cough remains nonproductive.  She denies any recent fevers or chills.  She denies any sore throat or other URI symptoms; including headache.  On exam.  Patient's bilateral breath sounds clear; with no wheezing.  Patient was intermittently coughing with a very congested cough.  There was no acute respiratory distress noted.  Vital signs stable; and patient was afebrile.  Chest x-ray obtained today revealed:  Linear parenchymal opacity  likely within the right middle lobe. Differential diagnosis includes atelectasis  versus perifissural fluid collection.  Increased density of bilateral hilar regions. Query lymphadenopathy or peribronchial thickening.  Patient was given an albuterol nebulizer treatment while at the cancer center.  She states that she felt much better and was breathing easier after the nebulizer treatment.  After reviewing chest x-ray results with the patient; advised patient was prescribed Levaquin Anna biotics, a Medrol dose pack, and an albuterol inhaler.  Advised patient to call/return or go directly to the emergency department for any worsening symptoms whatsoever.  Also, advised patient may very well need to follow up with a pulmonology referral if her bronchitis symptoms do not improve.  All details of today's visit were reviewed with Dr. Benay Spice (on call MD).       Also, advised patient to stay out of the sun and/or wear sunscreen due to increased sun sensitivity with both chemotherapy and antibiotic use.  Patient stated understanding of all instructions; and was in agreement with this plan of care. The patient knows to call the clinic with any problems, questions or concerns.   Review/collaboration with Dr. Virgilio Belling call- regarding all aspects of patient's visit today.   Total time spent with patient was 40 minutes;  with greater than 75 percent of that time spent in face to face counseling regarding patient's symptoms,  and coordination of care and follow up.  Disclaimer: This note was dictated with voice recognition software. Similar sounding words can inadvertently be transcribed and may not be corrected upon review.   Drue Second, NP 06/28/2015

## 2015-07-01 ENCOUNTER — Telehealth: Payer: Self-pay | Admitting: *Deleted

## 2015-07-01 NOTE — Telephone Encounter (Signed)
LM for pt to rtn call to check status since [facility] visit on 8/4.

## 2015-07-04 ENCOUNTER — Telehealth: Payer: Self-pay | Admitting: Nurse Practitioner

## 2015-07-04 ENCOUNTER — Ambulatory Visit (HOSPITAL_BASED_OUTPATIENT_CLINIC_OR_DEPARTMENT_OTHER): Payer: Federal, State, Local not specified - PPO | Admitting: Nurse Practitioner

## 2015-07-04 ENCOUNTER — Ambulatory Visit (HOSPITAL_BASED_OUTPATIENT_CLINIC_OR_DEPARTMENT_OTHER): Payer: Federal, State, Local not specified - PPO

## 2015-07-04 ENCOUNTER — Encounter: Payer: Self-pay | Admitting: Nurse Practitioner

## 2015-07-04 ENCOUNTER — Ambulatory Visit: Payer: Federal, State, Local not specified - PPO | Admitting: Nurse Practitioner

## 2015-07-04 ENCOUNTER — Other Ambulatory Visit (HOSPITAL_BASED_OUTPATIENT_CLINIC_OR_DEPARTMENT_OTHER): Payer: Federal, State, Local not specified - PPO

## 2015-07-04 ENCOUNTER — Other Ambulatory Visit: Payer: Federal, State, Local not specified - PPO

## 2015-07-04 VITALS — BP 129/82 | HR 94 | Temp 98.6°F | Resp 18 | Ht 62.0 in | Wt 212.3 lb

## 2015-07-04 VITALS — BP 137/90 | HR 75 | Temp 98.5°F | Resp 20

## 2015-07-04 DIAGNOSIS — C50412 Malignant neoplasm of upper-outer quadrant of left female breast: Secondary | ICD-10-CM

## 2015-07-04 DIAGNOSIS — Z5111 Encounter for antineoplastic chemotherapy: Secondary | ICD-10-CM | POA: Diagnosis not present

## 2015-07-04 DIAGNOSIS — Z170 Estrogen receptor positive status [ER+]: Secondary | ICD-10-CM | POA: Diagnosis not present

## 2015-07-04 LAB — COMPREHENSIVE METABOLIC PANEL (CC13)
ALT: 23 U/L (ref 0–55)
ANION GAP: 9 meq/L (ref 3–11)
AST: 14 U/L (ref 5–34)
Albumin: 3.6 g/dL (ref 3.5–5.0)
Alkaline Phosphatase: 65 U/L (ref 40–150)
BILIRUBIN TOTAL: 0.46 mg/dL (ref 0.20–1.20)
BUN: 9.4 mg/dL (ref 7.0–26.0)
CHLORIDE: 105 meq/L (ref 98–109)
CO2: 27 mEq/L (ref 22–29)
CREATININE: 0.8 mg/dL (ref 0.6–1.1)
Calcium: 8.9 mg/dL (ref 8.4–10.4)
EGFR: >90 mL/min/{1.73_m2} (ref >90)
Glucose: 158 mg/dl — ABNORMAL HIGH (ref 70–140)
Potassium: 4.1 mEq/L (ref 3.5–5.1)
Sodium: 141 mEq/L (ref 136–145)
Total Protein: 6.6 g/dL (ref 6.4–8.3)

## 2015-07-04 LAB — CBC WITH DIFFERENTIAL/PLATELET
BASO%: 0.4 % (ref 0.0–2.0)
Basophils Absolute: 0.1 10*3/uL (ref 0.0–0.1)
EOS ABS: 0 10*3/uL (ref 0.0–0.5)
EOS%: 0 % (ref 0.0–7.0)
HCT: 28.7 % — ABNORMAL LOW (ref 34.8–46.6)
HGB: 9.6 g/dL — ABNORMAL LOW (ref 11.6–15.9)
LYMPH#: 1.2 10*3/uL (ref 0.9–3.3)
LYMPH%: 7.8 % — ABNORMAL LOW (ref 14.0–49.7)
MCH: 31.8 pg (ref 25.1–34.0)
MCHC: 33.6 g/dL (ref 31.5–36.0)
MCV: 94.6 fL (ref 79.5–101.0)
MONO#: 0.8 10*3/uL (ref 0.1–0.9)
MONO%: 4.9 % (ref 0.0–14.0)
NEUT%: 86.9 % — ABNORMAL HIGH (ref 38.4–76.8)
NEUTROS ABS: 13.8 10*3/uL — AB (ref 1.5–6.5)
Platelets: 520 10*3/uL — ABNORMAL HIGH (ref 145–400)
RBC: 3.03 10*6/uL — ABNORMAL LOW (ref 3.70–5.45)
RDW: 19.7 % — AB (ref 11.2–14.5)
WBC: 15.8 10*3/uL — AB (ref 3.9–10.3)

## 2015-07-04 MED ORDER — HEPARIN SOD (PORK) LOCK FLUSH 100 UNIT/ML IV SOLN
500.0000 [IU] | Freq: Once | INTRAVENOUS | Status: AC | PRN
  Administered 2015-07-04: 500 [IU]
  Filled 2015-07-04: qty 5

## 2015-07-04 MED ORDER — DIPHENHYDRAMINE HCL 50 MG/ML IJ SOLN
25.0000 mg | Freq: Once | INTRAMUSCULAR | Status: AC
  Administered 2015-07-04: 25 mg via INTRAVENOUS

## 2015-07-04 MED ORDER — PACLITAXEL CHEMO INJECTION 300 MG/50ML
80.0000 mg/m2 | Freq: Once | INTRAVENOUS | Status: AC
  Administered 2015-07-04: 162 mg via INTRAVENOUS
  Filled 2015-07-04: qty 27

## 2015-07-04 MED ORDER — SODIUM CHLORIDE 0.9 % IV SOLN
Freq: Once | INTRAVENOUS | Status: AC
  Administered 2015-07-04: 11:00:00 via INTRAVENOUS
  Filled 2015-07-04: qty 4

## 2015-07-04 MED ORDER — SODIUM CHLORIDE 0.9 % IV SOLN
Freq: Once | INTRAVENOUS | Status: AC
  Administered 2015-07-04: 11:00:00 via INTRAVENOUS

## 2015-07-04 MED ORDER — SODIUM CHLORIDE 0.9 % IJ SOLN
10.0000 mL | INTRAMUSCULAR | Status: DC | PRN
  Administered 2015-07-04: 10 mL
  Filled 2015-07-04: qty 10

## 2015-07-04 MED ORDER — DIPHENHYDRAMINE HCL 50 MG/ML IJ SOLN
INTRAMUSCULAR | Status: AC
  Filled 2015-07-04: qty 1

## 2015-07-04 MED ORDER — FAMOTIDINE IN NACL 20-0.9 MG/50ML-% IV SOLN
INTRAVENOUS | Status: AC
  Filled 2015-07-04: qty 50

## 2015-07-04 MED ORDER — FAMOTIDINE IN NACL 20-0.9 MG/50ML-% IV SOLN
20.0000 mg | Freq: Once | INTRAVENOUS | Status: AC
  Administered 2015-07-04: 20 mg via INTRAVENOUS

## 2015-07-04 NOTE — Patient Instructions (Signed)
[facility] Discharge Instructions for Patients Receiving Chemotherapy  Today you received the following chemotherapy agents Taxol  To help prevent nausea and vomiting after your treatment, we encourage you to take your nausea medication    If you develop nausea and vomiting that is not controlled by your nausea medication, call the clinic.   BELOW ARE SYMPTOMS THAT SHOULD BE REPORTED IMMEDIATELY:  *FEVER GREATER THAN 100.5 F  *CHILLS WITH OR WITHOUT FEVER  NAUSEA AND VOMITING THAT IS NOT CONTROLLED WITH YOUR NAUSEA MEDICATION  *UNUSUAL SHORTNESS OF BREATH  *UNUSUAL BRUISING OR BLEEDING  TENDERNESS IN MOUTH AND THROAT WITH OR WITHOUT PRESENCE OF ULCERS  *URINARY PROBLEMS  *BOWEL PROBLEMS    UNUSUAL RASH Items with * indicate a potential emergency and should be followed up as soon as possible.  Feel free to call the clinic you have any questions or concerns. The clinic phone number is [phone].  Please show the [place] at check-in to the Emergency Department and triage nurse.   Paclitaxel injection What is this medicine? PACLITAXEL (PAK li TAX el) is a chemotherapy drug. It targets fast dividing cells, like cancer cells, and causes these cells to die. This medicine is used to treat ovarian cancer, breast cancer, and other cancers. This medicine may be used for other purposes; ask your health care provider or pharmacist if you have questions. COMMON BRAND NAME(S): Onxol, Taxol What should I tell my health care provider before I take this medicine? They need to know if you have any of these conditions: -blood disorders -irregular heartbeat -infection (especially a virus infection such as chickenpox, cold sores, or herpes) -liver disease -previous or ongoing radiation therapy -an unusual or allergic reaction to paclitaxel, alcohol, polyoxyethylated castor oil, other chemotherapy agents, other medicines, foods, dyes, or preservatives -pregnant  or trying to get pregnant -breast-feeding How should I use this medicine? This drug is given as an infusion into a vein. It is administered in a hospital or clinic by a specially trained health care professional. Talk to your pediatrician regarding the use of this medicine in children. Special care may be needed. Overdosage: If you think you have taken too much of this medicine contact a poison control center or emergency room at once. NOTE: This medicine is only for you. Do not share this medicine with others. What if I miss a dose? It is important not to miss your dose. Call your doctor or health care professional if you are unable to keep an appointment. What may interact with this medicine? Do not take this medicine with any of the following medications: -disulfiram -metronidazole This medicine may also interact with the following medications: -cyclosporine -diazepam -ketoconazole -medicines to increase blood counts like filgrastim, pegfilgrastim, sargramostim -other chemotherapy drugs like cisplatin, doxorubicin, epirubicin, etoposide, teniposide, vincristine -quinidine -testosterone -vaccines -verapamil Talk to your doctor or health care professional before taking any of these medicines: -acetaminophen -aspirin -ibuprofen -ketoprofen -naproxen This list may not describe all possible interactions. Give your health care provider a list of all the medicines, herbs, non-prescription drugs, or dietary supplements you use. Also tell them if you smoke, drink alcohol, or use illegal drugs. Some items may interact with your medicine. What should I watch for while using this medicine? Your condition will be monitored carefully while you are receiving this medicine. You will need important blood work done while you are taking this medicine. This drug may make you feel generally unwell. This is not uncommon, as chemotherapy can  affect healthy cells as well as cancer cells. Report any side  effects. Continue your course of treatment even though you feel ill unless your doctor tells you to stop. In some cases, you may be given additional medicines to help with side effects. Follow all directions for their use. Call your doctor or health care professional for advice if you get a fever, chills or sore throat, or other symptoms of a cold or flu. Do not treat yourself. This drug decreases your body's ability to fight infections. Try to avoid being around people who are sick. This medicine may increase your risk to bruise or bleed. Call your doctor or health care professional if you notice any unusual bleeding. Be careful brushing and flossing your teeth or using a toothpick because you may get an infection or bleed more easily. If you have any dental work done, tell your dentist you are receiving this medicine. Avoid taking products that contain aspirin, acetaminophen, ibuprofen, naproxen, or ketoprofen unless instructed by your doctor. These medicines may hide a fever. Do not become pregnant while taking this medicine. Women should inform their doctor if they wish to become pregnant or think they might be pregnant. There is a potential for serious side effects to an unborn child. Talk to your health care professional or pharmacist for more information. Do not breast-feed an infant while taking this medicine. Men are advised not to father a child while receiving this medicine. What side effects may I notice from receiving this medicine? Side effects that you should report to your doctor or health care professional as soon as possible: -allergic reactions like skin rash, itching or hives, swelling of the face, lips, or tongue -low blood counts - This drug may decrease the number of white blood cells, red blood cells and platelets. You may be at increased risk for infections and bleeding. -signs of infection - fever or chills, cough, sore throat, pain or difficulty passing urine -signs of  decreased platelets or bleeding - bruising, pinpoint red spots on the skin, black, tarry stools, nosebleeds -signs of decreased red blood cells - unusually weak or tired, fainting spells, lightheadedness -breathing problems -chest pain -high or low blood pressure -mouth sores -nausea and vomiting -pain, swelling, redness or irritation at the injection site -pain, tingling, numbness in the hands or feet -slow or irregular heartbeat -swelling of the ankle, feet, hands Side effects that usually do not require medical attention (report to your doctor or health care professional if they continue or are bothersome): -bone pain -complete hair loss including hair on your head, underarms, pubic hair, eyebrows, and eyelashes -changes in the color of fingernails -diarrhea -loosening of the fingernails -loss of appetite -muscle or joint pain -red flush to skin -sweating This list may not describe all possible side effects. Call your doctor for medical advice about side effects. You may report side effects to FDA at 1-800-FDA-1088. Where should I keep my medicine? This drug is given in a hospital or clinic and will not be stored at home. NOTE: This sheet is a summary. It may not cover all possible information. If you have questions about this medicine, talk to your doctor, pharmacist, or health care provider.  2015, [employer] Standard. (2013-01-02 16:41:21)

## 2015-07-04 NOTE — Progress Notes (Signed)
Cheryl [name]  [phone] [phone]     ID: Cheryl [name] DOB: Dec 29, 1975  MR#: [id number]  ZWC#:[id number]  Patient Care Team: No Pcp Per Patient as PCP - General (General Practice) [name], MD as Consulting Physician (Obstetrics and Gynecology) [name], MD as Consulting Physician (General Surgery) [name], MD as Consulting Physician (Oncology) [name], MD as Consulting Physician (Radiation Oncology) [name], RN as Registered Nurse [name], RN as Registered Nurse [name], NP as Nurse Practitioner (Nurse Practitioner) PCP: No PCP Per Patient OTHER MD:  CHIEF COMPLAINT: Left breast cancer  CURRENT TREATMENT: Adjuvant chemotherapy  BREAST CANCER HISTORY: From the original intake note:  The patient had screening mammography 02/01/2015 which showed a calcifications in the upper outer quadrant of the left breast. On 02/08/2015 she underwent left diagnostic mammography with tomosynthesis and left breast ultrasonography at the breast Center. The breast density was category C. Mammography confirmed a group of pleomorphic calcifications spanning 4.2 cm maximally. Physical examination of the upper outer quadrant of the left breast showed an area of thickening at the approximate 2:00 position. Ultrasound of this area showed no discrete masses. There was vague shadowing present. Calcifications could not be clearly identified sonographically. There was no lymphadenopathy in the left axilla.  On the same day the patient underwent biopsy of the left breast area in question, with the pathology (S [id number]) showing ductal carcinoma in situ, grade 2 or 3, with possible areas of microinvasion, the cancer cells being strongly estrogen and progesterone receptor positive, both at 100%.  On 02/15/2015 the patient underwent bilateral breast MRI. This showed the area of malignancy in the upper outer quadrant to  measure 4.5 cm, including a large area of non-masslike enhancement and a 1 cm spiculated mass. There was also an indeterminate oval mass in the central left breast and another indeterminant mass in the slightly outer right breast anteriorly. Biopsy of the right breast mass 02/18/2015 ([id number]) showed a fibroadenoma, with no evidence of malignancy. Biopsy of 2 additional areas of the left breast (at 10:00 and 2:00) showed a fibroadenoma in the upper inner quadrant, and pseudo-angiomatous stromal hyperplasia (PA SH) at the 2:00 area of the left breast.  The patient's subsequent history is as detailed below.  INTERVAL HISTORY:  Cheryl [name] returns today for follow-up of her triple negative invasive breast cancer. Today she is due to start cycle 1 of 12 planned weekly doses of paclitaxel. We will not use carboplatin with this regimen. Cheryl [name] visited our symptom management clinic because of her continued cough. She was given a breathing treatment, and sent home with prescriptions for levaquin, a medrol dose pack, and an albuterol inhaler. She believe she is breathing better with 3 doses left of her levaquin.   REVIEW OF SYSTEMS: Cheryl [name] denies fevers, chills, nausea, vomiting, or changes in bowel or bladder habits. She has some mouth sores and thrush, but these cleared with magic mouthwash and fluconazole. [name] eating and drinking well. She has some mild fatigue. Her nailbeds are becoming hyperpigmented. A detailed review of systems is otherwise stable.  PAST MEDICAL HISTORY: Past Medical History  Diagnosis Date  . Wears contact lenses   . Breast cancer     left breast    PAST SURGICAL HISTORY: Past Surgical History  Procedure Laterality Date  . Breast lumpectomy with needle localization and axillary sentinel lymph node bx Left 03/27/2015    Procedure: LEFT BREAST LUMPECTOMY WITH BRACKETED NEEDLE LOCALIZATION  AND LEFT  AXILLARY SENTINEL LYMPH NODE BX;  Surgeon: [name], MD;  Location:  [place];  Service: General;  Laterality: Left;  . Breast surgery    . Re-excision of breast lumpectomy Left 04/05/2015    Procedure: RE-EXCISION LEFT  BREAST LUMPECTOMY;  Surgeon: [name], MD;  Location: [place];  Service: General;  Laterality: Left;  . Portacath placement Right 04/25/2015    Procedure: INSERTION PORT-A-CATH;  Surgeon: [name], MD;  Location: [facility];  Service: General;  Laterality: Right;    FAMILY HISTORY Family History  Problem Relation Age of Onset  . Lung cancer Maternal Grandmother 31    non smoker  . Lung cancer Maternal Grandfather 49    non smoker worked in [employer]  . Cancer Paternal Grandfather 35    throat cancer ? smoker  . Cancer Cousin 41    throat cancer ? smoker   the patient's parents are living, in their mid-50s as of March 2016. The patient had no siblings. Both the patient's mother's parents died from lung cancer although they did not smoke. [place] and her maternal grandfather was a Ecologist. There is no history of breast or ovarian cancer in the family to her knowledge  GYNECOLOGIC HISTORY:  No LMP recorded. Patient is not currently having periods (Reason: Chemotherapy). Menarche age 60, first live birth age 69. The patient is GX P3. [name] having regular periods. She uses the Essure device for contraception.  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 Her husband [name] is a Freight forwarder. Their daughter [name] lives in [place] where she works in Press photographer. Daughter [name] is a Research scientist (physical sciences) and lives with the patient.. Daughter [name] also at home is 16 years old.    ADVANCED DIRECTIVES: Not in place   HEALTH MAINTENANCE: Social History  Substance Use Topics  . Smoking status: Never Smoker   . Smokeless tobacco: Never Used  . Alcohol Use: Yes     Colonoscopy:  PAP:  Bone density:  Lipid panel:  Allergies    Allergen Reactions  . Adhesive [Tape] Other (See Comments)    Skin blisters    Current Outpatient Prescriptions  Medication Sig Dispense Refill  . albuterol (PROVENTIL HFA;VENTOLIN HFA) 108 (90 BASE) MCG/ACT inhaler Inhale 1-2 puffs into the lungs every 6 (six) hours as needed for wheezing or shortness of breath. 1 Inhaler 1  . HYDROcodone-homatropine (HYCODAN) 5-1.5 MG/5ML syrup TAKE 5MLS BY MOUTH EVERY 6 HOURS AS NEEDED FOR COUGH  0  . levofloxacin (LEVAQUIN) 500 MG tablet Take 1 tablet (500 mg total) by mouth daily. 7 tablet 0  . lidocaine-prilocaine (EMLA) cream Apply to affected area once 30 g 3  . methylPREDNISolone (MEDROL DOSEPAK) 4 MG TBPK tablet Medrol dose pak- take as directed. 21 tablet 0  . Alum & Mag Hydroxide-Simeth (MAGIC MOUTHWASH W/LIDOCAINE) SOLN Take 5 mLs by mouth 4 (four) times daily as needed for mouth pain (swish, swallow or spit). (Patient not taking: Reported on 07/04/2015) 240 mL 1  . ibuprofen (ADVIL,MOTRIN) 400 MG tablet Take 400 mg by mouth every 6 (six) hours as needed.    Marland Kitchen LORazepam (ATIVAN) 0.5 MG tablet Take 0.5 mg by mouth at bedtime.  0  . ondansetron (ZOFRAN) 8 MG tablet Take 1 tablet (8 mg total) by mouth 2 (two) times daily. Start the day after chemo for 2 days. Then take as needed  for nausea or vomiting. (Patient not taking: Reported on 07/04/2015) 30 tablet 1  . prochlorperazine (COMPAZINE) 10 MG tablet Take 1 tablet (10 mg total) by mouth every 6 (six) hours as needed (Nausea or vomiting). (Patient not taking: Reported on 07/04/2015) 30 tablet 1   No current facility-administered medications for this visit.    OBJECTIVE: There-appearing white woman in no acute distress Filed Vitals:   07/04/15 0923  BP: 129/82  Pulse: 94  Temp: 98.6 F (37 C)  Resp: 18     Body mass index is 38.82 kg/(m^2).    ECOG FS:0 - Asymptomatic  Skin: warm, dry  HEENT: sclerae anicteric, conjunctivae pink, oropharynx clear. No thrush or mucositis.  Lymph Nodes: No  cervical or supraclavicular lymphadenopathy  Lungs: clear to auscultation bilaterally, no rales, wheezes, or rhonci  Heart: regular rate and rhythm  Abdomen: round, soft, non tender, positive bowel sounds  Musculoskeletal: No focal spinal tenderness, no peripheral edema  Neuro: non focal, well oriented, positive affect  Breasts: deferred  LAB RESULTS:  CMP     Component Value Date/Time   NA 141 07/04/2015 0902   K 4.1 07/04/2015 0902   CO2 27 07/04/2015 0902   GLUCOSE 158* 07/04/2015 0902   BUN 9.4 07/04/2015 0902   CREATININE 0.8 07/04/2015 0902   CALCIUM 8.9 07/04/2015 0902   PROT 6.6 07/04/2015 0902   ALBUMIN 3.6 07/04/2015 0902   AST 14 07/04/2015 0902   ALT 23 07/04/2015 0902   ALKPHOS 65 07/04/2015 0902   BILITOT 0.46 07/04/2015 0902    INo results found for: SPEP, UPEP  Lab Results  Component Value Date   WBC 15.8* 07/04/2015   NEUTROABS 13.8* 07/04/2015   HGB 9.6* 07/04/2015   HCT 28.7* 07/04/2015   MCV 94.6 07/04/2015   PLT 520* 07/04/2015      Chemistry      Component Value Date/Time   NA 141 07/04/2015 0902   K 4.1 07/04/2015 0902   CO2 27 07/04/2015 0902   BUN 9.4 07/04/2015 0902   CREATININE 0.8 07/04/2015 0902      Component Value Date/Time   CALCIUM 8.9 07/04/2015 0902   ALKPHOS 65 07/04/2015 0902   AST 14 07/04/2015 0902   ALT 23 07/04/2015 0902   BILITOT 0.46 07/04/2015 0902       No results found for: LABCA2  No components found for: LABCA125  No results for input(s): INR in the last 168 hours.  Urinalysis    Component Value Date/Time   LABSPEC 1.020 05/30/2015 1502   PHURINE 6.0 05/30/2015 1502   GLUCOSEU Negative 05/30/2015 1502   HGBUR Small 05/30/2015 1502   BILIRUBINUR Negative 05/30/2015 1502   KETONESUR Negative 05/30/2015 1502   PROTEINUR Negative 05/30/2015 1502   UROBILINOGEN 0.2 05/30/2015 1502   NITRITE Negative 05/30/2015 1502   LEUKOCYTESUR Negative 05/30/2015 1502    STUDIES: Dg Chest 2 View  06/27/2015    CLINICAL DATA:  Chronic cough for 2 months. Patient recently completed four chemotherapy cycles for breast cancer.  EXAM: CHEST  2 VIEW  COMPARISON:  04/25/2015  FINDINGS: Injectable port from right internal jugular approach is seen with tip projecting at the expected location of the cavoatrial junction. The heart size and mediastinal contours are within normal limits. There is a linear opacity within the right lung at the expected location of the right middle lobe. Its shape suggests atelectasis versus perifissural fluid collection. There is no evidence of pleural effusion or pneumothorax. There is increased density  of bilateral hilar regions. The visualized skeletal structures and soft tissues are unremarkable.  IMPRESSION: Linear parenchymal opacity likely within the right middle lobe. Differential diagnosis includes atelectasis versus perifissural fluid collection.  Increased density of bilateral hilar regions. Query lymphadenopathy or peribronchial thickening.   Electronically Signed   By: Fidela Salisbury M.D.   On: 06/27/2015 14:24    ASSESSMENT: 39 y.o. [place] status post left breast biopsy 02/08/2015 for ductal carcinoma in situ, grade 2 or 3, strongly estrogen and progesterone receptor positive, with likely areas of microinvasion  (a) biopsy of an area in the left breast upper outer quadrant showed PASH  (b) biopsy of 2 additional questionable areas, one in each breast, showed bilateral fibroadenomas  (1) genetics testing March 2016 through the BreastNext gene panel offered by [employer] showed no mutations in the following 17 genes: ATM, BARD1, BRCA1, BRCA2, BRIP1, CDH1, CHEK2, MRE11A, MUTYH, NBN, NF1, PALB2, PTEN, RAD50, RAD51C, RAD51D, and TP53.  (2) status post left lumpectomy with sentinel lymph node sampling 03/27/2015 for a pT1c pN0, stage IA invasive ductal carcinoma, grade 3, triple negative, with an MIB-1 of 33%  (a) close margins were cleared with subsequent excision  04/05/2015.  (3) Oncotype DX score of 38 predicts a risk of 26% outside the breast recurrence within 10 years if the patient's only systemic treatment is tamoxifen for 5 years  (4) adjuvant chemotherapy started 05/02/2015 consisting of doxorubicin and cyclophosphamide in dose dense fashion 4, completed 06/13/2015, followed by paclitaxel weekly 12.  (5) adjuvant radiation to follow chemotherapy  (5) tamoxifen started 02/20/2015 (neoadjuvantly), discontinued 04/19/2015 so as not to overlap chemotherapy; to be resumed at the completion of radiation  PLAN: Cheryl [name] is doing well today. The labs were reviewed in detail and were entirely stable. She will proceed with cycle 1 of paclitaxel as planned today.   We briefly reviewed her antiemetic schedule. She will avoid use of dexamethasone and will rely on zofran and compazine alone. She knows the potential for a drug reaction on her first cycle, and the risk of peripheral neuropathy with continued treatment.  Cheryl [name] will return in 1 week for labs and cycle 2 of treatment. She understands and agrees with this plan. She knows the goal of treatment in her case is cure. She has been encouraged to call with any issues that might arise before her next visit here.   Cheryl Panda, NP   07/04/2015 9:44 AM

## 2015-07-04 NOTE — Telephone Encounter (Signed)
Gave avs & calendar for August/September °

## 2015-07-05 ENCOUNTER — Ambulatory Visit: Payer: Federal, State, Local not specified - PPO | Admitting: Nurse Practitioner

## 2015-07-05 ENCOUNTER — Encounter: Payer: Self-pay | Admitting: Nurse Practitioner

## 2015-07-05 ENCOUNTER — Other Ambulatory Visit: Payer: Federal, State, Local not specified - PPO

## 2015-07-05 ENCOUNTER — Ambulatory Visit: Payer: Federal, State, Local not specified - PPO

## 2015-07-05 ENCOUNTER — Telehealth: Payer: Self-pay | Admitting: *Deleted

## 2015-07-05 NOTE — Telephone Encounter (Signed)
-----   Message from Azzie Glatter, RN sent at 07/04/2015  1:25 PM EDT ----- Regarding: "1st time chemotherapy--Dr. Magrinat" Patient received Taxol for the first time, per Dr. Jana Hakim.  Tolerated treatment well.

## 2015-07-05 NOTE — Telephone Encounter (Signed)
This RN attempted to reach pt per follow up chemo call post 1st Taxol.  Obtained number identified VM - message left to return call.

## 2015-07-08 ENCOUNTER — Other Ambulatory Visit: Payer: Self-pay | Admitting: *Deleted

## 2015-07-08 MED ORDER — FAMOTIDINE 20 MG PO TABS: 20.0000 mg | ORAL_TABLET | Freq: Two times a day (BID) | ORAL | Status: DC

## 2015-07-11 ENCOUNTER — Ambulatory Visit (HOSPITAL_BASED_OUTPATIENT_CLINIC_OR_DEPARTMENT_OTHER): Payer: Federal, State, Local not specified - PPO

## 2015-07-11 ENCOUNTER — Encounter: Payer: Self-pay | Admitting: Nurse Practitioner

## 2015-07-11 ENCOUNTER — Other Ambulatory Visit (HOSPITAL_BASED_OUTPATIENT_CLINIC_OR_DEPARTMENT_OTHER): Payer: Federal, State, Local not specified - PPO

## 2015-07-11 ENCOUNTER — Ambulatory Visit (HOSPITAL_BASED_OUTPATIENT_CLINIC_OR_DEPARTMENT_OTHER): Payer: Federal, State, Local not specified - PPO | Admitting: Nurse Practitioner

## 2015-07-11 VITALS — BP 120/77 | HR 104 | Temp 98.8°F | Resp 17 | Ht 62.0 in | Wt 214.9 lb

## 2015-07-11 DIAGNOSIS — G620 Drug-induced polyneuropathy: Secondary | ICD-10-CM

## 2015-07-11 DIAGNOSIS — C50412 Malignant neoplasm of upper-outer quadrant of left female breast: Secondary | ICD-10-CM

## 2015-07-11 DIAGNOSIS — Z5111 Encounter for antineoplastic chemotherapy: Secondary | ICD-10-CM | POA: Diagnosis not present

## 2015-07-11 DIAGNOSIS — T451X5A Adverse effect of antineoplastic and immunosuppressive drugs, initial encounter: Secondary | ICD-10-CM | POA: Insufficient documentation

## 2015-07-11 HISTORY — DX: Drug-induced polyneuropathy: T45.1X5A

## 2015-07-11 HISTORY — DX: Adverse effect of antineoplastic and immunosuppressive drugs, initial encounter: G62.0

## 2015-07-11 LAB — CBC WITH DIFFERENTIAL/PLATELET
BASO%: 1.6 % (ref 0.0–2.0)
Basophils Absolute: 0.1 10*3/uL (ref 0.0–0.1)
EOS%: 1.5 % (ref 0.0–7.0)
Eosinophils Absolute: 0.1 10*3/uL (ref 0.0–0.5)
HCT: 29.1 % — ABNORMAL LOW (ref 34.8–46.6)
HEMOGLOBIN: 9.9 g/dL — AB (ref 11.6–15.9)
LYMPH%: 18.4 % (ref 14.0–49.7)
MCH: 33.2 pg (ref 25.1–34.0)
MCHC: 34.1 g/dL (ref 31.5–36.0)
MCV: 97.1 fL (ref 79.5–101.0)
MONO#: 0.7 10*3/uL (ref 0.1–0.9)
MONO%: 8.9 % (ref 0.0–14.0)
NEUT%: 69.6 % (ref 38.4–76.8)
NEUTROS ABS: 5.8 10*3/uL (ref 1.5–6.5)
Platelets: 444 10*3/uL — ABNORMAL HIGH (ref 145–400)
RBC: 2.99 10*6/uL — AB (ref 3.70–5.45)
RDW: 19.5 % — AB (ref 11.2–14.5)
WBC: 8.3 10*3/uL (ref 3.9–10.3)
lymph#: 1.5 10*3/uL (ref 0.9–3.3)

## 2015-07-11 LAB — COMPREHENSIVE METABOLIC PANEL (CC13)
ALT: 25 U/L (ref 0–55)
AST: 19 U/L (ref 5–34)
Albumin: 3.5 g/dL (ref 3.5–5.0)
Alkaline Phosphatase: 63 U/L (ref 40–150)
Anion Gap: 13 mEq/L — ABNORMAL HIGH (ref 3–11)
BUN: 7.1 mg/dL (ref 7.0–26.0)
CO2: 22 meq/L (ref 22–29)
Calcium: 9.2 mg/dL (ref 8.4–10.4)
Chloride: 104 mEq/L (ref 98–109)
Creatinine: 0.8 mg/dL (ref 0.6–1.1)
GLUCOSE: 163 mg/dL — AB (ref 70–140)
POTASSIUM: 4.2 meq/L (ref 3.5–5.1)
SODIUM: 140 meq/L (ref 136–145)
TOTAL PROTEIN: 6.5 g/dL (ref 6.4–8.3)
Total Bilirubin: 0.52 mg/dL (ref 0.20–1.20)

## 2015-07-11 MED ORDER — PACLITAXEL CHEMO INJECTION 300 MG/50ML
80.0000 mg/m2 | Freq: Once | INTRAVENOUS | Status: AC
  Administered 2015-07-11: 162 mg via INTRAVENOUS
  Filled 2015-07-11: qty 27

## 2015-07-11 MED ORDER — SODIUM CHLORIDE 0.9 % IV SOLN
Freq: Once | INTRAVENOUS | Status: AC
  Administered 2015-07-11: 15:00:00 via INTRAVENOUS
  Filled 2015-07-11: qty 4

## 2015-07-11 MED ORDER — DIPHENHYDRAMINE HCL 50 MG/ML IJ SOLN
25.0000 mg | Freq: Once | INTRAMUSCULAR | Status: AC
  Administered 2015-07-11: 25 mg via INTRAVENOUS

## 2015-07-11 MED ORDER — HEPARIN SOD (PORK) LOCK FLUSH 100 UNIT/ML IV SOLN
500.0000 [IU] | Freq: Once | INTRAVENOUS | Status: AC | PRN
  Administered 2015-07-11: 500 [IU]
  Filled 2015-07-11: qty 5

## 2015-07-11 MED ORDER — FAMOTIDINE IN NACL 20-0.9 MG/50ML-% IV SOLN
20.0000 mg | Freq: Once | INTRAVENOUS | Status: AC
  Administered 2015-07-11: 20 mg via INTRAVENOUS

## 2015-07-11 MED ORDER — DIPHENHYDRAMINE HCL 50 MG/ML IJ SOLN
INTRAMUSCULAR | Status: AC
  Filled 2015-07-11: qty 1

## 2015-07-11 MED ORDER — FAMOTIDINE IN NACL 20-0.9 MG/50ML-% IV SOLN
INTRAVENOUS | Status: AC
  Filled 2015-07-11: qty 50

## 2015-07-11 MED ORDER — SODIUM CHLORIDE 0.9 % IJ SOLN
10.0000 mL | INTRAMUSCULAR | Status: DC | PRN
  Administered 2015-07-11: 10 mL
  Filled 2015-07-11: qty 10

## 2015-07-11 MED ORDER — SODIUM CHLORIDE 0.9 % IV SOLN
Freq: Once | INTRAVENOUS | Status: AC
Start: 2015-07-11 — End: 2015-07-11
  Administered 2015-07-11: 14:00:00 via INTRAVENOUS

## 2015-07-11 NOTE — Progress Notes (Signed)
[name]  [phone] [phone]     ID: Cheryl [name] DOB: 1976-04-20  MR#: [id number]  SWN#:[id number]  Patient Care Team: No Pcp Per Patient as PCP - General (General Practice) [name], MD as Consulting Physician (Obstetrics and Gynecology) [name], MD as Consulting Physician (General Surgery) [name], MD as Consulting Physician (Oncology) [name], MD as Consulting Physician (Radiation Oncology) [name], RN as Registered Nurse [name], RN as Registered Nurse [name], NP as Nurse Practitioner (Nurse Practitioner) PCP: No PCP Per Patient OTHER MD:  CHIEF COMPLAINT: Left breast cancer  CURRENT TREATMENT: Adjuvant chemotherapy  BREAST CANCER HISTORY: From the original intake note:  The patient had screening mammography 02/01/2015 which showed a calcifications in the upper outer quadrant of the left breast. On 02/08/2015 she underwent left diagnostic mammography with tomosynthesis and left breast ultrasonography at the breast Center. The breast density was category C. Mammography confirmed a group of pleomorphic calcifications spanning 4.2 cm maximally. Physical examination of the upper outer quadrant of the left breast showed an area of thickening at the approximate 2:00 position. Ultrasound of this area showed no discrete masses. There was vague shadowing present. Calcifications could not be clearly identified sonographically. There was no lymphadenopathy in the left axilla.  On the same day the patient underwent biopsy of the left breast area in question, with the pathology ([facility] [id number]) showing ductal carcinoma in situ, grade 2 or 3, with possible areas of microinvasion, the cancer cells being strongly estrogen and progesterone receptor positive, both at 100%.  On 02/15/2015 the patient underwent bilateral breast MRI. This showed the area of malignancy in the upper outer quadrant to  measure 4.5 cm, including a large area of non-masslike enhancement and a 1 cm spiculated mass. There was also an indeterminate oval mass in the central left breast and another indeterminant mass in the slightly outer right breast anteriorly. Biopsy of the right breast mass 02/18/2015 ([id number]) showed a fibroadenoma, with no evidence of malignancy. Biopsy of 2 additional areas of the left breast (at 10:00 and 2:00) showed a fibroadenoma in the upper inner quadrant, and pseudo-angiomatous stromal hyperplasia (PA SH) at the 2:00 area of the left breast.  The patient's subsequent history is as detailed below.  INTERVAL HISTORY:  Cheryl [name] returns today for follow-up of her triple negative invasive breast cancer. Today she is due for cycle 2 of 12 planned weekly doses of paclitaxel. She tolerated her first cycle very well. She did not take any of her antiemetics. [name] moving her bowels well. She had no mouth sores or rashes. Her appetite and energy levels are good. She did however have numbness to the tips of her fingers for 3 days, but this resolved on its own. She also had "raging" heartburn over the weekend, but this was likely related to her medrol dose pack, prescribed to her for her recurrent cough. Her cough is much better now. She started on OTC omeprazole for the heartburn, and now feels relief.   REVIEW OF SYSTEMS: A detailed review of systems is otherwise entirely negative, except where noted above.   PAST MEDICAL HISTORY: Past Medical History  Diagnosis Date  . Wears contact lenses   . Breast cancer     left breast    PAST SURGICAL HISTORY: Past Surgical History  Procedure Laterality Date  . Breast lumpectomy with needle localization and axillary sentinel lymph node bx Left 03/27/2015    Procedure: LEFT  BREAST LUMPECTOMY WITH BRACKETED NEEDLE LOCALIZATION AND LEFT  AXILLARY SENTINEL LYMPH NODE BX;  Surgeon: [name], MD;  Location: [facility];  Service:  General;  Laterality: Left;  . Breast surgery    . Re-excision of breast lumpectomy Left 04/05/2015    Procedure: RE-EXCISION LEFT  BREAST LUMPECTOMY;  Surgeon: [name], MD;  Location: [facility];  Service: General;  Laterality: Left;  . Portacath placement Right 04/25/2015    Procedure: INSERTION PORT-A-CATH;  Surgeon: [name], MD;  Location: [facility];  Service: General;  Laterality: Right;    FAMILY HISTORY Family History  Problem Relation Age of Onset  . Lung cancer Maternal Grandmother 67    non smoker  . Lung cancer Maternal Grandfather 32    non smoker worked in [employer]  . Cancer Paternal Grandfather 35    throat cancer ? smoker  . Cancer Cousin 41    throat cancer ? smoker   the patient's parents are living, in their mid-50s as of March 2016. The patient had no siblings. Both the patient's mother's parents died from lung cancer although they did not smoke. [place] and her maternal grandfather was a Ecologist. There is no history of breast or ovarian cancer in the family to her knowledge  GYNECOLOGIC HISTORY:  No LMP recorded. Patient is not currently having periods (Reason: Chemotherapy). Menarche age 63, first live birth age 85. The patient is GX P3. [name] having regular periods. She uses the Essure device for contraception.  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 Her husband Cheryl [name] is a Freight forwarder. Their daughter Cheryl [name] lives in [place] where she works in Press photographer. Daughter Cheryl [name] is a Research scientist (physical sciences) and lives with the patient.. Daughter Cheryl [name] also at home is 72 years old.    ADVANCED DIRECTIVES: Not in place   HEALTH MAINTENANCE: Social History  Substance Use Topics  . Smoking status: Never Smoker   . Smokeless tobacco: Never Used  . Alcohol Use: Yes     Colonoscopy:  PAP:  Bone density:  Lipid panel:  Allergies  Allergen Reactions  . Adhesive [Tape]  Other (See Comments)    Skin blisters    Current Outpatient Prescriptions  Medication Sig Dispense Refill  . famotidine (PEPCID) 20 MG tablet Take 1 tablet (20 mg total) by mouth 2 (two) times daily. (Patient taking differently: Take 20 mg by mouth daily. ) 60 tablet 6  . lidocaine-prilocaine (EMLA) cream Apply to affected area once 30 g 3  . albuterol (PROVENTIL HFA;VENTOLIN HFA) 108 (90 BASE) MCG/ACT inhaler Inhale 1-2 puffs into the lungs every 6 (six) hours as needed for wheezing or shortness of breath. (Patient not taking: Reported on 07/11/2015) 1 Inhaler 1  . Alum & Mag Hydroxide-Simeth (MAGIC MOUTHWASH W/LIDOCAINE) SOLN Take 5 mLs by mouth 4 (four) times daily as needed for mouth pain (swish, swallow or spit). (Patient not taking: Reported on 07/04/2015) 240 mL 1  . HYDROcodone-homatropine (HYCODAN) 5-1.5 MG/5ML syrup TAKE 5MLS BY MOUTH EVERY 6 HOURS AS NEEDED FOR COUGH  0  . ibuprofen (ADVIL,MOTRIN) 400 MG tablet Take 400 mg by mouth every 6 (six) hours as needed.    Marland Kitchen LORazepam (ATIVAN) 0.5 MG tablet Take 0.5 mg by mouth at bedtime.  0  . ondansetron (ZOFRAN) 8 MG tablet Take 1 tablet (8 mg total) by mouth 2 (two) times daily. Start the day after chemo for  2 days. Then take as needed for nausea or vomiting. (Patient not taking: Reported on 07/04/2015) 30 tablet 1  . prochlorperazine (COMPAZINE) 10 MG tablet Take 1 tablet (10 mg total) by mouth every 6 (six) hours as needed (Nausea or vomiting). (Patient not taking: Reported on 07/04/2015) 30 tablet 1   No current facility-administered medications for this visit.    OBJECTIVE: There-appearing white woman in no acute distress Filed Vitals:   07/11/15 1338  BP: 120/77  Pulse: 104  Temp: 98.8 F (37.1 C)  Resp: 17     Body mass index is 39.3 kg/(m^2).    ECOG FS:0 - Asymptomatic  Sclerae unicteric, pupils round and equal Oropharynx clear and moist-- no thrush or other lesions No cervical or supraclavicular adenopathy Lungs no rales  or rhonchi Heart regular rate and rhythm Abd soft, nontender, positive bowel sounds MSK no focal spinal tenderness, no upper extremity lymphedema Neuro: nonfocal, well oriented, appropriate affect Breasts: deferred  LAB RESULTS:  CMP     Component Value Date/Time   NA 140 07/11/2015 1319   K 4.2 07/11/2015 1319   CO2 22 07/11/2015 1319   GLUCOSE 163* 07/11/2015 1319   BUN 7.1 07/11/2015 1319   CREATININE 0.8 07/11/2015 1319   CALCIUM 9.2 07/11/2015 1319   PROT 6.5 07/11/2015 1319   ALBUMIN 3.5 07/11/2015 1319   AST 19 07/11/2015 1319   ALT 25 07/11/2015 1319   ALKPHOS 63 07/11/2015 1319   BILITOT 0.52 07/11/2015 1319    INo results found for: SPEP, UPEP  Lab Results  Component Value Date   WBC 8.3 07/11/2015   NEUTROABS 5.8 07/11/2015   HGB 9.9* 07/11/2015   HCT 29.1* 07/11/2015   MCV 97.1 07/11/2015   PLT 444* 07/11/2015      Chemistry      Component Value Date/Time   NA 140 07/11/2015 1319   K 4.2 07/11/2015 1319   CO2 22 07/11/2015 1319   BUN 7.1 07/11/2015 1319   CREATININE 0.8 07/11/2015 1319      Component Value Date/Time   CALCIUM 9.2 07/11/2015 1319   ALKPHOS 63 07/11/2015 1319   AST 19 07/11/2015 1319   ALT 25 07/11/2015 1319   BILITOT 0.52 07/11/2015 1319       No results found for: LABCA2  No components found for: ENIDP824  No results for input(s): INR in the last 168 hours.  Urinalysis    Component Value Date/Time   LABSPEC 1.020 05/30/2015 1502   PHURINE 6.0 05/30/2015 1502   GLUCOSEU Negative 05/30/2015 1502   HGBUR Small 05/30/2015 1502   BILIRUBINUR Negative 05/30/2015 1502   KETONESUR Negative 05/30/2015 1502   PROTEINUR Negative 05/30/2015 1502   UROBILINOGEN 0.2 05/30/2015 1502   NITRITE Negative 05/30/2015 1502   LEUKOCYTESUR Negative 05/30/2015 1502    STUDIES: Dg Chest 2 View  06/27/2015   CLINICAL DATA:  Chronic cough for 2 months. Patient recently completed four chemotherapy cycles for breast cancer.  EXAM: CHEST   2 VIEW  COMPARISON:  04/25/2015  FINDINGS: Injectable port from right internal jugular approach is seen with tip projecting at the expected location of the cavoatrial junction. The heart size and mediastinal contours are within normal limits. There is a linear opacity within the right lung at the expected location of the right middle lobe. Its shape suggests atelectasis versus perifissural fluid collection. There is no evidence of pleural effusion or pneumothorax. There is increased density of bilateral hilar regions. The visualized skeletal structures and soft  tissues are unremarkable.  IMPRESSION: Linear parenchymal opacity likely within the right middle lobe. Differential diagnosis includes atelectasis versus perifissural fluid collection.  Increased density of bilateral hilar regions. Query lymphadenopathy or peribronchial thickening.   Electronically Signed   By: Fidela Salisbury M.D.   On: 06/27/2015 14:24    ASSESSMENT: 39 y.o. [place], [place] woman status post left breast biopsy 02/08/2015 for ductal carcinoma in situ, grade 2 or 3, strongly estrogen and progesterone receptor positive, with likely areas of microinvasion  (a) biopsy of an area in the left breast upper outer quadrant showed PASH  (b) biopsy of 2 additional questionable areas, one in each breast, showed bilateral fibroadenomas  (1) genetics testing March 2016 through the BreastNext gene panel offered by [employer] showed no mutations in the following 17 genes: ATM, BARD1, BRCA1, BRCA2, BRIP1, CDH1, CHEK2, MRE11A, MUTYH, NBN, NF1, PALB2, PTEN, RAD50, RAD51C, RAD51D, and TP53.  (2) status post left lumpectomy with sentinel lymph node sampling 03/27/2015 for a pT1c pN0, stage IA invasive ductal carcinoma, grade 3, triple negative, with an MIB-1 of 33%  (a) close margins were cleared with subsequent excision 04/05/2015.  (3) Oncotype DX score of 38 predicts a risk of 26% outside the breast recurrence within 10 years if the patient's  only systemic treatment is tamoxifen for 5 years  (4) adjuvant chemotherapy started 05/02/2015 consisting of doxorubicin and cyclophosphamide in dose dense fashion 4, completed 06/13/2015, followed by paclitaxel weekly 12.  (5) adjuvant radiation to follow chemotherapy  (5) tamoxifen started 02/20/2015 (neoadjuvantly), discontinued 04/19/2015 so as not to overlap chemotherapy; to be resumed at the completion of radiation  PLAN: Cheryl [name] tolerated her first cycle of paclitaxel remarkably well. The labs were reviewed in detail and were entirely stable. She will proceed with cycle 2 of paclitaxel as planned today. She will continue to monitor signs of early peripheral neuropathy, but as of today no symptoms are present.  Cheryl [name] will return in 1 week for labs and cycle 3 of treatment. She understands and agrees with this plan. She knows the goal of treatment in her case is cure. She has been encouraged to call with any issues that might arise before her next visit here.   Laurie Panda, NP   07/11/2015 2:05 PM

## 2015-07-11 NOTE — Patient Instructions (Signed)
[facility] Discharge Instructions for Patients Receiving Chemotherapy  Today you received the following chemotherapy agents: Taxol.  To help prevent nausea and vomiting after your treatment, we encourage you to take your nausea medication: Compazine 10 mg every 6 hours as needed; Zofran 8 mg every 12 hours to start after chemo for 2 days.   If you develop nausea and vomiting that is not controlled by your nausea medication, call the clinic.   BELOW ARE SYMPTOMS THAT SHOULD BE REPORTED IMMEDIATELY:  *FEVER GREATER THAN 100.5 F  *CHILLS WITH OR WITHOUT FEVER  NAUSEA AND VOMITING THAT IS NOT CONTROLLED WITH YOUR NAUSEA MEDICATION  *UNUSUAL SHORTNESS OF BREATH  *UNUSUAL BRUISING OR BLEEDING  TENDERNESS IN MOUTH AND THROAT WITH OR WITHOUT PRESENCE OF ULCERS  *URINARY PROBLEMS  *BOWEL PROBLEMS  UNUSUAL RASH Items with * indicate a potential emergency and should be followed up as soon as possible.  Feel free to call the clinic you have any questions or concerns. The clinic phone number is [phone].  Please show the [facility] at check-in to the Emergency Department and triage nurse.

## 2015-07-12 ENCOUNTER — Ambulatory Visit: Payer: Federal, State, Local not specified - PPO | Admitting: Nurse Practitioner

## 2015-07-12 ENCOUNTER — Other Ambulatory Visit: Payer: Federal, State, Local not specified - PPO

## 2015-07-12 ENCOUNTER — Encounter: Payer: Self-pay | Admitting: *Deleted

## 2015-07-12 ENCOUNTER — Ambulatory Visit: Payer: Federal, State, Local not specified - PPO

## 2015-07-18 ENCOUNTER — Ambulatory Visit: Payer: Federal, State, Local not specified - PPO

## 2015-07-18 ENCOUNTER — Other Ambulatory Visit: Payer: Federal, State, Local not specified - PPO

## 2015-07-18 ENCOUNTER — Encounter: Payer: Self-pay | Admitting: Nurse Practitioner

## 2015-07-18 ENCOUNTER — Ambulatory Visit (HOSPITAL_BASED_OUTPATIENT_CLINIC_OR_DEPARTMENT_OTHER): Payer: Federal, State, Local not specified - PPO | Admitting: Nurse Practitioner

## 2015-07-18 ENCOUNTER — Ambulatory Visit: Payer: Federal, State, Local not specified - PPO | Admitting: Nurse Practitioner

## 2015-07-18 ENCOUNTER — Other Ambulatory Visit (HOSPITAL_BASED_OUTPATIENT_CLINIC_OR_DEPARTMENT_OTHER): Payer: Federal, State, Local not specified - PPO

## 2015-07-18 VITALS — BP 123/88 | HR 103 | Temp 98.6°F | Resp 18 | Ht 62.0 in | Wt 219.2 lb

## 2015-07-18 DIAGNOSIS — D0512 Intraductal carcinoma in situ of left breast: Secondary | ICD-10-CM | POA: Diagnosis not present

## 2015-07-18 DIAGNOSIS — C50412 Malignant neoplasm of upper-outer quadrant of left female breast: Secondary | ICD-10-CM

## 2015-07-18 DIAGNOSIS — Z171 Estrogen receptor negative status [ER-]: Secondary | ICD-10-CM

## 2015-07-18 DIAGNOSIS — G620 Drug-induced polyneuropathy: Secondary | ICD-10-CM | POA: Diagnosis not present

## 2015-07-18 DIAGNOSIS — T451X5A Adverse effect of antineoplastic and immunosuppressive drugs, initial encounter: Secondary | ICD-10-CM

## 2015-07-18 LAB — CBC WITH DIFFERENTIAL/PLATELET
BASO%: 1.2 % (ref 0.0–2.0)
BASOS ABS: 0.1 10*3/uL (ref 0.0–0.1)
EOS ABS: 0.3 10*3/uL (ref 0.0–0.5)
EOS%: 3.9 % (ref 0.0–7.0)
HCT: 30.2 % — ABNORMAL LOW (ref 34.8–46.6)
HEMOGLOBIN: 10 g/dL — AB (ref 11.6–15.9)
LYMPH%: 20.1 % (ref 14.0–49.7)
MCH: 32.9 pg (ref 25.1–34.0)
MCHC: 33.1 g/dL (ref 31.5–36.0)
MCV: 99.3 fL (ref 79.5–101.0)
MONO#: 0.6 10*3/uL (ref 0.1–0.9)
MONO%: 8.3 % (ref 0.0–14.0)
NEUT%: 66.5 % (ref 38.4–76.8)
NEUTROS ABS: 4.8 10*3/uL (ref 1.5–6.5)
PLATELETS: 328 10*3/uL (ref 145–400)
RBC: 3.04 10*6/uL — ABNORMAL LOW (ref 3.70–5.45)
RDW: 17.9 % — AB (ref 11.2–14.5)
WBC: 7.2 10*3/uL (ref 3.9–10.3)
lymph#: 1.5 10*3/uL (ref 0.9–3.3)

## 2015-07-18 LAB — COMPREHENSIVE METABOLIC PANEL (CC13)
ALBUMIN: 3.5 g/dL (ref 3.5–5.0)
ALK PHOS: 63 U/L (ref 40–150)
ALT: 33 U/L (ref 0–55)
ANION GAP: 10 meq/L (ref 3–11)
AST: 21 U/L (ref 5–34)
BILIRUBIN TOTAL: 0.62 mg/dL (ref 0.20–1.20)
BUN: 7.5 mg/dL (ref 7.0–26.0)
CALCIUM: 9.3 mg/dL (ref 8.4–10.4)
CO2: 26 mEq/L (ref 22–29)
Chloride: 104 mEq/L (ref 98–109)
Creatinine: 0.8 mg/dL (ref 0.6–1.1)
GLUCOSE: 132 mg/dL (ref 70–140)
POTASSIUM: 4.5 meq/L (ref 3.5–5.1)
SODIUM: 140 meq/L (ref 136–145)
TOTAL PROTEIN: 6.7 g/dL (ref 6.4–8.3)

## 2015-07-18 NOTE — Progress Notes (Signed)
[facility]  [phone] [phone]     ID: Cheryl [name] DOB: 13-May-1976  MR#: [id number]  YQI#:[id number]  Patient Care Team: No Pcp Per Patient as PCP - General (General Practice) [name], MD as Consulting Physician (Obstetrics and Gynecology) [name], MD as Consulting Physician (General Surgery) [name], MD as Consulting Physician (Oncology) [name], MD as Consulting Physician (Radiation Oncology) [name], RN as Registered Nurse [name], RN as Registered Nurse [name], NP as Nurse Practitioner (Nurse Practitioner) PCP: No PCP Per Patient OTHER MD:  CHIEF COMPLAINT: Left breast cancer  CURRENT TREATMENT: Adjuvant chemotherapy  BREAST CANCER HISTORY: From the original intake note:  The patient had screening mammography 02/01/2015 which showed a calcifications in the upper outer quadrant of the left breast. On 02/08/2015 she underwent left diagnostic mammography with tomosynthesis and left breast ultrasonography at the breast Center. The breast density was category C. Mammography confirmed a group of pleomorphic calcifications spanning 4.2 cm maximally. Physical examination of the upper outer quadrant of the left breast showed an area of thickening at the approximate 2:00 position. Ultrasound of this area showed no discrete masses. There was vague shadowing present. Calcifications could not be clearly identified sonographically. There was no lymphadenopathy in the left axilla.  On the same day the patient underwent biopsy of the left breast area in question, with the pathology (S [id number]) showing ductal carcinoma in situ, grade 2 or 3, with possible areas of microinvasion, the cancer cells being strongly estrogen and progesterone receptor positive, both at 100%.  On 02/15/2015 the patient underwent bilateral breast MRI. This showed the area of malignancy in the upper outer quadrant to  measure 4.5 cm, including a large area of non-masslike enhancement and a 1 cm spiculated mass. There was also an indeterminate oval mass in the central left breast and another indeterminant mass in the slightly outer right breast anteriorly. Biopsy of the right breast mass 02/18/2015 ([id number]) showed a fibroadenoma, with no evidence of malignancy. Biopsy of 2 additional areas of the left breast (at 10:00 and 2:00) showed a fibroadenoma in the upper inner quadrant, and pseudo-angiomatous stromal hyperplasia (PA SH) at the 2:00 area of the left breast.  The patient's subsequent history is as detailed below.  INTERVAL HISTORY:  Cheryl [name] returns today for follow-up of her triple negative invasive breast cancer. Today she is due for cycle 3 of 12 planned weekly doses of paclitaxel.  REVIEW OF SYSTEMS: Cheryl [name] is doing well today. Her only complaint today is persistent neuropathy to her fingertips and toetips, where this has previously been intermittent. She also admits that she was at there grocery store the other day and dropped the same package of meat 3 times in a row, because she couldn't seem to grip it well. Otherwise she is tolerating treatment remarkably well. She denies fevers, chills, nausea, vomiting, or changes in bowel or bladder habits. She has no mouth sores or rashes. [name] eating an drinking well. Her energy level is decent.  PAST MEDICAL HISTORY: Past Medical History  Diagnosis Date  . Wears contact lenses   . Breast cancer     left breast    PAST SURGICAL HISTORY: Past Surgical History  Procedure Laterality Date  . Breast lumpectomy with needle localization and axillary sentinel lymph node bx Left 03/27/2015    Procedure: LEFT BREAST LUMPECTOMY WITH BRACKETED NEEDLE LOCALIZATION AND LEFT  AXILLARY SENTINEL LYMPH NODE BX;  Surgeon: [name], MD;  Location: [facility];  Service: General;  Laterality: Left;  . Breast surgery    . Re-excision of breast  lumpectomy Left 04/05/2015    Procedure: RE-EXCISION LEFT  BREAST LUMPECTOMY;  Surgeon: [name], MD;  Location: [facility];  Service: General;  Laterality: Left;  . Portacath placement Right 04/25/2015    Procedure: INSERTION PORT-A-CATH;  Surgeon: [name], MD;  Location: [facility];  Service: General;  Laterality: Right;    FAMILY HISTORY Family History  Problem Relation Age of Onset  . Lung cancer Maternal Grandmother 34    non smoker  . Lung cancer Maternal Grandfather 4    non smoker worked in [employer]  . Cancer Paternal Grandfather 35    throat cancer ? smoker  . Cancer Cousin 41    throat cancer ? smoker   the patient's parents are living, in their mid-50s as of March 2016. The patient had no siblings. Both the patient's mother's parents died from lung cancer although they did not smoke. [place] and her maternal grandfather was a Ecologist. There is no history of breast or ovarian cancer in the family to her knowledge  GYNECOLOGIC HISTORY:  No LMP recorded. Patient is not currently having periods (Reason: Chemotherapy). Menarche age 67, first live birth age 40. The patient is GX P3. [name] having regular periods. She uses the Essure device for contraception.  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 Her husband [name] is a Freight forwarder. Their daughter [name] lives in [place] where she works in Press photographer. Daughter Summer is a Research scientist (physical sciences) and lives with the patient.. Daughter [name] also at home is 64 years old.    ADVANCED DIRECTIVES: Not in place   HEALTH MAINTENANCE: Social History  Substance Use Topics  . Smoking status: Never Smoker   . Smokeless tobacco: Never Used  . Alcohol Use: Yes     Colonoscopy:  PAP:  Bone density:  Lipid panel:  Allergies  Allergen Reactions  . Adhesive [Tape] Other (See Comments)    Skin blisters    Current Outpatient  Prescriptions  Medication Sig Dispense Refill  . albuterol (PROVENTIL HFA;VENTOLIN HFA) 108 (90 BASE) MCG/ACT inhaler Inhale 1-2 puffs into the lungs every 6 (six) hours as needed for wheezing or shortness of breath. (Patient not taking: Reported on 07/11/2015) 1 Inhaler 1  . Alum & Mag Hydroxide-Simeth (MAGIC MOUTHWASH W/LIDOCAINE) SOLN Take 5 mLs by mouth 4 (four) times daily as needed for mouth pain (swish, swallow or spit). (Patient not taking: Reported on 07/04/2015) 240 mL 1  . famotidine (PEPCID) 20 MG tablet Take 1 tablet (20 mg total) by mouth 2 (two) times daily. (Patient taking differently: Take 20 mg by mouth daily. ) 60 tablet 6  . HYDROcodone-homatropine (HYCODAN) 5-1.5 MG/5ML syrup TAKE 5MLS BY MOUTH EVERY 6 HOURS AS NEEDED FOR COUGH  0  . ibuprofen (ADVIL,MOTRIN) 400 MG tablet Take 400 mg by mouth every 6 (six) hours as needed.    . lidocaine-prilocaine (EMLA) cream Apply to affected area once 30 g 3  . LORazepam (ATIVAN) 0.5 MG tablet Take 0.5 mg by mouth at bedtime.  0  . ondansetron (ZOFRAN) 8 MG tablet Take 1 tablet (8 mg total) by mouth 2 (two) times daily. Start the day after chemo for 2 days. Then take as needed for nausea or vomiting. (Patient not taking: Reported on 07/04/2015) 30 tablet 1  .  prochlorperazine (COMPAZINE) 10 MG tablet Take 1 tablet (10 mg total) by mouth every 6 (six) hours as needed (Nausea or vomiting). (Patient not taking: Reported on 07/04/2015) 30 tablet 1   No current facility-administered medications for this visit.    OBJECTIVE: There-appearing white woman in no acute distress Filed Vitals:   07/18/15 1335  BP: 123/88  Pulse: 103  Temp: 98.6 F (37 C)  Resp: 18     Body mass index is 40.08 kg/(m^2).    ECOG FS:0 - Asymptomatic  Skin: warm, dry  HEENT: sclerae anicteric, conjunctivae pink, oropharynx clear. No thrush or mucositis.  Lymph Nodes: No cervical or supraclavicular lymphadenopathy  Lungs: clear to auscultation bilaterally, no rales,  wheezes, or rhonci  Heart: regular rate and rhythm  Abdomen: round, soft, non tender, positive bowel sounds  Musculoskeletal: No focal spinal tenderness, no peripheral edema  Neuro: non focal, well oriented, positive affect  Breasts: deferred  LAB RESULTS:  CMP     Component Value Date/Time   NA 140 07/11/2015 1319   K 4.2 07/11/2015 1319   CO2 22 07/11/2015 1319   GLUCOSE 163* 07/11/2015 1319   BUN 7.1 07/11/2015 1319   CREATININE 0.8 07/11/2015 1319   CALCIUM 9.2 07/11/2015 1319   PROT 6.5 07/11/2015 1319   ALBUMIN 3.5 07/11/2015 1319   AST 19 07/11/2015 1319   ALT 25 07/11/2015 1319   ALKPHOS 63 07/11/2015 1319   BILITOT 0.52 07/11/2015 1319    INo results found for: SPEP, UPEP  Lab Results  Component Value Date   WBC 7.2 07/18/2015   NEUTROABS 4.8 07/18/2015   HGB 10.0* 07/18/2015   HCT 30.2* 07/18/2015   MCV 99.3 07/18/2015   PLT 328 07/18/2015      Chemistry      Component Value Date/Time   NA 140 07/11/2015 1319   K 4.2 07/11/2015 1319   CO2 22 07/11/2015 1319   BUN 7.1 07/11/2015 1319   CREATININE 0.8 07/11/2015 1319      Component Value Date/Time   CALCIUM 9.2 07/11/2015 1319   ALKPHOS 63 07/11/2015 1319   AST 19 07/11/2015 1319   ALT 25 07/11/2015 1319   BILITOT 0.52 07/11/2015 1319       No results found for: LABCA2  No components found for: LABCA125  No results for input(s): INR in the last 168 hours.  Urinalysis    Component Value Date/Time   LABSPEC 1.020 05/30/2015 1502   PHURINE 6.0 05/30/2015 1502   GLUCOSEU Negative 05/30/2015 1502   HGBUR Small 05/30/2015 1502   BILIRUBINUR Negative 05/30/2015 1502   KETONESUR Negative 05/30/2015 1502   PROTEINUR Negative 05/30/2015 1502   UROBILINOGEN 0.2 05/30/2015 1502   NITRITE Negative 05/30/2015 1502   LEUKOCYTESUR Negative 05/30/2015 1502    STUDIES: Dg Chest 2 View  06/27/2015   CLINICAL DATA:  Chronic cough for 2 months. Patient recently completed four chemotherapy cycles for  breast cancer.  EXAM: CHEST  2 VIEW  COMPARISON:  04/25/2015  FINDINGS: Injectable port from right internal jugular approach is seen with tip projecting at the expected location of the cavoatrial junction. The heart size and mediastinal contours are within normal limits. There is a linear opacity within the right lung at the expected location of the right middle lobe. Its shape suggests atelectasis versus perifissural fluid collection. There is no evidence of pleural effusion or pneumothorax. There is increased density of bilateral hilar regions. The visualized skeletal structures and soft tissues are unremarkable.  IMPRESSION:  Linear parenchymal opacity likely within the right middle lobe. Differential diagnosis includes atelectasis versus perifissural fluid collection.  Increased density of bilateral hilar regions. Query lymphadenopathy or peribronchial thickening.   Electronically Signed   By: Fidela Salisbury M.D.   On: 06/27/2015 14:24    ASSESSMENT: 39 y.o. [place], [place] woman status post left breast biopsy 02/08/2015 for ductal carcinoma in situ, grade 2 or 3, strongly estrogen and progesterone receptor positive, with likely areas of microinvasion  (a) biopsy of an area in the left breast upper outer quadrant showed PASH  (b) biopsy of 2 additional questionable areas, one in each breast, showed bilateral fibroadenomas  (1) genetics testing March 2016 through the BreastNext gene panel offered by [employer] showed no mutations in the following 17 genes: ATM, BARD1, BRCA1, BRCA2, BRIP1, CDH1, CHEK2, MRE11A, MUTYH, NBN, NF1, PALB2, PTEN, RAD50, RAD51C, RAD51D, and TP53.  (2) status post left lumpectomy with sentinel lymph node sampling 03/27/2015 for a pT1c pN0, stage IA invasive ductal carcinoma, grade 3, triple negative, with an MIB-1 of 33%  (a) close margins were cleared with subsequent excision 04/05/2015.  (3) Oncotype DX score of 38 predicts a risk of 26% outside the breast recurrence  within 10 years if the patient's only systemic treatment is tamoxifen for 5 years  (4) adjuvant chemotherapy started 05/02/2015 consisting of doxorubicin and cyclophosphamide in dose dense fashion 4, completed 06/13/2015, followed by paclitaxel weekly 12.  (5) adjuvant radiation to follow chemotherapy  (5) tamoxifen started 02/20/2015 (neoadjuvantly), discontinued 04/19/2015 so as not to overlap chemotherapy; to be resumed at the completion of radiation  PLAN: Unfortunately Cressida's neuropathy symptoms are of a growing concern. Due to their persistent nature and loss of fine motor skills, we are going to hold treatment today, and reassess next week. We may be able to give a few more cycles of this drug with time, but it seems unlikely that she will be able to complete all 12 weeks of treatment given how early her neuropathy has presented itself.   Cheryl [name] will return in 1 week for labs and cycle 3 of treatment. She understands and agrees with this plan. She knows the goal of treatment in her case is cure. She has been encouraged to call with any issues that might arise before her next visit here.   Laurie Panda, NP   07/18/2015 1:48 PM

## 2015-07-19 ENCOUNTER — Ambulatory Visit: Payer: Federal, State, Local not specified - PPO | Admitting: Nurse Practitioner

## 2015-07-19 ENCOUNTER — Ambulatory Visit: Payer: Federal, State, Local not specified - PPO

## 2015-07-19 ENCOUNTER — Other Ambulatory Visit: Payer: Federal, State, Local not specified - PPO

## 2015-07-23 ENCOUNTER — Encounter: Payer: Self-pay | Admitting: Nurse Practitioner

## 2015-07-25 ENCOUNTER — Other Ambulatory Visit (HOSPITAL_BASED_OUTPATIENT_CLINIC_OR_DEPARTMENT_OTHER): Payer: Federal, State, Local not specified - PPO

## 2015-07-25 ENCOUNTER — Ambulatory Visit (HOSPITAL_BASED_OUTPATIENT_CLINIC_OR_DEPARTMENT_OTHER): Payer: Federal, State, Local not specified - PPO | Admitting: Nurse Practitioner

## 2015-07-25 ENCOUNTER — Encounter: Payer: Self-pay | Admitting: Nurse Practitioner

## 2015-07-25 ENCOUNTER — Ambulatory Visit: Payer: Federal, State, Local not specified - PPO

## 2015-07-25 VITALS — BP 134/88 | HR 101 | Temp 98.6°F | Resp 18 | Ht 62.0 in | Wt 221.4 lb

## 2015-07-25 DIAGNOSIS — C50412 Malignant neoplasm of upper-outer quadrant of left female breast: Secondary | ICD-10-CM

## 2015-07-25 DIAGNOSIS — D0512 Intraductal carcinoma in situ of left breast: Secondary | ICD-10-CM

## 2015-07-25 DIAGNOSIS — G620 Drug-induced polyneuropathy: Secondary | ICD-10-CM

## 2015-07-25 DIAGNOSIS — T451X5A Adverse effect of antineoplastic and immunosuppressive drugs, initial encounter: Secondary | ICD-10-CM

## 2015-07-25 LAB — CBC WITH DIFFERENTIAL/PLATELET
BASO%: 0.8 % (ref 0.0–2.0)
BASOS ABS: 0.1 10*3/uL (ref 0.0–0.1)
EOS%: 2.5 % (ref 0.0–7.0)
Eosinophils Absolute: 0.2 10*3/uL (ref 0.0–0.5)
HEMATOCRIT: 32.5 % — AB (ref 34.8–46.6)
HGB: 10.7 g/dL — ABNORMAL LOW (ref 11.6–15.9)
LYMPH#: 1.3 10*3/uL (ref 0.9–3.3)
LYMPH%: 18.2 % (ref 14.0–49.7)
MCH: 32.4 pg (ref 25.1–34.0)
MCHC: 32.9 g/dL (ref 31.5–36.0)
MCV: 98.5 fL (ref 79.5–101.0)
MONO#: 0.5 10*3/uL (ref 0.1–0.9)
MONO%: 7.5 % (ref 0.0–14.0)
NEUT#: 5.1 10*3/uL (ref 1.5–6.5)
NEUT%: 71 % (ref 38.4–76.8)
Platelets: 273 10*3/uL (ref 145–400)
RBC: 3.3 10*6/uL — AB (ref 3.70–5.45)
RDW: 16.6 % — ABNORMAL HIGH (ref 11.2–14.5)
WBC: 7.2 10*3/uL (ref 3.9–10.3)

## 2015-07-25 LAB — COMPREHENSIVE METABOLIC PANEL (CC13)
ALT: 32 U/L (ref 0–55)
AST: 28 U/L (ref 5–34)
Albumin: 3.7 g/dL (ref 3.5–5.0)
Alkaline Phosphatase: 63 U/L (ref 40–150)
Anion Gap: 12 mEq/L — ABNORMAL HIGH (ref 3–11)
BUN: 9.2 mg/dL (ref 7.0–26.0)
CALCIUM: 9.6 mg/dL (ref 8.4–10.4)
CHLORIDE: 106 meq/L (ref 98–109)
CO2: 24 meq/L (ref 22–29)
Creatinine: 0.8 mg/dL (ref 0.6–1.1)
EGFR: >90 mL/min/{1.73_m2} (ref >90)
Glucose: 130 mg/dl (ref 70–140)
POTASSIUM: 3.6 meq/L (ref 3.5–5.1)
Sodium: 142 mEq/L (ref 136–145)
Total Bilirubin: 0.76 mg/dL (ref 0.20–1.20)
Total Protein: 6.8 g/dL (ref 6.4–8.3)

## 2015-07-25 NOTE — Progress Notes (Signed)
[facility]  Telephone:([phone]) [phone] [phone]     ID: Cheryl [name] DOB: May 30, 1976  MR#: [id number]  FSE#:[id number]  Patient Care Team: No Pcp Per Patient as PCP - General (General Practice) [name], MD as Consulting Physician (Obstetrics and Gynecology) [name], MD as Consulting Physician (General Surgery) [name], MD as Consulting Physician (Oncology) [name], MD as Consulting Physician (Radiation Oncology) [name], RN as Registered Nurse [name], RN as Registered Nurse [name], NP as Nurse Practitioner (Nurse Practitioner) PCP: No PCP Per Patient OTHER MD:  CHIEF COMPLAINT: Left breast cancer  CURRENT TREATMENT: Adjuvant chemotherapy  BREAST CANCER HISTORY: From the original intake note:  The patient had screening mammography 02/01/2015 which showed a calcifications in the upper outer quadrant of the left breast. On 02/08/2015 she underwent left diagnostic mammography with tomosynthesis and left breast ultrasonography at the breast Center. The breast density was category C. Mammography confirmed a group of pleomorphic calcifications spanning 4.2 cm maximally. Physical examination of the upper outer quadrant of the left breast showed an area of thickening at the approximate 2:00 position. Ultrasound of this area showed no discrete masses. There was vague shadowing present. Calcifications could not be clearly identified sonographically. There was no lymphadenopathy in the left axilla.  On the same day the patient underwent biopsy of the left breast area in question, with the pathology (S [id number]) showing ductal carcinoma in situ, grade 2 or 3, with possible areas of microinvasion, the cancer cells being strongly estrogen and progesterone receptor positive, both at 100%.  On 02/15/2015 the patient underwent bilateral breast MRI. This showed the area of malignancy in the upper outer quadrant to  measure 4.5 cm, including a large area of non-masslike enhancement and a 1 cm spiculated mass. There was also an indeterminate oval mass in the central left breast and another indeterminant mass in the slightly outer right breast anteriorly. Biopsy of the right breast mass 02/18/2015 ([id number]) showed a fibroadenoma, with no evidence of malignancy. Biopsy of 2 additional areas of the left breast (at 10:00 and 2:00) showed a fibroadenoma in the upper inner quadrant, and pseudo-angiomatous stromal hyperplasia (PA SH) at the 2:00 area of the left breast.  The patient's subsequent history is as detailed below.  INTERVAL HISTORY:  Cheryl [name] returns today for follow-up of her triple negative invasive breast cancer. Today she is due for cycle 3 of 12 planned weekly doses of paclitaxel. Last week's dose was held because of persistent numbness to her fingertips and loss of fine motor skills. This loss of sensation is no better today.  REVIEW OF SYSTEMS: Cheryl [name] presents no other complaints today, and feels refreshed after having a week off from chemo. She denies fevers, chills, nausea, vomiting, or changes in bowel or bladder habits. She denies fevers, chills, nausea, vomiting, or changes in bowel or bladder habits. She has no mouth sores or rashes. [name] eating an drinking well. Her energy level is decent. A detailed review of systems is otherwise stable.  PAST MEDICAL HISTORY: Past Medical History  Diagnosis Date  . Wears contact lenses   . Breast cancer     left breast    PAST SURGICAL HISTORY: Past Surgical History  Procedure Laterality Date  . Breast lumpectomy with needle localization and axillary sentinel lymph node bx Left 03/27/2015    Procedure: LEFT BREAST LUMPECTOMY WITH BRACKETED NEEDLE LOCALIZATION AND LEFT  AXILLARY SENTINEL LYMPH NODE BX;  Surgeon: [name], MD;  Location: [facility];  Service: General;  Laterality: Left;  . Breast surgery    . Re-excision of  breast lumpectomy Left 04/05/2015    Procedure: RE-EXCISION LEFT  BREAST LUMPECTOMY;  Surgeon: [name], MD;  Location: [facility];  Service: General;  Laterality: Left;  . Portacath placement Right 04/25/2015    Procedure: INSERTION PORT-A-CATH;  Surgeon: [name], MD;  Location: [facility];  Service: General;  Laterality: Right;    FAMILY HISTORY Family History  Problem Relation Age of Onset  . Lung cancer Maternal Grandmother 80    non smoker  . Lung cancer Maternal Grandfather 24    non smoker worked in [employer]  . Cancer Paternal Grandfather 35    throat cancer ? smoker  . Cancer Cousin 41    throat cancer ? smoker   the patient's parents are living, in their mid-50s as of March 2016. The patient had no siblings. Both the patient's mother's parents died from lung cancer although they did not smoke. [place] and her maternal grandfather was a Ecologist. There is no history of breast or ovarian cancer in the family to her knowledge  GYNECOLOGIC HISTORY:  No LMP recorded. Patient is not currently having periods (Reason: Chemotherapy). Menarche age 74, first live birth age 31. The patient is GX P3. [name] having regular periods. She uses the Essure device for contraception.  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 Her husband [name] is a Freight forwarder. Their daughter [name] lives in [place] where she works in Press photographer. Daughter Summer is a Research scientist (physical sciences) and lives with the patient.. Daughter [name] also at home is 29 years old.    ADVANCED DIRECTIVES: Not in place   HEALTH MAINTENANCE: Social History  Substance Use Topics  . Smoking status: Never Smoker   . Smokeless tobacco: Never Used  . Alcohol Use: Yes     Colonoscopy:  PAP:  Bone density:  Lipid panel:  Allergies  Allergen Reactions  . Adhesive [Tape] Other (See Comments)    Skin blisters    Current Outpatient  Prescriptions  Medication Sig Dispense Refill  . albuterol (PROVENTIL HFA;VENTOLIN HFA) 108 (90 BASE) MCG/ACT inhaler Inhale 1-2 puffs into the lungs every 6 (six) hours as needed for wheezing or shortness of breath. (Patient not taking: Reported on 07/11/2015) 1 Inhaler 1  . Alum & Mag Hydroxide-Simeth (MAGIC MOUTHWASH W/LIDOCAINE) SOLN Take 5 mLs by mouth 4 (four) times daily as needed for mouth pain (swish, swallow or spit). (Patient not taking: Reported on 07/04/2015) 240 mL 1  . famotidine (PEPCID) 20 MG tablet Take 1 tablet (20 mg total) by mouth 2 (two) times daily. (Patient taking differently: Take 20 mg by mouth daily. ) 60 tablet 6  . HYDROcodone-homatropine (HYCODAN) 5-1.5 MG/5ML syrup TAKE 5MLS BY MOUTH EVERY 6 HOURS AS NEEDED FOR COUGH  0  . ibuprofen (ADVIL,MOTRIN) 400 MG tablet Take 400 mg by mouth every 6 (six) hours as needed.    . lidocaine-prilocaine (EMLA) cream Apply to affected area once 30 g 3  . LORazepam (ATIVAN) 0.5 MG tablet Take 0.5 mg by mouth at bedtime.  0  . ondansetron (ZOFRAN) 8 MG tablet Take 1 tablet (8 mg total) by mouth 2 (two) times daily. Start the day after chemo for 2 days. Then take as needed for nausea or vomiting. (Patient not taking: Reported on 07/04/2015) 30 tablet 1  .  prochlorperazine (COMPAZINE) 10 MG tablet Take 1 tablet (10 mg total) by mouth every 6 (six) hours as needed (Nausea or vomiting). (Patient not taking: Reported on 07/04/2015) 30 tablet 1   No current facility-administered medications for this visit.    OBJECTIVE: There-appearing white [name] in no acute distress Filed Vitals:   07/25/15 1502  BP: 134/88  Pulse: 101  Temp: 98.6 F (37 C)  Resp: 18     Body mass index is 40.48 kg/(m^2).    ECOG FS:0 - Asymptomatic  Skin: warm, dry  HEENT: sclerae anicteric, conjunctivae pink, oropharynx clear. No thrush or mucositis.  Lymph Nodes: No cervical or supraclavicular lymphadenopathy  Lungs: clear to auscultation bilaterally, no rales,  wheezes, or rhonci  Heart: regular rate and rhythm  Abdomen: round, soft, non tender, positive bowel sounds  Musculoskeletal: No focal spinal tenderness, no peripheral edema  Neuro: non focal, well oriented, positive affect  Breasts: deferred  LAB RESULTS:  CMP     Component Value Date/Time   NA 142 07/25/2015 1444   K 3.6 07/25/2015 1444   CO2 24 07/25/2015 1444   GLUCOSE 130 07/25/2015 1444   BUN 9.2 07/25/2015 1444   CREATININE 0.8 07/25/2015 1444   CALCIUM 9.6 07/25/2015 1444   PROT 6.8 07/25/2015 1444   ALBUMIN 3.7 07/25/2015 1444   AST 28 07/25/2015 1444   ALT 32 07/25/2015 1444   ALKPHOS 63 07/25/2015 1444   BILITOT 0.76 07/25/2015 1444    INo results found for: SPEP, UPEP  Lab Results  Component Value Date   WBC 7.2 07/25/2015   NEUTROABS 5.1 07/25/2015   HGB 10.7* 07/25/2015   HCT 32.5* 07/25/2015   MCV 98.5 07/25/2015   PLT 273 07/25/2015      Chemistry      Component Value Date/Time   NA 142 07/25/2015 1444   K 3.6 07/25/2015 1444   CO2 24 07/25/2015 1444   BUN 9.2 07/25/2015 1444   CREATININE 0.8 07/25/2015 1444      Component Value Date/Time   CALCIUM 9.6 07/25/2015 1444   ALKPHOS 63 07/25/2015 1444   AST 28 07/25/2015 1444   ALT 32 07/25/2015 1444   BILITOT 0.76 07/25/2015 1444       No results found for: LABCA2  No components found for: LABCA125  No results for input(s): INR in the last 168 hours.  Urinalysis    Component Value Date/Time   LABSPEC 1.020 05/30/2015 1502   PHURINE 6.0 05/30/2015 1502   GLUCOSEU Negative 05/30/2015 1502   HGBUR Small 05/30/2015 1502   BILIRUBINUR Negative 05/30/2015 1502   KETONESUR Negative 05/30/2015 1502   PROTEINUR Negative 05/30/2015 1502   UROBILINOGEN 0.2 05/30/2015 1502   NITRITE Negative 05/30/2015 1502   LEUKOCYTESUR Negative 05/30/2015 1502    STUDIES: Dg Chest 2 View  06/27/2015   CLINICAL DATA:  Chronic cough for 2 months. Patient recently completed four chemotherapy cycles for  breast cancer.  EXAM: CHEST  2 VIEW  COMPARISON:  04/25/2015  FINDINGS: Injectable port from right internal jugular approach is seen with tip projecting at the expected location of the cavoatrial junction. The heart size and mediastinal contours are within normal limits. There is a linear opacity within the right lung at the expected location of the right middle lobe. Its shape suggests atelectasis versus perifissural fluid collection. There is no evidence of pleural effusion or pneumothorax. There is increased density of bilateral hilar regions. The visualized skeletal structures and soft tissues are unremarkable.  IMPRESSION:  Linear parenchymal opacity likely within the right middle lobe. Differential diagnosis includes atelectasis versus perifissural fluid collection.  Increased density of bilateral hilar regions. Query lymphadenopathy or peribronchial thickening.   Electronically Signed   By: Fidela Salisbury M.D.   On: 06/27/2015 14:24    ASSESSMENT: 39 y.o. Cheryl [name], Cheryl [name] status post left breast biopsy 02/08/2015 for ductal carcinoma in situ, grade 2 or 3, strongly estrogen and progesterone receptor positive, with likely areas of microinvasion  (a) biopsy of an area in the left breast upper outer quadrant showed PASH  (b) biopsy of 2 additional questionable areas, one in each breast, showed bilateral fibroadenomas  (1) genetics testing March 2016 through the BreastNext gene panel offered by [employer] showed no mutations in the following 17 genes: ATM, BARD1, BRCA1, BRCA2, BRIP1, CDH1, CHEK2, MRE11A, MUTYH, NBN, NF1, PALB2, PTEN, RAD50, RAD51C, RAD51D, and TP53.  (2) status post left lumpectomy with sentinel lymph node sampling 03/27/2015 for a pT1c pN0, stage IA invasive ductal carcinoma, grade 3, triple negative, with an MIB-1 of 33%  (a) close margins were cleared with subsequent excision 04/05/2015.  (3) Oncotype DX score of 38 predicts a risk of 26% outside the breast recurrence  within 10 years if the patient's only systemic treatment is tamoxifen for 5 years  (4) adjuvant chemotherapy started 05/02/2015 consisting of doxorubicin and cyclophosphamide in dose dense fashion 4, completed 06/13/2015, followed by paclitaxel weekly 12. Paclitaxel stopped after only 2 cycles because of persistent neuropathy.  (5) adjuvant radiation to follow chemotherapy  (5) tamoxifen started 02/20/2015 (neoadjuvantly), discontinued 04/19/2015 so as not to overlap chemotherapy; to be resumed at the completion of radiation  PLAN: I consulted with Dr. Jana Hakim, and given the intense nature of her neuropathy so early in treatment, he suggested we stop the paclitaxel altogether at this point. This idea was discussed at length with the patient, and she understands.  She will proceed to radiation. And I have placed a referral to Dr. Unknown Jim office for a consultation visit.   Cheryl [name] will return in early October for follow up with Dr. Jana Hakim. She understands and agrees with this plan. She knows the goal of treatment in her case is cure. She has been encouraged to call with any issues that might arise before her next visit here.   Cheryl Panda, NP   07/25/2015 4:20 PM

## 2015-07-26 ENCOUNTER — Telehealth: Payer: Self-pay | Admitting: Oncology

## 2015-07-26 NOTE — Telephone Encounter (Signed)
Called and left a message with appointments per pof,rad onc will call the patient

## 2015-07-31 NOTE — Progress Notes (Signed)
Location of Breast Cancer:Left upper-outer  Histology per Pathology Report:04/05/15  FINAL DIAGNOSIS Diagnosis 1. Breast, excision, Left re-excision medial margin - LOBULAR NEOPLASIA (ATYPICAL LOBULAR HYPERPLASIA), SEE COMMENT. - SURGICAL MARGINS, NEGATIVE FOR ATYPIA OR MALIGNANCY. 2. Breast, excision, Left re-excision inferior margin - BENIGN BREAST TISSUE, SEE COMMENT. - NEGATIVE FOR ATYPIA OR MALIGNANCY. - SURGICAL MARGINS, NEGATIVE FOR ATYPIA OR MALIGNANCY. 3. Breast, excision, Left shaved inferior margin - BENIGN BREAST TISSUE, SEE COMMENT. - NEGATIVE FOR ATYPIA OR MALIGNANCY. - SURGICAL MARGINS, NEGATIVE FOR ATYPIA OR MALIGNANCY. Receptor Status: ER(), PR (), Her2-neu ()  Did patient present with symptoms (if so, please note symptoms) or was this found on screening mammography?: Found on screening 02/08/15.  Past/Anticipated interventions by surgeon, if any:04/05/15 RE-EXCISION LEFT BREAST LUMPECTOMY (Left Breast) 03/27/15 BREAST LUMPECTOMY WITH NEEDLE LOCALIZATION AND AXILLARY SENTINEL LYMPH NODE BX  Past/Anticipated interventions by medical oncology, if any: Chemotherapy: paclitaxel. Treatment canceled secondary to neuropathy of fingers.  Lymphedema issues, if any:  Pain issues, if any: No   SAFETY ISSUES:  Prior radiation? NO  Pacemaker/ICD? No  Possible current pregnancy?No, LMP  April 2016  Is the patient on methotrexate? No  Current Complaints / other details:Married.menarche age 33.First child age 21.GXP#. 3 daughters. No family history of breast or female cancers.  Allergies:adhesive tape  No smoking history There were no vitals taken for this visit.    Arlyss Repress, RN 07/31/2015,5:17 PM

## 2015-08-01 ENCOUNTER — Ambulatory Visit: Payer: Federal, State, Local not specified - PPO

## 2015-08-01 ENCOUNTER — Encounter: Payer: Self-pay | Admitting: Radiation Oncology

## 2015-08-01 ENCOUNTER — Ambulatory Visit
Admission: RE | Admit: 2015-08-01 | Discharge: 2015-08-01 | Disposition: A | Discharge status: Home / Self Care | Payer: Federal, State, Local not specified - PPO | Source: Ambulatory Visit | Attending: Radiation Oncology | Admitting: Radiation Oncology

## 2015-08-01 ENCOUNTER — Other Ambulatory Visit: Payer: Federal, State, Local not specified - PPO

## 2015-08-01 ENCOUNTER — Ambulatory Visit: Payer: Federal, State, Local not specified - PPO | Admitting: Nurse Practitioner

## 2015-08-01 DIAGNOSIS — Z510 Encounter for antineoplastic radiation therapy: Secondary | ICD-10-CM | POA: Insufficient documentation

## 2015-08-01 DIAGNOSIS — C50412 Malignant neoplasm of upper-outer quadrant of left female breast: Secondary | ICD-10-CM | POA: Diagnosis present

## 2015-08-01 NOTE — Addendum Note (Signed)
Encounter addended by: Norm Salt, RN on: 08/01/2015  5:14 PM<BR>     Documentation filed: Charges VN

## 2015-08-01 NOTE — Progress Notes (Signed)
Please see the Nurse Progress Note in the MD Initial Consult Encounter for this patient. 

## 2015-08-01 NOTE — Progress Notes (Signed)
Department of Radiation Oncology  Phone:  [phone] Fax:        [phone]   Name: Cheryl [name] MRN: [id number]  DOB: Sep 30, 1976  Date: 08/01/2015  Follow Up Visit Note  Diagnosis: Breast cancer of upper-outer quadrant of left female breast   Staging form: Breast, AJCC 7th Edition     Clinical: Stage 0 (Tis (DCIS), N0, M0) - Signed by [name], MD on 02/20/2015     Pathologic stage from 03/29/2015: Stage IA (T1c, N0, cM0) - Signed by Enid Cutter, MD on 04/04/2015       Staging comments: Staged on final lumpectomy specimen by Dr. Avis Epley.   Diagnosis: T1cN0 invasive ductal carcinoma of the left female breast  Summary and Interval since last chemotherapy: Last date 07/20/2015  Interval History: Cheryl [name] presents today for routine follow-up appointment with radiation oncology. She underwent a lumpectomy on 03/27/2015 which showed a 1.3cm triple negative Grade III invasive ductal carcinoma. She had extensive DCIS with multiple close margins. She had four benign lymph nodes. She underwent reexcision of margins and had negative margins ultimately. She has finished chemotherapy and has done well in regards to response and recovery thus far. Her Taxol was stopped early due to neuropathy of the finger tips. "For some reason water makes it feel even worse. It's weird, like when I'm trying to do dishes," the patient said when describing the neuropathy in her finger tips. The patient projected a healthy mental status and was no accompanied by family for today's radiation oncology appointment. [name] currently employed full time as a Therapist, art and data entry employee and was able to work full time during chemotherapy.   Physical Exam: The patient is alert and oriented x3.   IMPRESSION: Cheryl [name] is a 39 y.o. female presenting to clinic in regards to her T1cN0 invasive ductal carcinoma of the left female breast now ready for radiation.   PLAN: I spoke to the patient today  regarding her diagnosis and options for treatment. We discussed the equivalence in terms of survival and local failure between mastectomy and breast conservation. We discussed the role of radiation in decreasing local failures in patients who undergo lumpectomy. We discussed the process of simulation and the placement two tattoos to ensure the most accurate treatment possible. We discussed 4-6 weeks of treatment as an outpatient. We discussed the possibility of asymptomatic lung damage. We discussed the low likelihood of secondary malignancies. We discussed the possible side effects including but not limited to skin redness, fatigue, permanent skin darkening, and breast swelling.  We discussed the use of cardiac sparing with deep inspiration breath hold if needed. The breath hold technique process, purpose, and benefits was explained in detail, due to the fact that we are treating her left breast. The patient was educated on the benefits of technology used to address her disease. Common symptoms that patient's may experienced during radiation therapy were described and healthy methods of management if these symptoms to occur were reviewed. The patient was concerned that she would "get burned." This concern was addressed and she was made aware of the common side effect that may cause her skin to feel effects similar to a sun burn. The patient was made aware of the preventative lotion will be provided to address this symptom. [name] advised of her CT scan and simulation planning session to take place on Thursday, September 15th of 2016 at 2:00pm. [name] advised of her routine radiation oncology follow-up appointments to take place as  scheduled. She has been sent home with education resources that she can access via the Internet in regards to her treatment. She has also been advised of the appropriate use of MyChart in regards to viewing her appointment times. All questions and concerns vocalized have been fully  addressed. If the patient develops any further questions or concerns in regards to her treatment, she has been encouraged to contact Dr. Pablo Ledger. Consent documentation was reviewed and signed.  This document serves as a record of services personally performed by [name] , MD. It was created on her behalf by Lenn Cal, a trained medical scribe. The creation of this record is based on the scribe's personal observations and the provider's statements to them. This document has been checked and approved by the attending provider.   ------------------------------------------------  [name], MD

## 2015-08-08 ENCOUNTER — Other Ambulatory Visit: Payer: Federal, State, Local not specified - PPO

## 2015-08-08 ENCOUNTER — Ambulatory Visit
Admission: RE | Admit: 2015-08-08 | Discharge: 2015-08-08 | Disposition: A | Discharge status: Home / Self Care | Payer: Federal, State, Local not specified - PPO | Source: Ambulatory Visit | Attending: Radiation Oncology | Admitting: Radiation Oncology

## 2015-08-08 ENCOUNTER — Ambulatory Visit: Payer: Federal, State, Local not specified - PPO

## 2015-08-08 ENCOUNTER — Ambulatory Visit: Payer: Federal, State, Local not specified - PPO | Admitting: Nurse Practitioner

## 2015-08-08 DIAGNOSIS — Z510 Encounter for antineoplastic radiation therapy: Secondary | ICD-10-CM | POA: Diagnosis not present

## 2015-08-08 DIAGNOSIS — C50412 Malignant neoplasm of upper-outer quadrant of left female breast: Secondary | ICD-10-CM

## 2015-08-08 NOTE — Progress Notes (Signed)
Name: Cheryl [name]   MRN: [id number]  Date:  08/08/2015  DOB: [birth year]  Status:outpatient   DIAGNOSIS: Left Breast cancer.  CONSENT VERIFIED: yes SET UP: Patient is setup supine  IMMOBILIZATION:  The following immobilization was used:Custom Moldable Pillow, breast board.  NARRATIVE: Cheryl [name] was brought to the [facility].  Identity was confirmed.  All relevant records and images related to the planned course of therapy were reviewed.  Then, the patient was positioned in a stable reproducible clinical set-up for radiation therapy.  Wires were placed to delineate the clinical extent of breast tissue. A wire was placed on the scar as well.  CT images were obtained.  An isocenter was placed. Skin markings were placed.  The position of the heart was then analyzed.  Due to the proximity of the heart to the chest wall, I felt she would benefit from deep inspiration breath hold for cardiac sparing.  She was then coached and rescanned in the breath hold position.  Acceptable cardiac sparing was achieved. The CT images were loaded into the planning software where the target and avoidance structures were contoured.  The radiation prescription was entered and confirmed. The patient was discharged in stable condition and tolerated simulation well.    TREATMENT PLANNING NOTE/3D Simulation Note Treatment planning then occurred. I have requested : MLC's, isodose plan, basic dose calculation  3D simulation was performed.  I personally designed and supervised the construction of 3 medically necessary complex treatment devices in the form of MLCs which will be used for beam modification and to protect critical structures including the heart and lung as well as the immobilization device which is necessary for reproducible set up.  I have requested a dose volume histogram of the heart, lung and tumor cavity.   Special treatment procedure was performed today due to the extra time and effort  required by myself to plan and prepare this patient for deep inspiration breath hold technique.  I have determined cardiac sparing to be of benefit to this patient to prevent long term cardiac damage due to radiation of the heart.  Bellows were placed on the patient's abdomen. To facilitate cardiac sparing, the patient was coached by the radiation therapists on breath hold techniques and breathing practice was performed. Practice waveforms were obtained. The patient was then scanned while maintaining breath hold in the treatment position.  This image was then transferred over to the imaging specialist. The imaging specialist then created a fusion of the free breathing and breath hold scans using the chest wall as the stable structure. I personally reviewed the fusion in axial, coronal and sagittal image planes.  Excellent cardiac sparing was obtained.  I felt the patient is an appropriate candidate for breath hold and the patient will be treated as such.  The image fusion was then reviewed with the patient to reinforce the necessity of reproducible breath hold.

## 2015-08-09 ENCOUNTER — Other Ambulatory Visit: Payer: Self-pay | Admitting: Nurse Practitioner

## 2015-08-14 DIAGNOSIS — Z510 Encounter for antineoplastic radiation therapy: Secondary | ICD-10-CM | POA: Diagnosis not present

## 2015-08-15 ENCOUNTER — Ambulatory Visit: Payer: Federal, State, Local not specified - PPO

## 2015-08-15 ENCOUNTER — Ambulatory Visit
Admission: RE | Admit: 2015-08-15 | Discharge: 2015-08-15 | Disposition: A | Discharge status: Home / Self Care | Payer: Federal, State, Local not specified - PPO | Source: Ambulatory Visit | Attending: Radiation Oncology | Admitting: Radiation Oncology

## 2015-08-15 ENCOUNTER — Ambulatory Visit: Payer: Federal, State, Local not specified - PPO | Admitting: Nurse Practitioner

## 2015-08-15 ENCOUNTER — Other Ambulatory Visit: Payer: Federal, State, Local not specified - PPO

## 2015-08-15 DIAGNOSIS — Z510 Encounter for antineoplastic radiation therapy: Secondary | ICD-10-CM | POA: Diagnosis not present

## 2015-08-19 ENCOUNTER — Ambulatory Visit
Admission: RE | Admit: 2015-08-19 | Discharge: 2015-08-19 | Disposition: A | Discharge status: Home / Self Care | Payer: Federal, State, Local not specified - PPO | Source: Ambulatory Visit | Attending: Radiation Oncology | Admitting: Radiation Oncology

## 2015-08-19 ENCOUNTER — Ambulatory Visit: Payer: Federal, State, Local not specified - PPO | Attending: Radiation Oncology

## 2015-08-19 ENCOUNTER — Telehealth: Payer: Self-pay | Admitting: Oncology

## 2015-08-19 DIAGNOSIS — Z510 Encounter for antineoplastic radiation therapy: Secondary | ICD-10-CM | POA: Diagnosis not present

## 2015-08-19 NOTE — Telephone Encounter (Signed)
Left message to confirm appointment change due to MD leaving early. Moved from 10/05 to 10/14. Patient uses MyChart

## 2015-08-20 ENCOUNTER — Ambulatory Visit
Admission: RE | Admit: 2015-08-20 | Discharge: 2015-08-20 | Disposition: A | Discharge status: Home / Self Care | Payer: Federal, State, Local not specified - PPO | Source: Ambulatory Visit | Attending: Radiation Oncology | Admitting: Radiation Oncology

## 2015-08-20 DIAGNOSIS — Z510 Encounter for antineoplastic radiation therapy: Secondary | ICD-10-CM | POA: Diagnosis not present

## 2015-08-21 ENCOUNTER — Ambulatory Visit
Admission: RE | Admit: 2015-08-21 | Discharge: 2015-08-21 | Disposition: A | Discharge status: Home / Self Care | Payer: Federal, State, Local not specified - PPO | Source: Ambulatory Visit | Attending: Radiation Oncology | Admitting: Radiation Oncology

## 2015-08-21 VITALS — BP 129/74 | HR 100 | Temp 98.4°F | Wt 217.0 lb

## 2015-08-21 DIAGNOSIS — C50412 Malignant neoplasm of upper-outer quadrant of left female breast: Secondary | ICD-10-CM | POA: Diagnosis not present

## 2015-08-21 DIAGNOSIS — Z510 Encounter for antineoplastic radiation therapy: Secondary | ICD-10-CM | POA: Diagnosis not present

## 2015-08-21 MED ORDER — RADIAPLEXRX EX GEL
Freq: Once | CUTANEOUS | Status: AC
  Administered 2015-08-21: 18:00:00 via TOPICAL

## 2015-08-21 MED ORDER — ALRA NON-METALLIC DEODORANT (RAD-ONC)
1.0000 "application " | Freq: Once | TOPICAL | Status: AC
  Administered 2015-08-21: 1 via TOPICAL

## 2015-08-21 NOTE — Progress Notes (Signed)
Weekly Management Note Current Dose: 5.4  Gy  Projected Dose: 61 Gy   Narrative:  The patient presents for routine under treatment assessment.  CBCT/MVCT images/Port film x-rays were reviewed.  The chart was checked. Doing well. No complaints.   Physical Findings: Weight:  . Unchanged  Impression:  The patient is tolerating radiation.  Plan:  Continue treatment as planned. Continue radiaplex. Rn education performed.

## 2015-08-21 NOTE — Addendum Note (Signed)
Encounter addended by: Norm Salt, RN on: 08/21/2015  6:23 PM<BR>     Documentation filed: Notes Section

## 2015-08-21 NOTE — Addendum Note (Signed)
Encounter addended by: Norm Salt, RN on: 08/21/2015  5:52 PM<BR>     Documentation filed: Inpatient MAR

## 2015-08-21 NOTE — Addendum Note (Signed)
Encounter addended by: Norm Salt, RN on: 08/21/2015  6:16 PM<BR>     Documentation filed: Inpatient Patient Education

## 2015-08-21 NOTE — Progress Notes (Signed)
Pt here for patient teaching.  Pt given Radiation and You booklet, skin care instructions, Alra deodorant and Radiaplex gel. Pt reports they have not watched the Radiation Therapy Education video on August 21, 2015.  Reviewed areas of pertinence such as fatigue, hair loss, skin changes, breast tenderness, breast swelling, cough, shortness of breath, earaches and taste changes . Pt able to give teach back of to pat skin, use unscented/gentle soap and drink plenty of water,apply Radiaplex bid, avoid applying anything to skin within 4 hours of treatment, avoid wearing an under wire bra and to use an electric razor if they must shave. Pt refused teaching of information given and will contact nursing with any questions or concerns.

## 2015-08-22 ENCOUNTER — Other Ambulatory Visit: Payer: Federal, State, Local not specified - PPO

## 2015-08-22 ENCOUNTER — Ambulatory Visit: Payer: Federal, State, Local not specified - PPO

## 2015-08-22 ENCOUNTER — Ambulatory Visit
Admission: RE | Admit: 2015-08-22 | Discharge: 2015-08-22 | Disposition: A | Discharge status: Home / Self Care | Payer: Federal, State, Local not specified - PPO | Source: Ambulatory Visit | Attending: Radiation Oncology | Admitting: Radiation Oncology

## 2015-08-22 ENCOUNTER — Ambulatory Visit: Payer: Federal, State, Local not specified - PPO | Admitting: Nurse Practitioner

## 2015-08-22 DIAGNOSIS — Z510 Encounter for antineoplastic radiation therapy: Secondary | ICD-10-CM | POA: Diagnosis not present

## 2015-08-23 ENCOUNTER — Ambulatory Visit
Admission: RE | Admit: 2015-08-23 | Discharge: 2015-08-23 | Disposition: A | Discharge status: Home / Self Care | Payer: Federal, State, Local not specified - PPO | Source: Ambulatory Visit | Attending: Radiation Oncology | Admitting: Radiation Oncology

## 2015-08-23 DIAGNOSIS — Z510 Encounter for antineoplastic radiation therapy: Secondary | ICD-10-CM | POA: Diagnosis not present

## 2015-08-26 ENCOUNTER — Ambulatory Visit
Admission: RE | Admit: 2015-08-26 | Discharge: 2015-08-26 | Disposition: A | Discharge status: Home / Self Care | Payer: Federal, State, Local not specified - PPO | Source: Ambulatory Visit | Attending: Radiation Oncology | Admitting: Radiation Oncology

## 2015-08-26 ENCOUNTER — Telehealth: Payer: Self-pay | Admitting: *Deleted

## 2015-08-26 DIAGNOSIS — Z510 Encounter for antineoplastic radiation therapy: Secondary | ICD-10-CM | POA: Diagnosis not present

## 2015-08-26 NOTE — Telephone Encounter (Signed)
Left vm for pt to return call regarding needs during xrt. 

## 2015-08-27 ENCOUNTER — Ambulatory Visit
Admission: RE | Admit: 2015-08-27 | Discharge: 2015-08-27 | Disposition: A | Discharge status: Home / Self Care | Payer: Federal, State, Local not specified - PPO | Source: Ambulatory Visit | Attending: Radiation Oncology | Admitting: Radiation Oncology

## 2015-08-27 VITALS — BP 129/73 | HR 86 | Temp 98.3°F | Ht 62.0 in | Wt 220.4 lb

## 2015-08-27 DIAGNOSIS — C50412 Malignant neoplasm of upper-outer quadrant of left female breast: Secondary | ICD-10-CM

## 2015-08-27 DIAGNOSIS — Z510 Encounter for antineoplastic radiation therapy: Secondary | ICD-10-CM | POA: Diagnosis not present

## 2015-08-27 NOTE — Progress Notes (Signed)
Cheryl [name] is here for her 7th fraction of radiation to her left breast. She reports very little fatigue, denies pain. She denies any pain, itching to her left breast, and no skin changes are noted at this time.  BP 129/73 mmHg  Pulse 86  Temp(Src) 98.3 F (36.8 C)  Ht 5\' 2"  (1.575 m)  Wt 220 lb 6.4 oz (99.973 kg)  BMI 40.30 kg/m2  Wt Readings from Last 3 Encounters:  08/27/15 220 lb 6.4 oz (99.973 kg)  08/21/15 217 lb (98.431 kg)  07/25/15 221 lb 6.4 oz (100.426 kg)

## 2015-08-27 NOTE — Progress Notes (Signed)
Weekly Management Note Current Dose: 12.6  Gy  Projected Dose: 61 Gy   Narrative:  The patient presents for routine under treatment assessment.  CBCT/MVCT images/Port film x-rays were reviewed.  The chart was checked. [name] is here for her 7th fraction of radiation to her left breast. She has no complaints at this time. She reports very little fatigue. She denies pain, itching to there left breast, or skin changes.   Physical Findings: Weight: 220 lb 6.4 oz (99.973 kg). Unchanged. No skin changes at this time.  Impression:  The patient is tolerating radiation.  Plan:  Continue treatment as planned. Continue radiaplex.  This document serves as a record of services personally performed by [name], MD. It was created on her behalf by Darcus Austin, a trained medical scribe. The creation of this record is based on the scribe's personal observations and the provider's statements to them. This document has been checked and approved by the attending provider.

## 2015-08-28 ENCOUNTER — Ambulatory Visit: Payer: Federal, State, Local not specified - PPO | Admitting: Oncology

## 2015-08-28 ENCOUNTER — Other Ambulatory Visit: Payer: Federal, State, Local not specified - PPO

## 2015-08-28 ENCOUNTER — Ambulatory Visit
Admission: RE | Admit: 2015-08-28 | Discharge: 2015-08-28 | Disposition: A | Discharge status: Home / Self Care | Payer: Federal, State, Local not specified - PPO | Source: Ambulatory Visit | Attending: Radiation Oncology | Admitting: Radiation Oncology

## 2015-08-28 DIAGNOSIS — Z510 Encounter for antineoplastic radiation therapy: Secondary | ICD-10-CM | POA: Diagnosis not present

## 2015-08-29 ENCOUNTER — Ambulatory Visit
Admission: RE | Admit: 2015-08-29 | Discharge: 2015-08-29 | Disposition: A | Discharge status: Home / Self Care | Payer: Federal, State, Local not specified - PPO | Source: Ambulatory Visit | Attending: Radiation Oncology | Admitting: Radiation Oncology

## 2015-08-29 DIAGNOSIS — Z510 Encounter for antineoplastic radiation therapy: Secondary | ICD-10-CM | POA: Diagnosis not present

## 2015-08-30 ENCOUNTER — Ambulatory Visit
Admission: RE | Admit: 2015-08-30 | Discharge: 2015-08-30 | Disposition: A | Discharge status: Home / Self Care | Payer: Federal, State, Local not specified - PPO | Source: Ambulatory Visit | Attending: Radiation Oncology | Admitting: Radiation Oncology

## 2015-08-30 DIAGNOSIS — Z510 Encounter for antineoplastic radiation therapy: Secondary | ICD-10-CM | POA: Diagnosis not present

## 2015-09-02 ENCOUNTER — Ambulatory Visit
Admission: RE | Admit: 2015-09-02 | Discharge: 2015-09-02 | Disposition: A | Discharge status: Home / Self Care | Payer: Federal, State, Local not specified - PPO | Source: Ambulatory Visit | Attending: Radiation Oncology | Admitting: Radiation Oncology

## 2015-09-02 DIAGNOSIS — Z510 Encounter for antineoplastic radiation therapy: Secondary | ICD-10-CM | POA: Diagnosis not present

## 2015-09-03 ENCOUNTER — Ambulatory Visit
Admission: RE | Admit: 2015-09-03 | Discharge: 2015-09-03 | Disposition: A | Discharge status: Home / Self Care | Payer: Federal, State, Local not specified - PPO | Source: Ambulatory Visit | Attending: Radiation Oncology | Admitting: Radiation Oncology

## 2015-09-03 ENCOUNTER — Encounter: Payer: Self-pay | Admitting: Radiation Oncology

## 2015-09-03 VITALS — BP 134/88 | HR 86 | Temp 98.2°F | Ht 62.0 in | Wt 219.0 lb

## 2015-09-03 DIAGNOSIS — C50412 Malignant neoplasm of upper-outer quadrant of left female breast: Secondary | ICD-10-CM

## 2015-09-03 DIAGNOSIS — Z510 Encounter for antineoplastic radiation therapy: Secondary | ICD-10-CM | POA: Diagnosis not present

## 2015-09-03 NOTE — Progress Notes (Signed)
[name] is here for her 12/33 fractions to her left breast. 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 Her anterior left breast is slightly red, she reports mild itching at times, and does report some tenderness to her scar area. [name] using the radiaplex as directed twice a day. BP 134/88 mmHg  Pulse 86  Temp(Src) 98.2 F (36.8 C)  Ht 5\' 2"  (1.575 m)  Wt 219 lb (99.338 kg)  BMI 40.05 kg/m2  Wt Readings from Last 3 Encounters:  09/03/15 219 lb (99.338 kg)  08/27/15 220 lb 6.4 oz (99.973 kg)  08/21/15 217 lb (98.431 kg)

## 2015-09-03 NOTE — Progress Notes (Signed)
Weekly Management Note Current Dose: 21.6  Gy  Projected Dose: 61 Gy   Narrative:  The patient presents for routine under treatment assessment.  CBCT/MVCT images/Port film x-rays were reviewed.  The chart was checked. Doing well. No complaints.   Physical Findings: Weight: 219 lb (99.338 kg). Unchanged. Minimal brown skin over left breast.   Impression:  The patient is tolerating radiation.  Plan:  Continue treatment as planned. Continue radiaplex.

## 2015-09-04 ENCOUNTER — Ambulatory Visit
Admission: RE | Admit: 2015-09-04 | Discharge: 2015-09-04 | Disposition: A | Discharge status: Home / Self Care | Payer: Federal, State, Local not specified - PPO | Source: Ambulatory Visit | Attending: Radiation Oncology | Admitting: Radiation Oncology

## 2015-09-04 DIAGNOSIS — Z510 Encounter for antineoplastic radiation therapy: Secondary | ICD-10-CM | POA: Diagnosis not present

## 2015-09-05 ENCOUNTER — Ambulatory Visit
Admission: RE | Admit: 2015-09-05 | Discharge: 2015-09-05 | Disposition: A | Discharge status: Home / Self Care | Payer: Federal, State, Local not specified - PPO | Source: Ambulatory Visit | Attending: Radiation Oncology | Admitting: Radiation Oncology

## 2015-09-05 DIAGNOSIS — Z510 Encounter for antineoplastic radiation therapy: Secondary | ICD-10-CM | POA: Diagnosis not present

## 2015-09-06 ENCOUNTER — Ambulatory Visit (HOSPITAL_BASED_OUTPATIENT_CLINIC_OR_DEPARTMENT_OTHER): Payer: Federal, State, Local not specified - PPO | Admitting: Oncology

## 2015-09-06 ENCOUNTER — Other Ambulatory Visit (HOSPITAL_BASED_OUTPATIENT_CLINIC_OR_DEPARTMENT_OTHER): Payer: Federal, State, Local not specified - PPO

## 2015-09-06 ENCOUNTER — Ambulatory Visit
Admission: RE | Admit: 2015-09-06 | Discharge: 2015-09-06 | Disposition: A | Discharge status: Home / Self Care | Payer: Federal, State, Local not specified - PPO | Source: Ambulatory Visit | Attending: Radiation Oncology | Admitting: Radiation Oncology

## 2015-09-06 ENCOUNTER — Telehealth: Payer: Self-pay | Admitting: Oncology

## 2015-09-06 VITALS — BP 115/74 | HR 88 | Temp 98.5°F | Resp 19 | Ht 62.0 in | Wt 218.8 lb

## 2015-09-06 DIAGNOSIS — Z510 Encounter for antineoplastic radiation therapy: Secondary | ICD-10-CM | POA: Diagnosis not present

## 2015-09-06 DIAGNOSIS — D0512 Intraductal carcinoma in situ of left breast: Secondary | ICD-10-CM

## 2015-09-06 DIAGNOSIS — C50412 Malignant neoplasm of upper-outer quadrant of left female breast: Secondary | ICD-10-CM

## 2015-09-06 LAB — COMPREHENSIVE METABOLIC PANEL (CC13)
ALBUMIN: 3.7 g/dL (ref 3.5–5.0)
ALK PHOS: 75 U/L (ref 40–150)
ALT: 31 U/L (ref 0–55)
ANION GAP: 10 meq/L (ref 3–11)
AST: 29 U/L (ref 5–34)
BILIRUBIN TOTAL: 0.47 mg/dL (ref 0.20–1.20)
BUN: 10.4 mg/dL (ref 7.0–26.0)
CALCIUM: 9.1 mg/dL (ref 8.4–10.4)
CO2: 23 meq/L (ref 22–29)
CREATININE: 0.8 mg/dL (ref 0.6–1.1)
Chloride: 108 mEq/L (ref 98–109)
EGFR: 87 mL/min/{1.73_m2} — AB (ref >90)
Glucose: 146 mg/dl — ABNORMAL HIGH (ref 70–140)
Potassium: 3.7 mEq/L (ref 3.5–5.1)
SODIUM: 140 meq/L (ref 136–145)
TOTAL PROTEIN: 6.8 g/dL (ref 6.4–8.3)

## 2015-09-06 LAB — CBC WITH DIFFERENTIAL/PLATELET
BASO%: 0.8 % (ref 0.0–2.0)
BASOS ABS: 0.1 10*3/uL (ref 0.0–0.1)
EOS%: 2.7 % (ref 0.0–7.0)
Eosinophils Absolute: 0.2 10*3/uL (ref 0.0–0.5)
HEMATOCRIT: 31.1 % — AB (ref 34.8–46.6)
HGB: 10.4 g/dL — ABNORMAL LOW (ref 11.6–15.9)
LYMPH#: 1.2 10*3/uL (ref 0.9–3.3)
LYMPH%: 17.1 % (ref 14.0–49.7)
MCH: 31.1 pg (ref 25.1–34.0)
MCHC: 33.5 g/dL (ref 31.5–36.0)
MCV: 92.6 fL (ref 79.5–101.0)
MONO#: 0.5 10*3/uL (ref 0.1–0.9)
MONO%: 6.6 % (ref 0.0–14.0)
NEUT#: 5.2 10*3/uL (ref 1.5–6.5)
NEUT%: 72.8 % (ref 38.4–76.8)
Platelets: 368 10*3/uL (ref 145–400)
RBC: 3.35 10*6/uL — AB (ref 3.70–5.45)
RDW: 13.8 % (ref 11.2–14.5)
WBC: 7.2 10*3/uL (ref 3.9–10.3)

## 2015-09-06 MED ORDER — TAMOXIFEN CITRATE 20 MG PO TABS: 20.0000 mg | ORAL_TABLET | Freq: Every day | ORAL | Status: AC

## 2015-09-06 NOTE — Progress Notes (Signed)
Cheryl [name]  [phone] Fax:([phone]) [phone]     ID: Cheryl [name] DOB: 04/05/1976  MR#: [id number]  WSF#:[id number]  Patient Care Team: No Pcp Per Patient as PCP - General (General Practice) Cheryl Compton, MD as Consulting Physician (Obstetrics and Gynecology) Cheryl Seltzer, MD as Consulting Physician (General Surgery) Cheryl Cruel, MD as Consulting Physician (Oncology) Cheryl Silversmith, MD as Consulting Physician (Radiation Oncology) Cheryl Germany, RN as Registered Nurse Cheryl Kaufmann, RN as Registered Nurse Cheryl Bouche, NP as Nurse Practitioner (Nurse Practitioner) PCP: No PCP Per Patient OTHER MD:  CHIEF COMPLAINT: Left breast cancer  CURRENT TREATMENT: Adjuvant  radiation  BREAST CANCER HISTORY: From the original intake note:  The patient had screening mammography 02/01/2015 which showed a calcifications in the upper outer quadrant of the left breast. On 02/08/2015 she underwent left diagnostic mammography with tomosynthesis and left breast ultrasonography at the breast Center. The breast density was category C. Mammography confirmed a group of pleomorphic calcifications spanning 4.2 cm maximally. Physical examination of the upper outer quadrant of the left breast showed an area of thickening at the approximate 2:00 position. Ultrasound of this area showed no discrete masses. There was vague shadowing present. Calcifications could not be clearly identified sonographically. There was no lymphadenopathy in the left axilla.  On the same day the patient underwent biopsy of the left breast area in question, with the pathology (S [id number]) showing ductal carcinoma in situ, grade 2 or 3, with possible areas of microinvasion, the cancer cells being strongly estrogen and progesterone receptor positive, both at 100%.  On 02/15/2015 the patient underwent bilateral breast MRI. This showed the area of malignancy in the upper outer quadrant to  measure 4.5 cm, including a large area of non-masslike enhancement and a 1 cm spiculated mass. There was also an indeterminate oval mass in the central left breast and another indeterminant mass in the slightly outer right breast anteriorly. Biopsy of the right breast mass 02/18/2015 ([id number]) showed a fibroadenoma, with no evidence of malignancy. Biopsy of 2 additional areas of the left breast (at 10:00 and 2:00) showed a fibroadenoma in the upper inner quadrant, and pseudo-angiomatous stromal hyperplasia (PA SH) at the 2:00 area of the left breast.  The patient's subsequent history is as detailed below.  INTERVAL HISTORY:  Cheryl [name] returns today for follow-up of her triple negative invasive breast cancer.  [name] currently receiving radiation which she is tolerating well. She tells me her skin is holding up, she is not particularly fatigued. [name] working full-time. Family is doing well. "everything is okay".  REVIEW OF SYSTEMS: Cheryl [name] Still has some of the fingertips and toe tip neuropathy that led [place] to stop the Taxol. It never quite goes away although it gets better and then maybe low worse. These variations are unpredictable. It is never quite completely gone. Aside from these issues a detailed review of systems today was entirely benign  PAST MEDICAL HISTORY: Past Medical History  Diagnosis Date  . Wears contact lenses   . Breast cancer ([place])     left breast    PAST SURGICAL HISTORY: Past Surgical History  Procedure Laterality Date  . Breast lumpectomy with needle localization and axillary sentinel lymph node bx Left 03/27/2015    Procedure: LEFT BREAST LUMPECTOMY WITH BRACKETED NEEDLE LOCALIZATION AND LEFT  AXILLARY SENTINEL LYMPH NODE BX;  Surgeon: Cheryl Seltzer, MD;  Location: [facility];  Service: General;  Laterality: Left;  . Breast surgery    .  Re-excision of breast lumpectomy Left 04/05/2015    Procedure: RE-EXCISION LEFT  BREAST LUMPECTOMY;  Surgeon:  Cheryl Seltzer, MD;  Location: [facility];  Service: General;  Laterality: Left;  . Portacath placement Right 04/25/2015    Procedure: INSERTION PORT-A-CATH;  Surgeon: Cheryl Seltzer, MD;  Location: [facility];  Service: General;  Laterality: Right;    FAMILY HISTORY Family History  Problem Relation Age of Onset  . Lung cancer Maternal Grandmother 35    non smoker  . Lung cancer Maternal Grandfather 59    non smoker worked in [employer]  . Cancer Paternal Grandfather 35    throat cancer ? smoker  . Cancer Cousin 41    throat cancer ? smoker   the patient's parents are living, in their mid-50s as of March 2016. The patient had no siblings. Both the patient's mother's parents died from lung cancer although they did not smoke. [place] and her maternal grandfather was a Ecologist. There is no history of breast or ovarian cancer in the family to her knowledge  GYNECOLOGIC HISTORY:  No LMP recorded. Patient is not currently having periods (Reason: Chemotherapy). Menarche age 55, first live birth age 29. The patient is GX P3. [name] having regular periods. She uses the Essure device for contraception.  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 Her husband Cheryl [name] is a Freight forwarder. Their daughter Cheryl [name] lives in [place] where she works in Press photographer. Daughter Cheryl [name] is a Research scientist (physical sciences) and lives with the patient.. Daughter Cheryl [name] also at home is 25 years old.    ADVANCED DIRECTIVES: Not in place   HEALTH MAINTENANCE: Social History  Substance Use Topics  . Smoking status: Never Smoker   . Smokeless tobacco: Never Used  . Alcohol Use: 0.0 oz/week    0 Standard drinks or equivalent per week     Colonoscopy:  PAP:  Bone density:  Lipid panel:  Allergies  Allergen Reactions  . Adhesive [Tape] Other (See Comments)    Skin blisters    Current Outpatient Prescriptions  Medication Sig Dispense  Refill  . albuterol (PROVENTIL HFA;VENTOLIN HFA) 108 (90 BASE) MCG/ACT inhaler Inhale 1-2 puffs into the lungs every 6 (six) hours as needed for wheezing or shortness of breath. 1 Inhaler 1  . ibuprofen (ADVIL,MOTRIN) 400 MG tablet Take 400 mg by mouth every 6 (six) hours as needed.    . lidocaine-prilocaine (EMLA) cream Apply to affected area once 30 g 3   No current facility-administered medications for this visit.    OBJECTIVE:  Young-appearing white woman we'll appears stated age 77 Vitals:   09/06/15 1530  BP: 115/74  Pulse: 88  Temp: 98.5 F (36.9 C)  Resp: 19     Body mass index is 40.01 kg/(m^2).    ECOG FS:0 - Asymptomatic  Sclerae unicteric, pupils round and equal;  Hair is growing back vigorously , same color as pretreatment Oropharynx clear and moist-- no thrush or other lesions No cervical or supraclavicular adenopathy Lungs no rales or rhonchi Heart regular rate and rhythm Abd soft,  Obese,nontender, positive bowel sounds MSK no focal spinal tenderness, no upper extremity lymphedema Neuro: nonfocal, well oriented, appropriate affect Breasts:  The right breast is unremarkable. The left breast is status post lumpectomy and currently receiving radiation. There is minimal erythema. There is no palpable mass. The left axilla is benign.    LAB RESULTS:  CMP  Component Value Date/Time   NA 142 07/25/2015 1444   K 3.6 07/25/2015 1444   CO2 24 07/25/2015 1444   GLUCOSE 130 07/25/2015 1444   BUN 9.2 07/25/2015 1444   CREATININE 0.8 07/25/2015 1444   CALCIUM 9.6 07/25/2015 1444   PROT 6.8 07/25/2015 1444   ALBUMIN 3.7 07/25/2015 1444   AST 28 07/25/2015 1444   ALT 32 07/25/2015 1444   ALKPHOS 63 07/25/2015 1444   BILITOT 0.76 07/25/2015 1444    INo results found for: SPEP, UPEP  Lab Results  Component Value Date   WBC 7.2 09/06/2015   NEUTROABS 5.2 09/06/2015   HGB 10.4* 09/06/2015   HCT 31.1* 09/06/2015   MCV 92.6 09/06/2015   PLT 368 09/06/2015       Chemistry      Component Value Date/Time   NA 142 07/25/2015 1444   K 3.6 07/25/2015 1444   CO2 24 07/25/2015 1444   BUN 9.2 07/25/2015 1444   CREATININE 0.8 07/25/2015 1444      Component Value Date/Time   CALCIUM 9.6 07/25/2015 1444   ALKPHOS 63 07/25/2015 1444   AST 28 07/25/2015 1444   ALT 32 07/25/2015 1444   BILITOT 0.76 07/25/2015 1444       No results found for: LABCA2  No components found for: LABCA125  No results for input(s): INR in the last 168 hours.  Urinalysis    Component Value Date/Time   LABSPEC 1.020 05/30/2015 1502   PHURINE 6.0 05/30/2015 1502   GLUCOSEU Negative 05/30/2015 1502   HGBUR Small 05/30/2015 1502   BILIRUBINUR Negative 05/30/2015 1502   KETONESUR Negative 05/30/2015 1502   PROTEINUR Negative 05/30/2015 1502   UROBILINOGEN 0.2 05/30/2015 1502   NITRITE Negative 05/30/2015 1502   LEUKOCYTESUR Negative 05/30/2015 1502    STUDIES: No results found.  ASSESSMENT: 39 y.o. [place], [place] woman status post left breast biopsy 02/08/2015 for ductal carcinoma in situ, grade 2 or 3, strongly estrogen and progesterone receptor positive, with likely areas of microinvasion  (a) biopsy of an area in the left breast upper outer quadrant showed PASH  (b) biopsy of 2 additional questionable areas, one in each breast, showed bilateral fibroadenomas  (1) genetics testing March 2016 through the BreastNext gene panel offered by [employer] showed no mutations in the following 17 genes: ATM, BARD1, BRCA1, BRCA2, BRIP1, CDH1, CHEK2, MRE11A, MUTYH, NBN, NF1, PALB2, PTEN, RAD50, RAD51C, RAD51D, and TP53.  (2) status post left lumpectomy with sentinel lymph node sampling 03/27/2015 for a pT1c pN0, stage IA invasive ductal carcinoma, grade 3, triple negative, with an MIB-1 of 33%  (a) close margins were cleared with subsequent excision 04/05/2015.  (3) Oncotype DX score of 38 predicts a risk of 26% outside the breast recurrence within 10 years if the  patient's only systemic treatment is tamoxifen for 5 years  (4) adjuvant chemotherapy started 05/02/2015 consisting of doxorubicin and cyclophosphamide in dose dense fashion 4, completed 06/13/2015, followed by paclitaxel weekly 12.   (a) Paclitaxel stopped after only 2 cycles because of persistent neuropathy.  (5) adjuvant radiation to  Be completed 10/02/2015  (5) tamoxifen started 02/20/2015 (neoadjuvantly), discontinued 04/19/2015 so as not to overlap chemotherapy; to be resumed at the completion of radiation  PLAN:  Manika will be done with her radiation mid-November. While she is tolerating it well now, my suspicion is that she will have more fatigue and other side effects around that time. Accordingly it may be worth it to wait until January 1  to resume her tamoxifen.  She understands of course that her tamoxifen will did not affect the risk of her triple negative breast cancer recurring. The point of tamoxifen is prophylactic: She has approximately 1% per year chance of developing another breast cancer in either breast, and if she takes tamoxifen for 5 years she would cut that risk in half. Given her age, this is a significant motivator   she tolerated tamoxifen well previously. She had mild hot flashes which were already improving by the time she discontinued it to start her chemotherapy.  We did review the possible toxicities, side effects and complications today, but she would like to give tamoxifen ago try and if she tolerates it well with continued for 5 (not 10) years    she will see [place] again in March 2017. If she is doing well we will start seeing her on an every six-month basis alternating with her surgeon. Until then she knows to call for any other problems that may develop Cheryl Cruel, MD   09/06/2015 3:34 PM

## 2015-09-06 NOTE — Telephone Encounter (Signed)
Appointments made and avs printed for patient °

## 2015-09-09 ENCOUNTER — Ambulatory Visit
Admission: RE | Admit: 2015-09-09 | Discharge: 2015-09-09 | Disposition: A | Discharge status: Home / Self Care | Payer: Federal, State, Local not specified - PPO | Source: Ambulatory Visit | Attending: Radiation Oncology | Admitting: Radiation Oncology

## 2015-09-09 DIAGNOSIS — Z510 Encounter for antineoplastic radiation therapy: Secondary | ICD-10-CM | POA: Diagnosis not present

## 2015-09-10 ENCOUNTER — Ambulatory Visit
Admission: RE | Admit: 2015-09-10 | Discharge: 2015-09-10 | Disposition: A | Discharge status: Home / Self Care | Payer: Federal, State, Local not specified - PPO | Source: Ambulatory Visit | Attending: Radiation Oncology | Admitting: Radiation Oncology

## 2015-09-10 VITALS — BP 115/76 | HR 94 | Temp 99.0°F | Wt 219.4 lb

## 2015-09-10 DIAGNOSIS — Z510 Encounter for antineoplastic radiation therapy: Secondary | ICD-10-CM | POA: Diagnosis not present

## 2015-09-10 DIAGNOSIS — C50412 Malignant neoplasm of upper-outer quadrant of left female breast: Secondary | ICD-10-CM

## 2015-09-10 NOTE — Progress Notes (Signed)
Weekly Management Note Current Dose: 30.6 Gy  Projected Dose: 61 Gy   Narrative:  The patient presents for routine under treatment assessment.  CBCT/MVCT images/Port film x-rays were reviewed.  The chart was checked. Doing well. No complaints.   Physical Findings: Weight: 219 lb 6.4 oz (99.519 kg). Unchanged. Minimal brown skin over left breast.   Impression:  The patient is tolerating radiation.  Plan:  Continue treatment as planned. Continue radiaplex.

## 2015-09-10 NOTE — Progress Notes (Signed)
Weekly assessment of radiation to left breast.Completed 17 of 33 treatments.MIld skin changes.Tenderness.Has noticed increase in fatigue. BP 115/76 mmHg  Pulse 94  Temp(Src) 99 F (37.2 C)  Wt 219 lb 6.4 oz (99.519 kg)

## 2015-09-11 ENCOUNTER — Ambulatory Visit
Admission: RE | Admit: 2015-09-11 | Discharge: 2015-09-11 | Disposition: A | Discharge status: Home / Self Care | Payer: Federal, State, Local not specified - PPO | Source: Ambulatory Visit | Attending: Radiation Oncology | Admitting: Radiation Oncology

## 2015-09-11 DIAGNOSIS — Z510 Encounter for antineoplastic radiation therapy: Secondary | ICD-10-CM | POA: Diagnosis not present

## 2015-09-12 ENCOUNTER — Ambulatory Visit
Admission: RE | Admit: 2015-09-12 | Discharge: 2015-09-12 | Disposition: A | Discharge status: Home / Self Care | Payer: Federal, State, Local not specified - PPO | Source: Ambulatory Visit | Attending: Radiation Oncology | Admitting: Radiation Oncology

## 2015-09-12 DIAGNOSIS — Z510 Encounter for antineoplastic radiation therapy: Secondary | ICD-10-CM | POA: Diagnosis not present

## 2015-09-13 ENCOUNTER — Ambulatory Visit
Admission: RE | Admit: 2015-09-13 | Discharge: 2015-09-13 | Disposition: A | Discharge status: Home / Self Care | Payer: Federal, State, Local not specified - PPO | Source: Ambulatory Visit | Attending: Radiation Oncology | Admitting: Radiation Oncology

## 2015-09-13 DIAGNOSIS — Z510 Encounter for antineoplastic radiation therapy: Secondary | ICD-10-CM | POA: Diagnosis not present

## 2015-09-16 ENCOUNTER — Ambulatory Visit
Admission: RE | Admit: 2015-09-16 | Discharge: 2015-09-16 | Disposition: A | Discharge status: Home / Self Care | Payer: Federal, State, Local not specified - PPO | Source: Ambulatory Visit | Attending: Radiation Oncology | Admitting: Radiation Oncology

## 2015-09-16 DIAGNOSIS — Z510 Encounter for antineoplastic radiation therapy: Secondary | ICD-10-CM | POA: Diagnosis not present

## 2015-09-17 ENCOUNTER — Encounter: Payer: Self-pay | Admitting: Radiation Oncology

## 2015-09-17 ENCOUNTER — Ambulatory Visit
Admission: RE | Admit: 2015-09-17 | Discharge: 2015-09-17 | Disposition: A | Discharge status: Home / Self Care | Payer: Federal, State, Local not specified - PPO | Source: Ambulatory Visit | Attending: Radiation Oncology | Admitting: Radiation Oncology

## 2015-09-17 VITALS — BP 133/75 | HR 84 | Temp 97.8°F | Ht 62.0 in | Wt 220.1 lb

## 2015-09-17 DIAGNOSIS — Z510 Encounter for antineoplastic radiation therapy: Secondary | ICD-10-CM | POA: Diagnosis not present

## 2015-09-17 DIAGNOSIS — C50412 Malignant neoplasm of upper-outer quadrant of left female breast: Secondary | ICD-10-CM

## 2015-09-17 NOTE — Progress Notes (Signed)
Weekly Management Note Current Dose: 39.6  Gy  Projected Dose: 61 Gy   Narrative:  The patient presents for routine under treatment assessment.  CBCT/MVCT images/Port film x-rays were reviewed.  The chart was checked. Doing well. Irritation under arm.   Physical Findings: Weight: 220 lb 1.6 oz (99.837 kg). Unchanged  Impression:  The patient is tolerating radiation.  Plan:  Continue treatment as planned. Continue radiaplex.

## 2015-09-17 NOTE — Progress Notes (Signed)
[name] is here for her 22nd fraction to her Left Breast. She admits to some fatigue, but still working 7 hours a day. Her Left Breast is red anteriorly. Her axilla has hyperpigmentation, and is tender to touch. [name] using the radiaplex as directed.   BP 133/75 mmHg  Pulse 84  Temp(Src) 97.8 F (36.6 C)  Ht 5\' 2"  (1.575 m)  Wt 220 lb 1.6 oz (99.837 kg)  BMI 40.25 kg/m2

## 2015-09-18 ENCOUNTER — Ambulatory Visit
Admission: RE | Admit: 2015-09-18 | Discharge: 2015-09-18 | Disposition: A | Discharge status: Home / Self Care | Payer: Federal, State, Local not specified - PPO | Source: Ambulatory Visit | Attending: Radiation Oncology | Admitting: Radiation Oncology

## 2015-09-18 DIAGNOSIS — Z510 Encounter for antineoplastic radiation therapy: Secondary | ICD-10-CM | POA: Diagnosis not present

## 2015-09-19 ENCOUNTER — Ambulatory Visit
Admission: RE | Admit: 2015-09-19 | Discharge: 2015-09-19 | Disposition: A | Discharge status: Home / Self Care | Payer: Federal, State, Local not specified - PPO | Source: Ambulatory Visit | Attending: Radiation Oncology | Admitting: Radiation Oncology

## 2015-09-19 DIAGNOSIS — Z510 Encounter for antineoplastic radiation therapy: Secondary | ICD-10-CM | POA: Diagnosis not present

## 2015-09-20 ENCOUNTER — Ambulatory Visit
Admission: RE | Admit: 2015-09-20 | Discharge: 2015-09-20 | Disposition: A | Discharge status: Home / Self Care | Payer: Federal, State, Local not specified - PPO | Source: Ambulatory Visit | Attending: Radiation Oncology | Admitting: Radiation Oncology

## 2015-09-20 DIAGNOSIS — Z510 Encounter for antineoplastic radiation therapy: Secondary | ICD-10-CM | POA: Diagnosis not present

## 2015-09-22 ENCOUNTER — Ambulatory Visit: Payer: Federal, State, Local not specified - PPO

## 2015-09-23 ENCOUNTER — Ambulatory Visit
Admission: RE | Admit: 2015-09-23 | Discharge: 2015-09-23 | Disposition: A | Discharge status: Home / Self Care | Payer: Federal, State, Local not specified - PPO | Source: Ambulatory Visit | Attending: Radiation Oncology | Admitting: Radiation Oncology

## 2015-09-23 ENCOUNTER — Ambulatory Visit: Payer: Federal, State, Local not specified - PPO

## 2015-09-23 DIAGNOSIS — Z510 Encounter for antineoplastic radiation therapy: Secondary | ICD-10-CM | POA: Diagnosis not present

## 2015-09-24 ENCOUNTER — Ambulatory Visit
Admission: RE | Admit: 2015-09-24 | Discharge: 2015-09-24 | Disposition: A | Discharge status: Home / Self Care | Payer: Federal, State, Local not specified - PPO | Source: Ambulatory Visit | Attending: Radiation Oncology | Admitting: Radiation Oncology

## 2015-09-24 ENCOUNTER — Encounter: Payer: Self-pay | Admitting: Radiation Oncology

## 2015-09-24 VITALS — BP 123/59 | HR 83 | Temp 98.3°F | Ht 62.0 in | Wt 218.8 lb

## 2015-09-24 DIAGNOSIS — C50412 Malignant neoplasm of upper-outer quadrant of left female breast: Secondary | ICD-10-CM

## 2015-09-24 DIAGNOSIS — Z510 Encounter for antineoplastic radiation therapy: Secondary | ICD-10-CM | POA: Diagnosis not present

## 2015-09-24 NOTE — Progress Notes (Signed)
[name] is here for her 27th fraction of radiation to her Left breast. She reports mild fatigue, but is working full-time. Her left anterior breast to her left axilla is hyperpigmented and tender. The skin around her Left nipple is peeling, but no open areas are observed at this time. [name] using the radiaplex cream as directed.   BP 123/59 mmHg  Pulse 83  Temp(Src) 98.3 F (36.8 C)  Ht 5\' 2"  (1.575 m)  Wt 218 lb 12.8 oz (99.247 kg)  BMI 40.01 kg/m2

## 2015-09-24 NOTE — Progress Notes (Signed)
Weekly Management Note Current Dose: 49  Gy  Projected Dose: 61 Gy   Narrative:  The patient presents for routine under treatment assessment.  CBCT/MVCT images/Port film x-rays were reviewed.  The chart was checked. Doing well. Irritation under arm continues. Nipple soreness.   Physical Findings: Weight: 218 lb 12.8 oz (99.247 kg). Skin darkening. Nipple is dark.   Impression:  The patient is tolerating radiation.  Plan:  Continue treatment as planned. Continue radiaplex.

## 2015-09-25 ENCOUNTER — Ambulatory Visit
Admission: RE | Admit: 2015-09-25 | Discharge: 2015-09-25 | Disposition: A | Discharge status: Home / Self Care | Payer: Federal, State, Local not specified - PPO | Source: Ambulatory Visit | Attending: Radiation Oncology | Admitting: Radiation Oncology

## 2015-09-25 DIAGNOSIS — Z510 Encounter for antineoplastic radiation therapy: Secondary | ICD-10-CM | POA: Diagnosis not present

## 2015-09-26 ENCOUNTER — Ambulatory Visit
Admission: RE | Admit: 2015-09-26 | Discharge: 2015-09-26 | Disposition: A | Discharge status: Home / Self Care | Payer: Federal, State, Local not specified - PPO | Source: Ambulatory Visit | Attending: Radiation Oncology | Admitting: Radiation Oncology

## 2015-09-26 ENCOUNTER — Encounter: Payer: Self-pay | Admitting: Nurse Practitioner

## 2015-09-26 DIAGNOSIS — Z510 Encounter for antineoplastic radiation therapy: Secondary | ICD-10-CM | POA: Diagnosis not present

## 2015-09-27 ENCOUNTER — Ambulatory Visit
Admission: RE | Admit: 2015-09-27 | Discharge: 2015-09-27 | Disposition: A | Discharge status: Home / Self Care | Payer: Federal, State, Local not specified - PPO | Source: Ambulatory Visit | Attending: Radiation Oncology | Admitting: Radiation Oncology

## 2015-09-27 DIAGNOSIS — Z510 Encounter for antineoplastic radiation therapy: Secondary | ICD-10-CM | POA: Diagnosis not present

## 2015-09-30 ENCOUNTER — Ambulatory Visit
Admission: RE | Admit: 2015-09-30 | Discharge: 2015-09-30 | Disposition: A | Discharge status: Home / Self Care | Payer: Federal, State, Local not specified - PPO | Source: Ambulatory Visit | Attending: Radiation Oncology | Admitting: Radiation Oncology

## 2015-09-30 DIAGNOSIS — Z510 Encounter for antineoplastic radiation therapy: Secondary | ICD-10-CM | POA: Diagnosis not present

## 2015-10-01 ENCOUNTER — Ambulatory Visit
Admission: RE | Admit: 2015-10-01 | Discharge: 2015-10-01 | Disposition: A | Discharge status: Home / Self Care | Payer: Federal, State, Local not specified - PPO | Source: Ambulatory Visit | Attending: Radiation Oncology | Admitting: Radiation Oncology

## 2015-10-01 ENCOUNTER — Encounter: Payer: Self-pay | Admitting: Radiation Oncology

## 2015-10-01 VITALS — BP 133/83 | HR 102 | Temp 98.2°F | Ht 62.0 in | Wt 218.8 lb

## 2015-10-01 DIAGNOSIS — Z510 Encounter for antineoplastic radiation therapy: Secondary | ICD-10-CM | POA: Diagnosis not present

## 2015-10-01 DIAGNOSIS — C50412 Malignant neoplasm of upper-outer quadrant of left female breast: Secondary | ICD-10-CM

## 2015-10-01 NOTE — Progress Notes (Signed)
  Radiation Oncology         [phone] ________________________________  Name: Cheryl [name] MRN: [id number]  Date: 10/01/2015  DOB: Sep 24, 1976  End of Treatment Note  Diagnosis:   Breast cancer of upper-outer quadrant of left female breast ([place])   Staging form: Breast, AJCC 7th Edition     Clinical: Stage 0 (Tis (DCIS), N0, M0) - Signed by [name], MD on 02/20/2015     Pathologic stage from 03/29/2015: Stage IA (T1c, N0, cM0) - Signed by Enid Cutter, MD on 04/04/2015       Staging comments: Staged on final lumpectomy specimen by Dr. Avis Epley.  Indication for treatment:  Curative      Radiation treatment dates:   08/19/15-10/02/15  Site/dose:   Left breast/ 45 Gy at 1.8 Gy per fraction x 25 fractions.  Left breast boost/ 16 Gy at 2 Gy per fraction x 8 fractions  Beams/energy:  Opposed tangents with reduced fields and breath hold technique / 10 MV photons Photon wedge pair / 6 and 10 MV photons   Narrative: The patient tolerated radiation treatment relatively well.   She had dry desquamation which was treated with radiaplex.   Plan: The patient has completed radiation treatment. The patient will return to radiation oncology clinic for routine followup in one month. I advised them to call or return sooner if they have any questions or concerns related to their recovery or treatment.  ------------------------------------------------  [name], MD

## 2015-10-01 NOTE — Progress Notes (Signed)
Cheryl [name] is here for her last treatment of radiation to her Left Breast. She denies any pain at this time. She states her energy level is still good, though she is tired at the end of the day, after working.  Her skin is red to her anterior Right Breast. She has hyperpigmentation to her Right axilla area and Right lateral breast. Her nipple has dry skin present. She denies tenderness to these areas. [name] using the radiaplex cream as directed.   BP 133/83 mmHg  Pulse 102  Temp(Src) 98.2 F (36.8 C)  Ht 5\' 2"  (1.575 m)  Wt 218 lb 12.8 oz (99.247 kg)  BMI 40.01 kg/m2

## 2015-10-02 ENCOUNTER — Ambulatory Visit
Admission: RE | Admit: 2015-10-02 | Discharge: 2015-10-02 | Disposition: A | Discharge status: Home / Self Care | Payer: Federal, State, Local not specified - PPO | Source: Ambulatory Visit | Attending: Radiation Oncology | Admitting: Radiation Oncology

## 2015-10-02 DIAGNOSIS — Z510 Encounter for antineoplastic radiation therapy: Secondary | ICD-10-CM | POA: Diagnosis not present

## 2015-10-03 ENCOUNTER — Telehealth: Payer: Self-pay | Admitting: *Deleted

## 2015-10-03 NOTE — Telephone Encounter (Signed)
Left vm for pt to congratulate pt on completion of xrt and for a return call to assess needs after xrt.

## 2015-10-07 ENCOUNTER — Other Ambulatory Visit: Payer: Self-pay | Admitting: Oncology

## 2015-10-07 ENCOUNTER — Other Ambulatory Visit: Payer: Self-pay | Admitting: Adult Health

## 2015-10-07 DIAGNOSIS — C50412 Malignant neoplasm of upper-outer quadrant of left female breast: Secondary | ICD-10-CM

## 2015-10-08 ENCOUNTER — Telehealth: Payer: Self-pay | Admitting: Oncology

## 2015-10-08 NOTE — Telephone Encounter (Signed)
Left message on voicemail for patient re SCP visit 11/28/2015. Schedule mailed.

## 2015-10-10 ENCOUNTER — Encounter: Payer: Self-pay | Admitting: Oncology

## 2015-10-13 NOTE — Progress Notes (Signed)
Radiation Oncology         [phone] ________________________________  Name: Cheryl [name]      MRN: [id number]          Date: 08/08/2015              DOB: [birth year]  Optical Surface Tracking Plan:  Since intensity modulated radiotherapy (IMRT) and 3D conformal radiation treatment methods are predicated on accurate and precise positioning for treatment, intrafraction motion monitoring is medically necessary to ensure accurate and safe treatment delivery.  The ability to quantify intrafraction motion without excessive ionizing radiation dose can only be performed with optical surface tracking. Accordingly, surface imaging offers the opportunity to obtain 3D measurements of patient position throughout IMRT and 3D treatments without excessive radiation exposure.  I am ordering optical surface tracking for this patient's upcoming course of radiotherapy. ________________________________ Signature   Reference:   Ursula Alert, J, et al. Surface imaging-based analysis of intrafraction motion for breast radiotherapy patients.Journal of [place], n. 6, nov. 2014. ISSN [id number].   Available at: <http://www.jacmp.org/index.php/jacmp/article/view/4957>.

## 2015-10-22 ENCOUNTER — Encounter: Payer: Self-pay | Admitting: Oncology

## 2015-11-07 ENCOUNTER — Ambulatory Visit
Admission: RE | Admit: 2015-11-07 | Payer: Federal, State, Local not specified - PPO | Source: Ambulatory Visit | Admitting: Radiation Oncology

## 2015-11-07 ENCOUNTER — Ambulatory Visit
Admission: RE | Admit: 2015-11-07 | Discharge: 2015-11-07 | Disposition: A | Discharge status: Home / Self Care | Payer: Federal, State, Local not specified - PPO | Source: Ambulatory Visit | Attending: Radiation Oncology | Admitting: Radiation Oncology

## 2015-11-07 DIAGNOSIS — C50412 Malignant neoplasm of upper-outer quadrant of left female breast: Secondary | ICD-10-CM

## 2015-11-07 NOTE — Addendum Note (Signed)
Encounter addended by: [name], MD on: 11/07/2015  4:35 PM<BR>     Documentation filed: Notes Section

## 2015-11-14 ENCOUNTER — Telehealth: Payer: Self-pay | Admitting: Nurse Practitioner

## 2015-11-14 NOTE — Telephone Encounter (Signed)
Please contact patient about port-a-cath flush or removal.

## 2015-11-14 NOTE — Telephone Encounter (Signed)
Called and left message for patient regarding need to reschedule upcoming appointment for survivorship care plan review on 11/28/15.  Provided contact information and requested call back.  Will await return call from patient.

## 2015-11-19 ENCOUNTER — Other Ambulatory Visit: Payer: Self-pay | Admitting: Nurse Practitioner

## 2015-11-26 ENCOUNTER — Telehealth: Payer: Self-pay | Admitting: Nurse Practitioner

## 2015-11-26 NOTE — Telephone Encounter (Signed)
Attempted to reach patient regarding her appointment for survivorship care plan visit.  Appointment originally scheduled for Thursday 11/28/15 in afternoon; visit canceled due to change in this provider's schedule.  Left second message for return call to reschedule visit. Will await return call.

## 2015-11-28 ENCOUNTER — Encounter: Payer: Federal, State, Local not specified - PPO | Admitting: Nurse Practitioner

## 2016-01-13 ENCOUNTER — Encounter: Payer: Self-pay | Admitting: Nurse Practitioner

## 2016-01-13 DIAGNOSIS — C50412 Malignant neoplasm of upper-outer quadrant of left female breast: Secondary | ICD-10-CM

## 2016-01-13 NOTE — Progress Notes (Signed)
The Survivorship Care Plan was mailed to [name] as she reported not being able to come in to the Survivorship Clinic for an in-person visit at this time. A letter was mailed to her outlining the purpose of the content of the care plan, as well as encouraging her to reach out to me with any questions or concerns.  My business card was included in the correspondence to the patient as well.   I will not be placing any follow-up appointments to the Survivorship Clinic for [name], but I am happy to see her at any time in the future for any survivorship concerns that may arise. Thank you for allowing me to participate in her care!  Kenn File, Joanna [phone]

## 2016-01-31 ENCOUNTER — Other Ambulatory Visit (HOSPITAL_BASED_OUTPATIENT_CLINIC_OR_DEPARTMENT_OTHER): Payer: Federal, State, Local not specified - PPO

## 2016-01-31 DIAGNOSIS — C50412 Malignant neoplasm of upper-outer quadrant of left female breast: Secondary | ICD-10-CM

## 2016-01-31 DIAGNOSIS — Z86000 Personal history of in-situ neoplasm of breast: Secondary | ICD-10-CM | POA: Diagnosis not present

## 2016-01-31 LAB — CBC WITH DIFFERENTIAL/PLATELET
BASO%: 0.4 % (ref 0.0–2.0)
Basophils Absolute: 0 10*3/uL (ref 0.0–0.1)
EOS%: 1.8 % (ref 0.0–7.0)
Eosinophils Absolute: 0.1 10*3/uL (ref 0.0–0.5)
HEMATOCRIT: 37.6 % (ref 34.8–46.6)
HEMOGLOBIN: 12.6 g/dL (ref 11.6–15.9)
LYMPH#: 1.8 10*3/uL (ref 0.9–3.3)
LYMPH%: 23 % (ref 14.0–49.7)
MCH: 28.9 pg (ref 25.1–34.0)
MCHC: 33.5 g/dL (ref 31.5–36.0)
MCV: 86.2 fL (ref 79.5–101.0)
MONO#: 0.5 10*3/uL (ref 0.1–0.9)
MONO%: 5.8 % (ref 0.0–14.0)
NEUT%: 69 % (ref 38.4–76.8)
NEUTROS ABS: 5.5 10*3/uL (ref 1.5–6.5)
PLATELETS: 276 10*3/uL (ref 145–400)
RBC: 4.36 10*6/uL (ref 3.70–5.45)
RDW: 14.8 % — AB (ref 11.2–14.5)
WBC: 8 10*3/uL (ref 3.9–10.3)

## 2016-01-31 LAB — COMPREHENSIVE METABOLIC PANEL
ALT: 19 U/L (ref 0–55)
ANION GAP: 13 meq/L — AB (ref 3–11)
AST: 19 U/L (ref 5–34)
Albumin: 4 g/dL (ref 3.5–5.0)
Alkaline Phosphatase: 89 U/L (ref 40–150)
BILIRUBIN TOTAL: 0.74 mg/dL (ref 0.20–1.20)
BUN: 13.6 mg/dL (ref 7.0–26.0)
CALCIUM: 9.7 mg/dL (ref 8.4–10.4)
CO2: 23 mEq/L (ref 22–29)
CREATININE: 0.9 mg/dL (ref 0.6–1.1)
Chloride: 104 mEq/L (ref 98–109)
EGFR: 76 mL/min/{1.73_m2} — ABNORMAL LOW (ref >90)
Glucose: 102 mg/dl (ref 70–140)
Potassium: 4.5 mEq/L (ref 3.5–5.1)
Sodium: 140 mEq/L (ref 136–145)
TOTAL PROTEIN: 7.5 g/dL (ref 6.4–8.3)

## 2016-02-07 ENCOUNTER — Ambulatory Visit (HOSPITAL_BASED_OUTPATIENT_CLINIC_OR_DEPARTMENT_OTHER): Payer: Federal, State, Local not specified - PPO | Admitting: Nurse Practitioner

## 2016-02-07 ENCOUNTER — Encounter: Payer: Self-pay | Admitting: Nurse Practitioner

## 2016-02-07 ENCOUNTER — Telehealth: Payer: Self-pay | Admitting: Nurse Practitioner

## 2016-02-07 VITALS — BP 130/83 | HR 96 | Temp 98.1°F | Resp 18 | Ht 62.0 in | Wt 201.6 lb

## 2016-02-07 DIAGNOSIS — C50412 Malignant neoplasm of upper-outer quadrant of left female breast: Secondary | ICD-10-CM

## 2016-02-07 DIAGNOSIS — Z79811 Long term (current) use of aromatase inhibitors: Secondary | ICD-10-CM

## 2016-02-07 DIAGNOSIS — Z853 Personal history of malignant neoplasm of breast: Secondary | ICD-10-CM | POA: Diagnosis not present

## 2016-02-07 DIAGNOSIS — R232 Flushing: Secondary | ICD-10-CM

## 2016-02-07 DIAGNOSIS — T451X5A Adverse effect of antineoplastic and immunosuppressive drugs, initial encounter: Secondary | ICD-10-CM

## 2016-02-07 MED ORDER — VENLAFAXINE HCL ER 37.5 MG PO CP24: 37.5000 mg | ORAL_CAPSULE | Freq: Every day | ORAL | Status: DC

## 2016-02-07 MED ORDER — TAMOXIFEN CITRATE 20 MG PO TABS: 20.0000 mg | ORAL_TABLET | Freq: Every day | ORAL | Status: DC

## 2016-02-07 NOTE — Telephone Encounter (Signed)
appt made and avs printed °

## 2016-02-07 NOTE — Progress Notes (Signed)
[name]  [phone] [phone]     ID: Cheryl [name] DOB: 1976/03/23  MR#: [id number]  LZJ#:[id number]  Patient Care Team: No Pcp Per Patient as PCP - General (General Practice) [name], MD as Consulting Physician (Obstetrics and Gynecology) [name], MD as Consulting Physician (General Surgery) [name], MD as Consulting Physician (Oncology) [name], MD as Consulting Physician (Radiation Oncology) [name], RN as Registered Nurse [name], RN as Registered Nurse [name], NP as Nurse Practitioner (Nurse Practitioner) [name], NP as Nurse Practitioner (Hematology and Oncology) PCP: No PCP Per Patient OTHER MD:  CHIEF COMPLAINT: Left breast cancer  CURRENT TREATMENT: observation, tamoxifen  BREAST CANCER HISTORY: From the original intake note:  The patient had screening mammography 02/01/2015 which showed a calcifications in the upper outer quadrant of the left breast. On 02/08/2015 she underwent left diagnostic mammography with tomosynthesis and left breast ultrasonography at the breast Center. The breast density was category C. Mammography confirmed a group of pleomorphic calcifications spanning 4.2 cm maximally. Physical examination of the upper outer quadrant of the left breast showed an area of thickening at the approximate 2:00 position. Ultrasound of this area showed no discrete masses. There was vague shadowing present. Calcifications could not be clearly identified sonographically. There was no lymphadenopathy in the left axilla.  On the same day the patient underwent biopsy of the left breast area in question, with the pathology (S [id number]) showing ductal carcinoma in situ, grade 2 or 3, with possible areas of microinvasion, the cancer cells being strongly estrogen and progesterone receptor positive, both at 100%.  On 02/15/2015 the patient underwent bilateral  breast MRI. This showed the area of malignancy in the upper outer quadrant to measure 4.5 cm, including a large area of non-masslike enhancement and a 1 cm spiculated mass. There was also an indeterminate oval mass in the central left breast and another indeterminant mass in the slightly outer right breast anteriorly. Biopsy of the right breast mass 02/18/2015 ([id number]) showed a fibroadenoma, with no evidence of malignancy. Biopsy of 2 additional areas of the left breast (at 10:00 and 2:00) showed a fibroadenoma in the upper inner quadrant, and pseudo-angiomatous stromal hyperplasia (PA SH) at the 2:00 area of the left breast.  The patient's subsequent history is as detailed below.  INTERVAL HISTORY:  Cheryl [name] returns today for follow-up of her triple negative invasive breast cancer. She has finally completed her adjuvant radiation treatments. [name] ready to discuss restarting tamoxifen as a preventative measure against a future breast cancer. The interval history is remarkable for restarting her periods. They have been regular so far.  REVIEW OF SYSTEMS: A detailed review of systems is entirely negative today.  PAST MEDICAL HISTORY: Past Medical History  Diagnosis Date  . Wears contact lenses   . Breast cancer ([name])     left breast    PAST SURGICAL HISTORY: Past Surgical History  Procedure Laterality Date  . Breast lumpectomy with needle localization and axillary sentinel lymph node bx Left 03/27/2015    Procedure: LEFT BREAST LUMPECTOMY WITH BRACKETED NEEDLE LOCALIZATION AND LEFT  AXILLARY SENTINEL LYMPH NODE BX;  Surgeon: [name], MD;  Location: [facility];  Service: General;  Laterality: Left;  . Breast surgery    . Re-excision of breast lumpectomy Left 04/05/2015    Procedure: RE-EXCISION LEFT  BREAST LUMPECTOMY;  Surgeon: [name], MD;  Location: [facility];  Service: General;  Laterality: Left;  . Portacath placement Right 04/25/2015      Procedure: INSERTION PORT-A-CATH;  Surgeon: [name], MD;  Location: [facility];  Service: General;  Laterality: Right;    FAMILY HISTORY Family History  Problem Relation Age of Onset  . Lung cancer Maternal Grandmother 49    non smoker  . Lung cancer Maternal Grandfather 54    non smoker worked in [employer]  . Cancer Paternal Grandfather 35    throat cancer ? smoker  . Cancer Cousin 41    throat cancer ? smoker   the patient's parents are living, in their mid-50s as of March 2016. The patient had no siblings. Both the patient's mother's parents died from lung cancer although they did not smoke. [place] and her maternal grandfather was a Ecologist. There is no history of breast or ovarian cancer in the family to her knowledge  GYNECOLOGIC HISTORY:  No LMP recorded. Patient is not currently having periods (Reason: Chemotherapy). Menarche age 40, first live birth age 30. The patient is GX P3. [name] having regular periods. She uses the Essure device for contraception.  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 Her husband [name] is a Freight forwarder. Their daughter Cheryl [name] lives in [place] where she works in Press photographer. Daughter Cheryl [name] is a Research scientist (physical sciences) and lives with the patient.. Daughter Cheryl [name] also at home is 11 years old.    ADVANCED DIRECTIVES: Not in place   HEALTH MAINTENANCE: Social History  Substance Use Topics  . Smoking status: Never Smoker   . Smokeless tobacco: Never Used  . Alcohol Use: 0.0 oz/week    0 Standard drinks or equivalent per week     Colonoscopy:  PAP:  Bone density:  Lipid panel:  Allergies  Allergen Reactions  . Adhesive [Tape] Other (See Comments)    Skin blisters    Current Outpatient Prescriptions  Medication Sig Dispense Refill  . albuterol (PROVENTIL HFA;VENTOLIN HFA) 108 (90 BASE) MCG/ACT inhaler Inhale 1-2 puffs into the lungs every 6 (six) hours as needed  for wheezing or shortness of breath. (Patient not taking: Reported on 02/07/2016) 1 Inhaler 1  . ibuprofen (ADVIL,MOTRIN) 400 MG tablet Take 400 mg by mouth every 6 (six) hours as needed. Reported on 02/07/2016    . tamoxifen (NOLVADEX) 20 MG tablet Take 1 tablet (20 mg total) by mouth daily. 90 tablet 3  . venlafaxine XR (EFFEXOR-XR) 37.5 MG 24 hr capsule Take 1 capsule (37.5 mg total) by mouth daily with breakfast. 90 capsule 3   No current facility-administered medications for this visit.    OBJECTIVE:  Young-appearing white woman we'll appears stated age 54 Vitals:   02/07/16 1510  BP: 130/83  Pulse: 96  Temp: 98.1 F (36.7 C)  Resp: 18     Body mass index is 36.86 kg/(m^2).    ECOG FS:0 - Asymptomatic  Skin: warm, dry  HEENT: sclerae anicteric, conjunctivae pink, oropharynx clear. No thrush or mucositis.  Lymph Nodes: No cervical or supraclavicular lymphadenopathy  Lungs: clear to auscultation bilaterally, no rales, wheezes, or rhonci  Heart: regular rate and rhythm  Abdomen: round, soft, non tender, positive bowel sounds  Musculoskeletal: No focal spinal tenderness, no peripheral edema  Neuro: non focal, well oriented, positive affect  Breasts: left breast status post lumpectomy and radiation. No evidence of recurrent disease. Left axilla benign. Right breast unremarkable.  LAB RESULTS:  CMP  Component Value Date/Time   NA 140 01/31/2016 1608   K 4.5 01/31/2016 1608   CO2 23 01/31/2016 1608   GLUCOSE 102 01/31/2016 1608   BUN 13.6 01/31/2016 1608   CREATININE 0.9 01/31/2016 1608   CALCIUM 9.7 01/31/2016 1608   PROT 7.5 01/31/2016 1608   ALBUMIN 4.0 01/31/2016 1608   AST 19 01/31/2016 1608   ALT 19 01/31/2016 1608   ALKPHOS 89 01/31/2016 1608   BILITOT 0.74 01/31/2016 1608    INo results found for: SPEP, UPEP  Lab Results  Component Value Date   WBC 8.0 01/31/2016   NEUTROABS 5.5 01/31/2016   HGB 12.6 01/31/2016   HCT 37.6 01/31/2016   MCV 86.2  01/31/2016   PLT 276 01/31/2016      Chemistry      Component Value Date/Time   NA 140 01/31/2016 1608   K 4.5 01/31/2016 1608   CO2 23 01/31/2016 1608   BUN 13.6 01/31/2016 1608   CREATININE 0.9 01/31/2016 1608      Component Value Date/Time   CALCIUM 9.7 01/31/2016 1608   ALKPHOS 89 01/31/2016 1608   AST 19 01/31/2016 1608   ALT 19 01/31/2016 1608   BILITOT 0.74 01/31/2016 1608       No results found for: LABCA2  No components found for: LABCA125  No results for input(s): INR in the last 168 hours.  Urinalysis    Component Value Date/Time   LABSPEC 1.020 05/30/2015 1502   PHURINE 6.0 05/30/2015 1502   GLUCOSEU Negative 05/30/2015 1502   HGBUR Small 05/30/2015 1502   BILIRUBINUR Negative 05/30/2015 1502   KETONESUR Negative 05/30/2015 1502   PROTEINUR Negative 05/30/2015 1502   UROBILINOGEN 0.2 05/30/2015 1502   NITRITE Negative 05/30/2015 1502   LEUKOCYTESUR Negative 05/30/2015 1502    STUDIES: No results found.  ASSESSMENT: 40 y.o. [place], [place] woman status post left breast biopsy 02/08/2015 for ductal carcinoma in situ, grade 2 or 3, strongly estrogen and progesterone receptor positive, with likely areas of microinvasion  (a) biopsy of an area in the left breast upper outer quadrant showed PASH  (b) biopsy of 2 additional questionable areas, one in each breast, showed bilateral fibroadenomas  (1) genetics testing March 2016 through the BreastNext gene panel offered by [employer] showed no mutations in the following 17 genes: ATM, BARD1, BRCA1, BRCA2, BRIP1, CDH1, CHEK2, MRE11A, MUTYH, NBN, NF1, PALB2, PTEN, RAD50, RAD51C, RAD51D, and TP53.  (2) status post left lumpectomy with sentinel lymph node sampling 03/27/2015 for a pT1c pN0, stage IA invasive ductal carcinoma, grade 3, triple negative, with an MIB-1 of 33%  (a) close margins were cleared with subsequent excision 04/05/2015.  (3) Oncotype DX score of 38 predicts a risk of 26% outside the breast  recurrence within 10 years if the patient's only systemic treatment is tamoxifen for 5 years  (4) adjuvant chemotherapy started 05/02/2015 consisting of doxorubicin and cyclophosphamide in dose dense fashion 4, completed 06/13/2015, followed by paclitaxel weekly 12.   (a) Paclitaxel stopped after only 2 cycles because of persistent neuropathy.  (5) adjuvant radiation to  Be completed 10/02/2015  (5) tamoxifen started 02/20/2015 (neoadjuvantly), discontinued 04/19/2015 so as not to overlap chemotherapy; resumed 02/07/2016  PLAN: Cheryl [name] looks and feels well today. She will have her yearly mammogram performed sometime this month. The labs were reviewed in detail and were entirely stable. Per Dr. Virgie Dad note in Oct 2016: "She understands of course that her tamoxifen will did not affect the risk of her triple  negative breast cancer recurring. The point of tamoxifen is prophylactic: She has approximately 1% per year chance of developing another breast cancer in either breast, and if she takes tamoxifen for 5 years she would cut that risk in half. Given her age, this is a significant motivator."  This information was reviewed with her today ad she is ready to resume tamoxifen accordingly.  We briefly reviewed the mechanism of action and potential side effects of this drug including hot flashes, vaginal wetness, and the increased risk of blood clots. She tolerated this well for 2 months last year, but she does recall hot flashes that were bothersome. We discussed various pharmacological solutions including venlafaxine and gabapentin. We settled on venlafaxine 37.54m daily. She understands that it will take at least 2 weeks for this medicine to build up in her system. If she wishes to discontinue it for any reason, she knows to wean herself slowly.   Trace will return in 3 months for follow up to assess her tolerance. She understands and agrees with this plan. She knows the goal of treatment in her  case is cure. She has been encouraged to call with any issues that might arise before her next visit here.   HLaurie Panda NP   02/07/2016 3:48 PM

## 2016-02-12 ENCOUNTER — Other Ambulatory Visit: Payer: Self-pay | Admitting: Oncology

## 2016-02-12 DIAGNOSIS — Z853 Personal history of malignant neoplasm of breast: Secondary | ICD-10-CM

## 2016-02-13 ENCOUNTER — Encounter: Payer: Self-pay | Admitting: Oncology

## 2016-02-17 ENCOUNTER — Other Ambulatory Visit: Payer: Self-pay | Admitting: Oncology

## 2016-02-20 ENCOUNTER — Other Ambulatory Visit: Payer: Self-pay | Admitting: Oncology

## 2016-02-20 ENCOUNTER — Ambulatory Visit
Admission: RE | Admit: 2016-02-20 | Discharge: 2016-02-20 | Disposition: A | Discharge status: Home / Self Care | Payer: Federal, State, Local not specified - PPO | Source: Ambulatory Visit | Attending: Oncology | Admitting: Oncology

## 2016-02-20 DIAGNOSIS — Z853 Personal history of malignant neoplasm of breast: Secondary | ICD-10-CM

## 2016-03-05 ENCOUNTER — Encounter: Payer: Self-pay | Admitting: Genetic Counselor

## 2016-03-05 DIAGNOSIS — Z1379 Encounter for other screening for genetic and chromosomal anomalies: Secondary | ICD-10-CM | POA: Insufficient documentation

## 2016-05-21 ENCOUNTER — Telehealth: Payer: Self-pay | Admitting: Oncology

## 2016-05-21 ENCOUNTER — Other Ambulatory Visit (HOSPITAL_BASED_OUTPATIENT_CLINIC_OR_DEPARTMENT_OTHER): Payer: Federal, State, Local not specified - PPO

## 2016-05-21 ENCOUNTER — Ambulatory Visit (HOSPITAL_BASED_OUTPATIENT_CLINIC_OR_DEPARTMENT_OTHER): Payer: Federal, State, Local not specified - PPO | Admitting: Oncology

## 2016-05-21 VITALS — BP 114/70 | HR 77 | Temp 98.5°F | Resp 18 | Ht 62.0 in | Wt 192.1 lb

## 2016-05-21 DIAGNOSIS — Z79810 Long term (current) use of selective estrogen receptor modulators (SERMs): Secondary | ICD-10-CM

## 2016-05-21 DIAGNOSIS — C50412 Malignant neoplasm of upper-outer quadrant of left female breast: Secondary | ICD-10-CM

## 2016-05-21 LAB — CBC WITH DIFFERENTIAL/PLATELET
BASO%: 0.7 % (ref 0.0–2.0)
BASOS ABS: 0.1 10*3/uL (ref 0.0–0.1)
EOS%: 1 % (ref 0.0–7.0)
Eosinophils Absolute: 0.1 10*3/uL (ref 0.0–0.5)
HEMATOCRIT: 38.1 % (ref 34.8–46.6)
HGB: 12.9 g/dL (ref 11.6–15.9)
LYMPH#: 1.9 10*3/uL (ref 0.9–3.3)
LYMPH%: 21.2 % (ref 14.0–49.7)
MCH: 30.2 pg (ref 25.1–34.0)
MCHC: 33.8 g/dL (ref 31.5–36.0)
MCV: 89.2 fL (ref 79.5–101.0)
MONO#: 0.5 10*3/uL (ref 0.1–0.9)
MONO%: 5.5 % (ref 0.0–14.0)
NEUT#: 6.4 10*3/uL (ref 1.5–6.5)
NEUT%: 71.6 % (ref 38.4–76.8)
Platelets: 267 10*3/uL (ref 145–400)
RBC: 4.27 10*6/uL (ref 3.70–5.45)
RDW: 14.8 % — ABNORMAL HIGH (ref 11.2–14.5)
WBC: 8.9 10*3/uL (ref 3.9–10.3)

## 2016-05-21 LAB — COMPREHENSIVE METABOLIC PANEL
ALT: 16 U/L (ref 0–55)
AST: 15 U/L (ref 5–34)
Albumin: 3.8 g/dL (ref 3.5–5.0)
Alkaline Phosphatase: 64 U/L (ref 40–150)
Anion Gap: 9 mEq/L (ref 3–11)
BUN: 17.7 mg/dL (ref 7.0–26.0)
CALCIUM: 9.8 mg/dL (ref 8.4–10.4)
CHLORIDE: 105 meq/L (ref 98–109)
CO2: 27 meq/L (ref 22–29)
CREATININE: 0.9 mg/dL (ref 0.6–1.1)
EGFR: 78 mL/min/{1.73_m2} — ABNORMAL LOW (ref >90)
Glucose: 106 mg/dl (ref 70–140)
POTASSIUM: 4.3 meq/L (ref 3.5–5.1)
Sodium: 142 mEq/L (ref 136–145)
Total Bilirubin: 0.44 mg/dL (ref 0.20–1.20)
Total Protein: 7.2 g/dL (ref 6.4–8.3)

## 2016-05-21 NOTE — Progress Notes (Signed)
[place]  [phone] [phone]     ID: Cheryl [name] DOB: 1975-12-19  MR#: [id number]  WHQ#:[id number]  Patient Care Team: No Pcp Per Patient as PCP - General (General Practice) Cheryl Compton, MD as Consulting Physician (Obstetrics and Gynecology) [name], MD as Consulting Physician (General Surgery) [name], MD as Consulting Physician (Oncology) [name], MD as Consulting Physician (Radiation Oncology) [name], RN as Registered Nurse [name], RN as Registered Nurse [name], NP as Nurse Practitioner (Nurse Practitioner) [name], NP as Nurse Practitioner (Hematology and Oncology) PCP: No PCP Per Patient OTHER MD:  CHIEF COMPLAINT: Left breast cancer  CURRENT TREATMENT: tamoxifen  BREAST CANCER HISTORY: From the original intake note:  The patient had screening mammography 02/01/2015 which showed a calcifications in the upper outer quadrant of the left breast. On 02/08/2015 she underwent left diagnostic mammography with tomosynthesis and left breast ultrasonography at the breast Center. The breast density was category C. Mammography confirmed a group of pleomorphic calcifications spanning 4.2 cm maximally. Physical examination of the upper outer quadrant of the left breast showed an area of thickening at the approximate 2:00 position. Ultrasound of this area showed no discrete masses. There was vague shadowing present. Calcifications could not be clearly identified sonographically. There was no lymphadenopathy in the left axilla.  On the same day the patient underwent biopsy of the left breast area in question, with the pathology (S [id number]) showing ductal carcinoma in situ, grade 2 or 3, with possible areas of microinvasion, the cancer cells being strongly estrogen and progesterone receptor positive, both at 100%.  On 02/15/2015 the patient underwent bilateral breast MRI.  This showed the area of malignancy in the upper outer quadrant to measure 4.5 cm, including a large area of non-masslike enhancement and a 1 cm spiculated mass. There was also an indeterminate oval mass in the central left breast and another indeterminant mass in the slightly outer right breast anteriorly. Biopsy of the right breast mass 02/18/2015 ([id number]) showed a fibroadenoma, with no evidence of malignancy. Biopsy of 2 additional areas of the left breast (at 10:00 and 2:00) showed a fibroadenoma in the upper inner quadrant, and pseudo-angiomatous stromal hyperplasia (PA SH) at the 2:00 area of the left breast.  The patient's subsequent history is as detailed below.  INTERVAL HISTORY:  Cheryl [name] returns today for follow-up of [name] [name] tolerating it well except for problems with hot flashes. She was started on venlafaxine for this, low dose, with that drug makes her little bit sleepy, so sometimes she doesn't take it. Otherwise she tolerates the tamoxifen well. She obtains it at a very good price  REVIEW OF SYSTEMS: She exercises chiefly by walking, about 30 minutes about 3 times a week. A detailed review of systems today was otherwise noncontributory.  PAST MEDICAL HISTORY: Past Medical History  Diagnosis Date  . Wears contact lenses   . Breast cancer ([place])     left breast    PAST SURGICAL HISTORY: Past Surgical History  Procedure Laterality Date  . Breast lumpectomy with needle localization and axillary sentinel lymph node bx Left 03/27/2015    Procedure: LEFT BREAST LUMPECTOMY WITH BRACKETED NEEDLE LOCALIZATION AND LEFT  AXILLARY SENTINEL LYMPH NODE BX;  Surgeon: [name], MD;  Location: [facility];  Service: General;  Laterality: Left;  . Breast surgery    . Re-excision of breast lumpectomy Left 04/05/2015     Procedure: RE-EXCISION LEFT  BREAST LUMPECTOMY;  Surgeon: [name], MD;  Location: [facility];  Service: General;  Laterality: Left;  . Portacath placement Right 04/25/2015    Procedure: INSERTION PORT-A-CATH;  Surgeon: [name], MD;  Location: [facility];  Service: General;  Laterality: Right;    FAMILY HISTORY Family History  Problem Relation Age of Onset  . Lung cancer Maternal Grandmother 70    non smoker  . Lung cancer Maternal Grandfather 33    non smoker worked in [employer]  . Cancer Paternal Grandfather 35    throat cancer ? smoker  . Cancer Cousin 41    throat cancer ? smoker   the patient's parents are living, in their mid-50s as of March 2016. The patient had no siblings. Both the patient's mother's parents died from lung cancer although they did not smoke. [place] and her maternal grandfather was a Ecologist. There is no history of breast or ovarian cancer in the family to her knowledge  GYNECOLOGIC HISTORY:  No LMP recorded. Patient is not currently having periods (Reason: Chemotherapy). Menarche age 29, first live birth age 61. The patient is GX P3. [name] having regular periods. She uses the Essure device for contraception.  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 Her husband Cheryl [name] is a Freight forwarder. Their daughter [name] lives in [place] where she works in Press photographer. Daughter Summer is a Research scientist (physical sciences) and lives with the patient.. Daughter [name] also at home is 13 years old.    ADVANCED DIRECTIVES: Not in place   HEALTH MAINTENANCE: Social History  Substance Use Topics  . Smoking status: Never Smoker   . Smokeless tobacco: Never Used  . Alcohol Use: 0.0 oz/week    0 Standard drinks or equivalent per week     Colonoscopy:  PAP:  Bone density:  Lipid panel:  Allergies  Allergen Reactions  . Adhesive [Tape] Other (See Comments)    Skin blisters     Current Outpatient Prescriptions  Medication Sig Dispense Refill  . ibuprofen (ADVIL,MOTRIN) 400 MG tablet Take 400 mg by mouth every 6 (six) hours as needed. Reported on 02/07/2016    . tamoxifen (NOLVADEX) 20 MG tablet Take 1 tablet (20 mg total) by mouth daily. 90 tablet 3  . venlafaxine XR (EFFEXOR-XR) 37.5 MG 24 hr capsule Take 1 capsule (37.5 mg total) by mouth daily with breakfast. 90 capsule 3   No current facility-administered medications for this visit.    OBJECTIVE:  Young-appearing white woman Who appears well Filed Vitals:   05/21/16 1551  BP: 114/70  Pulse: 77  Temp: 98.5 F (36.9 C)  Resp: 18     Body mass index is 35.13 kg/(m^2).    ECOG FS:0 - Asymptomatic  Sclerae unicteric, pupils round and equal Oropharynx clear and moist-- no thrush or other lesions No cervical or supraclavicular adenopathy Lungs no rales or rhonchi Heart regular rate and rhythm Abd soft, nontender, positive bowel sounds MSK no focal spinal tenderness, no upper extremity lymphedema Neuro: nonfocal, well oriented, appropriate affect Breasts: The right breast is unremarkable. The left breast is status post lumpectomy and radiation. There is no evidence of local recurrence. The left axilla is benign.   LAB RESULTS:  CMP     Component Value Date/Time   NA  142 05/21/2016 1501   K 4.3 05/21/2016 1501   CO2 27 05/21/2016 1501   GLUCOSE 106 05/21/2016 1501   BUN 17.7 05/21/2016 1501   CREATININE 0.9 05/21/2016 1501   CALCIUM 9.8 05/21/2016 1501   PROT 7.2 05/21/2016 1501   ALBUMIN 3.8 05/21/2016 1501   AST 15 05/21/2016 1501   ALT 16 05/21/2016 1501   ALKPHOS 64 05/21/2016 1501   BILITOT 0.44 05/21/2016 1501    INo results found for: SPEP, UPEP  Lab Results  Component Value Date   WBC 8.9 05/21/2016   NEUTROABS 6.4 05/21/2016   HGB 12.9 05/21/2016   HCT 38.1 05/21/2016   MCV 89.2 05/21/2016   PLT 267 05/21/2016      Chemistry      Component Value Date/Time   NA 142  05/21/2016 1501   K 4.3 05/21/2016 1501   CO2 27 05/21/2016 1501   BUN 17.7 05/21/2016 1501   CREATININE 0.9 05/21/2016 1501      Component Value Date/Time   CALCIUM 9.8 05/21/2016 1501   ALKPHOS 64 05/21/2016 1501   AST 15 05/21/2016 1501   ALT 16 05/21/2016 1501   BILITOT 0.44 05/21/2016 1501       No results found for: LABCA2  No components found for: LABCA125  No results for input(s): INR in the last 168 hours.  Urinalysis    Component Value Date/Time   LABSPEC 1.020 05/30/2015 1502   PHURINE 6.0 05/30/2015 1502   GLUCOSEU Negative 05/30/2015 1502   HGBUR Small 05/30/2015 1502   BILIRUBINUR Negative 05/30/2015 1502   KETONESUR Negative 05/30/2015 1502   PROTEINUR Negative 05/30/2015 1502   UROBILINOGEN 0.2 05/30/2015 1502   NITRITE Negative 05/30/2015 1502   LEUKOCYTESUR Negative 05/30/2015 1502    STUDIES: CLINICAL DATA: Malignant lumpectomy of the upper outer quadrant of the left breast in April, 2016. Patient presents with a palpable lump and pain at the incision site. Annual evaluation.  EXAM: 2D DIGITAL DIAGNOSTIC BILATERAL MAMMOGRAM WITH CAD AND ADJUNCT TOMO  ULTRASOUND LEFT BREAST  COMPARISON: Mammography 03/27/2015 (left), 02/10/2015 (bilateral) and earlier. Preoperative left breast ultrasound 02/18/2015, 02/08/2015.  ACR Breast Density Category c: The breast tissue is heterogeneously dense, which may obscure small masses.  FINDINGS: Standard 2D and tomosynthesis CC and MLO views of the left breast and a spot magnification tangential view of the lumpectomy site in the left breast were obtained. Post lumpectomy scarring in the upper outer quadrant of the left breast. No new or suspicious findings in the left breast.  No findings suspicious for malignancy in the right breast.  Mammographic images were processed with CAD.  On physical exam, there is a surgical scar in the far posterior upper outer left breast. I do not palpate a  discrete mass in the area of the surgical scar. The patient describes tenderness to palpation.  Targeted left breast ultrasound is performed, showing dense shadowing from the surgical scar in the upper outer quadrant. Immediately adjacent to the scar at the 2 o'clock position approximately 9 cm from the nipple is an oval parallel circumscribed anechoic mass with internal echoes, slight acoustic enhancement and no internal power Doppler flow measuring 4 x 4 x 6 mm. No suspicious solid mass is identified.  IMPRESSION: 1. Benign oil cyst in the upper outer quadrant left breast adjacent to the surgical scar from the prior lumpectomy. 2. No mammographic or sonographic evidence of malignancy, left breast. 3. No mammographic evidence of malignancy, right breast.  RECOMMENDATION: Bilateral diagnostic mammography  in 1 year.  I have discussed the findings and recommendations with the patient. Results were also provided in writing at the conclusion of the visit. If applicable, a reminder letter will be sent to the patient regarding the next appointment.  BI-RADS CATEGORY 2: Benign.   Electronically Signed  By: Evangeline Dakin M.D.  On: 02/20/2016 09:30  ASSESSMENT: 40 y.o. [place], [place] woman status post left breast upper outer quadrant biopsy 02/08/2015 for ductal carcinoma in situ, grade 2 or 3, strongly estrogen and progesterone receptor positive, with likely areas of microinvasion  (a) biopsy of an area in the left breast upper outer quadrant showed PASH  (b) biopsy of 2 additional questionable areas, one in each breast, showed bilateral fibroadenomas  (1) genetics testing March 2016 through the BreastNext gene panel offered by [employer] showed no deleterious mutations in ATM, BARD1, BRCA1, BRCA2, BRIP1, CDH1, CHEK2, MRE11A, MUTYH, NBN, NF1, PALB2, PTEN, RAD50, RAD51C, RAD51D, and TP53.  (2) status post left lumpectomy with sentinel lymph node sampling 03/27/2015 for a  pT1c pN0, stage IA invasive ductal carcinoma, grade 3, triple negative, with an MIB-1 of 33%  (a) close margins were cleared with subsequent excision 04/05/2015.  (3) Oncotype DX score of 38 predicts a risk of 26% outside the breast recurrence within 10 years if the patient's only systemic treatment is tamoxifen for 5 years  (4) adjuvant chemotherapy started 05/02/2015 consisting of doxorubicin and cyclophosphamide in dose dense fashion 4, completed 06/13/2015, followed by paclitaxel weekly 12.   (a) Paclitaxel stopped after only 2 cycles because of persistent neuropathy.  (5) adjuvant radiation completed 11/08 questionable fingers of his of 4 g packet/2016  (5) tamoxifen started 02/20/2015 (neoadjuvantly), discontinued 04/19/2015 so as not to overlap chemotherapy; resumed 02/07/2016  PLAN: Kalyse is now a year out from definitive surgery for her breast cancer, no evidence of disease recurrence. This is very favorable.  She continues on tamoxifen for breast cancer prophylaxis. The point of this in a woman with triple negative disease is to prevent the development of another possibly estrogen receptor positive breast cancer. [name] tolerating tamoxifen generally well. She does have hot flashes, for which we have started her on venlafaxine. She gets a little "sleepy" from the venlafaxine, so I have suggested she take this in the evening.  The plan is to continue tamoxifen for total of 5 years  At this point I am comfortable seeing her on a once a year basis. Since she gets her mammography in March I will see her in April 2018. She knows to call for any problems that may develop before her next visit here.  [name], MD   05/23/2016 9:53 AM

## 2016-05-21 NOTE — Telephone Encounter (Signed)
appt made and avs printed °

## 2016-05-23 MED ORDER — VENLAFAXINE HCL ER 37.5 MG PO CP24: 37.5000 mg | ORAL_CAPSULE | Freq: Every day | ORAL | Status: DC

## 2016-05-23 MED ORDER — TAMOXIFEN CITRATE 20 MG PO TABS: 20.0000 mg | ORAL_TABLET | Freq: Every day | ORAL | Status: DC

## 2016-10-14 ENCOUNTER — Encounter: Payer: Self-pay | Admitting: Oncology

## 2016-10-19 ENCOUNTER — Encounter: Payer: Self-pay | Admitting: Oncology

## 2016-10-30 NOTE — Telephone Encounter (Signed)
I contacted patient and she reports she has not been contacted to have her breast checked.  Appointment made with [facility] NP for Mon 12/11 at 430 pm.  Routed to [employer]

## 2016-10-31 ENCOUNTER — Other Ambulatory Visit: Payer: Self-pay | Admitting: Oncology

## 2016-10-31 DIAGNOSIS — C50412 Malignant neoplasm of upper-outer quadrant of left female breast: Secondary | ICD-10-CM

## 2016-11-02 ENCOUNTER — Telehealth: Payer: Self-pay | Admitting: Nurse Practitioner

## 2016-11-02 ENCOUNTER — Telehealth: Payer: Self-pay | Admitting: Oncology

## 2016-11-02 ENCOUNTER — Ambulatory Visit: Payer: Federal, State, Local not specified - PPO | Admitting: Nurse Practitioner

## 2016-11-02 NOTE — Telephone Encounter (Signed)
Called and spoke to patient this morning.  Confirmed the patient has a newly found questionable mass near her previous surgical site of the left breast approximately 3 weeks ago.  She states the site is slightly tender; but there is no erythema, no red streaks, no warmth, and patient has had no fever or chills.  Advised patient that Dr. Jana Hakim order both a diagnostic ultrasound and mammogram to be obtained as soon as possible for further evaluation.  This provider then called [facility]; and confirm the patient will have an appointment for imaging of her breast on Monday 11/09/2016.  Advice patient would cancel the symptom management clinic appointment that was scheduled for later this afternoon; and proceeded with the planned breast imaging for further evaluation.  Also, advised patient would schedule a follow-up appointment with Dr. Jana Hakim that same week.  The patient has the breast imaging as well.  Patient was advised to call/return or go directly to the emergency department for any worsening symptoms whatsoever.  Patient stated understanding of all instructions and was in agreement with this plan of care.

## 2016-11-02 NOTE — Telephone Encounter (Signed)
lvm to inform pt of [facility] appt 12/21 at 9 am per LOS

## 2016-11-06 ENCOUNTER — Other Ambulatory Visit: Payer: Self-pay | Admitting: *Deleted

## 2016-11-10 ENCOUNTER — Ambulatory Visit
Admission: RE | Admit: 2016-11-10 | Discharge: 2016-11-10 | Disposition: A | Discharge status: Home / Self Care | Payer: Federal, State, Local not specified - PPO | Source: Ambulatory Visit | Attending: Oncology | Admitting: Oncology

## 2016-11-10 ENCOUNTER — Other Ambulatory Visit: Payer: Self-pay | Admitting: *Deleted

## 2016-11-10 DIAGNOSIS — C50412 Malignant neoplasm of upper-outer quadrant of left female breast: Secondary | ICD-10-CM

## 2016-11-12 ENCOUNTER — Ambulatory Visit: Payer: Federal, State, Local not specified - PPO | Admitting: Oncology

## 2017-02-24 ENCOUNTER — Other Ambulatory Visit: Payer: Self-pay | Admitting: Oncology

## 2017-02-24 DIAGNOSIS — Z853 Personal history of malignant neoplasm of breast: Secondary | ICD-10-CM

## 2017-02-24 DIAGNOSIS — N6320 Unspecified lump in the left breast, unspecified quadrant: Secondary | ICD-10-CM

## 2017-03-03 ENCOUNTER — Ambulatory Visit
Admission: RE | Admit: 2017-03-03 | Discharge: 2017-03-03 | Disposition: A | Discharge status: Home / Self Care | Payer: Federal, State, Local not specified - PPO | Source: Ambulatory Visit | Attending: Oncology | Admitting: Oncology

## 2017-03-03 ENCOUNTER — Other Ambulatory Visit: Payer: Self-pay | Admitting: Oncology

## 2017-03-03 DIAGNOSIS — Z853 Personal history of malignant neoplasm of breast: Secondary | ICD-10-CM

## 2017-03-03 DIAGNOSIS — N6320 Unspecified lump in the left breast, unspecified quadrant: Secondary | ICD-10-CM

## 2017-03-05 ENCOUNTER — Ambulatory Visit: Payer: Federal, State, Local not specified - PPO

## 2017-03-05 ENCOUNTER — Other Ambulatory Visit: Payer: Self-pay | Admitting: Oncology

## 2017-03-05 ENCOUNTER — Ambulatory Visit
Admission: RE | Admit: 2017-03-05 | Discharge: 2017-03-05 | Disposition: A | Discharge status: Home / Self Care | Payer: Federal, State, Local not specified - PPO | Source: Ambulatory Visit | Attending: Oncology | Admitting: Oncology

## 2017-03-05 DIAGNOSIS — N6320 Unspecified lump in the left breast, unspecified quadrant: Secondary | ICD-10-CM

## 2017-03-05 DIAGNOSIS — Z853 Personal history of malignant neoplasm of breast: Secondary | ICD-10-CM

## 2017-03-11 ENCOUNTER — Other Ambulatory Visit: Payer: Federal, State, Local not specified - PPO

## 2017-03-17 ENCOUNTER — Ambulatory Visit: Payer: Self-pay | Admitting: General Surgery

## 2017-03-17 ENCOUNTER — Other Ambulatory Visit: Payer: Self-pay | Admitting: Adult Health

## 2017-03-17 ENCOUNTER — Other Ambulatory Visit: Payer: Self-pay | Admitting: General Surgery

## 2017-03-17 DIAGNOSIS — C50912 Malignant neoplasm of unspecified site of left female breast: Secondary | ICD-10-CM

## 2017-03-17 DIAGNOSIS — Z170 Estrogen receptor positive status [ER+]: Principal | ICD-10-CM

## 2017-03-17 DIAGNOSIS — Z171 Estrogen receptor negative status [ER-]: Principal | ICD-10-CM

## 2017-03-17 DIAGNOSIS — C50412 Malignant neoplasm of upper-outer quadrant of left female breast: Secondary | ICD-10-CM

## 2017-03-17 DIAGNOSIS — C50312 Malignant neoplasm of lower-inner quadrant of left female breast: Secondary | ICD-10-CM

## 2017-03-18 ENCOUNTER — Other Ambulatory Visit: Payer: Self-pay | Admitting: General Surgery

## 2017-03-18 ENCOUNTER — Ambulatory Visit
Admission: RE | Admit: 2017-03-18 | Discharge: 2017-03-18 | Disposition: A | Discharge status: Home / Self Care | Payer: Federal, State, Local not specified - PPO | Source: Ambulatory Visit | Attending: General Surgery | Admitting: General Surgery

## 2017-03-18 ENCOUNTER — Other Ambulatory Visit (HOSPITAL_BASED_OUTPATIENT_CLINIC_OR_DEPARTMENT_OTHER): Payer: Federal, State, Local not specified - PPO

## 2017-03-18 ENCOUNTER — Ambulatory Visit (HOSPITAL_BASED_OUTPATIENT_CLINIC_OR_DEPARTMENT_OTHER): Payer: Federal, State, Local not specified - PPO | Admitting: Oncology

## 2017-03-18 VITALS — BP 137/92 | HR 93 | Temp 98.4°F | Resp 18 | Ht 62.0 in | Wt 214.2 lb

## 2017-03-18 DIAGNOSIS — Z171 Estrogen receptor negative status [ER-]: Secondary | ICD-10-CM

## 2017-03-18 DIAGNOSIS — C50412 Malignant neoplasm of upper-outer quadrant of left female breast: Secondary | ICD-10-CM

## 2017-03-18 DIAGNOSIS — C50312 Malignant neoplasm of lower-inner quadrant of left female breast: Secondary | ICD-10-CM

## 2017-03-18 DIAGNOSIS — C50912 Malignant neoplasm of unspecified site of left female breast: Secondary | ICD-10-CM | POA: Insufficient documentation

## 2017-03-18 DIAGNOSIS — Z170 Estrogen receptor positive status [ER+]: Principal | ICD-10-CM

## 2017-03-18 HISTORY — DX: Malignant neoplasm of unspecified site of left female breast: C50.912

## 2017-03-18 LAB — COMPREHENSIVE METABOLIC PANEL
ALBUMIN: 3.8 g/dL (ref 3.5–5.0)
ALK PHOS: 88 U/L (ref 40–150)
ALT: 22 U/L (ref 0–55)
AST: 20 U/L (ref 5–34)
Anion Gap: 9 mEq/L (ref 3–11)
BILIRUBIN TOTAL: 0.52 mg/dL (ref 0.20–1.20)
BUN: 12.8 mg/dL (ref 7.0–26.0)
CALCIUM: 9.5 mg/dL (ref 8.4–10.4)
CO2: 28 mEq/L (ref 22–29)
CREATININE: 0.8 mg/dL (ref 0.6–1.1)
Chloride: 105 mEq/L (ref 98–109)
EGFR: 88 mL/min/{1.73_m2} — ABNORMAL LOW (ref >90)
Glucose: 121 mg/dl (ref 70–140)
POTASSIUM: 4.4 meq/L (ref 3.5–5.1)
Sodium: 142 mEq/L (ref 136–145)
TOTAL PROTEIN: 7.3 g/dL (ref 6.4–8.3)

## 2017-03-18 LAB — CBC WITH DIFFERENTIAL/PLATELET
BASO%: 0.9 % (ref 0.0–2.0)
BASOS ABS: 0.1 10*3/uL (ref 0.0–0.1)
EOS%: 2 % (ref 0.0–7.0)
Eosinophils Absolute: 0.2 10*3/uL (ref 0.0–0.5)
HEMATOCRIT: 37.5 % (ref 34.8–46.6)
HEMOGLOBIN: 12.6 g/dL (ref 11.6–15.9)
LYMPH#: 2.3 10*3/uL (ref 0.9–3.3)
LYMPH%: 26 % (ref 14.0–49.7)
MCH: 30.5 pg (ref 25.1–34.0)
MCHC: 33.7 g/dL (ref 31.5–36.0)
MCV: 90.8 fL (ref 79.5–101.0)
MONO#: 0.5 10*3/uL (ref 0.1–0.9)
MONO%: 6.1 % (ref 0.0–14.0)
NEUT%: 65 % (ref 38.4–76.8)
NEUTROS ABS: 5.7 10*3/uL (ref 1.5–6.5)
Platelets: 351 10*3/uL (ref 145–400)
RBC: 4.14 10*6/uL (ref 3.70–5.45)
RDW: 13.4 % (ref 11.2–14.5)
WBC: 8.8 10*3/uL (ref 3.9–10.3)

## 2017-03-18 NOTE — Progress Notes (Signed)
[place]  [phone] [phone]) [phone]     ID: Cheryl [name] DOB: 1976/07/13  MR#: [id number]  NOM#:[id number]  Patient Care Team: No Pcp Per Patient as PCP - General (General Practice) [name], MD as Consulting Physician (Obstetrics and Gynecology) [name], MD as Consulting Physician (General Surgery) [name], MD as Consulting Physician (Oncology) [name], RN as Registered Nurse [name], RN as Registered Nurse Wallace Going, DO as Attending Physician (Plastic Surgery) PCP: No PCP Per Patient OTHER MD:  CHIEF COMPLAINT: Recurrent breast cancer  CURRENT TREATMENT: Awaiting definitive surgery   INTERVAL HISTORY:  Shataya that he'll returns today for follow-up of her triple negative breast cancer, which is now recurrent. She had routine bilateral diagnostic mammography at the [facility] 03/10/2017 and this showed the breast density to be category C. The old lumpectomy site, which was in the upper outer quadrant, appears stable. However there was a new area of asymmetry in the lower inner quadrant of the left breast. Ultrasound confirmed this to be a 1.0 cm mass at the 7:00 radiant 5 cm from the nipple. Aspiration was attempted but no fluid was aspirated and biopsy of this mass was obtained 03/05/2017. This showed ([id number]) invasive mammary carcinoma, grade 2, estrogen and progesterone receptor negative, HER-2 not amplified with a signals ratio 1.17 and a number per cell of 2.45, with an MIB-1 of 15%.  [name] here today to discuss subsequent management  REVIEW OF SYSTEMS: [name] having some hot flashes, otherwise she tolerates tamoxifen well. She tries to exercise by walking. Aside from these issues a detailed review of systems today was noncontributory   BREAST CANCER HISTORY: From the original intake note:  The patient had screening mammography 02/01/2015 which showed a calcifications in the  upper outer quadrant of the left breast. On 02/08/2015 she underwent left diagnostic mammography with tomosynthesis and left breast ultrasonography at the breast Center. The breast density was category C. Mammography confirmed a group of pleomorphic calcifications spanning 4.2 cm maximally. Physical examination of the upper outer quadrant of the left breast showed an area of thickening at the approximate 2:00 position. Ultrasound of this area showed no discrete masses. There was vague shadowing present. Calcifications could not be clearly identified sonographically. There was no lymphadenopathy in the left axilla.  On the same day the patient underwent biopsy of the left breast area in question, with the pathology (S [id number]) showing ductal carcinoma in situ, grade 2 or 3, with possible areas of microinvasion, the cancer cells being strongly estrogen and progesterone receptor positive, both at 100%.  On 02/15/2015 the patient underwent bilateral breast MRI. This showed the area of malignancy in the upper outer quadrant to measure 4.5 cm, including a large area of non-masslike enhancement and a 1 cm spiculated mass. There was also an indeterminate oval mass in the central left breast and another indeterminant mass in the slightly outer right breast anteriorly. Biopsy of the right breast mass 02/18/2015 ([id number]) showed a fibroadenoma, with no evidence of malignancy. Biopsy of 2 additional areas of the left breast (at 10:00 and 2:00) showed a fibroadenoma in the upper inner quadrant, and pseudo-angiomatous stromal hyperplasia (PA SH) at the 2:00 area of the left breast.  The patient's subsequent history is as detailed below.   PAST MEDICAL HISTORY: Past Medical History:  Diagnosis Date  . Breast cancer ([place])    left breast  . Wears contact lenses  PAST SURGICAL HISTORY: Past Surgical History:  Procedure Laterality Date  . BREAST BIOPSY Left 02/18/2015  . BREAST BIOPSY Right 02/18/2015    . BREAST EXCISIONAL BIOPSY Left 04/05/2015  . BREAST LUMPECTOMY Left 03/27/2015  . BREAST LUMPECTOMY WITH NEEDLE LOCALIZATION AND AXILLARY SENTINEL LYMPH NODE BX Left 03/27/2015   Procedure: LEFT BREAST LUMPECTOMY WITH BRACKETED NEEDLE LOCALIZATION AND LEFT  AXILLARY SENTINEL LYMPH NODE BX;  Surgeon: [name], MD;  Location: [place];  Service: General;  Laterality: Left;  . BREAST SURGERY    . PORTACATH PLACEMENT Right 04/25/2015   Procedure: INSERTION PORT-A-CATH;  Surgeon: [name], MD;  Location: [place];  Service: General;  Laterality: Right;  . RE-EXCISION OF BREAST LUMPECTOMY Left 04/05/2015   Procedure: RE-EXCISION LEFT  BREAST LUMPECTOMY;  Surgeon: [name], MD;  Location: [place];  Service: General;  Laterality: Left;    FAMILY HISTORY Family History  Problem Relation Age of Onset  . Lung cancer Maternal Grandmother 33    non smoker  . Lung cancer Maternal Grandfather 88    non smoker worked in [employer]  . Cancer Paternal Grandfather 35    throat cancer ? smoker  . Cancer Cousin 41    throat cancer ? smoker   the patient's parents are living, in their mid-50s as of March 2016. The patient had no siblings. Both the patient's mother's parents died from lung cancer although they did not smoke. [place] and her maternal grandfather was a Ecologist. There is no history of breast or ovarian cancer in the family to her knowledge  GYNECOLOGIC HISTORY:  No LMP recorded. Patient is not currently having periods (Reason: Chemotherapy). Menarche age 27, first live birth age 41. The patient is GX P3. [name] having regular periods. She uses the Essure device for contraception.  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 Her husband Cheryl [name] is a Freight forwarder. Their daughter Cheryl [name] lives in [place] where she works in Press photographer. Daughter Cheryl [name] is a Research scientist (physical sciences) and  lives with the patient.. Daughter Cheryl [name] also at home is 70 years old.    ADVANCED DIRECTIVES: Not in place   HEALTH MAINTENANCE: Social History  Substance Use Topics  . Smoking status: Never Smoker  . Smokeless tobacco: Never Used  . Alcohol use 0.0 oz/week     Colonoscopy:  PAP:  Bone density:  Lipid panel:  Allergies  Allergen Reactions  . Adhesive [Tape] Other (See Comments)    Skin blisters    Current Outpatient Prescriptions  Medication Sig Dispense Refill  . ibuprofen (ADVIL,MOTRIN) 400 MG tablet Take 400 mg by mouth every 6 (six) hours as needed. Reported on 02/07/2016    . venlafaxine XR (EFFEXOR-XR) 37.5 MG 24 hr capsule Take 1 capsule (37.5 mg total) by mouth daily with breakfast. 90 capsule 3   No current facility-administered medications for this visit.     OBJECTIVE:  Young-appearing white woman Who appears stated age  26:   03/18/17 1517  BP: (!) 137/92  Pulse: 93  Resp: 18  Temp: 98.4 F (36.9 C)     Body mass index is 39.18 kg/m.    ECOG FS:0 - Asymptomatic  Sclerae unicteric, EOMs intact Oropharynx clear and moist No cervical or supraclavicular adenopathy Lungs no rales or rhonchi Heart regular rate and rhythm Abd soft, nontender, positive bowel sounds MSK no focal spinal tenderness, no upper extremity lymphedema Neuro:  nonfocal, well oriented, appropriate affect Breasts: The right breast is benign. The left breast is status post lumpectomy and irradiation, and recent biopsy. I do not palpate a well-defined mass. Both axillae are benign.   LAB RESULTS:  CMP     Component Value Date/Time   NA 142 03/18/2017 1433   K 4.4 03/18/2017 1433   CO2 28 03/18/2017 1433   GLUCOSE 121 03/18/2017 1433   BUN 12.8 03/18/2017 1433   CREATININE 0.8 03/18/2017 1433   CALCIUM 9.5 03/18/2017 1433   PROT 7.3 03/18/2017 1433   ALBUMIN 3.8 03/18/2017 1433   AST 20 03/18/2017 1433   ALT 22 03/18/2017 1433   ALKPHOS 88 03/18/2017 1433   BILITOT 0.52  03/18/2017 1433    INo results found for: SPEP, UPEP  Lab Results  Component Value Date   WBC 8.8 03/18/2017   NEUTROABS 5.7 03/18/2017   HGB 12.6 03/18/2017   HCT 37.5 03/18/2017   MCV 90.8 03/18/2017   PLT 351 03/18/2017      Chemistry      Component Value Date/Time   NA 142 03/18/2017 1433   K 4.4 03/18/2017 1433   CO2 28 03/18/2017 1433   BUN 12.8 03/18/2017 1433   CREATININE 0.8 03/18/2017 1433      Component Value Date/Time   CALCIUM 9.5 03/18/2017 1433   ALKPHOS 88 03/18/2017 1433   AST 20 03/18/2017 1433   ALT 22 03/18/2017 1433   BILITOT 0.52 03/18/2017 1433       No results found for: LABCA2  No components found for: LABCA125  No results for input(s): INR in the last 168 hours.  Urinalysis    Component Value Date/Time   LABSPEC 1.020 05/30/2015 1502   PHURINE 6.0 05/30/2015 1502   GLUCOSEU Negative 05/30/2015 1502   HGBUR Small 05/30/2015 1502   BILIRUBINUR Negative 05/30/2015 1502   KETONESUR Negative 05/30/2015 1502   PROTEINUR Negative 05/30/2015 1502   UROBILINOGEN 0.2 05/30/2015 1502   NITRITE Negative 05/30/2015 1502   LEUKOCYTESUR Negative 05/30/2015 1502    STUDIES: US Breast Ltd Uni Left Inc Axilla  Result Date: 03/03/2017 CLINICAL DATA:  41 year old female presenting for routine annual evaluation status post left breast lumpectomy in 2017. EXAM: 2D DIGITAL DIAGNOSTIC BILATERAL MAMMOGRAM WITH CAD AND ADJUNCT TOMO LEFT BREAST ULTRASOUND COMPARISON:  Previous exam(s). ACR Breast Density Category c: The breast tissue is heterogeneously dense, which may obscure small masses. FINDINGS: The lumpectomy site in the upper-outer quadrant of the left breast appears mammographically stable. There is a new asymmetry in the slightly lower inner aspect of the left breast which is oval with indistinct margins. No other suspicious calcifications, masses or areas of distortion are seen in the bilateral breasts. Mammographic images were processed with CAD.  Ultrasound targeted to the left breast at 7 o'clock, 5 cm from the nipple demonstrates a hypoechoic oval predominantly circumscribed mass measuring 1.0 x 0.5 x 0.9 cm. This corresponds in size and location with the mass identified mammographically. IMPRESSION: There is a mass in the left breast at 7 o'clock which is favored to represent a cyst, however further workup with aspiration/biopsy is recommended as detailed below. RECOMMENDATION: Ultrasound-guided aspiration is recommended for the left breast cyst at 7 o'clock. A postprocedural mammogram should be performed to ensure that the mass identified mammographically also resolves. If the mass is still present on the post biopsy mammogram, a stereotactic biopsy should be performed. I have discussed the findings and recommendations with the patient. Results were also  provided in writing at the conclusion of the visit. If applicable, a reminder letter will be sent to the patient regarding the next appointment. BI-RADS CATEGORY  4: Suspicious. Electronically Signed   By: Ammie Ferrier M.D.   On: 03/03/2017 09:43   Mm Diag Breast Tomo Bilateral  Result Date: 03/03/2017 CLINICAL DATA:  41 year old female presenting for routine annual evaluation status post left breast lumpectomy in 2017. EXAM: 2D DIGITAL DIAGNOSTIC BILATERAL MAMMOGRAM WITH CAD AND ADJUNCT TOMO LEFT BREAST ULTRASOUND COMPARISON:  Previous exam(s). ACR Breast Density Category c: The breast tissue is heterogeneously dense, which may obscure small masses. FINDINGS: The lumpectomy site in the upper-outer quadrant of the left breast appears mammographically stable. There is a new asymmetry in the slightly lower inner aspect of the left breast which is oval with indistinct margins. No other suspicious calcifications, masses or areas of distortion are seen in the bilateral breasts. Mammographic images were processed with CAD. Ultrasound targeted to the left breast at 7 o'clock, 5 cm from the nipple  demonstrates a hypoechoic oval predominantly circumscribed mass measuring 1.0 x 0.5 x 0.9 cm. This corresponds in size and location with the mass identified mammographically. IMPRESSION: There is a mass in the left breast at 7 o'clock which is favored to represent a cyst, however further workup with aspiration/biopsy is recommended as detailed below. RECOMMENDATION: Ultrasound-guided aspiration is recommended for the left breast cyst at 7 o'clock. A postprocedural mammogram should be performed to ensure that the mass identified mammographically also resolves. If the mass is still present on the post biopsy mammogram, a stereotactic biopsy should be performed. I have discussed the findings and recommendations with the patient. Results were also provided in writing at the conclusion of the visit. If applicable, a reminder letter will be sent to the patient regarding the next appointment. BI-RADS CATEGORY  4: Suspicious. Electronically Signed   By: Ammie Ferrier M.D.   On: 03/03/2017 09:43   [place] Axilla Left  Result Date: 03/18/2017 CLINICAL DATA:  Recently diagnosed invasive mammary carcinoma of the left breast. Patient had a lumpectomy for previous breast carcinoma in 2016. EXAM: ULTRASOUND OF THE LEFT AXILLA COMPARISON:  None. FINDINGS: On physical exam,no suspicious masses are palpated. Postsurgical scarring from left axillary dissection is noted. Ultrasound is performed, showing no suspicious enlarged lymph nodes. No evidence of left axillary lymphadenopathy. IMPRESSION: No evidence of left axillary lymphadenopathy. RECOMMENDATION: Continue with plan of care for known left breast cancer. I have discussed the findings and recommendations with the patient. Results were also provided in writing at the conclusion of the visit. If applicable, a reminder letter will be sent to the patient regarding the next appointment. BI-RADS CATEGORY  6: Known biopsy-proven malignancy. Electronically Signed   By: Fidela Salisbury M.D.   On: 03/18/2017 10:10   Mm Clip Placement Left  Result Date: 03/08/2017 CLINICAL DATA:  Status post stereotactic guided core biopsy of mass in the lower inner quadrant of the left breast. EXAM: DIAGNOSTIC LEFT MAMMOGRAM POST STEREOTACTIC BIOPSY COMPARISON:  Previous exam(s). FINDINGS: Mammographic images were obtained following stereotactic guided biopsy of mass in the lower inner quadrant of the left breast. An X shaped clip is identified within the mass in the lower inner quadrant of the left breast following biopsy. IMPRESSION: Tissue marker clip in the expected location following biopsy. Final Assessment: Post Procedure Mammograms for Marker Placement Electronically Signed   By: Nolon Nations M.D.   On: 03/08/2017 16:28   Mm Lt Breast Bx W  Loc Dev 1st Lesion Image Bx Spec Stereo Guide  Addendum Date: 03/08/2017   ADDENDUM REPORT: 03/08/2017 11:50 ADDENDUM: Pathology revealed grade II invasive mammary carcinoma and mammary carcinoma in situ in the left breast. This was found to be concordant by Dr. Nolon Nations. Pathology results were discussed with the patient by telephone. The patient reported doing well after the biopsy with tenderness at the site. Post biopsy instructions and care were reviewed and questions were answered. The patient was encouraged to call [facility] for any additional concerns. Surgical consultation has been arranged with Dr. Adonis Housekeeper at [facility] on March 10, 2017. Pathology results reported by Susa Raring RN, BSN on 03/08/2017. Electronically Signed   By: Nolon Nations M.D.   On: 03/08/2017 11:50   Result Date: 03/08/2017 CLINICAL DATA:  Patient presents for stereotactic guided core biopsy of asymmetry in the left breast. EXAM: LEFT BREAST STEREOTACTIC CORE NEEDLE BIOPSY COMPARISON:  Previous exams. FINDINGS: Prior to the procedure, additional 2D/3D images are performed of the left breast which  confirm presence of asymmetric density in the lower inner quadrant of the left breast. The patient and I discussed the procedure of stereotactic-guided biopsy including benefits and alternatives. We discussed the high likelihood of a successful procedure. We discussed the risks of the procedure including infection, bleeding, tissue injury, clip migration, and inadequate sampling. Informed written consent was given. The usual time out protocol was performed immediately prior to the procedure. Using sterile technique and 1% Lidocaine as local anesthetic, under stereotactic guidance, a 9 gauge vacuum assisted device was used to perform core needle biopsy of mass in the lower inner quadrant of the left breast using a cephalad approach. Lesion quadrant: Lower inner quadrant of the left breast At the conclusion of the procedure, an X shaped marker clip was deployed into the biopsy cavity. Follow-up 2-view mammogram was performed and dictated separately. IMPRESSION: Stereotactic-guided biopsy of left breast mass. No apparent complications. Electronically Signed: By: Nolon Nations M.D. On: 03/05/2017 12:09   US Breast Aspiration Left  Result Date: 03/03/2017 CLINICAL DATA:  Attempted left breast aspiration EXAM: ULTRASOUND GUIDED LEFT BREAST CYST ASPIRATION COMPARISON:  Previous exams. PROCEDURE: Using sterile technique, 1% lidocaine, under direct ultrasound visualization, an attempted needle aspiration of 2 vague regions of decreased echogenicity was performed. Neither site aspirated. IMPRESSION: Ultrasound-guided attempted aspiration of 2 possible complicated cyst No apparent complications. RECOMMENDATIONS: The patient will proceed to stereotactic biopsy for the mammographically identified mass. Electronically Signed   By: Dorise Bullion III M.D   On: 03/03/2017 10:48    ASSESSMENT: 41 y.o. [place], [place] woman status post left breast upper outer quadrant biopsy 02/08/2015 for ductal carcinoma in situ, grade 2 or  3, strongly estrogen and progesterone receptor positive, with likely areas of microinvasion  (a) biopsy of an area in the left breast upper outer quadrant showed PASH  (b) biopsy of 2 additional questionable areas, one in each breast, showed bilateral fibroadenomas  (1) genetics testing March 2016 through the BreastNext gene panel offered by [employer] showed no deleterious mutations in ATM, BARD1, BRCA1, BRCA2, BRIP1, CDH1, CHEK2, MRE11A, MUTYH, NBN, NF1, PALB2, PTEN, RAD50, RAD51C, RAD51D, and TP53.  (2) status post left lumpectomy with sentinel lymph node sampling 03/27/2015 for a pT1c pN0, stage IA invasive ductal carcinoma, grade 3, triple negative, with an MIB-1 of 33%  (a) close margins were cleared with subsequent excision 04/05/2015.  (3) Oncotype DX score of 38 predicts a  risk of 26% outside the breast recurrence within 10 years if the patient's only systemic treatment is tamoxifen for 5 years  (4) adjuvant chemotherapy started 05/02/2015 consisting of doxorubicin and cyclophosphamide in dose dense fashion 4, completed 06/13/2015, followed by paclitaxel weekly 12.   (a) Paclitaxel stopped after only 2 cycles because of persistent neuropathy.  (5) adjuvant radiation completed 11/08 questionable fingers of his of 4 g packet/2016  (5) tamoxifen started 02/20/2015 (neoadjuvantly), discontinued 04/19/2015 so as not to overlap chemotherapy; resumed 02/07/2016, Discontinued April 2018 with disease recurrence  RECURRENT DISEASE: April 2018 (6) left breast lower outer quadrant biopsy 03/05/2017 shows invasive ductal carcinoma, grade 2, triple negative  PLAN: I spent approximately 45 minutes with Andee Poles going over her situation in detail. She has developed recurrent disease in the ipsilateral breast, which has already been irradiated. She understands the standard of care here is for mastectomy. She wishes to proceed with bilateral mastectomies with reconstruction and is already working  with Dr. [name] and Dr. Marla Roe to get that accomplished.  She understands that local recurrence can be associated with systemic disease. For that reason we are setting her up for a chest CT scan and bone scan which she understands I expect these to be negative since she gives me no symptoms suggestive of stage IV disease.  However she will need systemic therapy. We are stopping the tamoxifen, which was started as a preventative and because her original ductal carcinoma in situ was estrogen receptor positive. Since mastectomies cure ductal carcinoma in situ nearly 100% of the time, there is no further indication for tamoxifen.  Instead her systemic therapy will consist of chemotherapy. We discussed 2 possibilities. Normally we would do carboplatin and Taxotere, but since she had a taxanes previously I would favor carboplatin and gemcitabine. She would receive 4 cycles of this, which would take 12 weeks.  This is intensive therapy and we discussed the possible toxicities, side effects and complications.  Alternatively, she can consider CMF chemotherapy. This is considerably milder, but longer. She would receive 8 cycles, given every 3 weeks, which means 6 months of chemotherapy.  At this point she is not quite sure what she would choose. In any case she will benefit from a port or at a minimum a PICC line since her axis is problematic.  I have tentatively scheduled her to return to see me June 1, by which time I expect she will have sufficiently recovered from her surgery that we can start systemic therapy.  She knows to call for any problems that may develop before that visit.   [name], MD   03/21/2017 7:17 PM

## 2017-03-25 ENCOUNTER — Ambulatory Visit (HOSPITAL_COMMUNITY)
Admission: RE | Admit: 2017-03-25 | Discharge: 2017-03-25 | Disposition: A | Discharge status: Home / Self Care | Payer: 59 | Source: Ambulatory Visit | Attending: Oncology | Admitting: Oncology

## 2017-03-25 ENCOUNTER — Encounter (HOSPITAL_COMMUNITY): Payer: Self-pay

## 2017-03-25 ENCOUNTER — Encounter (HOSPITAL_COMMUNITY)
Admission: RE | Admit: 2017-03-25 | Discharge: 2017-03-25 | Disposition: A | Discharge status: Home / Self Care | Payer: 59 | Source: Ambulatory Visit | Attending: Oncology | Admitting: Oncology

## 2017-03-25 DIAGNOSIS — Z171 Estrogen receptor negative status [ER-]: Secondary | ICD-10-CM | POA: Diagnosis present

## 2017-03-25 DIAGNOSIS — C50912 Malignant neoplasm of unspecified site of left female breast: Secondary | ICD-10-CM

## 2017-03-25 DIAGNOSIS — C50412 Malignant neoplasm of upper-outer quadrant of left female breast: Secondary | ICD-10-CM | POA: Diagnosis present

## 2017-03-25 DIAGNOSIS — Z853 Personal history of malignant neoplasm of breast: Secondary | ICD-10-CM | POA: Insufficient documentation

## 2017-03-25 DIAGNOSIS — R918 Other nonspecific abnormal finding of lung field: Secondary | ICD-10-CM | POA: Insufficient documentation

## 2017-03-25 MED ORDER — TECHNETIUM TC 99M MEDRONATE IV KIT
25.0000 | PACK | Freq: Once | INTRAVENOUS | Status: AC | PRN
  Administered 2017-03-25: 21.5 via INTRAVENOUS

## 2017-03-25 MED ORDER — IOPAMIDOL (ISOVUE-300) INJECTION 61%
INTRAVENOUS | Status: AC
  Administered 2017-03-25: 75 mL
  Filled 2017-03-25: qty 75

## 2017-03-26 ENCOUNTER — Encounter: Payer: Self-pay | Admitting: Oncology

## 2017-03-30 ENCOUNTER — Encounter: Payer: Self-pay | Admitting: Oncology

## 2017-04-01 ENCOUNTER — Other Ambulatory Visit: Payer: Self-pay | Admitting: Oncology

## 2017-04-01 ENCOUNTER — Encounter: Payer: Self-pay | Admitting: Oncology

## 2017-04-01 ENCOUNTER — Telehealth: Payer: Self-pay

## 2017-04-01 DIAGNOSIS — C50412 Malignant neoplasm of upper-outer quadrant of left female breast: Secondary | ICD-10-CM

## 2017-04-01 DIAGNOSIS — Z171 Estrogen receptor negative status [ER-]: Principal | ICD-10-CM

## 2017-04-01 DIAGNOSIS — C50912 Malignant neoplasm of unspecified site of left female breast: Secondary | ICD-10-CM

## 2017-04-01 NOTE — Telephone Encounter (Signed)
Pt called requesting to speak with Dr Vinie Sill. Pt left 2 my chart messages 5/4 and 5/8 and has not gotten a response. She saw on mychart the CT chest results. [name] concerned about the hepatic lesion that was seen. [name] concerned that Dr Jana Hakim said to get scans in 3 months time, and did not mention the liver at all. [name] requesting a 2nd opinion here at [facility] or another facility.   A cousin in [place] who is an oncology nurse showed report to an oncologist there who said she needs to f/u on the liver.

## 2017-04-01 NOTE — Telephone Encounter (Signed)
I have given Dr Jana Hakim a printed copy of scan reports for his review.

## 2017-04-01 NOTE — Progress Notes (Unsigned)
I called Cheryl [name] and apologized for not having called her after receiving the CT scan of the chest report. I called her with a bone scan report and we discussed repeating that in about 3 months, which I think is a good plan.  The CT of the chest however showed a 2 cm liver lesion which is felt likely to be benign but which certainly needs to be further evaluated.  [name] already scheduled for surgery, and it will be important to have the MRI clarify the situation before she undergoes bilateral mastectomies.  I have gone ahead and placed the order written. I will call her with those results as soon as they are available.

## 2017-04-02 ENCOUNTER — Telehealth: Payer: Self-pay

## 2017-04-02 DIAGNOSIS — C50312 Malignant neoplasm of lower-inner quadrant of left female breast: Secondary | ICD-10-CM | POA: Diagnosis not present

## 2017-04-02 NOTE — Telephone Encounter (Signed)
LVM for pt regarding her scheduled MRI at [place] next week 5/15 at 3pm. Arrive 245pm and nothing to eat or drink 4 hrs prior to test. Check in at main hospital at [facility] 1st floor radiology.Call back number provided.

## 2017-04-05 ENCOUNTER — Telehealth: Payer: Self-pay

## 2017-04-05 NOTE — Telephone Encounter (Signed)
Spoke with pt by phone to confirm she is aware of MRI scheduled for tomorrow.  Pt states she is aware and also aware of NPO instructions

## 2017-04-06 ENCOUNTER — Ambulatory Visit (HOSPITAL_COMMUNITY)
Admission: RE | Admit: 2017-04-06 | Discharge: 2017-04-06 | Disposition: A | Discharge status: Home / Self Care | Payer: 59 | Source: Ambulatory Visit | Attending: Oncology | Admitting: Oncology

## 2017-04-06 DIAGNOSIS — C50912 Malignant neoplasm of unspecified site of left female breast: Secondary | ICD-10-CM

## 2017-04-06 DIAGNOSIS — Z171 Estrogen receptor negative status [ER-]: Secondary | ICD-10-CM | POA: Insufficient documentation

## 2017-04-06 DIAGNOSIS — C220 Liver cell carcinoma: Secondary | ICD-10-CM | POA: Diagnosis not present

## 2017-04-06 DIAGNOSIS — K769 Liver disease, unspecified: Secondary | ICD-10-CM | POA: Diagnosis not present

## 2017-04-06 DIAGNOSIS — K8020 Calculus of gallbladder without cholecystitis without obstruction: Secondary | ICD-10-CM | POA: Diagnosis not present

## 2017-04-06 DIAGNOSIS — C50412 Malignant neoplasm of upper-outer quadrant of left female breast: Secondary | ICD-10-CM

## 2017-04-06 LAB — CREATININE, SERUM: Creatinine, Ser: 0.84 mg/dL (ref 0.44–1.00)

## 2017-04-06 MED ORDER — GADOBENATE DIMEGLUMINE 529 MG/ML IV SOLN
20.0000 mL | Freq: Once | INTRAVENOUS | Status: AC | PRN
  Administered 2017-04-06: 20 mL via INTRAVENOUS

## 2017-04-07 ENCOUNTER — Encounter: Payer: Self-pay | Admitting: Oncology

## 2017-04-07 NOTE — Progress Notes (Addendum)
Error entry

## 2017-04-09 NOTE — Pre-Procedure Instructions (Signed)
Cheryl [name]  04/09/2017      [id number] [address] [place], [place] [address] Phone: [phone] Fax: [phone]  [id number] - [place], [place] [address] [place] [address] Phone: [phone] Fax: [phone]    Your procedure is scheduled on Apr 20, 2017.  Report to [facility] Admitting at 1000 AM.  Call this number if you have problems the morning of surgery:  [phone]   Remember:  Do not eat food or drink liquids after midnight.  Take these medicines the morning of surgery with A SIP OF WATER venlafazine XR (effexor-XR).  7 days prior to surgery STOP taking any Aspirin, Aleve, Naproxen, Ibuprofen, Motrin, Advil, Goody's, BC's, all herbal medications, fish oil, and all vitamins.   Do not wear jewelry, make-up or nail polish.  Do not wear lotions, powders, or perfumes, or deoderant.  Do not shave 48 hours prior to surgery.    Do not bring valuables to the hospital.  [facility] is not responsible for any belongings or valuables.  Contacts, dentures or bridgework may not be worn into surgery.  Leave your suitcase in the car.  After surgery it may be brought to your room.  For patients admitted to the hospital, discharge time will be determined by your treatment team.  Patients discharged the day of surgery will not be allowed to drive home.    Special instructions:   [facility]- Preparing For Surgery  Before surgery, you can play an important role. Because skin is not sterile, your skin needs to be as free of germs as possible. You can reduce the number of germs on your skin by washing with CHG (chlorahexidine gluconate) Soap before surgery.  CHG is an antiseptic cleaner which kills germs and bonds with the skin to continue killing germs even after washing.  Please do not use if you have an allergy to CHG or antibacterial soaps. If your skin becomes  reddened/irritated stop using the CHG.  Do not shave (including legs and underarms) for at least 48 hours prior to first CHG shower. It is OK to shave your face.  Please follow these instructions carefully.   1. Shower the NIGHT BEFORE SURGERY and the MORNING OF SURGERY with CHG.   2. If you chose to wash your hair, wash your hair first as usual with your normal shampoo.  3. After you shampoo, rinse your hair and body thoroughly to remove the shampoo.  4. Use CHG as you would any other liquid soap. You can apply CHG directly to the skin and wash gently with a scrungie or a clean washcloth.   5. Apply the CHG Soap to your body ONLY FROM THE NECK DOWN.  Do not use on open wounds or open sores. Avoid contact with your eyes, ears, mouth and genitals (private parts). Wash genitals (private parts) with your normal soap.  6. Wash thoroughly, paying special attention to the area where your surgery will be performed.  7. Thoroughly rinse your body with warm water from the neck down.  8. DO NOT shower/wash with your normal soap after using and rinsing off the CHG Soap.  9. Pat yourself dry with a CLEAN TOWEL.   10. Wear CLEAN PAJAMAS   11. Place CLEAN SHEETS on your bed the night of your first shower and DO NOT SLEEP WITH PETS.   Day of Surgery: Do not apply any deodorants/lotions. Please wear  clean clothes to the hospital/surgery center.     Please read over the following fact sheets that you were given. Pain Booklet, Coughing and Deep Breathing, MRSA Information and Surgical Site Infection Prevention

## 2017-04-12 ENCOUNTER — Encounter (HOSPITAL_COMMUNITY)
Admission: RE | Admit: 2017-04-12 | Discharge: 2017-04-12 | Disposition: A | Discharge status: Home / Self Care | Payer: 59 | Source: Ambulatory Visit | Attending: General Surgery | Admitting: General Surgery

## 2017-04-12 ENCOUNTER — Encounter (HOSPITAL_COMMUNITY): Payer: Self-pay | Admitting: *Deleted

## 2017-04-12 DIAGNOSIS — Z801 Family history of malignant neoplasm of trachea, bronchus and lung: Secondary | ICD-10-CM | POA: Diagnosis not present

## 2017-04-12 DIAGNOSIS — Z01812 Encounter for preprocedural laboratory examination: Secondary | ICD-10-CM | POA: Diagnosis not present

## 2017-04-12 DIAGNOSIS — C50312 Malignant neoplasm of lower-inner quadrant of left female breast: Secondary | ICD-10-CM | POA: Diagnosis not present

## 2017-04-12 DIAGNOSIS — Z9889 Other specified postprocedural states: Secondary | ICD-10-CM | POA: Diagnosis not present

## 2017-04-12 DIAGNOSIS — Z7984 Long term (current) use of oral hypoglycemic drugs: Secondary | ICD-10-CM | POA: Insufficient documentation

## 2017-04-12 LAB — BASIC METABOLIC PANEL
Anion gap: 13 (ref 5–15)
BUN: 10 mg/dL (ref 6–20)
CHLORIDE: 104 mmol/L (ref 101–111)
CO2: 22 mmol/L (ref 22–32)
CREATININE: 0.84 mg/dL (ref 0.44–1.00)
Calcium: 9.3 mg/dL (ref 8.9–10.3)
GFR calc Af Amer: >60 mL/min (ref >60)
GFR calc non Af Amer: >60 mL/min (ref >60)
Glucose, Bld: 113 mg/dL — ABNORMAL HIGH (ref 65–99)
Potassium: 3.6 mmol/L (ref 3.5–5.1)
Sodium: 139 mmol/L (ref 135–145)

## 2017-04-12 LAB — CBC
HCT: 39.8 % (ref 36.0–46.0)
HEMOGLOBIN: 12.9 g/dL (ref 12.0–15.0)
MCH: 29.5 pg (ref 26.0–34.0)
MCHC: 32.4 g/dL (ref 30.0–36.0)
MCV: 90.9 fL (ref 78.0–100.0)
Platelets: 320 10*3/uL (ref 150–400)
RBC: 4.38 MIL/uL (ref 3.87–5.11)
RDW: 13.2 % (ref 11.5–15.5)
WBC: 6.8 10*3/uL (ref 4.0–10.5)

## 2017-04-12 LAB — HCG, SERUM, QUALITATIVE: PREG SERUM: NEGATIVE

## 2017-04-12 MED ORDER — CHLORHEXIDINE GLUCONATE CLOTH 2 % EX PADS: 6.0000 | MEDICATED_PAD | Freq: Once | CUTANEOUS | Status: DC

## 2017-04-16 ENCOUNTER — Ambulatory Visit: Payer: Self-pay | Admitting: Plastic Surgery

## 2017-04-16 DIAGNOSIS — C50919 Malignant neoplasm of unspecified site of unspecified female breast: Secondary | ICD-10-CM

## 2017-04-20 ENCOUNTER — Encounter (HOSPITAL_COMMUNITY)
Admission: RE | Disposition: A | Discharge status: Home / Self Care | Payer: Self-pay | Source: Ambulatory Visit | Attending: Plastic Surgery

## 2017-04-20 ENCOUNTER — Inpatient Hospital Stay (HOSPITAL_COMMUNITY): Payer: 59

## 2017-04-20 ENCOUNTER — Observation Stay (HOSPITAL_COMMUNITY)
Admission: RE | Admit: 2017-04-20 | Discharge: 2017-04-21 | Disposition: A | Discharge status: Home / Self Care | Payer: 59 | Source: Ambulatory Visit | Attending: Plastic Surgery | Admitting: Plastic Surgery

## 2017-04-20 ENCOUNTER — Inpatient Hospital Stay (HOSPITAL_COMMUNITY): Payer: 59 | Admitting: Certified Registered"

## 2017-04-20 ENCOUNTER — Encounter (HOSPITAL_COMMUNITY)
Admission: RE | Admit: 2017-04-20 | Discharge: 2017-04-20 | Disposition: A | Discharge status: Home / Self Care | Payer: 59 | Source: Ambulatory Visit | Attending: General Surgery | Admitting: General Surgery

## 2017-04-20 DIAGNOSIS — C50912 Malignant neoplasm of unspecified site of left female breast: Secondary | ICD-10-CM

## 2017-04-20 DIAGNOSIS — N6081 Other benign mammary dysplasias of right breast: Secondary | ICD-10-CM | POA: Diagnosis not present

## 2017-04-20 DIAGNOSIS — Z171 Estrogen receptor negative status [ER-]: Principal | ICD-10-CM

## 2017-04-20 DIAGNOSIS — C50312 Malignant neoplasm of lower-inner quadrant of left female breast: Principal | ICD-10-CM | POA: Insufficient documentation

## 2017-04-20 DIAGNOSIS — N6011 Diffuse cystic mastopathy of right breast: Secondary | ICD-10-CM | POA: Diagnosis not present

## 2017-04-20 DIAGNOSIS — Z95828 Presence of other vascular implants and grafts: Secondary | ICD-10-CM

## 2017-04-20 DIAGNOSIS — K219 Gastro-esophageal reflux disease without esophagitis: Secondary | ICD-10-CM | POA: Diagnosis not present

## 2017-04-20 DIAGNOSIS — N390 Urinary tract infection, site not specified: Secondary | ICD-10-CM | POA: Diagnosis not present

## 2017-04-20 DIAGNOSIS — R109 Unspecified abdominal pain: Secondary | ICD-10-CM | POA: Diagnosis not present

## 2017-04-20 DIAGNOSIS — Z419 Encounter for procedure for purposes other than remedying health state, unspecified: Secondary | ICD-10-CM

## 2017-04-20 DIAGNOSIS — C50412 Malignant neoplasm of upper-outer quadrant of left female breast: Secondary | ICD-10-CM | POA: Diagnosis not present

## 2017-04-20 DIAGNOSIS — C50919 Malignant neoplasm of unspecified site of unspecified female breast: Secondary | ICD-10-CM | POA: Diagnosis present

## 2017-04-20 DIAGNOSIS — C50911 Malignant neoplasm of unspecified site of right female breast: Secondary | ICD-10-CM | POA: Diagnosis not present

## 2017-04-20 DIAGNOSIS — Z4682 Encounter for fitting and adjustment of non-vascular catheter: Secondary | ICD-10-CM | POA: Diagnosis not present

## 2017-04-20 DIAGNOSIS — R921 Mammographic calcification found on diagnostic imaging of breast: Secondary | ICD-10-CM | POA: Diagnosis not present

## 2017-04-20 HISTORY — PX: PORTACATH PLACEMENT: SHX2246

## 2017-04-20 HISTORY — PX: BILATERAL TOTAL MASTECTOMY WITH AXILLARY LYMPH NODE DISSECTION: SHX6364

## 2017-04-20 HISTORY — PX: BREAST RECONSTRUCTION WITH PLACEMENT OF TISSUE EXPANDER AND FLEX HD (ACELLULAR HYDRATED DERMIS): SHX6295

## 2017-04-20 HISTORY — PX: MASTECTOMY W/ SENTINEL NODE BIOPSY: SHX2001

## 2017-04-20 SURGERY — MASTECTOMY WITH SENTINEL LYMPH NODE BIOPSY
Anesthesia: General | Site: Chest | Laterality: Right

## 2017-04-20 MED ORDER — ONDANSETRON HCL 4 MG/2ML IJ SOLN
4.0000 mg | Freq: Four times a day (QID) | INTRAMUSCULAR | Status: DC | PRN
  Administered 2017-04-20: 4 mg via INTRAVENOUS
  Filled 2017-04-20: qty 2

## 2017-04-20 MED ORDER — SODIUM CHLORIDE 0.9 % IJ SOLN
INTRAMUSCULAR | Status: DC | PRN
  Administered 2017-04-20: 500 mL

## 2017-04-20 MED ORDER — FENTANYL CITRATE (PF) 100 MCG/2ML IJ SOLN
INTRAMUSCULAR | Status: AC
  Administered 2017-04-20: 100 ug
  Filled 2017-04-20: qty 2

## 2017-04-20 MED ORDER — CELECOXIB 200 MG PO CAPS
400.0000 mg | ORAL_CAPSULE | ORAL | Status: AC
  Administered 2017-04-20: 400 mg via ORAL
  Filled 2017-04-20: qty 2

## 2017-04-20 MED ORDER — BUPIVACAINE HCL (PF) 0.5 % IJ SOLN
INTRAMUSCULAR | Status: AC
  Filled 2017-04-20: qty 30

## 2017-04-20 MED ORDER — GABAPENTIN 300 MG PO CAPS
300.0000 mg | ORAL_CAPSULE | ORAL | Status: AC
  Administered 2017-04-20: 300 mg via ORAL
  Filled 2017-04-20: qty 1

## 2017-04-20 MED ORDER — HEPARIN SOD (PORK) LOCK FLUSH 100 UNIT/ML IV SOLN
INTRAVENOUS | Status: AC
  Filled 2017-04-20: qty 5

## 2017-04-20 MED ORDER — FENTANYL CITRATE (PF) 250 MCG/5ML IJ SOLN
INTRAMUSCULAR | Status: AC
  Filled 2017-04-20: qty 5

## 2017-04-20 MED ORDER — HYDROMORPHONE HCL 1 MG/ML IJ SOLN
1.0000 mg | INTRAMUSCULAR | Status: DC | PRN
  Administered 2017-04-20 – 2017-04-21 (×3): 1 mg via INTRAVENOUS
  Filled 2017-04-20 (×4): qty 1

## 2017-04-20 MED ORDER — CEFAZOLIN SODIUM-DEXTROSE 2-4 GM/100ML-% IV SOLN
2.0000 g | Freq: Three times a day (TID) | INTRAVENOUS | Status: DC
Start: 2017-04-20 — End: 2017-04-21
  Administered 2017-04-20 – 2017-04-21 (×2): 2 g via INTRAVENOUS
  Filled 2017-04-20 (×4): qty 100

## 2017-04-20 MED ORDER — 0.9 % SODIUM CHLORIDE (POUR BTL) OPTIME
TOPICAL | Status: DC | PRN
  Administered 2017-04-20: 2000 mL

## 2017-04-20 MED ORDER — CEFAZOLIN SODIUM-DEXTROSE 2-4 GM/100ML-% IV SOLN
2.0000 g | INTRAVENOUS | Status: AC
  Administered 2017-04-20 (×2): 2 g via INTRAVENOUS
  Filled 2017-04-20: qty 100

## 2017-04-20 MED ORDER — POLYETHYLENE GLYCOL 3350 17 G PO PACK
17.0000 g | PACK | Freq: Every day | ORAL | Status: DC | PRN
  Administered 2017-04-21: 17 g via ORAL
  Filled 2017-04-20: qty 1

## 2017-04-20 MED ORDER — ACETAMINOPHEN 500 MG PO TABS
1000.0000 mg | ORAL_TABLET | ORAL | Status: AC
  Administered 2017-04-20: 1000 mg via ORAL
  Filled 2017-04-20: qty 2

## 2017-04-20 MED ORDER — TECHNETIUM TC 99M SULFUR COLLOID FILTERED
1.0000 | Freq: Once | INTRAVENOUS | Status: AC | PRN
  Administered 2017-04-20: 1 via INTRADERMAL

## 2017-04-20 MED ORDER — BISACODYL 10 MG RE SUPP: 10.0000 mg | Freq: Every day | RECTAL | Status: DC | PRN

## 2017-04-20 MED ORDER — SODIUM BICARBONATE 4 % IV SOLN
INTRAVENOUS | Status: AC
  Filled 2017-04-20: qty 5

## 2017-04-20 MED ORDER — SUGAMMADEX SODIUM 200 MG/2ML IV SOLN
INTRAVENOUS | Status: DC | PRN
  Administered 2017-04-20: 200 mg via INTRAVENOUS

## 2017-04-20 MED ORDER — ACETAMINOPHEN 500 MG PO TABS
1000.0000 mg | ORAL_TABLET | Freq: Four times a day (QID) | ORAL | Status: DC
  Administered 2017-04-21: 1000 mg via ORAL
  Filled 2017-04-20: qty 2

## 2017-04-20 MED ORDER — PROPOFOL 10 MG/ML IV BOLUS
INTRAVENOUS | Status: DC | PRN
  Administered 2017-04-20: 40 mg via INTRAVENOUS
  Administered 2017-04-20: 200 mg via INTRAVENOUS

## 2017-04-20 MED ORDER — MIDAZOLAM HCL 2 MG/2ML IJ SOLN
INTRAMUSCULAR | Status: AC
  Filled 2017-04-20: qty 2

## 2017-04-20 MED ORDER — ONDANSETRON HCL 4 MG/2ML IJ SOLN
INTRAMUSCULAR | Status: AC
  Filled 2017-04-20: qty 2

## 2017-04-20 MED ORDER — ROCURONIUM BROMIDE 10 MG/ML (PF) SYRINGE
PREFILLED_SYRINGE | INTRAVENOUS | Status: AC
  Filled 2017-04-20: qty 10

## 2017-04-20 MED ORDER — ONDANSETRON 4 MG PO TBDP: 4.0000 mg | ORAL_TABLET | Freq: Four times a day (QID) | ORAL | Status: DC | PRN

## 2017-04-20 MED ORDER — FENTANYL CITRATE (PF) 100 MCG/2ML IJ SOLN
INTRAMUSCULAR | Status: DC | PRN
  Administered 2017-04-20 (×15): 50 ug via INTRAVENOUS

## 2017-04-20 MED ORDER — FENTANYL CITRATE (PF) 100 MCG/2ML IJ SOLN
25.0000 ug | INTRAMUSCULAR | Status: DC | PRN
  Administered 2017-04-20 (×3): 50 ug via INTRAVENOUS

## 2017-04-20 MED ORDER — PHENYLEPHRINE 40 MCG/ML (10ML) SYRINGE FOR IV PUSH (FOR BLOOD PRESSURE SUPPORT)
PREFILLED_SYRINGE | INTRAVENOUS | Status: DC | PRN
  Administered 2017-04-20: 120 ug via INTRAVENOUS
  Administered 2017-04-20 (×2): 40 ug via INTRAVENOUS

## 2017-04-20 MED ORDER — LIDOCAINE HCL 1 % IJ SOLN
INTRAMUSCULAR | Status: AC
  Filled 2017-04-20: qty 20

## 2017-04-20 MED ORDER — LIDOCAINE HCL (CARDIAC) 20 MG/ML IV SOLN
INTRAVENOUS | Status: DC | PRN
  Administered 2017-04-20: 100 mg via INTRAVENOUS

## 2017-04-20 MED ORDER — LACTATED RINGERS IV SOLN
INTRAVENOUS | Status: DC | PRN
  Administered 2017-04-20 (×4): via INTRAVENOUS

## 2017-04-20 MED ORDER — FENTANYL CITRATE (PF) 100 MCG/2ML IJ SOLN
INTRAMUSCULAR | Status: AC
  Administered 2017-04-20: 50 ug via INTRAVENOUS
  Filled 2017-04-20: qty 2

## 2017-04-20 MED ORDER — SODIUM CHLORIDE 0.9 % IJ SOLN
INTRAMUSCULAR | Status: AC
  Filled 2017-04-20: qty 10

## 2017-04-20 MED ORDER — CEFAZOLIN SODIUM-DEXTROSE 2-4 GM/100ML-% IV SOLN: 2.0000 g | INTRAVENOUS | Status: DC

## 2017-04-20 MED ORDER — DIAZEPAM 2 MG PO TABS
2.0000 mg | ORAL_TABLET | Freq: Two times a day (BID) | ORAL | Status: DC | PRN
  Administered 2017-04-20: 2 mg via ORAL
  Filled 2017-04-20: qty 1

## 2017-04-20 MED ORDER — LACTATED RINGERS IV SOLN
INTRAVENOUS | Status: DC
  Administered 2017-04-20: 12:00:00 via INTRAVENOUS

## 2017-04-20 MED ORDER — PROPOFOL 10 MG/ML IV BOLUS
INTRAVENOUS | Status: AC
  Filled 2017-04-20: qty 20

## 2017-04-20 MED ORDER — SODIUM CHLORIDE 0.9 % IJ SOLN
INTRAVENOUS | Status: DC | PRN
  Administered 2017-04-20: 13:00:00 via INTRAMUSCULAR

## 2017-04-20 MED ORDER — LIDOCAINE 2% (20 MG/ML) 5 ML SYRINGE
INTRAMUSCULAR | Status: AC
  Filled 2017-04-20: qty 10

## 2017-04-20 MED ORDER — METHYLENE BLUE 0.5 % INJ SOLN
INTRAVENOUS | Status: AC
  Filled 2017-04-20: qty 10

## 2017-04-20 MED ORDER — HEPARIN SODIUM (PORCINE) 5000 UNIT/ML IJ SOLN
INTRAMUSCULAR | Status: DC | PRN
  Administered 2017-04-20: 13:00:00

## 2017-04-20 MED ORDER — SENNA 8.6 MG PO TABS
1.0000 | ORAL_TABLET | Freq: Two times a day (BID) | ORAL | Status: DC
  Filled 2017-04-20 (×2): qty 1

## 2017-04-20 MED ORDER — ZOLPIDEM TARTRATE 5 MG PO TABS: 5.0000 mg | ORAL_TABLET | Freq: Every evening | ORAL | Status: DC | PRN

## 2017-04-20 MED ORDER — MIDAZOLAM HCL 5 MG/5ML IJ SOLN
INTRAMUSCULAR | Status: DC | PRN
  Administered 2017-04-20: 2 mg via INTRAVENOUS

## 2017-04-20 MED ORDER — CEFAZOLIN SODIUM 1 G IJ SOLR
INTRAMUSCULAR | Status: AC
  Filled 2017-04-20: qty 20

## 2017-04-20 MED ORDER — BUPIVACAINE HCL (PF) 0.25 % IJ SOLN
INTRAMUSCULAR | Status: AC
  Filled 2017-04-20: qty 10

## 2017-04-20 MED ORDER — SUGAMMADEX SODIUM 200 MG/2ML IV SOLN
INTRAVENOUS | Status: AC
  Filled 2017-04-20: qty 2

## 2017-04-20 MED ORDER — ROCURONIUM BROMIDE 100 MG/10ML IV SOLN
INTRAVENOUS | Status: DC | PRN
  Administered 2017-04-20 (×4): 20 mg via INTRAVENOUS
  Administered 2017-04-20: 60 mg via INTRAVENOUS
  Administered 2017-04-20: 10 mg via INTRAVENOUS
  Administered 2017-04-20: 20 mg via INTRAVENOUS

## 2017-04-20 MED ORDER — HYDROCODONE-ACETAMINOPHEN 5-325 MG PO TABS
ORAL_TABLET | ORAL | Status: AC
  Administered 2017-04-20: 2 via ORAL
  Filled 2017-04-20: qty 2

## 2017-04-20 MED ORDER — DIPHENHYDRAMINE HCL 50 MG/ML IJ SOLN: 12.5000 mg | Freq: Four times a day (QID) | INTRAMUSCULAR | Status: DC | PRN

## 2017-04-20 MED ORDER — MIDAZOLAM HCL 2 MG/2ML IJ SOLN
INTRAMUSCULAR | Status: AC
  Administered 2017-04-20: 2 mg
  Filled 2017-04-20: qty 2

## 2017-04-20 MED ORDER — KCL IN DEXTROSE-NACL 10-5-0.45 MEQ/L-%-% IV SOLN
INTRAVENOUS | Status: DC
  Administered 2017-04-20 – 2017-04-21 (×2): via INTRAVENOUS
  Filled 2017-04-20 (×3): qty 1000

## 2017-04-20 MED ORDER — CHLORHEXIDINE GLUCONATE CLOTH 2 % EX PADS: 6.0000 | MEDICATED_PAD | Freq: Once | CUTANEOUS | Status: DC

## 2017-04-20 MED ORDER — IOPAMIDOL (ISOVUE-300) INJECTION 61%
INTRAVENOUS | Status: AC
  Filled 2017-04-20: qty 50

## 2017-04-20 MED ORDER — DIPHENHYDRAMINE HCL 12.5 MG/5ML PO ELIX: 12.5000 mg | ORAL_SOLUTION | Freq: Four times a day (QID) | ORAL | Status: DC | PRN

## 2017-04-20 MED ORDER — HYDROCODONE-ACETAMINOPHEN 5-325 MG PO TABS
1.0000 | ORAL_TABLET | ORAL | Status: DC | PRN
  Administered 2017-04-20 – 2017-04-21 (×3): 2 via ORAL
  Filled 2017-04-20 (×2): qty 2

## 2017-04-20 MED ORDER — ROCURONIUM BROMIDE 10 MG/ML (PF) SYRINGE
PREFILLED_SYRINGE | INTRAVENOUS | Status: AC
  Filled 2017-04-20: qty 5

## 2017-04-20 MED ORDER — ONDANSETRON HCL 4 MG/2ML IJ SOLN
INTRAMUSCULAR | Status: DC | PRN
  Administered 2017-04-20: 4 mg via INTRAVENOUS

## 2017-04-20 MED ORDER — HEPARIN SOD (PORK) LOCK FLUSH 100 UNIT/ML IV SOLN
INTRAVENOUS | Status: DC | PRN
  Administered 2017-04-20: 500 [IU] via INTRAVENOUS

## 2017-04-20 MED ORDER — NAPROXEN 250 MG PO TABS
500.0000 mg | ORAL_TABLET | Freq: Two times a day (BID) | ORAL | Status: DC | PRN
  Administered 2017-04-21: 500 mg via ORAL
  Filled 2017-04-20: qty 2

## 2017-04-20 SURGICAL SUPPLY — 101 items
BAG DECANTER FOR FLEXI CONT (MISCELLANEOUS) ×6 IMPLANT
BINDER BREAST LRG (GAUZE/BANDAGES/DRESSINGS) IMPLANT
BINDER BREAST XLRG (GAUZE/BANDAGES/DRESSINGS) ×3 IMPLANT
BIOPATCH RED 1 DISK 7.0 (GAUZE/BANDAGES/DRESSINGS) ×6 IMPLANT
BLADE 15 SAFETY STRL DISP (BLADE) ×3 IMPLANT
CANISTER SUCT 3000ML PPV (MISCELLANEOUS) ×9 IMPLANT
CHLORAPREP W/TINT 10.5 ML (MISCELLANEOUS) ×3 IMPLANT
CHLORAPREP W/TINT 26ML (MISCELLANEOUS) ×6 IMPLANT
CLIP TI MEDIUM 6 (CLIP) ×9 IMPLANT
CONT SPEC 4OZ CLIKSEAL STRL BL (MISCELLANEOUS) ×9 IMPLANT
COVER PROBE W GEL 5X96 (DRAPES) ×6 IMPLANT
COVER SURGICAL LIGHT HANDLE (MISCELLANEOUS) ×6 IMPLANT
COVER TRANSDUCER ULTRASND GEL (DRAPE) IMPLANT
CRADLE DONUT ADULT HEAD (MISCELLANEOUS) ×3 IMPLANT
DECANTER SPIKE VIAL GLASS SM (MISCELLANEOUS) IMPLANT
DERMABOND ADVANCED (GAUZE/BANDAGES/DRESSINGS) ×3
DERMABOND ADVANCED .7 DNX12 (GAUZE/BANDAGES/DRESSINGS) ×6 IMPLANT
DEVICE DISSECT PLASMABLAD 3.0S (MISCELLANEOUS) ×4 IMPLANT
DRAIN CHANNEL 19F RND (DRAIN) ×12 IMPLANT
DRAPE C-ARM 42X72 X-RAY (DRAPES) ×3 IMPLANT
DRAPE CHEST BREAST 15X10 FENES (DRAPES) ×6 IMPLANT
DRAPE HALF SHEET 40X57 (DRAPES) ×3 IMPLANT
DRAPE ORTHO SPLIT 77X108 STRL (DRAPES) ×2
DRAPE SURG 17X23 STRL (DRAPES) ×24 IMPLANT
DRAPE SURG ORHT 6 SPLT 77X108 (DRAPES) ×4 IMPLANT
DRAPE UTILITY XL STRL (DRAPES) ×3 IMPLANT
DRAPE WARM FLUID 44X44 (DRAPE) ×3 IMPLANT
DRSG MEPITEL 4X7.2 (GAUZE/BANDAGES/DRESSINGS) ×3 IMPLANT
DRSG PAD ABDOMINAL 8X10 ST (GAUZE/BANDAGES/DRESSINGS) ×12 IMPLANT
DRSG TEGADERM 2-3/8X2-3/4 SM (GAUZE/BANDAGES/DRESSINGS) ×3 IMPLANT
ELECT BLADE 4.0 EZ CLEAN MEGAD (MISCELLANEOUS)
ELECT CAUTERY BLADE 6.4 (BLADE) ×3 IMPLANT
ELECT REM PT RETURN 9FT ADLT (ELECTROSURGICAL) ×9
ELECTRODE BLDE 4.0 EZ CLN MEGD (MISCELLANEOUS) IMPLANT
ELECTRODE REM PT RTRN 9FT ADLT (ELECTROSURGICAL) ×6 IMPLANT
EVACUATOR SILICONE 100CC (DRAIN) ×6 IMPLANT
GAUZE SPONGE 4X4 12PLY STRL (GAUZE/BANDAGES/DRESSINGS) ×3 IMPLANT
GAUZE SPONGE 4X4 16PLY XRAY LF (GAUZE/BANDAGES/DRESSINGS) ×3 IMPLANT
GEL ULTRASOUND 20GR AQUASONIC (MISCELLANEOUS) IMPLANT
GLOVE BIO SURGEON STRL SZ 6.5 (GLOVE) ×6 IMPLANT
GLOVE BIO SURGEON STRL SZ8 (GLOVE) ×3 IMPLANT
GLOVE BIOGEL PI IND STRL 7.0 (GLOVE) ×2 IMPLANT
GLOVE BIOGEL PI IND STRL 8 (GLOVE) ×6 IMPLANT
GLOVE BIOGEL PI INDICATOR 7.0 (GLOVE) ×1
GLOVE BIOGEL PI INDICATOR 8 (GLOVE) ×3
GLOVE ECLIPSE 7.5 STRL STRAW (GLOVE) ×3 IMPLANT
GLOVE ECLIPSE 8.0 STRL XLNG CF (GLOVE) ×3 IMPLANT
GOWN STRL REUS W/ TWL LRG LVL3 (GOWN DISPOSABLE) ×6 IMPLANT
GOWN STRL REUS W/ TWL XL LVL3 (GOWN DISPOSABLE) ×2 IMPLANT
GOWN STRL REUS W/TWL LRG LVL3 (GOWN DISPOSABLE) ×3
GOWN STRL REUS W/TWL XL LVL3 (GOWN DISPOSABLE) ×1
GRAFT FLEX HD 4X16 THICK (Tissue Mesh) ×6 IMPLANT
IMPL BREAST 300CC (Breast) ×4 IMPLANT
IMPLANT BREAST 300CC (Breast) ×6 IMPLANT
INTRODUCER COOK 11FR (CATHETERS) IMPLANT
IV CATH 14GX2 1/4 (CATHETERS) IMPLANT
KIT BASIN OR (CUSTOM PROCEDURE TRAY) ×6 IMPLANT
KIT PORT POWER 8FR ISP CVUE (Catheter) ×3 IMPLANT
KIT ROOM TURNOVER OR (KITS) ×6 IMPLANT
NEEDLE 18GX1X1/2 (RX/OR ONLY) (NEEDLE) ×3 IMPLANT
NEEDLE 22X1 1/2 (OR ONLY) (NEEDLE) ×3 IMPLANT
NEEDLE BLUNT 16X1.5 OR ONLY (NEEDLE) IMPLANT
NEEDLE HYPO 25GX1X1/2 BEV (NEEDLE) ×3 IMPLANT
NS IRRIG 1000ML POUR BTL (IV SOLUTION) ×9 IMPLANT
PACK GENERAL/GYN (CUSTOM PROCEDURE TRAY) ×6 IMPLANT
PACK SURGICAL SETUP 50X90 (CUSTOM PROCEDURE TRAY) ×3 IMPLANT
PAD ARMBOARD 7.5X6 YLW CONV (MISCELLANEOUS) ×6 IMPLANT
PENCIL BUTTON HOLSTER BLD 10FT (ELECTRODE) ×3 IMPLANT
PIN SAFETY STERILE (MISCELLANEOUS) ×6 IMPLANT
PLASMABLADE 3.0S (MISCELLANEOUS) ×6
SET ASEPTIC TRANSFER (MISCELLANEOUS) ×3 IMPLANT
SET COLLECT BLD 21X3/4 12 (NEEDLE) ×3 IMPLANT
SET INTRODUCER 12FR PACEMAKER (SHEATH) IMPLANT
SET SHEATH INTRODUCER 10FR (MISCELLANEOUS) IMPLANT
SHEATH COOK PEEL AWAY SET 9F (SHEATH) IMPLANT
SPECIMEN JAR X LARGE (MISCELLANEOUS) ×3 IMPLANT
SPONGE LAP 18X18 X RAY DECT (DISPOSABLE) ×9 IMPLANT
STAPLER VISISTAT 35W (STAPLE) ×3 IMPLANT
SUT ETHILON 2 0 FS 18 (SUTURE) ×3 IMPLANT
SUT MNCRL AB 4-0 PS2 18 (SUTURE) ×6 IMPLANT
SUT MON AB 3-0 SH 27 (SUTURE) ×8
SUT MON AB 3-0 SH27 (SUTURE) ×16 IMPLANT
SUT MON AB 5-0 PS2 18 (SUTURE) ×6 IMPLANT
SUT PDS AB 2-0 CT1 27 (SUTURE) ×18 IMPLANT
SUT PROLENE 2 0 SH DA (SUTURE) ×3 IMPLANT
SUT SILK 2 0 (SUTURE)
SUT SILK 2 0 SH (SUTURE) ×3 IMPLANT
SUT SILK 2-0 18XBRD TIE 12 (SUTURE) IMPLANT
SUT SILK 4 0 PS 2 (SUTURE) ×3 IMPLANT
SUT VIC AB 3-0 54X BRD REEL (SUTURE) ×2 IMPLANT
SUT VIC AB 3-0 BRD 54 (SUTURE) ×1
SUT VIC AB 3-0 SH 18 (SUTURE) ×3 IMPLANT
SYR 20ML ECCENTRIC (SYRINGE) ×6 IMPLANT
SYR 5ML LUER SLIP (SYRINGE) ×3 IMPLANT
SYR BULB 3OZ (MISCELLANEOUS) ×3 IMPLANT
SYR CONTROL 10ML LL (SYRINGE) ×6 IMPLANT
TOWEL OR 17X24 6PK STRL BLUE (TOWEL DISPOSABLE) ×6 IMPLANT
TOWEL OR 17X26 10 PK STRL BLUE (TOWEL DISPOSABLE) ×6 IMPLANT
TRAY FOLEY W/METER SILVER 14FR (SET/KITS/TRAYS/PACK) IMPLANT
TUBE CONNECTING 12X1/4 (SUCTIONS) ×3 IMPLANT
TUBE CONNECTING 20X1/4 (TUBING) ×3 IMPLANT

## 2017-04-20 NOTE — Interval H&P Note (Signed)
History and Physical Interval Note:  04/20/2017 11:28 AM  Cheryl [name]  has presented today for surgery, with the diagnosis of Cancer left breast  The various methods of treatment have been discussed with the patient and family. After consideration of risks, benefits and other options for treatment, the patient has consented to  Procedure(s): BILATERAL TOTAL MASTECTOMY WITH LEFT AXILLARY SENTINEL LYMPH NODE BIOPSY (Bilateral) INSERTION PORT-A-CATH (N/A) BILATARAL BREAST RECONSTRUCTION WITH PLACEMENT OF TISSUE EXPANDER AND FLEX HD (ACELLULAR HYDRATED DERMIS) (Bilateral) as a surgical intervention .  The patient's history has been reviewed, patient examined, no change in status, stable for surgery.  I have reviewed the patient's chart and labs.  Questions were answered to the patient's satisfaction.     [name]

## 2017-04-20 NOTE — Anesthesia Procedure Notes (Addendum)
Anesthesia Regional Block: Pectoralis block   Pre-Anesthetic Checklist: ,, timeout performed, Correct Patient, Correct Site, Correct Laterality, Correct Procedure, Correct Position, site marked, Risks and benefits discussed, Surgical consent,  Pre-op evaluation,  At surgeon's request  Laterality: Right and Left  Prep: chloraprep       Needles:   Needle Type: Stimulator Needle - 80          Additional Needles:   Procedures: Doppler guided, Ultrasound guided,,,,,,  Narrative:  Start time: 04/22/2017 12:05 PM End time: 04/22/2017 12:30 PM Injection made incrementally with aspirations every 5 mL. Anesthesiologist: Naszir Cott

## 2017-04-20 NOTE — H&P (Signed)
Cheryl [name] is an 41 y.o. female.   Chief Complaint: breast cancer HPI: Cheryl [name] is a 41 yo female here for immediate bilateral breast reconstruction.  She had two lumpectomies on the LEFT breast in 2016 followed by radiation.  She now has a new triple negative grade 2 invasive ductal carcinoma of the lower inner left breast. She underwent screening mammography 01/2015 which showed calcifications in the upper outer quadrant of the LEFT brast.  A diagnostic mammography with tomo followed by U/S was done with a biopsy showing ductal carcinoma in situ, grade 2 or 3 with possible microinvasion and ER/PR positive tumor. Bilateral breast MRI confirmed an upper outer quadrant lesion 4.5 cm with mass like enhancement and spiculation.  A RIGHT breast outer anterior lesion was found and biopsy showed a fibroadenoma.  The patient is 5 feet 2 inches tall, weight is 212 pounds. Preop bra= 38DD.  The NAC are grade 2 ptosis and outward.   Past Medical History     Past Medical History:  Diagnosis Date  . Breast cancer ([facility])    left breast  . Wears contact lenses     Past Surgical History:  Procedure Laterality Date  . BREAST BIOPSY Left 02/18/2015  . BREAST BIOPSY Right 02/18/2015  . BREAST EXCISIONAL BIOPSY Left 04/05/2015  . BREAST LUMPECTOMY Left 03/27/2015  . BREAST LUMPECTOMY WITH NEEDLE LOCALIZATION AND AXILLARY SENTINEL LYMPH NODE BX Left 03/27/2015   Procedure: LEFT BREAST LUMPECTOMY WITH BRACKETED NEEDLE LOCALIZATION AND LEFT  AXILLARY SENTINEL LYMPH NODE BX;  Surgeon: [name], MD;  Location: [facility];  Service: General;  Laterality: Left;  . BREAST SURGERY    . PORTACATH PLACEMENT Right 04/25/2015   Procedure: INSERTION PORT-A-CATH;  Surgeon: [name], MD;  Location: [facility];  Service: General;  Laterality: Right;  . RE-EXCISION OF BREAST LUMPECTOMY Left 04/05/2015   Procedure: RE-EXCISION LEFT  BREAST LUMPECTOMY;  Surgeon: [name], MD;  Location: [facility];  Service: General;  Laterality: Left;    Family History  Problem Relation Age of Onset  . Lung cancer Maternal Grandmother 70       non smoker  . Lung cancer Maternal Grandfather 9       non smoker worked in [employer]  . Cancer Paternal Grandfather 35       throat cancer ? smoker  . Cancer Cousin 41       throat cancer ? smoker   Social History:  reports that she has never smoked. She has never used smokeless tobacco. She reports that she drinks alcohol. She reports that she does not use drugs.  Allergies:  Allergies  Allergen Reactions  . Adhesive [Tape] Other (See Comments)    Skin blisters    Medications Prior to Admission  Medication Sig Dispense Refill  . Ascorbic Acid (VITAMIN C) 1000 MG tablet Take 1,000 mg by mouth daily.    Marland Kitchen ibuprofen (ADVIL,MOTRIN) 200 MG tablet Take 400 mg by mouth every 8 (eight) hours as needed for mild pain.    Marland Kitchen venlafaxine XR (EFFEXOR-XR) 37.5 MG 24 hr capsule Take 1 capsule (37.5 mg total) by mouth daily with breakfast. (Patient not taking: Reported on 04/08/2017) 90 capsule 3    No results found for this or any previous visit (from the past 48 hour(s)). No results found.  Review of Systems  Constitutional: Negative.   HENT: Negative.   Eyes: Negative.   Respiratory: Negative.   Cardiovascular: Negative.  Gastrointestinal: Negative.   Genitourinary: Negative.   Musculoskeletal: Negative.   Skin: Negative.   Neurological: Negative.   Psychiatric/Behavioral: Negative.     Blood pressure (!) 146/88, pulse (!) 101, temperature 98.7 F (37.1 C), temperature source Oral, resp. rate (!) 24, height 5\' 2"  (1.575 m), weight 96.2 kg (212 lb 3 oz), SpO2 100 %. Physical Exam  Constitutional: She appears well-developed and well-nourished.  HENT:  Head: Normocephalic and atraumatic.  Eyes: EOM are normal. Pupils are equal, round, and reactive to light.  Cardiovascular: Normal rate.    Respiratory: Effort normal. No respiratory distress.  GI: Soft. She exhibits no distension.  Musculoskeletal: Normal range of motion.  Neurological: [name] alert.  Skin: No rash noted. No erythema. No pallor.  Psychiatric: She has a normal mood and affect. Her behavior is normal. Judgment and thought content normal.     Assessment/Plan Plan immediate bilateral breast reconstruction with placement of bilateral tissue expanders and acellular dermal matrix. The risks that can be encountered with and after placement of a breast expander placement were discussed and include the following but not limited to these: bleeding, infection, delayed healing, anesthesia risks, skin sensation changes, injury to structures including nerves, blood vessels, and muscles which may be temporary or permanent, allergies to tape, suture materials and glues, blood products, topical preparations or injected agents, skin contour irregularities, skin discoloration and swelling, deep vein thrombosis, cardiac and pulmonary complications, pain, which may persist, fluid accumulation, wrinkling of the skin over the expander, changes in nipple or breast sensation, expander leakage or rupture, faulty position of the expander, persistent pain, formation of tight scar tissue around the expander (capsular contracture), possible need for revisional surgery or staged procedures. The patient's questions were answered and she desires to proceed and consent was obtained  Wallace Going, DO 04/20/2017, 2:28 PM

## 2017-04-20 NOTE — H&P (Signed)
History of Present Illness Cheryl [name]; 04/02/2017 2:38 PM) The patient is a 41 year old female who presents with breast cancer. Patient known to me status post left breast lumpectomy and negative sentinel lymph node biopsy in May 2016 for a stage IA T1 cN0 triple negative invasive and in situ cancer of the left breast. Following lumpectomy and reexcision for negative margins she had Port-A-Cath placement and underwent adjuvant chemotherapy stopped because of 2 cycles due to persistent neuropathy. She then underwent adjuvant radiation and subsequently has had her Port-A-Cath removed.  Recent screening mammogram revealed a stable lumpectomy site in the upper outer quadrant but new oval asymmetry in the slightly lower inner aspect of the left breast with indistinct margins. Ultrasound revealed a mostly circumscribed mass measuring 1 cm corresponding to the mammographic mass which was favored to represent a cyst. However ultrasound guided aspiration failed to reveal fluid and she subsequently underwent a large core needle biopsy of the mass at 7 o'clock position 5 cm from the nipple. This has unfortunately revealed invasive mammary carcinoma triple negative Ki-67 15%. He can hear and was strongly positive consistent with invasive ductal carcinoma. Biopsy was performed on March 05, 2017. She has not noted any masses in her breast. No nipple discharge or skin changes.  Since her original consultation she has seen Dr. Marla [name] and has elected bilateral total mastectomy with immediate reconstruction with tissue expanders.  She has had staging including a bone scan showing a very questionable area near the sternomanubrial joint and also a probably benign 2 cm liver lesion and MRI is scheduled.   Problem List/Past Medical Cheryl Kitchen T. [name], [name]; 04/02/2017 2:38 PM) MALIGNANT NEOPLASM OF UPPER-OUTER QUADRANT OF LEFT FEMALE BREAST (C50.412)  BREAST CANCER, LEFT (C50.912)  PRIMARY CANCER  OF LOWER INNER QUADRANT OF LEFT BREAST (C50.312)   Past Surgical History Cheryl Kitchen T. Yancey Pedley, [name]; 04/02/2017 2:38 PM) Breast Biopsy  Bilateral.  Diagnostic Studies History Cheryl Kitchen T. Alick Lecomte, [name]; 04/02/2017 2:38 PM) Colonoscopy  never Mammogram  within last year Pap Smear  1-5 years ago  Allergies Malachy Moan, RMA; 04/02/2017 2:15 PM) Adhesive Tape  blisters  Medication History Malachy Moan, RMA; 04/02/2017 2:15 PM) Ibuprofen (400MG Tablet, Oral as needed) Active. Medications Reconciled  Social History Cheryl Kitchen T. Maniya Donovan, [name]; 04/02/2017 2:38 PM) Alcohol use  Occasional alcohol use. Caffeine use  Carbonated beverages, Coffee, Tea. No drug use  Tobacco use  Never smoker.  Family History Cheryl Kitchen T. Jeferson Boozer, [name]; 04/02/2017 2:38 PM) Arthritis  Father. Bleeding disorder  Daughter. Cancer  Family Members In General. Cerebrovascular Accident  Family Members In General. Diabetes Mellitus  Family Members In General, Mother. Heart disease in female family member before age 67  Hypertension  Family Members In General, Mother. Migraine Headache  Mother. Respiratory Condition  Family Members In General. Seizure disorder  Family Members In General.  Pregnancy / Birth History Cheryl Kitchen T. Marri Mcneff, [name]; 04/02/2017 2:38 PM) Age at menarche  89 years. Contraceptive History  Contraceptive implant, Intrauterine device, Oral contraceptives. Gravida  4 Maternal age  63-20 Para  3 Regular periods   Other Problems Cheryl Kitchen T. Shamel Galyean, [name]; 04/02/2017 2:38 PM) Breast Cancer  Gastroesophageal Reflux Disease  Lump In Breast   Vitals Malachy Moan RMA; 04/02/2017 2:16 PM) 04/02/2017 2:15 PM Weight: 214.4 lb Height: 61in Body Surface Area: 1.95 m Body Mass Index: 40.51 kg/m  Temp.: 98.31F  Pulse: 113 (Regular)  BP: 136/80 (Sitting, Left Arm, Standard)       Physical Exam Cheryl Kitchen T.  Waunita Sandstrom [name]; 04/02/2017 2:39 PM) The  physical exam findings are as follows: Note:General: Alert, moderately overweight Caucasian female, in no distress Skin: Warm and dry without rash or infection. HEENT: No palpable masses or thyromegaly. Sclera nonicteric. Pupils equal round and reactive. Lymph nodes: No cervical, supraclavicular, or inguinal nodes palpable. Breasts: Healed lumpectomy site lateral left breast. I cannot feel any masses in either breast. No skin changes or nipple crusting or inversion. Lungs: Breath sounds clear and equal. No wheezing or increased work of breathing. Cardiovascular: Regular rate and rhythm without murmer. No JVD or edema. Peripheral pulses intact. No carotid bruits. Extremities: No edema or joint swelling or deformity. No chronic venous stasis changes. Neurologic: Alert and fully oriented. Gait normal. No focal weakness. Psychiatric: Normal mood and affect. Thought content appropriate with normal judgement and insight    Assessment & Plan Cheryl Kitchen T. Jaqlyn Gruenhagen [name]; 04/02/2017 2:43 PM) PRIMARY CANCER OF LOWER INNER QUADRANT OF LEFT BREAST (C50.312) Impression: New diagnosis of a probably 1 cm triple negative grade 2 invasive ductal carcinoma lower inner left breast. From the location this would appear to be a second primary in her left breast. History of triple negative cancer upper outer left breast 2 years ago treated with lumpectomy and negative sentinel lymph node biopsy, adjuvant chemotherapy stopped due to toxicity and radiation therapy. We discussed that because of her previous therapy she will require left mastectomy and she was aware of this. I think she would be a good candidate for immediate reconstruction and she is interested and we will make a plastic surgery referral. She has seen Dr. Marla [name] and desires bilateral mastectomy and immediate reconstruction is planned. She will need Port-A-Cath placement and this will be done under the same anesthesia. We reviewed the surgery in detail again  today including its nature and expected recovery as well as risks of anesthetic complications, bleeding, infection and wound healing problems, and specific port complications including pneumothorax, DVT, malfunction or displacement and infection. All her questions were answered. Current Plans Schedule for Surgery  Bilateral total mastectomy with left axillary sentinel lymph node biopsy and placement of Port-A-Cath to be followed by immediate reconstruction

## 2017-04-20 NOTE — Anesthesia Procedure Notes (Signed)
Procedure Name: Intubation Date/Time: 04/20/2017 12:45 PM Performed by: Neldon Newport Pre-anesthesia Checklist: Timeout performed, Patient being monitored, Patient identified, Suction available and Emergency Drugs available Patient Re-evaluated:Patient Re-evaluated prior to inductionOxygen Delivery Method: Circle system utilized Preoxygenation: Pre-oxygenation with 100% oxygen Intubation Type: IV induction Ventilation: Mask ventilation without difficulty and Oral airway inserted - appropriate to patient size Laryngoscope Size: Mac and 3 Grade View: Grade I Tube type: Oral Tube size: 7.0 mm Number of attempts: 1 Placement Confirmation: breath sounds checked- equal and bilateral,  positive ETCO2 and ETT inserted through vocal cords under direct vision Secured at: 22 cm Tube secured with: Tape Dental Injury: Teeth and Oropharynx as per pre-operative assessment

## 2017-04-20 NOTE — Op Note (Addendum)
Op report    DATE OF OPERATION:  04/20/2017  LOCATION: [facility] Operating Room  SURGICAL DIVISION: Plastic Surgery  PREOPERATIVE DIAGNOSES:  1. Right Breast cancer.    POSTOPERATIVE DIAGNOSES:  1.Right Breast cancer.   PROCEDURE:  1. Bilateral immediate breast reconstruction with placement of Acellular Dermal Matrix and tissue expanders.  SURGEON: Claire Sanger Dillingham, DO  ANESTHESIA:  General.   COMPLICATIONS: None.   IMPLANTS: Left - Mentor Artoura 300cc. Ref #[id number].  Serial Number [id number] 200 cc of injectable saline placed in the expander Right - Mentor Artoura 300cc. Ref # [id number].  Serial Number [id number] 200 cc of injectable saline placed in the expander Acellular Dermal Matrix: FlexHD 4 x 16 cm bilaterally  INDICATIONS FOR PROCEDURE:  The patient, Cheryl [name], is a 41 y.o. female born on [birth year], is here for  immediate first stage breast reconstruction with placement of bilateral tissue expander and Acellular dermal matrix. MRN: [id number]  CONSENT:  Informed consent was obtained directly from the patient. Risks, benefits and alternatives were fully discussed. Specific risks including but not limited to bleeding, infection, hematoma, seroma, scarring, pain, implant infection, implant extrusion, capsular contracture, asymmetry, wound healing problems, and need for further surgery were all discussed. The patient did have an ample opportunity to have her questions answered to her satisfaction.   DESCRIPTION OF PROCEDURE:  The patient was taken to the operating room by the general surgery team. SCDs were placed and IV antibiotics were given. The patient's chest was prepped and draped in a sterile fashion. A time out was performed and the implants to be used were identified.  Bilateral mastectomies were performed.  Once the general surgery team had completed their portion of the case the patient was rendered to the plastic and reconstructive surgery  team.  Right:  The pectoralis major muscle was lifted from the chest wall with release of the lateral edge and lateral inframammary fold.  The pocket was irrigated with antibiotic solution and hemostasis was achieved with electrocautery.  The ADM was then prepared according to the manufacture guidelines and slits placed to help with postoperative fluid management.  The ADM was then sutured to the inferior and lateral edge of the inframammary fold with 2-0 PDS starting with an interrupted stitch and then a running stitch.  The lateral portion was sutured to with interrupted sutures after the expander was placed.  The expander was prepared according to the manufacture guidelines, the air evacuated and then it was placed under the ADM and pectoralis major muscle.  The inferior and lateral tabs were used to secure the expander to the chest wall with 2-0 PDS.  The drain was placed at the inframammary fold over the ADM and secured to the skin with 3-0 Silk.  he deep layers were closed with 3-0 Monocryl followed by 4-0 Monocryl.  The skin was closed with 5-0 Monocryl and then dermabond was applied.  Left:  The pectoralis major muscle was lifted from the chest wall with release of the lateral edge and lateral inframammary fold.  The pocket was irrigated with antibiotic solution and hemostasis was achieved with electrocautery.  The ADM was then prepared according to the manufacture guidelines and slits placed to help with postoperative fluid management.  The ADM was then sutured to the inferior and lateral edge of the inframammary fold with 2-0 PDS starting with an interrupted stitch and then a running stitch.  The lateral portion was sutured to with interrupted sutures after the expander was placed.  The expander was prepared according to the manufacture guidelines, the air evacuated and then it was placed under the ADM and pectoralis major muscle.  The inferior and lateral tabs were used to secure the expander to the  chest wall with 2-0 PDS.  The drain was placed at the inframammary fold over the ADM and secured to the skin with 3-0 Silk.  The deep layers were closed with 3-0 Monocryl followed by 4-0 Monocryl.  The skin was closed with 5-0 Monocryl and then dermabond was applied.  The ABDs and breast binder were placed.  The patient tolerated the procedure well and there were no complications.  The patient was allowed to wake from anesthesia and taken to the recovery room in satisfactory condition.

## 2017-04-20 NOTE — Anesthesia Postprocedure Evaluation (Signed)
Anesthesia Post Note  Patient: Cheryl [name]  Procedure(s) Performed: Procedure(s) (LRB): BILATERAL TOTAL MASTECTOMY WITH LEFT AXILLARY SENTINEL LYMPH NODE BIOPSY (Bilateral) INSERTION PORT-A-CATH (N/A) BILATARAL BREAST RECONSTRUCTION WITH PLACEMENT OF TISSUE EXPANDER AND FLEX HD (ACELLULAR HYDRATED DERMIS) (Bilateral)  Patient location during evaluation: PACU Anesthesia Type: General Level of consciousness: awake Pain management: pain level controlled Vital Signs Assessment: post-procedure vital signs reviewed and stable Respiratory status: spontaneous breathing Cardiovascular status: stable Anesthetic complications: no       Last Vitals:  Vitals:   04/20/17 1221 04/20/17 1748  BP:    Pulse: (!) 101   Resp: (!) 24   Temp:  36.9 C    Last Pain:  Vitals:   04/20/17 1748  TempSrc:   PainSc: 8                  Gean Larose

## 2017-04-20 NOTE — Addendum Note (Signed)
Addendum  created 04/20/17 1826 by Belinda Block, MD   Anesthesia Intra Blocks edited, Child order released for a procedure order, Sign clinical note

## 2017-04-20 NOTE — Anesthesia Preprocedure Evaluation (Addendum)
Anesthesia Evaluation  Patient identified by MRN, date of birth, ID band Patient awake    Reviewed: Allergy & Precautions, NPO status , Patient's Chart, lab work & pertinent test results  Airway Mallampati: II  TM Distance: >3 FB     Dental  (+) Teeth Intact, Dental Advidsory Given   Pulmonary    breath sounds clear to auscultation       Cardiovascular negative cardio ROS   Rhythm:Regular Rate:Normal     Neuro/Psych  Neuromuscular disease    GI/Hepatic negative GI ROS, Neg liver ROS,   Endo/Other  negative endocrine ROS  Renal/GU negative Renal ROS     Musculoskeletal   Abdominal   Peds  Hematology   Anesthesia Other Findings   Reproductive/Obstetrics                            Anesthesia Physical Anesthesia Plan  ASA: III  Anesthesia Plan: General   Post-op Pain Management:    Induction: Intravenous  Airway Management Planned: Oral ETT  Additional Equipment:   Intra-op Plan:   Post-operative Plan: Extubation in OR  Informed Consent: I have reviewed the patients History and Physical, chart, labs and discussed the procedure including the risks, benefits and alternatives for the proposed anesthesia with the patient or authorized representative who has indicated his/her understanding and acceptance.   Dental advisory given and Dental Advisory Given  Plan Discussed with: CRNA, Anesthesiologist and Surgeon  Anesthesia Plan Comments:       Anesthesia Quick Evaluation

## 2017-04-20 NOTE — Op Note (Signed)
Preoperative Diagnosis: Cancer left breast  Postoprative Diagnosis: Cancer left breast  Procedure: Procedure(s): Blue dye injection left breast BILATERAL TOTAL MASTECTOMY WITH LEFT AXILLARY DISSECTION (modified mastectomy)  INSERTION PORT-A-CATH    Surgeon: [name] T   Assistants: Jackolyn Confer  Anesthesia:  General endotracheal anesthesia  Indications: Patient is a 41 year old female with a recent diagnosis of a 1 cm triple negative invasive ductal carcinoma of the left breast with a previous history of triple negative carcinoma of the left breast treated just over one year ago with lumpectomy, radiation and chemotherapy. After extensive preoperative workup and discussion details elsewhere she has elected to proceed with bilateral total mastectomy and left axillary sentinel lymph node biopsy and will require Port-A-Cath placement for chemotherapy. The procedures have been discussed in detail including indications, alternatives and risks and she understands and agrees to proceed.    Procedure Detail:  In the holding area the patient had undergone injection of 1 mCi of technetium sulfur colloid intradermally around the left nipple and also had undergone bilateral pectoral block. [name] brought to the operating room, placed in the supine position on the operating table, and general endotracheal anesthesia induced. She received preoperative IV antibiotics. PAS were placed. The neoprobe in the left axilla I was really unable to obtain any significant counts and therefore under sterile technique after patient timeout I injected 5 mL of dilute methylene blue beneath the left nipple and massaged this for several minutes. The entire anterior chest and breasts, axillae and neck were widely sterilely prepped and draped. Patient timeout was performed and correct procedure verified. Initially the total mastectomy on the right was performed. I used a skin sparing transverse elliptical incision  excising the nipple areolar complex. Skin and subcutaneous flaps were then raised superiorly toward the clavicle, medially to the sternum, inferiorly to the inframammary crease and laterally out toward the anterior border of the latissimus dorsi which was defined. Dissection was deepened down to the chest wall circumferentially. The breast was then reflected up off of the chest wall. All dissection was done with the plasma blade. The specimen was reflected medial to lateral dissecting off of the pectoralis major and serratus and laterally off of the anterior border of the latissimus working up toward the axilla. The low axilla was exposed and I came across the low axilla with Kelly clamps and tied with 3-0 Vicryl. Complete hemostasis was obtained with cautery and several figure-of-eight sutures of 3-0 Vicryl. The flaps were uniform and appeared healthy. This wound was packed with moist saline gauze and attention turned to the left mastectomy. An identical incision was made and skin and subcutaneous flaps were developed in an identical fashion. The previous lumpectomy incision was well lateral on the left breast and was left.  The breast was reflected up off the chest wall working medial to lateral as previously. There was some scarring posteriorly to the old lumpectomy site and this was taken down right to the chest wall. As dissection progressed up to the axilla the clavipectoral fascia was exposed and the pectoralis minor retracted medially and wide exposure obtained of the axilla. Extensive use of the neoprobe throughout the axilla failed to reveal any significant counts whatsoever. There was no palpable adenopathy. I carefully explored the axilla for any evidence of blue dye but did not see any. With this finding I felt the best course was to proceed with axillary dissection (modified mastectomy. The axillary vein was identified and carefully protected. Beginning just under the pectoralis minor  fibrofatty tissue  was swept inferiorly and laterally away from the axillary vein. Perforating vessels for the axillary artery and vein were divided with clips. As the dissection progressed laterally the thoracodorsal nerve and vessels were identified and protected. The long thoracic nerve was protected. The fibrofatty tissue between these was swept inferiorly and final attachments along the serratus and latissimus were divided. As I examined the axillary contents with the neoprobe again I did not find any counts. I did however after dissecting the axilla following one noted the axillary contents with a small but definite amount of blue dye and this was separated and sent separately as a blue left axillary lymph node. Again hemostasis was completely obtained. The wound was packed. Following this the patient was placed in Trendelenburg position. Using the ultrasound the right internal jugular vein was clearly identified. Using continuous ultrasound visualization the vein was cannulated with a needle and guidewire. A small stab incision was made at the guidewire site. The introducer was passed over the guidewire and the flush catheter placed via the introducer which was stripped away and the tip of the catheter positioned in the superior vena cava. The body habitus was significant for breast tissue extending to just up under the clavicle and the superior skin flaps had been dissected up near the clavicle and there was really no place to put the port except under the superior skin flap of the mastectomy. This was discussed with Dr. Marla Roe.  Catheter was tunneled subcutaneously into the mastectomy wound site and then trimmed to length and attached the port which was sutured with 3 sutures to the anterior chest wall at the very top of the superior skin flap dissection which was very close to her previous port incision. Port was flushed and aspirated easily and was left flushed with concentrated heparin solution. At this point Dr.  Marla Roe continued with reconstruction as planned.    Findings: As above  Estimated Blood Loss:  less than 100 mL         Drains: Per Dr. Marla Roe  Blood Given: none          Specimens: #1 right total mastectomy #2 left modified mastectomy #3 blue left axillary lymph node        Complications:  * No complications entered in OR log *         Disposition: PACU - hemodynamically stable.         Condition: stable

## 2017-04-20 NOTE — Transfer of Care (Signed)
Immediate Anesthesia Transfer of Care Note  Patient: Cheryl [name]  Procedure(s) Performed: Procedure(s): BILATERAL TOTAL MASTECTOMY WITH LEFT AXILLARY SENTINEL LYMPH NODE BIOPSY (Bilateral) INSERTION PORT-A-CATH (N/A) BILATARAL BREAST RECONSTRUCTION WITH PLACEMENT OF TISSUE EXPANDER AND FLEX HD (ACELLULAR HYDRATED DERMIS) (Bilateral)  Patient Location: PACU  Anesthesia Type:GA combined with regional for post-op pain  Level of Consciousness: awake, alert  and patient cooperative  Airway & Oxygen Therapy: Patient Spontanous Breathing  Post-op Assessment: Report given to RN and Post -op Vital signs reviewed and stable  Post vital signs: Reviewed and stable  Last Vitals:  Vitals:   04/20/17 1221 04/20/17 1748  BP:    Pulse: (!) 101   Resp: (!) 24   Temp:  36.9 C    Last Pain:  Vitals:   04/20/17 1011  TempSrc: Oral         Complications: No apparent anesthesia complications

## 2017-04-21 ENCOUNTER — Encounter (HOSPITAL_COMMUNITY): Payer: Self-pay

## 2017-04-21 DIAGNOSIS — Z9011 Acquired absence of right breast and nipple: Secondary | ICD-10-CM | POA: Diagnosis not present

## 2017-04-21 DIAGNOSIS — C50912 Malignant neoplasm of unspecified site of left female breast: Secondary | ICD-10-CM | POA: Diagnosis not present

## 2017-04-21 DIAGNOSIS — C50312 Malignant neoplasm of lower-inner quadrant of left female breast: Secondary | ICD-10-CM | POA: Diagnosis not present

## 2017-04-21 LAB — BASIC METABOLIC PANEL
ANION GAP: 7 (ref 5–15)
BUN: 5 mg/dL — ABNORMAL LOW (ref 6–20)
CO2: 26 mmol/L (ref 22–32)
Calcium: 8.3 mg/dL — ABNORMAL LOW (ref 8.9–10.3)
Chloride: 105 mmol/L (ref 101–111)
Creatinine, Ser: 0.76 mg/dL (ref 0.44–1.00)
GFR calc non Af Amer: >60 mL/min (ref >60)
Glucose, Bld: 154 mg/dL — ABNORMAL HIGH (ref 65–99)
Potassium: 3.8 mmol/L (ref 3.5–5.1)
SODIUM: 138 mmol/L (ref 135–145)

## 2017-04-21 NOTE — Discharge Instructions (Signed)
Sink bath Empty both bulb drains often and make sure to compress or "charge" them and record the amount for your follow up visit No Heavy lifting Continue binder or sports bra.  Dressing Care: You may remove your breast padding tomorrow, and you do NOT need to replace the dressing. The steri strips on the right side of your neck will fall off on their own.

## 2017-04-21 NOTE — Discharge Summary (Signed)
Physician Discharge Summary  Patient ID: Cheryl [name] MRN: [id number] DOB/AGE: 1976/04/29 41 y.o.  Admit date: 04/20/2017 Discharge date: 04/21/2017  Admission Diagnoses:  Discharge Diagnoses:  Active Problems:   Breast cancer [facility])   Discharged Condition: good  Hospital Course: The patient underwent bilateral mastectomies with immediate reconstruction.  She was cared for on the surgical unit postoperatively.  She was trained on drain care.  She was eating and walking without difficulty.  Her pain was well controlled.  Consults: None  Significant Diagnostic Studies: none  Treatments: surgery: bilateral mastectomies with reconstruction  Discharge Exam: Blood pressure 129/73, pulse 79, temperature 98.6 F (37 C), resp. rate 18, height 5\' 2"  (1.575 m), weight 96.2 kg (212 lb 1.3 oz), SpO2 97 %. General appearance: alert, cooperative and no distress Breasts: normal appearance, no masses or tenderness Incision/Wound:  Disposition: 01-Home or Self Care   Allergies as of 04/21/2017      Reactions   Adhesive [tape] Other (See Comments)   Skin blisters      Medication List    TAKE these medications   ibuprofen 200 MG tablet Commonly known as:  ADVIL,MOTRIN Take 400 mg by mouth every 8 (eight) hours as needed for mild pain.   venlafaxine XR 37.5 MG 24 hr capsule Commonly known as:  EFFEXOR-XR Take 1 capsule (37.5 mg total) by mouth daily with breakfast.   vitamin C 1000 MG tablet Take 1,000 mg by mouth daily.      Follow-up Information    [name], MD In 2 weeks.   Specialty:  General Surgery Contact information: [address] [place] [address] [phone]        Wallace Going, DO In 1 week.   Specialty:  Plastic Surgery Contact information: [place] [address] [phone]           Signed: Wallace Going 04/21/2017, 1:51 PM

## 2017-04-21 NOTE — Progress Notes (Signed)
Patient ID: Cheryl [name], female   DOB: 1976-03-04, 41 y.o.   MRN: [id number] 1 Day Post-Op   Subjective: Doing well this morning. Has been up walking. Getting to the bathroom without difficulty. Good pain control. No other complaints.  Objective: Vital signs in last 24 hours: Temp:  [98.5 F (36.9 C)-98.8 F (37.1 C)] 98.6 F (37 C) (05/30 0657) Pulse Rate:  [79-108] 79 (05/30 0657) Resp:  [14-24] 18 (05/30 0657) BP: (113-153)/(67-92) 129/73 (05/30 0657) SpO2:  [90 %-100 %] 97 % (05/30 0657) Weight:  [96.2 kg (212 lb 1.3 oz)] 96.2 kg (212 lb 1.3 oz) (05/29 2009) Last BM Date: 04/20/17  Intake/Output from previous day: 05/29 0701 - 05/30 0700 In: 4737.1 [P.O.:360; I.V.:4177.1; IV Piggyback:200] Out: 1930 [Urine:1310; Drains:370; Blood:250] Intake/Output this shift: Total I/O In: 240 [P.O.:240] Out: 85 [Drains:85]  General appearance: alert, cooperative and no distress Incision/Wound: Incisions clean and dry. JP drainage appropriate and no evidence of bleeding. Slightly more ecchymosis and darkening of the skin on the left as would be expected postradiation but all appears viable.  Lab Results:  No results for input(s): WBC, HGB, HCT, PLT in the last 72 hours. BMET  Recent Labs  04/21/17 0419  NA 138  K 3.8  CL 105  CO2 26  GLUCOSE 154*  BUN 5*  CREATININE 0.76  CALCIUM 8.3*     Studies/Results: Dg Chest Port 1 View  Result Date: 04/20/2017 CLINICAL DATA:  Port-A-Cath placement. EXAM: PORTABLE CHEST 1 VIEW COMPARISON:  06/27/2015 FINDINGS: Power port placed on the right, apparently entering the internal jugular vein in the neck with the tip in the SVC at the azygos level. No pneumothorax or hemothorax. No active cardiopulmonary process. IMPRESSION: Port-A-Cath on the right with the tip in the SVC at the azygos level. Electronically Signed   By: Nelson Cheryl [name] M.D.   On: 04/20/2017 18:22   Dg Fluoro Guide Cv Line-no Report  Result Date: 04/20/2017 Fluoroscopy was  utilized by the requesting physician.  No radiographic interpretation.    Anti-infectives: Anti-infectives    Start     Dose/Rate Route Frequency Ordered Stop   04/20/17 2200  ceFAZolin (ANCEF) IVPB 2g/100 mL premix     2 g 200 mL/hr over 30 Minutes Intravenous Every 8 hours 04/20/17 2001     04/20/17 1654  polymyxin 500,000 units/gentamicin 80 mg/0.9 % sodium chloride ophth. irrigation  Status:  Discontinued       As needed 04/20/17 1655 04/20/17 1805   04/20/17 1053  ceFAZolin (ANCEF) IVPB 2g/100 mL premix  Status:  Discontinued     2 g 200 mL/hr over 30 Minutes Intravenous On call to O.R. 04/20/17 1053 04/20/17 1055   04/20/17 1052  ceFAZolin (ANCEF) IVPB 2g/100 mL premix     2 g 200 mL/hr over 30 Minutes Intravenous On call to O.R. 04/20/17 1053 04/20/17 1655      Assessment/Plan: s/p Procedure(s): BILATERAL TOTAL MASTECTOMY WITH LEFT AXILLARY SENTINEL LYMPH NODE BIOPSY INSERTION PORT-A-CATH BILATARAL BREAST RECONSTRUCTION WITH PLACEMENT OF TISSUE EXPANDER AND FLEX HD (ACELLULAR HYDRATED DERMIS) Doing well without apparent complication. I discussed with the patient operative findings and decision to perform a left axillary dissection. All questions answered. Discharge planning per Dr. Marla Roe but I expect she is ready for discharge today.   LOS: 1 day    [name] T 04/21/2017

## 2017-04-21 NOTE — Care Management Note (Signed)
Case Management Note  Patient Details  Name: Cheryl [name] MRN: [id number] Date of Birth: 18-Feb-1976  Subjective/Objective:                    Action/Plan: No discharge needs identified , will continue to follow Expected Discharge Date:                  Expected Discharge Plan:  Home/Self Care  In-House Referral:     Discharge planning Services     Post Acute Care Choice:    Choice offered to:     DME Arranged:    DME Agency:     HH Arranged:    [place] Agency:     Status of Service:     If discussed at [employer] of Stay Meetings, dates discussed:    Additional Comments:  Marilu Favre, RN 04/21/2017, 11:54 AM

## 2017-04-21 NOTE — Progress Notes (Signed)
RN called Dr Marla Roe to clarify dressing change orders for patient's discharge instructions. Dr Marla Roe stated that patient could remove breast packing tomorrow and that she did not have to replace the dressing afterwards. RN provided patient with copy of AVS and added next time for medications onto AVS. Patient and spouse verbalized and demonstrated understanding of drain care. Patient's spouse emptied patient's JP drain prior to patient's discharge, RN witnessed this and it was satisfactory and did not require reinforcement of education. Patient provided with sterile cups to empty JP drains at home. Patient and patient's spouse verbalized undersanding of thorough educated provided by RN. Iv removed. Patient taken downstairs in a wheelchair.

## 2017-04-22 ENCOUNTER — Telehealth: Payer: Self-pay | Admitting: Oncology

## 2017-04-22 NOTE — Telephone Encounter (Signed)
Patient called to cancel her appointment on 6/1.  She just has surgery and does not fell like coming out tomorrow the next appointment was 5/25 if she needs to be seen before then let me know

## 2017-04-22 NOTE — Addendum Note (Signed)
Addendum  created 04/22/17 0859 by Belinda Block, MD   Anesthesia Intra Blocks edited, Sign clinical note

## 2017-04-22 NOTE — Telephone Encounter (Signed)
I will look into it and call pt. Thank you.

## 2017-04-22 NOTE — Telephone Encounter (Signed)
Called pt to reschedule pt next week with Dr.Magrinat tues 04/27/17 at 1230pm. Pt verbally confirmed time/date.

## 2017-04-23 ENCOUNTER — Encounter (HOSPITAL_COMMUNITY): Payer: Self-pay | Admitting: General Surgery

## 2017-04-23 ENCOUNTER — Ambulatory Visit: Payer: Federal, State, Local not specified - PPO | Admitting: Oncology

## 2017-04-23 NOTE — OR Nursing (Signed)
Late entry due to delay code entry.

## 2017-04-27 ENCOUNTER — Ambulatory Visit: Payer: Federal, State, Local not specified - PPO | Admitting: Oncology

## 2017-04-27 DIAGNOSIS — Z9013 Acquired absence of bilateral breasts and nipples: Secondary | ICD-10-CM | POA: Insufficient documentation

## 2017-05-06 NOTE — Progress Notes (Signed)
[place]  [phone] [phone]     ID: Cheryl [name] DOB: 04-08-1976  MR#: [id number]  XEN#:[id number]  Patient Care Team: [facility], [facility] as PCP - General (Family Medicine) [name], MD as Consulting Physician (Obstetrics and Gynecology) [name], MD as Consulting Physician (General Surgery) Yaniah Thiemann, Virgie Dad, MD as Consulting Physician (Oncology) [name], RN as Registered Nurse [name], RN as Registered Nurse Dillingham, [name], DO as Attending Physician (Plastic Surgery) PCP: [facility], [facility] OTHER MD:  CHIEF COMPLAINT: Recurrent breast cancer  CURRENT TREATMENT: Adjuvant chemotherapy   INTERVAL HISTORY:  Cheryl [name] returns today for follow-up and treatment of her recurrent triple negative breast cancer. Since her last visit here she was staged with a CT scan of the chest 03/25/2017 showing no evidence of metastatic disease (there is some nonspecific pulmonary nodules all less than 0.5 cm and a likely benign left hepatic lesion), with a bone scan the same day showing as punctate focus of activity along the left sternum. There was no sclerosis or destructive changes in that area on same day CT scan.   She then underwent bilateral mastectomies with left axillary lymph node dissection on 04/20/2017. The final pathology showed, on the right, no evidence of disease. On the left there was a 2.5 cm grade 3 residual invasive ductal carcinoma, with negative margins and 0 of 8 lymph nodes involved.  She had a port placed at the same time to facilitate therapy.  REVIEW OF SYSTEMS: Cheryl [name] did "good" with the surgery. She was overnight in the hospital. She did not have significant pain but she is uncomfortable today because she just had the expander "failed". She also still has drains in place bilaterally. She hopes to get those out early next week, in time to start chemotherapy late  next week. Aside from issues relating to the surgery, she is doing well and she is hoping to be able to get back to work sometime in July. [name] taking oxycodone at bedtime and this is constipating her some. She uses MiraLAX to deal with that.    HISTORY OF RECURRENT DISEASE: From my 03/18/2017 note:  Cheryl [name] that he'll returns today for follow-up of her triple negative breast cancer, which is now recurrent. She had routine bilateral diagnostic mammography at the [facility] 03/10/2017 and this showed the breast density to be category C. The old lumpectomy site, which was in the upper outer quadrant, appears stable. However there was a new area of asymmetry in the lower inner quadrant of the left breast. Ultrasound confirmed this to be a 1.0 cm mass at the 7:00 radiant 5 cm from the nipple. Aspiration was attempted but no fluid was aspirated and biopsy of this mass was obtained 03/05/2017. This showed ([id number]) invasive mammary carcinoma, grade 2, estrogen and progesterone receptor negative, HER-2 not amplified with a signals ratio 1.17 and a number per cell of 2.45, with an MIB-1 of 15%.  [name] here today to discuss subsequent management   BREAST CANCER HISTORY: From the original intake note:  The patient had screening mammography 02/01/2015 which showed a calcifications in the upper outer quadrant of the left breast. On 02/08/2015 she underwent left diagnostic mammography with tomosynthesis and left breast ultrasonography at the breast Center. The breast density was category C. Mammography confirmed a group of pleomorphic calcifications spanning 4.2 cm maximally. Physical examination of the upper outer quadrant of the left breast showed an area of thickening at  the approximate 2:00 position. Ultrasound of this area showed no discrete masses. There was vague shadowing present. Calcifications could not be clearly identified sonographically. There was no lymphadenopathy in the left  axilla.  On the same day the patient underwent biopsy of the left breast area in question, with the pathology (S [id number]) showing ductal carcinoma in situ, grade 2 or 3, with possible areas of microinvasion, the cancer cells being strongly estrogen and progesterone receptor positive, both at 100%.  On 02/15/2015 the patient underwent bilateral breast MRI. This showed the area of malignancy in the upper outer quadrant to measure 4.5 cm, including a large area of non-masslike enhancement and a 1 cm spiculated mass. There was also an indeterminate oval mass in the central left breast and another indeterminant mass in the slightly outer right breast anteriorly. Biopsy of the right breast mass 02/18/2015 ([id number]) showed a fibroadenoma, with no evidence of malignancy. Biopsy of 2 additional areas of the left breast (at 10:00 and 2:00) showed a fibroadenoma in the upper inner quadrant, and pseudo-angiomatous stromal hyperplasia (PA SH) at the 2:00 area of the left breast.  The patient's subsequent history is as detailed below.   PAST MEDICAL HISTORY: Past Medical History:  Diagnosis Date  . Breast cancer ([place])    left breast  . Wears contact lenses     PAST SURGICAL HISTORY: Past Surgical History:  Procedure Laterality Date  . BILATERAL TOTAL MASTECTOMY WITH AXILLARY LYMPH NODE DISSECTION  04/20/2017  . BREAST BIOPSY Left 02/18/2015  . BREAST BIOPSY Right 02/18/2015  . BREAST EXCISIONAL BIOPSY Left 04/05/2015  . BREAST LUMPECTOMY Left 03/27/2015  . BREAST LUMPECTOMY WITH NEEDLE LOCALIZATION AND AXILLARY SENTINEL LYMPH NODE BX Left 03/27/2015   Procedure: LEFT BREAST LUMPECTOMY WITH BRACKETED NEEDLE LOCALIZATION AND LEFT  AXILLARY SENTINEL LYMPH NODE BX;  Surgeon: [name], MD;  Location: [place];  Service: General;  Laterality: Left;  . BREAST RECONSTRUCTION WITH PLACEMENT OF TISSUE EXPANDER AND FLEX HD (ACELLULAR HYDRATED DERMIS) Bilateral 04/20/2017    Procedure: BILATARAL BREAST RECONSTRUCTION WITH PLACEMENT OF TISSUE EXPANDER AND FLEX HD (ACELLULAR HYDRATED DERMIS);  Surgeon: Wallace Going, DO;  Location: [place];  Service: Plastics;  Laterality: Bilateral;  . BREAST SURGERY    . MASTECTOMY W/ SENTINEL NODE BIOPSY Bilateral 04/20/2017   Procedure: BILATERAL TOTAL MASTECTOMY WITH LEFT AXILLARY SENTINEL LYMPH NODE BIOPSY;  Surgeon: [name], MD;  Location: [place];  Service: General;  Laterality: Bilateral;  . PORTACATH PLACEMENT Right 04/25/2015   Procedure: INSERTION PORT-A-CATH;  Surgeon: [name], MD;  Location: [place];  Service: General;  Laterality: Right;  . PORTACATH PLACEMENT Right 04/20/2017   Procedure: INSERTION PORT-A-CATH;  Surgeon: [name], MD;  Location: [place];  Service: General;  Laterality: Right;  . RE-EXCISION OF BREAST LUMPECTOMY Left 04/05/2015   Procedure: RE-EXCISION LEFT  BREAST LUMPECTOMY;  Surgeon: [name], MD;  Location: [place];  Service: General;  Laterality: Left;    FAMILY HISTORY Family History  Problem Relation Age of Onset  . Lung cancer Maternal Grandmother 36       non smoker  . Lung cancer Maternal Grandfather 1       non smoker worked in [employer]  . Cancer Paternal Grandfather 35       throat cancer ? smoker  . Cancer Cousin 41       throat cancer ? smoker   the patient's parents are living, in their mid-50s as  of March 2016. The patient had no siblings. Both the patient's mother's parents died from lung cancer although they did not smoke. [place] and her maternal grandfather was a Ecologist. There is no history of breast or ovarian cancer in the family to her knowledge  GYNECOLOGIC HISTORY:  No LMP recorded. Patient has had an implant. Menarche age 36, first live birth age 53. The patient is GX P3. [name] having regular periods. She uses the Essure device for contraception.  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 Her husband [name] is a Freight forwarder. Their daughter [name] lives in [place] where she works in Press photographer. Daughter Summer is a Research scientist (physical sciences) and lives with the patient.. Daughter [name] also at home is 69 years old.    ADVANCED DIRECTIVES: Not in place   HEALTH MAINTENANCE: Social History  Substance Use Topics  . Smoking status: Never Smoker  . Smokeless tobacco: Never Used  . Alcohol use 0.0 oz/week     Comment: occ     Colonoscopy:  PAP:  Bone density:  Lipid panel:  Allergies  Allergen Reactions  . Adhesive [Tape] Other (See Comments)    Skin blisters    Current Outpatient Prescriptions  Medication Sig Dispense Refill  . Ascorbic Acid (VITAMIN C) 1000 MG tablet Take 1,000 mg by mouth daily.    Marland Kitchen ibuprofen (ADVIL,MOTRIN) 200 MG tablet Take 400 mg by mouth every 8 (eight) hours as needed for mild pain.    Marland Kitchen venlafaxine XR (EFFEXOR-XR) 37.5 MG 24 hr capsule Take 1 capsule (37.5 mg total) by mouth daily with breakfast. (Patient not taking: Reported on 04/08/2017) 90 capsule 3   No current facility-administered medications for this visit.     OBJECTIVE:  Young-appearing white woman Wearing a chest band  Vitals:   05/07/17 1523  BP: 117/61  Pulse: 97  Resp: 20  Temp: 98.8 F (37.1 C)     Body mass index is 38.7 kg/m.    ECOG FS:1 - Symptomatic but completely ambulatory  Sclerae unicteric, pupils round and equal Oropharynx clear and moist No cervical or supraclavicular adenopathy Lungs no rales or rhonchi Heart regular rate and rhythm Abd soft, nontender, positive bowel sounds MSK no focal spinal tenderness, no upper extremity lymphedema Neuro: nonfocal, well oriented, appropriate affect Breasts: Deferred  LAB RESULTS:  CMP     Component Value Date/Time   NA 138 04/21/2017 0419   NA 142 03/18/2017 1433   K 3.8 04/21/2017 0419   K 4.4 03/18/2017 1433   CL 105 04/21/2017 0419   CO2 26 04/21/2017 0419    CO2 28 03/18/2017 1433   GLUCOSE 154 (H) 04/21/2017 0419   GLUCOSE 121 03/18/2017 1433   BUN 5 (L) 04/21/2017 0419   BUN 12.8 03/18/2017 1433   CREATININE 0.76 04/21/2017 0419   CREATININE 0.8 03/18/2017 1433   CALCIUM 8.3 (L) 04/21/2017 0419   CALCIUM 9.5 03/18/2017 1433   PROT 7.3 03/18/2017 1433   ALBUMIN 3.8 03/18/2017 1433   AST 20 03/18/2017 1433   ALT 22 03/18/2017 1433   ALKPHOS 88 03/18/2017 1433   BILITOT 0.52 03/18/2017 1433   GFRNONAA >60 04/21/2017 0419   GFRAA >60 04/21/2017 0419    INo results found for: SPEP, UPEP  Lab Results  Component Value Date   WBC 6.8 04/12/2017   NEUTROABS 5.7 03/18/2017   HGB 12.9 04/12/2017   HCT 39.8 04/12/2017   MCV 90.9  04/12/2017   PLT 320 04/12/2017      Chemistry      Component Value Date/Time   NA 138 04/21/2017 0419   NA 142 03/18/2017 1433   K 3.8 04/21/2017 0419   K 4.4 03/18/2017 1433   CL 105 04/21/2017 0419   CO2 26 04/21/2017 0419   CO2 28 03/18/2017 1433   BUN 5 (L) 04/21/2017 0419   BUN 12.8 03/18/2017 1433   CREATININE 0.76 04/21/2017 0419   CREATININE 0.8 03/18/2017 1433      Component Value Date/Time   CALCIUM 8.3 (L) 04/21/2017 0419   CALCIUM 9.5 03/18/2017 1433   ALKPHOS 88 03/18/2017 1433   AST 20 03/18/2017 1433   ALT 22 03/18/2017 1433   BILITOT 0.52 03/18/2017 1433       No results found for: LABCA2  No components found for: LABCA125  No results for input(s): INR in the last 168 hours.  Urinalysis    Component Value Date/Time   LABSPEC 1.020 05/30/2015 1502   PHURINE 6.0 05/30/2015 1502   GLUCOSEU Negative 05/30/2015 1502   HGBUR Small 05/30/2015 1502   BILIRUBINUR Negative 05/30/2015 1502   KETONESUR Negative 05/30/2015 1502   PROTEINUR Negative 05/30/2015 1502   UROBILINOGEN 0.2 05/30/2015 1502   NITRITE Negative 05/30/2015 1502   LEUKOCYTESUR Negative 05/30/2015 1502    STUDIES: Nm Sentinel Node Inj-no Rpt (breast)  Result Date: 05/07/2017 There is no Radiologist  interpretation  for this exam.  Dg Chest Port 1 View  Result Date: 04/20/2017 CLINICAL DATA:  Port-A-Cath placement. EXAM: PORTABLE CHEST 1 VIEW COMPARISON:  06/27/2015 FINDINGS: Power port placed on the right, apparently entering the internal jugular vein in the neck with the tip in the SVC at the azygos level. No pneumothorax or hemothorax. No active cardiopulmonary process. IMPRESSION: Port-A-Cath on the right with the tip in the SVC at the azygos level. Electronically Signed   By: Nelson Chimes M.D.   On: 04/20/2017 18:22   Dg Fluoro Guide Cv Line-no Report  Result Date: 04/20/2017 Fluoroscopy was utilized by the requesting physician.  No radiographic interpretation.    ASSESSMENT: 41 y.o. [place], [place] woman status post left breast upper outer quadrant biopsy 02/08/2015 for ductal carcinoma in situ, grade 2 or 3, strongly estrogen and progesterone receptor positive, with likely areas of microinvasion  (a) biopsy of an area in the left breast upper outer quadrant showed PASH  (b) biopsy of 2 additional questionable areas, one in each breast, showed bilateral fibroadenomas  (1) genetics testing March 2016 through the BreastNext gene panel offered by [employer] showed no deleterious mutations in ATM, BARD1, BRCA1, BRCA2, BRIP1, CDH1, CHEK2, MRE11A, MUTYH, NBN, NF1, PALB2, PTEN, RAD50, RAD51C, RAD51D, and TP53.  (2) status post left lumpectomy with sentinel lymph node sampling 03/27/2015 for a pT1c pN0, stage IA invasive ductal carcinoma, grade 3, triple negative, with an MIB-1 of 33%  (a) close margins were cleared with subsequent excision 04/05/2015.  (3) Oncotype DX score of 38 predicts a risk of 26% outside the breast recurrence within 10 years if the patient's only systemic treatment is tamoxifen for 5 years  (4) adjuvant chemotherapy started 05/02/2015 consisting of doxorubicin and cyclophosphamide in dose dense fashion 4, completed 06/13/2015, followed by paclitaxel weekly 12.   (a)  Paclitaxel stopped after only 2 cycles because of persistent neuropathy.  (5) adjuvant radiation completed 11/08 questionable fingers of his of 4 g packet/2016  (5) tamoxifen started 02/20/2015 (neoadjuvantly), discontinued 04/19/2015 so as not to  overlap chemotherapy; resumed 02/07/2016, discontinued April 2018 with disease recurrence  RECURRENT DISEASE: April 2018 (6) left breast lower outer quadrant biopsy 03/05/2017 shows invasive ductal carcinoma, grade 2, triple negative  (7) status post bilateral total mastectomy and left axillary lymph node dissection 04/20/2017 showing a residual left pT2 pN0 (8 nodes removed) invasive ductal carcinoma, grade 3, with negative margins; the right breast was benign  (8) chemotherapy for her recurrence will consist of cyclophosphamide, methotrexate and fluorouracil (CMF) given every 21 days 8, beginning 05/13/2017  PLAN I spent approximately 30 minutes with Lurleen with most of that time spent discussing her complex situation. Specifically she understands chemotherapy after resection of local recurrence is standard. What is not standard's which chemotherapy to give. She already received cyclophosphamide doxorubicin and paclitaxel adjuvantly. She also received Taxotere previously and still has grade 1 neuropathy from this treatment.  Among many choices I offered her carboplatin/gemcitabine or CMF. The carboplatin and gemcitabine combination would be more intense but briefer, lasting only 3 months. The CMF treatments are less intense but longer, lasting 6 months.  We discussed the possible toxicities side effects and complications of the various agents. After much discussion she feels more comfortable proceeding with CMF. She feels this will least affect her quality of life and ability to work and interact with her family.  Accordingly the plan is to start CMF 05/13/2017 assuming her drains are out. Otherwise we will move it back and other week.  Azaryah  has a good understanding of this plan. She understands also the goal of treatment is cure. She will call with any problems that may develop before her next visit.   Hilma Favors, MD   05/07/2017 9:10 PM

## 2017-05-07 ENCOUNTER — Ambulatory Visit (HOSPITAL_BASED_OUTPATIENT_CLINIC_OR_DEPARTMENT_OTHER): Payer: Federal, State, Local not specified - PPO | Admitting: Oncology

## 2017-05-07 VITALS — BP 117/61 | HR 97 | Temp 98.8°F | Resp 20 | Ht 62.0 in | Wt 211.6 lb

## 2017-05-07 DIAGNOSIS — T451X5A Adverse effect of antineoplastic and immunosuppressive drugs, initial encounter: Secondary | ICD-10-CM

## 2017-05-07 DIAGNOSIS — C50912 Malignant neoplasm of unspecified site of left female breast: Secondary | ICD-10-CM

## 2017-05-07 DIAGNOSIS — C50412 Malignant neoplasm of upper-outer quadrant of left female breast: Secondary | ICD-10-CM

## 2017-05-07 DIAGNOSIS — Z171 Estrogen receptor negative status [ER-]: Secondary | ICD-10-CM

## 2017-05-07 DIAGNOSIS — G620 Drug-induced polyneuropathy: Secondary | ICD-10-CM

## 2017-05-07 MED ORDER — PROCHLORPERAZINE MALEATE 10 MG PO TABS: 10.0000 mg | ORAL_TABLET | Freq: Four times a day (QID) | ORAL | 1 refills | Status: DC | PRN

## 2017-05-07 MED ORDER — LIDOCAINE-PRILOCAINE 2.5-2.5 % EX CREA: TOPICAL_CREAM | CUTANEOUS | 3 refills | Status: DC

## 2017-05-07 NOTE — Progress Notes (Signed)
START OFF PATHWAY REGIMEN - Breast   [id number]:CMF (IV cyclophosphamide) q21 days:   A cycle is every 21 days:     Cyclophosphamide      Methotrexate      5-Fluorouracil   **Always confirm dose/schedule in your pharmacy ordering system**    Patient Characteristics: Locoregional  Recurrent Disease - Resected, ER Negative, HER2 Negative/Unknown/Equivocal Therapeutic Status: Locoregional Recurrent Disease - Resected ER Status: Negative (-) HER2 Status: Negative (-) PR Status: Negative (-) Intent of Therapy: Curative Intent, Discussed with Patient

## 2017-05-12 ENCOUNTER — Telehealth: Payer: Self-pay | Admitting: Oncology

## 2017-05-12 NOTE — Telephone Encounter (Signed)
SW PT TO CONFIRM 6/29 APPT PER [place] [facility]

## 2017-05-13 ENCOUNTER — Ambulatory Visit: Payer: 59

## 2017-05-17 ENCOUNTER — Ambulatory Visit: Payer: Federal, State, Local not specified - PPO | Admitting: Oncology

## 2017-05-20 ENCOUNTER — Other Ambulatory Visit: Payer: Self-pay | Admitting: *Deleted

## 2017-05-20 DIAGNOSIS — C50412 Malignant neoplasm of upper-outer quadrant of left female breast: Secondary | ICD-10-CM

## 2017-05-20 DIAGNOSIS — Z171 Estrogen receptor negative status [ER-]: Principal | ICD-10-CM

## 2017-05-20 DIAGNOSIS — C50912 Malignant neoplasm of unspecified site of left female breast: Secondary | ICD-10-CM

## 2017-05-21 ENCOUNTER — Ambulatory Visit (HOSPITAL_BASED_OUTPATIENT_CLINIC_OR_DEPARTMENT_OTHER): Payer: 59

## 2017-05-21 ENCOUNTER — Other Ambulatory Visit (HOSPITAL_BASED_OUTPATIENT_CLINIC_OR_DEPARTMENT_OTHER): Payer: 59

## 2017-05-21 VITALS — BP 137/74 | HR 79 | Temp 98.6°F | Resp 20

## 2017-05-21 DIAGNOSIS — C50412 Malignant neoplasm of upper-outer quadrant of left female breast: Secondary | ICD-10-CM

## 2017-05-21 DIAGNOSIS — Z5111 Encounter for antineoplastic chemotherapy: Secondary | ICD-10-CM

## 2017-05-21 DIAGNOSIS — G620 Drug-induced polyneuropathy: Secondary | ICD-10-CM

## 2017-05-21 DIAGNOSIS — Z171 Estrogen receptor negative status [ER-]: Secondary | ICD-10-CM

## 2017-05-21 DIAGNOSIS — T451X5A Adverse effect of antineoplastic and immunosuppressive drugs, initial encounter: Secondary | ICD-10-CM

## 2017-05-21 DIAGNOSIS — C50912 Malignant neoplasm of unspecified site of left female breast: Secondary | ICD-10-CM

## 2017-05-21 LAB — COMPREHENSIVE METABOLIC PANEL
ALBUMIN: 3.7 g/dL (ref 3.5–5.0)
ALT: 18 U/L (ref 0–55)
AST: 17 U/L (ref 5–34)
Alkaline Phosphatase: 105 U/L (ref 40–150)
Anion Gap: 12 mEq/L — ABNORMAL HIGH (ref 3–11)
BILIRUBIN TOTAL: 0.65 mg/dL (ref 0.20–1.20)
BUN: 14.3 mg/dL (ref 7.0–26.0)
CO2: 27 mEq/L (ref 22–29)
Calcium: 10 mg/dL (ref 8.4–10.4)
Chloride: 105 mEq/L (ref 98–109)
Creatinine: 0.8 mg/dL (ref 0.6–1.1)
EGFR: 87 mL/min/{1.73_m2} — AB (ref >90)
GLUCOSE: 113 mg/dL (ref 70–140)
POTASSIUM: 4.4 meq/L (ref 3.5–5.1)
SODIUM: 143 meq/L (ref 136–145)
TOTAL PROTEIN: 7.6 g/dL (ref 6.4–8.3)

## 2017-05-21 LAB — DRAW EXTRA CLOT TUBE

## 2017-05-21 LAB — CBC WITH DIFFERENTIAL/PLATELET
BASO%: 0.9 % (ref 0.0–2.0)
Basophils Absolute: 0.1 10*3/uL (ref 0.0–0.1)
EOS ABS: 0.3 10*3/uL (ref 0.0–0.5)
EOS%: 4.2 % (ref 0.0–7.0)
HCT: 33.9 % — ABNORMAL LOW (ref 34.8–46.6)
HEMOGLOBIN: 11.4 g/dL — AB (ref 11.6–15.9)
LYMPH%: 27.3 % (ref 14.0–49.7)
MCH: 29.6 pg (ref 25.1–34.0)
MCHC: 33.5 g/dL (ref 31.5–36.0)
MCV: 88.4 fL (ref 79.5–101.0)
MONO#: 0.5 10*3/uL (ref 0.1–0.9)
MONO%: 7.7 % (ref 0.0–14.0)
NEUT%: 59.9 % (ref 38.4–76.8)
NEUTROS ABS: 4.1 10*3/uL (ref 1.5–6.5)
Platelets: 353 10*3/uL (ref 145–400)
RBC: 3.83 10*6/uL (ref 3.70–5.45)
RDW: 13.6 % (ref 11.2–14.5)
WBC: 6.8 10*3/uL (ref 3.9–10.3)
lymph#: 1.9 10*3/uL (ref 0.9–3.3)

## 2017-05-21 MED ORDER — METHOTREXATE SODIUM (PF) CHEMO INJECTION 250 MG/10ML
40.0000 mg/m2 | Freq: Once | INTRAMUSCULAR | Status: AC
  Administered 2017-05-21: 82 mg via INTRAVENOUS
  Filled 2017-05-21: qty 3.28

## 2017-05-21 MED ORDER — PALONOSETRON HCL INJECTION 0.25 MG/5ML
INTRAVENOUS | Status: AC
  Filled 2017-05-21: qty 5

## 2017-05-21 MED ORDER — DEXAMETHASONE SODIUM PHOSPHATE 10 MG/ML IJ SOLN
10.0000 mg | Freq: Once | INTRAMUSCULAR | Status: AC
  Administered 2017-05-21: 10 mg via INTRAVENOUS

## 2017-05-21 MED ORDER — SODIUM CHLORIDE 0.9% FLUSH
10.0000 mL | INTRAVENOUS | Status: DC | PRN
  Administered 2017-05-21: 10 mL
  Filled 2017-05-21: qty 10

## 2017-05-21 MED ORDER — PALONOSETRON HCL INJECTION 0.25 MG/5ML
0.2500 mg | Freq: Once | INTRAVENOUS | Status: AC
  Administered 2017-05-21: 0.25 mg via INTRAVENOUS

## 2017-05-21 MED ORDER — DEXAMETHASONE SODIUM PHOSPHATE 10 MG/ML IJ SOLN
INTRAMUSCULAR | Status: AC
Start: 2017-05-21 — End: 2017-05-21
  Filled 2017-05-21: qty 1

## 2017-05-21 MED ORDER — HEPARIN SOD (PORK) LOCK FLUSH 100 UNIT/ML IV SOLN
500.0000 [IU] | Freq: Once | INTRAVENOUS | Status: AC | PRN
  Administered 2017-05-21: 500 [IU]
  Filled 2017-05-21: qty 5

## 2017-05-21 MED ORDER — SODIUM CHLORIDE 0.9 % IV SOLN
Freq: Once | INTRAVENOUS | Status: AC
  Administered 2017-05-21: 10:00:00 via INTRAVENOUS

## 2017-05-21 MED ORDER — FLUOROURACIL CHEMO INJECTION 2.5 GM/50ML
600.0000 mg/m2 | Freq: Once | INTRAVENOUS | Status: AC
  Administered 2017-05-21: 1250 mg via INTRAVENOUS
  Filled 2017-05-21: qty 25

## 2017-05-21 MED ORDER — SODIUM CHLORIDE 0.9 % IV SOLN
600.0000 mg/m2 | Freq: Once | INTRAVENOUS | Status: AC
  Administered 2017-05-21: 1240 mg via INTRAVENOUS
  Filled 2017-05-21: qty 62

## 2017-05-21 NOTE — Patient Instructions (Signed)
[facility] Discharge Instructions for Patients Receiving Chemotherapy  Today you received the following chemotherapy agents Cytoxan, Methotrexate, Adrucil  To help prevent nausea and vomiting after your treatment, we encourage you to take your nausea medication   If you develop nausea and vomiting that is not controlled by your nausea medication, call the clinic.   BELOW ARE SYMPTOMS THAT SHOULD BE REPORTED IMMEDIATELY:  *FEVER GREATER THAN 100.5 F  *CHILLS WITH OR WITHOUT FEVER  NAUSEA AND VOMITING THAT IS NOT CONTROLLED WITH YOUR NAUSEA MEDICATION  *UNUSUAL SHORTNESS OF BREATH  *UNUSUAL BRUISING OR BLEEDING  TENDERNESS IN MOUTH AND THROAT WITH OR WITHOUT PRESENCE OF ULCERS  *URINARY PROBLEMS  *BOWEL PROBLEMS  UNUSUAL RASH Items with * indicate a potential emergency and should be followed up as soon as possible.  Feel free to call the clinic you have any questions or concerns. The clinic phone number is [phone].  Please show the [facility] at check-in to the Emergency Department and triage nurse.  Cyclophosphamide injection What is this medicine? CYCLOPHOSPHAMIDE (sye kloe FOSS fa mide) is a chemotherapy drug. It slows the growth of cancer cells. This medicine is used to treat many types of cancer like lymphoma, myeloma, leukemia, breast cancer, and ovarian cancer, to name a few. This medicine may be used for other purposes; ask your health care provider or pharmacist if you have questions. COMMON BRAND NAME(S): Cytoxan, Neosar What should I tell my health care provider before I take this medicine? They need to know if you have any of these conditions: -blood disorders -history of other chemotherapy -infection -kidney disease -liver disease -recent or ongoing radiation therapy -tumors in the bone marrow -an unusual or allergic reaction to cyclophosphamide, other chemotherapy, other medicines, foods, dyes, or preservatives -pregnant or  trying to get pregnant -breast-feeding How should I use this medicine? This drug is usually given as an injection into a vein or muscle or by infusion into a vein. It is administered in a hospital or clinic by a specially trained health care professional. Talk to your pediatrician regarding the use of this medicine in children. Special care may be needed. Overdosage: If you think you have taken too much of this medicine contact a poison control center or emergency room at once. NOTE: This medicine is only for you. Do not share this medicine with others. What if I miss a dose? It is important not to miss your dose. Call your doctor or health care professional if you are unable to keep an appointment. What may interact with this medicine? This medicine may interact with the following medications: -amiodarone -amphotericin B -azathioprine -certain antiviral medicines for HIV or AIDS such as protease inhibitors (e.g., indinavir, ritonavir) and zidovudine -certain blood pressure medications such as benazepril, captopril, enalapril, fosinopril, lisinopril, moexipril, monopril, perindopril, quinapril, ramipril, trandolapril -certain cancer medications such as anthracyclines (e.g., daunorubicin, doxorubicin), busulfan, cytarabine, paclitaxel, pentostatin, tamoxifen, trastuzumab -certain diuretics such as chlorothiazide, chlorthalidone, hydrochlorothiazide, indapamide, metolazone -certain medicines that treat or prevent blood clots like warfarin -certain muscle relaxants such as succinylcholine -cyclosporine -etanercept -indomethacin -medicines to increase blood counts like filgrastim, pegfilgrastim, sargramostim -medicines used as general anesthesia -metronidazole -natalizumab This list may not describe all possible interactions. Give your health care provider a list of all the medicines, herbs, non-prescription drugs, or dietary supplements you use. Also tell them if you smoke, drink alcohol, or  use illegal drugs. Some items may interact with your medicine. What should I watch for while using  this medicine? Visit your doctor for checks on your progress. This drug may make you feel generally unwell. This is not uncommon, as chemotherapy can affect healthy cells as well as cancer cells. Report any side effects. Continue your course of treatment even though you feel ill unless your doctor tells you to stop. Drink water or other fluids as directed. Urinate often, even at night. In some cases, you may be given additional medicines to help with side effects. Follow all directions for their use. Call your doctor or health care professional for advice if you get a fever, chills or sore throat, or other symptoms of a cold or flu. Do not treat yourself. This drug decreases your body's ability to fight infections. Try to avoid being around people who are sick. This medicine may increase your risk to bruise or bleed. Call your doctor or health care professional if you notice any unusual bleeding. Be careful brushing and flossing your teeth or using a toothpick because you may get an infection or bleed more easily. If you have any dental work done, tell your dentist you are receiving this medicine. You may get drowsy or dizzy. Do not drive, use machinery, or do anything that needs mental alertness until you know how this medicine affects you. Do not become pregnant while taking this medicine or for 1 year after stopping it. Women should inform their doctor if they wish to become pregnant or think they might be pregnant. Men should not father a child while taking this medicine and for 4 months after stopping it. There is a potential for serious side effects to an unborn child. Talk to your health care professional or pharmacist for more information. Do not breast-feed an infant while taking this medicine. This medicine may interfere with the ability to have a child. This medicine has caused ovarian failure in  some women. This medicine has caused reduced sperm counts in some men. You should talk with your doctor or health care professional if you are concerned about your fertility. If you are going to have surgery, tell your doctor or health care professional that you have taken this medicine. What side effects may I notice from receiving this medicine? Side effects that you should report to your doctor or health care professional as soon as possible: -allergic reactions like skin rash, itching or hives, swelling of the face, lips, or tongue -low blood counts - this medicine may decrease the number of white blood cells, red blood cells and platelets. You may be at increased risk for infections and bleeding. -signs of infection - fever or chills, cough, sore throat, pain or difficulty passing urine -signs of decreased platelets or bleeding - bruising, pinpoint red spots on the skin, black, tarry stools, blood in the urine -signs of decreased red blood cells - unusually weak or tired, fainting spells, lightheadedness -breathing problems -dark urine -dizziness -palpitations -swelling of the ankles, feet, hands -trouble passing urine or change in the amount of urine -weight gain -yellowing of the eyes or skin Side effects that usually do not require medical attention (report to your doctor or health care professional if they continue or are bothersome): -changes in nail or skin color -hair loss -missed menstrual periods -mouth sores -nausea, vomiting This list may not describe all possible side effects. Call your doctor for medical advice about side effects. You may report side effects to FDA at 1-800-FDA-1088. Where should I keep my medicine? This drug is given in a hospital or clinic and   will not be stored at home. NOTE: This sheet is a summary. It may not cover all possible information. If you have questions about this medicine, talk to your doctor, pharmacist, or health care provider.  2018  [employer]vier/[employer] Standard (2012-09-23 16:22:58)  Methotrexate injection What is this medicine? METHOTREXATE (METH oh TREX ate) is a chemotherapy drug used to treat cancer including breast cancer, leukemia, and lymphoma. This medicine can also be used to treat psoriasis and certain kinds of arthritis. This medicine may be used for other purposes; ask your health care provider or pharmacist if you have questions. What should I tell my health care provider before I take this medicine? They need to know if you have any of these conditions: -fluid in the stomach area or lungs -if you often drink alcohol -infection or immune system problems -kidney disease -liver disease -low blood counts, like low white cell, platelet, or red cell counts -lung disease -radiation therapy -stomach ulcers -ulcerative colitis -an unusual or allergic reaction to methotrexate, other medicines, foods, dyes, or preservatives -pregnant or trying to get pregnant -breast-feeding How should I use this medicine? This medicine is for infusion into a vein or for injection into muscle or into the spinal fluid (whichever applies). It is usually given by a health care professional in a hospital or clinic setting. In rare cases, you might get this medicine at home. You will be taught how to give this medicine. Use exactly as directed. Take your medicine at regular intervals. Do not take your medicine more often than directed. If this medicine is used for arthritis or psoriasis, it should be taken weekly, NOT daily. It is important that you put your used needles and syringes in a special sharps container. Do not put them in a trash can. If you do not have a sharps container, call your pharmacist or healthcare provider to get one. Talk to your pediatrician regarding the use of this medicine in children. While this drug may be prescribed for children as young as 2 years for selected conditions, precautions do apply. Overdosage: If you  think you have taken too much of this medicine contact a poison control center or emergency room at once. NOTE: This medicine is only for you. Do not share this medicine with others. What if I miss a dose? It is important not to miss your dose. Call your doctor or health care professional if you are unable to keep an appointment. If you give yourself the medicine and you miss a dose, talk with your doctor or health care professional. Do not take double or extra doses. What may interact with this medicine? This medicine may interact with the following medications: -acitretin -aspirin or aspirin-like medicines including salicylates -azathioprine -certain antibiotics like chloramphenicol, penicillin, tetracycline -certain medicines for stomach problems like esomeprazole, omeprazole, pantoprazole -cyclosporine -gold -hydroxychloroquine -live virus vaccines -mercaptopurine -NSAIDs, medicines for pain and inflammation, like ibuprofen or naproxen -other cytotoxic agents -penicillamine -phenylbutazone -phenytoin -probenacid -retinoids such as isotretinoin and tretinoin -steroid medicines like prednisone or cortisone -sulfonamides like sulfasalazine and trimethoprim/sulfamethoxazole -theophylline This list may not describe all possible interactions. Give your health care provider a list of all the medicines, herbs, non-prescription drugs, or dietary supplements you use. Also tell them if you smoke, drink alcohol, or use illegal drugs. Some items may interact with your medicine. What should I watch for while using this medicine? Avoid alcoholic drinks. In some cases, you may be given additional medicines to help with side effects. Follow all directions for  their use. This medicine can make you more sensitive to the sun. Keep out of the sun. If you cannot avoid being in the sun, wear protective clothing and use sunscreen. Do not use sun lamps or tanning beds/booths. You may get drowsy or dizzy.  Do not drive, use machinery, or do anything that needs mental alertness until you know how this medicine affects you. Do not stand or sit up quickly, especially if you are an older patient. This reduces the risk of dizzy or fainting spells. You may need blood work done while you are taking this medicine. Call your doctor or health care professional for advice if you get a fever, chills or sore throat, or other symptoms of a cold or flu. Do not treat yourself. This drug decreases your body's ability to fight infections. Try to avoid being around people who are sick. This medicine may increase your risk to bruise or bleed. Call your doctor or health care professional if you notice any unusual bleeding. Check with your doctor or health care professional if you get an attack of severe diarrhea, nausea and vomiting, or if you sweat a lot. The loss of too much body fluid can make it dangerous for you to take this medicine. Talk to your doctor about your risk of cancer. You may be more at risk for certain types of cancers if you take this medicine. Both men and women must use effective birth control with this medicine. Do not become pregnant while taking this medicine or until at least 1 normal menstrual cycle has occurred after stopping it. Women should inform their doctor if they wish to become pregnant or think they might be pregnant. Men should not father a child while taking this medicine and for 3 months after stopping it. There is a potential for serious side effects to an unborn child. Talk to your health care professional or pharmacist for more information. Do not breast-feed an infant while taking this medicine. What side effects may I notice from receiving this medicine? Side effects that you should report to your doctor or health care professional as soon as possible: -allergic reactions like skin rash, itching or hives, swelling of the face, lips, or tongue -back pain -breathing problems or  shortness of breath -confusion -diarrhea -dry, nonproductive cough -low blood counts - this medicine may decrease the number of white blood cells, red blood cells and platelets. You may be at increased risk of infections and bleeding -mouth sores -redness, blistering, peeling or loosening of the skin, including inside the mouth -seizures -severe headaches -signs of infection - fever or chills, cough, sore throat, pain or difficulty passing urine -signs and symptoms of bleeding such as bloody or black, tarry stools; red or dark-brown urine; spitting up blood or brown material that looks like coffee grounds; red spots on the skin; unusual bruising or bleeding from the eye, gums, or nose -signs and symptoms of kidney injury like trouble passing urine or change in the amount of urine -signs and symptoms of liver injury like dark yellow or brown urine; general ill feeling or flu-like symptoms; light-colored stools; loss of appetite; nausea; right upper belly pain; unusually weak or tired; yellowing of the eyes or skin -stiff neck -vomiting Side effects that usually do not require medical attention (report to your doctor or health care professional if they continue or are bothersome): -dizziness -hair loss -headache -stomach pain -upset stomach This list may not describe all possible side effects. Call your doctor for medical  advice about side effects. You may report side effects to FDA at 1-800-FDA-1088. Where should I keep my medicine? If you are using this medicine at home, you will be instructed on how to store this medicine. Throw away any unused medicine after the expiration date on the label. NOTE: This sheet is a summary. It may not cover all possible information. If you have questions about this medicine, talk to your doctor, pharmacist, or health care provider.  2018 [employer]/Gold Standard (2015-02-28 12:36:41)  Fluorouracil, 5-FU injection What is this medicine? FLUOROURACIL, 5-FU  (flure oh YOOR a sil) is a chemotherapy drug. It slows the growth of cancer cells. This medicine is used to treat many types of cancer like breast cancer, colon or rectal cancer, pancreatic cancer, and stomach cancer. This medicine may be used for other purposes; ask your health care provider or pharmacist if you have questions. COMMON BRAND NAME(S): Adrucil What should I tell my health care provider before I take this medicine? They need to know if you have any of these conditions: -blood disorders -dihydropyrimidine dehydrogenase (DPD) deficiency -infection (especially a virus infection such as chickenpox, cold sores, or herpes) -kidney disease -liver disease -malnourished, poor nutrition -recent or ongoing radiation therapy -an unusual or allergic reaction to fluorouracil, other chemotherapy, other medicines, foods, dyes, or preservatives -pregnant or trying to get pregnant -breast-feeding How should I use this medicine? This drug is given as an infusion or injection into a vein. It is administered in a hospital or clinic by a specially trained health care professional. Talk to your pediatrician regarding the use of this medicine in children. Special care may be needed. Overdosage: If you think you have taken too much of this medicine contact a poison control center or emergency room at once. NOTE: This medicine is only for you. Do not share this medicine with others. What if I miss a dose? It is important not to miss your dose. Call your doctor or health care professional if you are unable to keep an appointment. What may interact with this medicine? -allopurinol -cimetidine -dapsone -digoxin -hydroxyurea -leucovorin -levamisole -medicines for seizures like ethotoin, fosphenytoin, phenytoin -medicines to increase blood counts like filgrastim, pegfilgrastim, sargramostim -medicines that treat or prevent blood clots like warfarin, enoxaparin, and  dalteparin -methotrexate -metronidazole -pyrimethamine -some other chemotherapy drugs like busulfan, cisplatin, estramustine, vinblastine -trimethoprim -trimetrexate -vaccines Talk to your doctor or health care professional before taking any of these medicines: -acetaminophen -aspirin -ibuprofen -ketoprofen -naproxen This list may not describe all possible interactions. Give your health care provider a list of all the medicines, herbs, non-prescription drugs, or dietary supplements you use. Also tell them if you smoke, drink alcohol, or use illegal drugs. Some items may interact with your medicine. What should I watch for while using this medicine? Visit your doctor for checks on your progress. This drug may make you feel generally unwell. This is not uncommon, as chemotherapy can affect healthy cells as well as cancer cells. Report any side effects. Continue your course of treatment even though you feel ill unless your doctor tells you to stop. In some cases, you may be given additional medicines to help with side effects. Follow all directions for their use. Call your doctor or health care professional for advice if you get a fever, chills or sore throat, or other symptoms of a cold or flu. Do not treat yourself. This drug decreases your body's ability to fight infections. Try to avoid being around people who are sick. This medicine  may increase your risk to bruise or bleed. Call your doctor or health care professional if you notice any unusual bleeding. Be careful brushing and flossing your teeth or using a toothpick because you may get an infection or bleed more easily. If you have any dental work done, tell your dentist you are receiving this medicine. Avoid taking products that contain aspirin, acetaminophen, ibuprofen, naproxen, or ketoprofen unless instructed by your doctor. These medicines may hide a fever. Do not become pregnant while taking this medicine. Women should inform their  doctor if they wish to become pregnant or think they might be pregnant. There is a potential for serious side effects to an unborn child. Talk to your health care professional or pharmacist for more information. Do not breast-feed an infant while taking this medicine. Men should inform their doctor if they wish to father a child. This medicine may lower sperm counts. Do not treat diarrhea with over the counter products. Contact your doctor if you have diarrhea that lasts more than 2 days or if it is severe and watery. This medicine can make you more sensitive to the sun. Keep out of the sun. If you cannot avoid being in the sun, wear protective clothing and use sunscreen. Do not use sun lamps or tanning beds/booths. What side effects may I notice from receiving this medicine? Side effects that you should report to your doctor or health care professional as soon as possible: -allergic reactions like skin rash, itching or hives, swelling of the face, lips, or tongue -low blood counts - this medicine may decrease the number of white blood cells, red blood cells and platelets. You may be at increased risk for infections and bleeding. -signs of infection - fever or chills, cough, sore throat, pain or difficulty passing urine -signs of decreased platelets or bleeding - bruising, pinpoint red spots on the skin, black, tarry stools, blood in the urine -signs of decreased red blood cells - unusually weak or tired, fainting spells, lightheadedness -breathing problems -changes in vision -chest pain -mouth sores -nausea and vomiting -pain, swelling, redness at site where injected -pain, tingling, numbness in the hands or feet -redness, swelling, or sores on hands or feet -stomach pain -unusual bleeding Side effects that usually do not require medical attention (report to your doctor or health care professional if they continue or are bothersome): -changes in finger or toe nails -diarrhea -dry or itchy  skin -hair loss -headache -loss of appetite -sensitivity of eyes to the light -stomach upset -unusually teary eyes This list may not describe all possible side effects. Call your doctor for medical advice about side effects. You may report side effects to FDA at 1-800-FDA-1088. Where should I keep my medicine? This drug is given in a hospital or clinic and will not be stored at home. NOTE: This sheet is a summary. It may not cover all possible information. If you have questions about this medicine, talk to your doctor, pharmacist, or health care provider.  2018 [employer]vier/Gold Standard (2008-03-14 13:53:16)

## 2017-05-27 ENCOUNTER — Other Ambulatory Visit: Payer: Self-pay

## 2017-05-27 DIAGNOSIS — C50412 Malignant neoplasm of upper-outer quadrant of left female breast: Secondary | ICD-10-CM

## 2017-05-27 DIAGNOSIS — Z171 Estrogen receptor negative status [ER-]: Principal | ICD-10-CM

## 2017-05-28 ENCOUNTER — Ambulatory Visit (HOSPITAL_BASED_OUTPATIENT_CLINIC_OR_DEPARTMENT_OTHER): Payer: 59 | Admitting: Oncology

## 2017-05-28 ENCOUNTER — Other Ambulatory Visit (HOSPITAL_BASED_OUTPATIENT_CLINIC_OR_DEPARTMENT_OTHER): Payer: 59

## 2017-05-28 VITALS — BP 134/92 | HR 90 | Temp 98.3°F | Resp 20 | Ht 62.0 in | Wt 210.5 lb

## 2017-05-28 DIAGNOSIS — C50412 Malignant neoplasm of upper-outer quadrant of left female breast: Secondary | ICD-10-CM | POA: Diagnosis not present

## 2017-05-28 DIAGNOSIS — Z171 Estrogen receptor negative status [ER-]: Secondary | ICD-10-CM

## 2017-05-28 DIAGNOSIS — C50912 Malignant neoplasm of unspecified site of left female breast: Secondary | ICD-10-CM

## 2017-05-28 LAB — COMPREHENSIVE METABOLIC PANEL
ALBUMIN: 3.9 g/dL (ref 3.5–5.0)
ALK PHOS: 97 U/L (ref 40–150)
ALT: 16 U/L (ref 0–55)
AST: 15 U/L (ref 5–34)
Anion Gap: 11 mEq/L (ref 3–11)
BILIRUBIN TOTAL: 0.54 mg/dL (ref 0.20–1.20)
BUN: 11.8 mg/dL (ref 7.0–26.0)
CALCIUM: 9.7 mg/dL (ref 8.4–10.4)
CO2: 25 mEq/L (ref 22–29)
CREATININE: 0.8 mg/dL (ref 0.6–1.1)
Chloride: 104 mEq/L (ref 98–109)
EGFR: 89 mL/min/{1.73_m2} — ABNORMAL LOW (ref >90)
Glucose: 124 mg/dl (ref 70–140)
POTASSIUM: 4.4 meq/L (ref 3.5–5.1)
Sodium: 139 mEq/L (ref 136–145)
Total Protein: 7.7 g/dL (ref 6.4–8.3)

## 2017-05-28 LAB — CBC WITH DIFFERENTIAL/PLATELET
BASO%: 0.4 % (ref 0.0–2.0)
BASOS ABS: 0 10*3/uL (ref 0.0–0.1)
EOS ABS: 0.3 10*3/uL (ref 0.0–0.5)
EOS%: 4.5 % (ref 0.0–7.0)
HEMATOCRIT: 34.7 % — AB (ref 34.8–46.6)
HEMOGLOBIN: 11.2 g/dL — AB (ref 11.6–15.9)
LYMPH#: 1.8 10*3/uL (ref 0.9–3.3)
LYMPH%: 25.8 % (ref 14.0–49.7)
MCH: 28.9 pg (ref 25.1–34.0)
MCHC: 32.3 g/dL (ref 31.5–36.0)
MCV: 89.4 fL (ref 79.5–101.0)
MONO#: 0.4 10*3/uL (ref 0.1–0.9)
MONO%: 6.1 % (ref 0.0–14.0)
NEUT#: 4.4 10*3/uL (ref 1.5–6.5)
NEUT%: 63.2 % (ref 38.4–76.8)
Platelets: 355 10*3/uL (ref 145–400)
RBC: 3.88 10*6/uL (ref 3.70–5.45)
RDW: 13.1 % (ref 11.2–14.5)
WBC: 6.9 10*3/uL (ref 3.9–10.3)

## 2017-05-28 MED ORDER — DEXAMETHASONE 4 MG PO TABS: ORAL_TABLET | ORAL | 1 refills | Status: DC

## 2017-05-28 NOTE — Progress Notes (Signed)
[place]  [phone] [phone]     ID: Cheryl [name] DOB: 03-16-1976  MR#: [id number]  ACZ#:[id number]  Patient Care Team: [facility], [facility] as PCP - General (Family Medicine) [name], MD as Consulting Physician (Obstetrics and Gynecology) [name], MD as Consulting Physician (General Surgery) Ardie Mclennan, Virgie Dad, MD as Consulting Physician (Oncology) [name], RN as Registered Nurse [name], RN as Registered Nurse Dillingham, [name], DO as Attending Physician (Plastic Surgery) PCP: [facility], [facility] OTHER MD:  CHIEF COMPLAINT: Recurrent triple negative breast cancer  CURRENT TREATMENT: Adjuvant chemotherapy   INTERVAL HISTORY:  Cheryl [name] returns today for follow-up and treatment of her recurrent triple negative breast cancer. Today is day 8 cycle 1 of 8 planned cycles of CMF chemotherapy.  She did generally well with the treatment. She was not too tired but she was already fairly tired from the prior surgeries. She did have nausea days to 3 and 4. She did not vomit but the nausea was unpleasant and not controlled on Compazine 42 twice a day. She did not have problems with her port, no problems with mouth sores, no fever, rash, or bleeding problems. So far she has had no hair loss   REVIEW OF SYSTEMS: Leianne is having some heartburn. [name] doing some housework, but not exercising yet. She still is not quite up for it. Her left sided expander keeps slipping towards her axilla. She has brought this up with Dr. Marla Roe ham. She was advised to progress with her arms in word, but her left arm is the one where she had the lymph nodes removed and it is very strange feeling and this is a difficult thing for her to do. [name] hoping to be able to return to work next week. Aside from these issues a detailed review of systems today is stable   HISTORY OF RECURRENT DISEASE: From my  03/18/2017 note:  Cheryl [name] that he'll returns today for follow-up of her triple negative breast cancer, which is now recurrent. She had routine bilateral diagnostic mammography at the [facility] 03/10/2017 and this showed the breast density to be category C. The old lumpectomy site, which was in the upper outer quadrant, appears stable. However there was a new area of asymmetry in the lower inner quadrant of the left breast. Ultrasound confirmed this to be a 1.0 cm mass at the 7:00 radiant 5 cm from the nipple. Aspiration was attempted but no fluid was aspirated and biopsy of this mass was obtained 03/05/2017. This showed ([id number]) invasive mammary carcinoma, grade 2, estrogen and progesterone receptor negative, HER-2 not amplified with a signals ratio 1.17 and a number per cell of 2.45, with an MIB-1 of 15%.  [name] here today to discuss subsequent management   BREAST CANCER HISTORY: From the original intake note:  The patient had screening mammography 02/01/2015 which showed a calcifications in the upper outer quadrant of the left breast. On 02/08/2015 she underwent left diagnostic mammography with tomosynthesis and left breast ultrasonography at the breast Center. The breast density was category C. Mammography confirmed a group of pleomorphic calcifications spanning 4.2 cm maximally. Physical examination of the upper outer quadrant of the left breast showed an area of thickening at the approximate 2:00 position. Ultrasound of this area showed no discrete masses. There was vague shadowing present. Calcifications could not be clearly identified sonographically. There was no lymphadenopathy in the left axilla.  On the same day the patient underwent  biopsy of the left breast area in question, with the pathology (S [id number]) showing ductal carcinoma in situ, grade 2 or 3, with possible areas of microinvasion, the cancer cells being strongly estrogen and progesterone receptor positive, both at  100%.  On 02/15/2015 the patient underwent bilateral breast MRI. This showed the area of malignancy in the upper outer quadrant to measure 4.5 cm, including a large area of non-masslike enhancement and a 1 cm spiculated mass. There was also an indeterminate oval mass in the central left breast and another indeterminant mass in the slightly outer right breast anteriorly. Biopsy of the right breast mass 02/18/2015 ([id number]) showed a fibroadenoma, with no evidence of malignancy. Biopsy of 2 additional areas of the left breast (at 10:00 and 2:00) showed a fibroadenoma in the upper inner quadrant, and pseudo-angiomatous stromal hyperplasia (PA SH) at the 2:00 area of the left breast.  The patient's subsequent history is as detailed below.   PAST MEDICAL HISTORY: Past Medical History:  Diagnosis Date  . Breast cancer ([place])    left breast  . Wears contact lenses     PAST SURGICAL HISTORY: Past Surgical History:  Procedure Laterality Date  . BILATERAL TOTAL MASTECTOMY WITH AXILLARY LYMPH NODE DISSECTION  04/20/2017  . BREAST BIOPSY Left 02/18/2015  . BREAST BIOPSY Right 02/18/2015  . BREAST EXCISIONAL BIOPSY Left 04/05/2015  . BREAST LUMPECTOMY Left 03/27/2015  . BREAST LUMPECTOMY WITH NEEDLE LOCALIZATION AND AXILLARY SENTINEL LYMPH NODE BX Left 03/27/2015   Procedure: LEFT BREAST LUMPECTOMY WITH BRACKETED NEEDLE LOCALIZATION AND LEFT  AXILLARY SENTINEL LYMPH NODE BX;  Surgeon: [name], MD;  Location: [place];  Service: General;  Laterality: Left;  . BREAST RECONSTRUCTION WITH PLACEMENT OF TISSUE EXPANDER AND FLEX HD (ACELLULAR HYDRATED DERMIS) Bilateral 04/20/2017   Procedure: BILATARAL BREAST RECONSTRUCTION WITH PLACEMENT OF TISSUE EXPANDER AND FLEX HD (ACELLULAR HYDRATED DERMIS);  Surgeon: Wallace Going, DO;  Location: [place];  Service: Plastics;  Laterality: Bilateral;  . BREAST SURGERY    . MASTECTOMY W/ SENTINEL NODE BIOPSY Bilateral 04/20/2017    Procedure: BILATERAL TOTAL MASTECTOMY WITH LEFT AXILLARY SENTINEL LYMPH NODE BIOPSY;  Surgeon: [name], MD;  Location: [place];  Service: General;  Laterality: Bilateral;  . PORTACATH PLACEMENT Right 04/25/2015   Procedure: INSERTION PORT-A-CATH;  Surgeon: [name], MD;  Location: [place];  Service: General;  Laterality: Right;  . PORTACATH PLACEMENT Right 04/20/2017   Procedure: INSERTION PORT-A-CATH;  Surgeon: [name], MD;  Location: [place];  Service: General;  Laterality: Right;  . RE-EXCISION OF BREAST LUMPECTOMY Left 04/05/2015   Procedure: RE-EXCISION LEFT  BREAST LUMPECTOMY;  Surgeon: [name], MD;  Location: [place];  Service: General;  Laterality: Left;    FAMILY HISTORY Family History  Problem Relation Age of Onset  . Lung cancer Maternal Grandmother 52       non smoker  . Lung cancer Maternal Grandfather 51       non smoker worked in [employer]  . Cancer Paternal Grandfather 35       throat cancer ? smoker  . Cancer Cousin 41       throat cancer ? smoker   the patient's parents are living, in their mid-50s as of March 2016. The patient had no siblings. Both the patient's mother's parents died from lung cancer although they did not smoke. [place] and her maternal grandfather was a Ecologist. There is  no history of breast or ovarian cancer in the family to her knowledge  GYNECOLOGIC HISTORY:  No LMP recorded. Patient has had an implant. Menarche age 4, first live birth age 67. The patient is GX P3. [name] having regular periods. She uses the Essure device for contraception.  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 Her husband [name] is a Freight forwarder. Their daughter [name] lives in [place] where she works in Press photographer. Daughter Summer is a Research scientist (physical sciences) and lives with the patient.. Daughter [name] also at home is 16 years old.    ADVANCED DIRECTIVES: Not  in place   HEALTH MAINTENANCE: Social History  Substance Use Topics  . Smoking status: Never Smoker  . Smokeless tobacco: Never Used  . Alcohol use 0.0 oz/week     Comment: occ     Colonoscopy:  PAP:  Bone density:  Lipid panel:  Allergies  Allergen Reactions  . Adhesive [Tape] Other (See Comments)    Skin blisters    Current Outpatient Prescriptions  Medication Sig Dispense Refill  . Ascorbic Acid (VITAMIN C) 1000 MG tablet Take 1,000 mg by mouth daily.    Marland Kitchen dexamethasone (DECADRON) 4 MG tablet Take 1 tablet twice a day with meals beginning the day after chemotherapy and continuing for 3 days 50 tablet 1  . ibuprofen (ADVIL,MOTRIN) 200 MG tablet Take 400 mg by mouth every 8 (eight) hours as needed for mild pain.    Marland Kitchen lidocaine-prilocaine (EMLA) cream Apply to affected area once 30 g 3  . prochlorperazine (COMPAZINE) 10 MG tablet Take 1 tablet (10 mg total) by mouth every 6 (six) hours as needed (Nausea or vomiting). 30 tablet 1  . venlafaxine XR (EFFEXOR-XR) 37.5 MG 24 hr capsule Take 1 capsule (37.5 mg total) by mouth daily with breakfast. (Patient not taking: Reported on 04/08/2017) 90 capsule 3   No current facility-administered medications for this visit.     OBJECTIVE:  Young-appearing white woman Who appears stated age  41:   05/28/17 1354  BP: (!) 134/92  Pulse: 90  Resp: 20  Temp: 98.3 F (36.8 C)     Body mass index is 38.5 kg/m.    ECOG FS:1 - Symptomatic but completely ambulatory  Sclerae unicteric, EOMs intact Oropharynx clear and moist No cervical or supraclavicular adenopathy Lungs no rales or rhonchi Heart regular rate and rhythm Abd soft, nontender, positive bowel sounds MSK no focal spinal tenderness, no upper extremity lymphedema Neuro: nonfocal, well oriented, appropriate affect Breasts: Status post bilateral mastectomies with bilateral expanders in place. The incisions are healing nicely. The overall appearance is symmetrical. Both  axillae are benign.  LAB RESULTS:  CMP     Component Value Date/Time   NA 139 05/28/2017 1338   K 4.4 05/28/2017 1338   CL 105 04/21/2017 0419   CO2 25 05/28/2017 1338   GLUCOSE 124 05/28/2017 1338   BUN 11.8 05/28/2017 1338   CREATININE 0.8 05/28/2017 1338   CALCIUM 9.7 05/28/2017 1338   PROT 7.7 05/28/2017 1338   ALBUMIN 3.9 05/28/2017 1338   AST 15 05/28/2017 1338   ALT 16 05/28/2017 1338   ALKPHOS 97 05/28/2017 1338   BILITOT 0.54 05/28/2017 1338   GFRNONAA >60 04/21/2017 0419   GFRAA >60 04/21/2017 0419    INo results found for: SPEP, UPEP  Lab Results  Component Value Date   WBC 6.9 05/28/2017   NEUTROABS 4.4 05/28/2017   HGB 11.2 (L) 05/28/2017   HCT 34.7 (  L) 05/28/2017   MCV 89.4 05/28/2017   PLT 355 05/28/2017      Chemistry      Component Value Date/Time   NA 139 05/28/2017 1338   K 4.4 05/28/2017 1338   CL 105 04/21/2017 0419   CO2 25 05/28/2017 1338   BUN 11.8 05/28/2017 1338   CREATININE 0.8 05/28/2017 1338      Component Value Date/Time   CALCIUM 9.7 05/28/2017 1338   ALKPHOS 97 05/28/2017 1338   AST 15 05/28/2017 1338   ALT 16 05/28/2017 1338   BILITOT 0.54 05/28/2017 1338       No results found for: LABCA2  No components found for: LABCA125  No results for input(s): INR in the last 168 hours.  Urinalysis    Component Value Date/Time   LABSPEC 1.020 05/30/2015 1502   PHURINE 6.0 05/30/2015 1502   GLUCOSEU Negative 05/30/2015 1502   HGBUR Small 05/30/2015 1502   BILIRUBINUR Negative 05/30/2015 1502   KETONESUR Negative 05/30/2015 1502   PROTEINUR Negative 05/30/2015 1502   UROBILINOGEN 0.2 05/30/2015 1502   NITRITE Negative 05/30/2015 1502   LEUKOCYTESUR Negative 05/30/2015 1502    STUDIES: No results found.  ASSESSMENT: 41 y.o. [place], [place] woman status post left breast upper outer quadrant biopsy 02/08/2015 for ductal carcinoma in situ, grade 2 or 3, strongly estrogen and progesterone receptor positive, with likely areas  of microinvasion  (a) biopsy of an area in the left breast upper outer quadrant showed PASH  (b) biopsy of 2 additional questionable areas, one in each breast, showed bilateral fibroadenomas  (1) genetics testing March 2016 through the BreastNext gene panel offered by [employer] showed no deleterious mutations in ATM, BARD1, BRCA1, BRCA2, BRIP1, CDH1, CHEK2, MRE11A, MUTYH, NBN, NF1, PALB2, PTEN, RAD50, RAD51C, RAD51D, and TP53.  (2) status post left lumpectomy with sentinel lymph node sampling 03/27/2015 for a pT1c pN0, stage IA invasive ductal carcinoma, grade 3, triple negative, with an MIB-1 of 33%  (a) close margins were cleared with subsequent excision 04/05/2015.  (3) Oncotype DX score of 38 predicts a risk of 26% outside the breast recurrence within 10 years if the patient's only systemic treatment is tamoxifen for 5 years  (4) adjuvant chemotherapy started 05/02/2015 consisting of doxorubicin and cyclophosphamide in dose dense fashion 4, completed 06/13/2015, followed by paclitaxel weekly 12.   (a) Paclitaxel stopped after only 2 cycles because of persistent neuropathy.  (5) adjuvant radiation completed 11/08 questionable fingers of his of 4 g packet/2016  (5) tamoxifen started 02/20/2015 (neoadjuvantly), discontinued 04/19/2015 so as not to overlap chemotherapy; resumed 02/07/2016, discontinued April 2018 with disease recurrence  RECURRENT DISEASE: April 2018 (6) left breast lower outer quadrant biopsy 03/05/2017 shows invasive ductal carcinoma, grade 2, triple negative  (7) status post bilateral mastectomies and left axillary lymph node dissection 04/20/2017 showing a residual left pT2 pN0 (8 nodes removed) invasive ductal carcinoma, grade 3, with negative margins; the right breast was benign  (8) chemotherapy for her recurrence will consist of cyclophosphamide, methotrexate and fluorouracil (CMF) given every 21 days 8, beginning 05/13/2017  PLAN Neesa tolerated her first  of 8 planned doses of CMF well overall and importantly there has been no significant dip in her neutrophils. This means it may be possible to simply see her on treatment days instead of also seeing her on days 8 or so. She would strongly prefer that.  She has not begun to lose her hair. It is a little early. I'm hoping though that she  will be able to keep most of it  As far as the nausea is concerned she is going to take Compazine days 23 and 4 at lunch and bedtime and she will take Decadron 4 mg at breakfast and supper. I'm hoping that we'll take care of that issue.  I have encouraged her to start taking short walks, 10 minutes at a time. If she can do that 3 times a day including one at lunch time, thus 30 minutes today and they will get her going towards a quicker and fuller recovery  As far as the fact that her left-sided expander keeps slipping towards her armpits, I suggested she take a fold a towel in place it under her bra in the lateral aspect of all of the breast, stage II mechanically keep the expander in place. She will let us know whether that works  The plan then is to continue treatment every 3 weeks to a total of 8 cycles, with visits on treatment today. She will call with any problems that may develop before the next visit. Hilma Favors, MD   05/28/2017 2:20 PM

## 2017-06-07 ENCOUNTER — Encounter: Payer: Self-pay | Admitting: Emergency Medicine

## 2017-06-07 ENCOUNTER — Telehealth: Payer: Self-pay | Admitting: Emergency Medicine

## 2017-06-09 ENCOUNTER — Other Ambulatory Visit: Payer: Self-pay | Admitting: Emergency Medicine

## 2017-06-09 DIAGNOSIS — Z171 Estrogen receptor negative status [ER-]: Principal | ICD-10-CM

## 2017-06-09 DIAGNOSIS — C50412 Malignant neoplasm of upper-outer quadrant of left female breast: Secondary | ICD-10-CM

## 2017-06-10 ENCOUNTER — Other Ambulatory Visit (HOSPITAL_BASED_OUTPATIENT_CLINIC_OR_DEPARTMENT_OTHER): Payer: 59

## 2017-06-10 ENCOUNTER — Encounter: Payer: Self-pay | Admitting: Adult Health

## 2017-06-10 ENCOUNTER — Ambulatory Visit (HOSPITAL_BASED_OUTPATIENT_CLINIC_OR_DEPARTMENT_OTHER): Payer: 59 | Admitting: Adult Health

## 2017-06-10 ENCOUNTER — Ambulatory Visit (HOSPITAL_BASED_OUTPATIENT_CLINIC_OR_DEPARTMENT_OTHER): Payer: 59

## 2017-06-10 VITALS — BP 132/95 | HR 89 | Temp 98.4°F | Resp 18 | Ht 62.0 in | Wt 210.3 lb

## 2017-06-10 DIAGNOSIS — G620 Drug-induced polyneuropathy: Secondary | ICD-10-CM

## 2017-06-10 DIAGNOSIS — Z171 Estrogen receptor negative status [ER-]: Secondary | ICD-10-CM | POA: Diagnosis not present

## 2017-06-10 DIAGNOSIS — C50912 Malignant neoplasm of unspecified site of left female breast: Secondary | ICD-10-CM

## 2017-06-10 DIAGNOSIS — T451X5A Adverse effect of antineoplastic and immunosuppressive drugs, initial encounter: Secondary | ICD-10-CM

## 2017-06-10 DIAGNOSIS — C50412 Malignant neoplasm of upper-outer quadrant of left female breast: Secondary | ICD-10-CM

## 2017-06-10 DIAGNOSIS — C773 Secondary and unspecified malignant neoplasm of axilla and upper limb lymph nodes: Secondary | ICD-10-CM

## 2017-06-10 DIAGNOSIS — Z5111 Encounter for antineoplastic chemotherapy: Secondary | ICD-10-CM

## 2017-06-10 LAB — CBC WITH DIFFERENTIAL/PLATELET
BASO%: 1.2 % (ref 0.0–2.0)
Basophils Absolute: 0.1 10*3/uL (ref 0.0–0.1)
EOS ABS: 0.1 10*3/uL (ref 0.0–0.5)
EOS%: 2.7 % (ref 0.0–7.0)
HEMATOCRIT: 36.1 % (ref 34.8–46.6)
HGB: 12 g/dL (ref 11.6–15.9)
LYMPH#: 1.8 10*3/uL (ref 0.9–3.3)
LYMPH%: 32.3 % (ref 14.0–49.7)
MCH: 28.8 pg (ref 25.1–34.0)
MCHC: 33.3 g/dL (ref 31.5–36.0)
MCV: 86.5 fL (ref 79.5–101.0)
MONO#: 0.6 10*3/uL (ref 0.1–0.9)
MONO%: 11.4 % (ref 0.0–14.0)
NEUT%: 52.4 % (ref 38.4–76.8)
NEUTROS ABS: 2.9 10*3/uL (ref 1.5–6.5)
PLATELETS: 468 10*3/uL — AB (ref 145–400)
RBC: 4.17 10*6/uL (ref 3.70–5.45)
RDW: 14 % (ref 11.2–14.5)
WBC: 5.4 10*3/uL (ref 3.9–10.3)

## 2017-06-10 LAB — COMPREHENSIVE METABOLIC PANEL
ALT: 23 U/L (ref 0–55)
ANION GAP: 13 meq/L — AB (ref 3–11)
AST: 21 U/L (ref 5–34)
Albumin: 3.8 g/dL (ref 3.5–5.0)
Alkaline Phosphatase: 122 U/L (ref 40–150)
BILIRUBIN TOTAL: 0.71 mg/dL (ref 0.20–1.20)
BUN: 14.5 mg/dL (ref 7.0–26.0)
CALCIUM: 10 mg/dL (ref 8.4–10.4)
CO2: 24 meq/L (ref 22–29)
CREATININE: 1.1 mg/dL (ref 0.6–1.1)
Chloride: 105 mEq/L (ref 98–109)
EGFR: 59 mL/min/{1.73_m2} — AB (ref >90)
Glucose: 114 mg/dl (ref 70–140)
Potassium: 4.2 mEq/L (ref 3.5–5.1)
Sodium: 141 mEq/L (ref 136–145)
TOTAL PROTEIN: 7.6 g/dL (ref 6.4–8.3)

## 2017-06-10 MED ORDER — SODIUM CHLORIDE 0.9% FLUSH
10.0000 mL | INTRAVENOUS | Status: DC | PRN
  Administered 2017-06-10: 10 mL
  Filled 2017-06-10: qty 10

## 2017-06-10 MED ORDER — PALONOSETRON HCL INJECTION 0.25 MG/5ML
INTRAVENOUS | Status: AC
  Filled 2017-06-10: qty 5

## 2017-06-10 MED ORDER — METHOTREXATE SODIUM (PF) CHEMO INJECTION 250 MG/10ML
40.0000 mg/m2 | Freq: Once | INTRAMUSCULAR | Status: AC
Start: 2017-06-10 — End: 2017-06-10
  Administered 2017-06-10: 82 mg via INTRAVENOUS
  Filled 2017-06-10: qty 3.28

## 2017-06-10 MED ORDER — FLUOROURACIL CHEMO INJECTION 2.5 GM/50ML
600.0000 mg/m2 | Freq: Once | INTRAVENOUS | Status: AC
Start: 2017-06-10 — End: 2017-06-10
  Administered 2017-06-10: 1250 mg via INTRAVENOUS
  Filled 2017-06-10: qty 25

## 2017-06-10 MED ORDER — DEXAMETHASONE SODIUM PHOSPHATE 10 MG/ML IJ SOLN
INTRAMUSCULAR | Status: AC
  Filled 2017-06-10: qty 1

## 2017-06-10 MED ORDER — SODIUM CHLORIDE 0.9 % IV SOLN
Freq: Once | INTRAVENOUS | Status: AC
  Administered 2017-06-10: 11:00:00 via INTRAVENOUS

## 2017-06-10 MED ORDER — DEXAMETHASONE SODIUM PHOSPHATE 10 MG/ML IJ SOLN
10.0000 mg | Freq: Once | INTRAMUSCULAR | Status: AC
  Administered 2017-06-10: 10 mg via INTRAVENOUS

## 2017-06-10 MED ORDER — SODIUM CHLORIDE 0.9 % IV SOLN
600.0000 mg/m2 | Freq: Once | INTRAVENOUS | Status: AC
  Administered 2017-06-10: 1240 mg via INTRAVENOUS
  Filled 2017-06-10: qty 62

## 2017-06-10 MED ORDER — PALONOSETRON HCL INJECTION 0.25 MG/5ML
0.2500 mg | Freq: Once | INTRAVENOUS | Status: AC
  Administered 2017-06-10: 0.25 mg via INTRAVENOUS

## 2017-06-10 MED ORDER — HEPARIN SOD (PORK) LOCK FLUSH 100 UNIT/ML IV SOLN
500.0000 [IU] | Freq: Once | INTRAVENOUS | Status: AC | PRN
  Administered 2017-06-10: 500 [IU]
  Filled 2017-06-10: qty 5

## 2017-06-10 NOTE — Patient Instructions (Addendum)
[facility] Discharge Instructions for Patients Receiving Chemotherapy  Today you received the following chemotherapy agents Cytoxan, Methotrexate, Adrucil  To help prevent nausea and vomiting after your treatment, we encourage you to take your nausea medication   If you develop nausea and vomiting that is not controlled by your nausea medication, call the clinic.   BELOW ARE SYMPTOMS THAT SHOULD BE REPORTED IMMEDIATELY:  *FEVER GREATER THAN 100.5 F  *CHILLS WITH OR WITHOUT FEVER  NAUSEA AND VOMITING THAT IS NOT CONTROLLED WITH YOUR NAUSEA MEDICATION  *UNUSUAL SHORTNESS OF BREATH  *UNUSUAL BRUISING OR BLEEDING  TENDERNESS IN MOUTH AND THROAT WITH OR WITHOUT PRESENCE OF ULCERS  *URINARY PROBLEMS  *BOWEL PROBLEMS  UNUSUAL RASH Items with * indicate a potential emergency and should be followed up as soon as possible.  Feel free to call the clinic you have any questions or concerns. The clinic phone number is [phone].  Please show the CHEMO ALERT CARD at check-in to the Emergency Department and triage nurse. 

## 2017-06-10 NOTE — Progress Notes (Signed)
[place]  [phone] [phone]     ID: Cheryl [name] DOB: 12-29-1975  MR#: [id number]  HRC#:[id number]  Patient Care Team: [facility], [facility] as PCP - General (Family Medicine) [name], MD as Consulting Physician (Obstetrics and Gynecology) [name], MD as Consulting Physician (General Surgery) Magrinat, Virgie Dad, MD as Consulting Physician (Oncology) [name], RN as Registered Nurse [name], RN as Registered Nurse Dillingham, [name], DO as Attending Physician (Plastic Surgery) PCP: [facility], [facility] OTHER MD:  CHIEF COMPLAINT: Recurrent triple negative breast cancer  CURRENT TREATMENT: Adjuvant chemotherapy   INTERVAL HISTORY:  Cheryl [name] returns today for follow-up and treatment of her recurrent triple negative breast cancer. Today is day 1 cycle 2 of 8 planned cycles of CMF chemotherapy. [name] doing well today.  She has noted twice in the past 6 months she will wake up in the middle of the night with Right upper quadrant pain with associated nausea.  [name] concerned that she may be having cholecystitis.  She denies any other issues today.       REVIEW OF SYSTEMS: Cheryl [name] denies fever, chills, nausea, vomiting, constipation, diarrhea, mucositis, neuropathy, pain, or any other concerns today.     HISTORY OF RECURRENT DISEASE: From my 03/18/2017 note:  Cheryl [name] returns today for follow-up of her triple negative breast cancer, which is now recurrent. She had routine bilateral diagnostic mammography at the [facility] 03/10/2017 and this showed the breast density to be category C. The old lumpectomy site, which was in the upper outer quadrant, appears stable. However there was a new area of asymmetry in the lower inner quadrant of the left breast. Ultrasound confirmed this to be a 1.0 cm mass at the 7:00 radiant 5 cm from the nipple. Aspiration was attempted but  no fluid was aspirated and biopsy of this mass was obtained 03/05/2017. This showed ([id number]) invasive mammary carcinoma, grade 2, estrogen and progesterone receptor negative, HER-2 not amplified with a signals ratio 1.17 and a number per cell of 2.45, with an MIB-1 of 15%.  [name] here today to discuss subsequent management   BREAST CANCER HISTORY: From the original intake note:  The patient had screening mammography 02/01/2015 which showed a calcifications in the upper outer quadrant of the left breast. On 02/08/2015 she underwent left diagnostic mammography with tomosynthesis and left breast ultrasonography at the [facility] Center. The breast density was category C. Mammography confirmed a group of pleomorphic calcifications spanning 4.2 cm maximally. Physical examination of the upper outer quadrant of the left breast showed an area of thickening at the approximate 2:00 position. Ultrasound of this area showed no discrete masses. There was vague shadowing present. Calcifications could not be clearly identified sonographically. There was no lymphadenopathy in the left axilla.  On the same day the patient underwent biopsy of the left breast area in question, with the pathology (S [id number]) showing ductal carcinoma in situ, grade 2 or 3, with possible areas of microinvasion, the cancer cells being strongly estrogen and progesterone receptor positive, both at 100%.  On 02/15/2015 the patient underwent bilateral breast MRI. This showed the area of malignancy in the upper outer quadrant to measure 4.5 cm, including a large area of non-masslike enhancement and a 1 cm spiculated mass. There was also an indeterminate oval mass in the central left breast and another indeterminant mass in the slightly outer right breast anteriorly. Biopsy of the right breast mass 02/18/2015 (  SAA [id number]) showed a fibroadenoma, with no evidence of malignancy. Biopsy of 2 additional areas of the left breast (at 10:00 and 2:00)  showed a fibroadenoma in the upper inner quadrant, and pseudo-angiomatous stromal hyperplasia (PA SH) at the 2:00 area of the left breast.  The patient's subsequent history is as detailed below.   PAST MEDICAL HISTORY: Past Medical History:  Diagnosis Date  . Breast cancer ([place])    left breast  . Wears contact lenses     PAST SURGICAL HISTORY: Past Surgical History:  Procedure Laterality Date  . BILATERAL TOTAL MASTECTOMY WITH AXILLARY LYMPH NODE DISSECTION  04/20/2017  . BREAST BIOPSY Left 02/18/2015  . BREAST BIOPSY Right 02/18/2015  . BREAST EXCISIONAL BIOPSY Left 04/05/2015  . BREAST LUMPECTOMY Left 03/27/2015  . BREAST LUMPECTOMY WITH NEEDLE LOCALIZATION AND AXILLARY SENTINEL LYMPH NODE BX Left 03/27/2015   Procedure: LEFT BREAST LUMPECTOMY WITH BRACKETED NEEDLE LOCALIZATION AND LEFT  AXILLARY SENTINEL LYMPH NODE BX;  Surgeon: [name], MD;  Location: [place];  Service: General;  Laterality: Left;  . BREAST RECONSTRUCTION WITH PLACEMENT OF TISSUE EXPANDER AND FLEX HD (ACELLULAR HYDRATED DERMIS) Bilateral 04/20/2017   Procedure: BILATARAL BREAST RECONSTRUCTION WITH PLACEMENT OF TISSUE EXPANDER AND FLEX HD (ACELLULAR HYDRATED DERMIS);  Surgeon: Wallace Going, DO;  Location: [place];  Service: Plastics;  Laterality: Bilateral;  . BREAST SURGERY    . MASTECTOMY W/ SENTINEL NODE BIOPSY Bilateral 04/20/2017   Procedure: BILATERAL TOTAL MASTECTOMY WITH LEFT AXILLARY SENTINEL LYMPH NODE BIOPSY;  Surgeon: [name], MD;  Location: [place];  Service: General;  Laterality: Bilateral;  . PORTACATH PLACEMENT Right 04/25/2015   Procedure: INSERTION PORT-A-CATH;  Surgeon: [name], MD;  Location: [place];  Service: General;  Laterality: Right;  . PORTACATH PLACEMENT Right 04/20/2017   Procedure: INSERTION PORT-A-CATH;  Surgeon: [name], MD;  Location: [place];  Service: General;  Laterality: Right;  . RE-EXCISION OF BREAST  LUMPECTOMY Left 04/05/2015   Procedure: RE-EXCISION LEFT  BREAST LUMPECTOMY;  Surgeon: [name], MD;  Location: [place];  Service: General;  Laterality: Left;    FAMILY HISTORY Family History  Problem Relation Age of Onset  . Lung cancer Maternal Grandmother 37       non smoker  . Lung cancer Maternal Grandfather 11       non smoker worked in [employer]  . Cancer Paternal Grandfather 35       throat cancer ? smoker  . Cancer Cousin 41       throat cancer ? smoker   the patient's parents are living, in their mid-50s as of March 2016. The patient had no siblings. Both the patient's mother's parents died from lung cancer although they did not smoke. [place] and her maternal grandfather was a Ecologist. There is no history of breast or ovarian cancer in the family to her knowledge  GYNECOLOGIC HISTORY:  No LMP recorded. Patient has had an implant. Menarche age 39, first live birth age 43. The patient is GX P3. [name] having regular periods. She uses the Essure device for contraception.  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 Her husband [name] is a Freight forwarder. Their daughter [name] lives in [place] where she works in Press photographer. Daughter Summer is a Research scientist (physical sciences) and lives with the patient.. Daughter [name] also at home is 31 years old.    ADVANCED DIRECTIVES: Not in place  HEALTH MAINTENANCE: Social History  Substance Use Topics  . Smoking status: Never Smoker  . Smokeless tobacco: Never Used  . Alcohol use 0.0 oz/week     Comment: occ     Colonoscopy:  PAP:  Bone density:  Lipid panel:  Allergies  Allergen Reactions  . Adhesive [Tape] Other (See Comments)    Skin blisters    Current Outpatient Prescriptions  Medication Sig Dispense Refill  . Ascorbic Acid (VITAMIN C) 1000 MG tablet Take 1,000 mg by mouth daily.    Marland Kitchen dexamethasone (DECADRON) 4 MG tablet Take 1 tablet twice a day with  meals beginning the day after chemotherapy and continuing for 3 days 50 tablet 1  . ibuprofen (ADVIL,MOTRIN) 200 MG tablet Take 400 mg by mouth every 8 (eight) hours as needed for mild pain.    Marland Kitchen lidocaine-prilocaine (EMLA) cream Apply to affected area once 30 g 3  . prochlorperazine (COMPAZINE) 10 MG tablet Take 1 tablet (10 mg total) by mouth every 6 (six) hours as needed (Nausea or vomiting). 30 tablet 1  . venlafaxine XR (EFFEXOR-XR) 37.5 MG 24 hr capsule Take 1 capsule (37.5 mg total) by mouth daily with breakfast. 90 capsule 3   No current facility-administered medications for this visit.     OBJECTIVE:    Vitals:   06/10/17 0953  BP: (!) 132/95  Pulse: 89  Resp: 18  Temp: 98.4 F (36.9 C)     Body mass index is 38.46 kg/m.    ECOG FS:1 - Symptomatic but completely ambulatory  GENERAL: Patient is a well appearing female in no acute distress HEENT:  Sclerae anicteric.  PERRL. Oropharynx clear and moist. No ulcerations or evidence of oropharyngeal candidiasis. Neck is supple.  NODES:  No cervical, supraclavicular, or axillary lymphadenopathy palpated.  BREAST EXAM:  Deferred. LUNGS:  Clear to auscultation bilaterally.  No wheezes or rhonchi. HEART:  Regular rate and rhythm. No murmur appreciated. ABDOMEN:  Soft, nontender.  Positive, normoactive bowel sounds. No organomegaly palpated. MSK:  No focal spinal tenderness to palpation. Full range of motion bilaterally in the upper extremities. EXTREMITIES:  No peripheral edema.   SKIN:  Clear with no obvious rashes or skin changes. No nail dyscrasia. NEURO:  Nonfocal. Well oriented.  Appropriate affect.    LAB RESULTS:  CMP     Component Value Date/Time   NA 141 06/10/2017 0939   K 4.2 06/10/2017 0939   CL 105 04/21/2017 0419   CO2 24 06/10/2017 0939   GLUCOSE 114 06/10/2017 0939   BUN 14.5 06/10/2017 0939   CREATININE 1.1 06/10/2017 0939   CALCIUM 10.0 06/10/2017 0939   PROT 7.6 06/10/2017 0939   ALBUMIN 3.8  06/10/2017 0939   AST 21 06/10/2017 0939   ALT 23 06/10/2017 0939   ALKPHOS 122 06/10/2017 0939   BILITOT 0.71 06/10/2017 0939   GFRNONAA >60 04/21/2017 0419   GFRAA >60 04/21/2017 0419    INo results found for: SPEP, UPEP  Lab Results  Component Value Date   WBC 5.4 06/10/2017   NEUTROABS 2.9 06/10/2017   HGB 12.0 06/10/2017   HCT 36.1 06/10/2017   MCV 86.5 06/10/2017   PLT 468 (H) 06/10/2017      Chemistry      Component Value Date/Time   NA 141 06/10/2017 0939   K 4.2 06/10/2017 0939   CL 105 04/21/2017 0419   CO2 24 06/10/2017 0939   BUN 14.5 06/10/2017 0939   CREATININE 1.1 06/10/2017 5188  Component Value Date/Time   CALCIUM 10.0 06/10/2017 0939   ALKPHOS 122 06/10/2017 0939   AST 21 06/10/2017 0939   ALT 23 06/10/2017 0939   BILITOT 0.71 06/10/2017 0939       No results found for: LABCA2  No components found for: LABCA125  No results for input(s): INR in the last 168 hours.  Urinalysis    Component Value Date/Time   LABSPEC 1.020 05/30/2015 1502   PHURINE 6.0 05/30/2015 1502   GLUCOSEU Negative 05/30/2015 1502   HGBUR Small 05/30/2015 1502   BILIRUBINUR Negative 05/30/2015 1502   KETONESUR Negative 05/30/2015 1502   PROTEINUR Negative 05/30/2015 1502   UROBILINOGEN 0.2 05/30/2015 1502   NITRITE Negative 05/30/2015 1502   LEUKOCYTESUR Negative 05/30/2015 1502    STUDIES: No results found.  ASSESSMENT: 41 y.o. [place], Cheryl [name] woman status post left breast upper outer quadrant biopsy 02/08/2015 for ductal carcinoma in situ, grade 2 or 3, strongly estrogen and progesterone receptor positive, with likely areas of microinvasion  (a) biopsy of an area in the left breast upper outer quadrant showed PASH  (b) biopsy of 2 additional questionable areas, one in each breast, showed bilateral fibroadenomas  (1) genetics testing March 2016 through the BreastNext gene panel offered by [employer] showed no deleterious mutations in ATM, BARD1, BRCA1,  BRCA2, BRIP1, CDH1, CHEK2, MRE11A, MUTYH, NBN, NF1, PALB2, PTEN, RAD50, RAD51C, RAD51D, and TP53.  (2) status post left lumpectomy with sentinel lymph node sampling 03/27/2015 for a pT1c pN0, stage IA invasive ductal carcinoma, grade 3, triple negative, with an MIB-1 of 33%  (a) close margins were cleared with subsequent excision 04/05/2015.  (3) Oncotype DX score of 38 predicts a risk of 26% outside the breast recurrence within 10 years if the patient's only systemic treatment is tamoxifen for 5 years  (4) adjuvant chemotherapy started 05/02/2015 consisting of doxorubicin and cyclophosphamide in dose dense fashion 4, completed 06/13/2015, followed by paclitaxel weekly 12.   (a) Paclitaxel stopped after only 2 cycles because of persistent neuropathy.  (5) adjuvant radiation completed 11/08  (5) tamoxifen started 02/20/2015 (neoadjuvantly), discontinued 04/19/2015 so as not to overlap chemotherapy; resumed 02/07/2016, discontinued April 2018 with disease recurrence  RECURRENT DISEASE: April 2018 (6) left breast lower outer quadrant biopsy 03/05/2017 shows invasive ductal carcinoma, grade 2, triple negative  (7) status post bilateral mastectomies and left axillary lymph node dissection 04/20/2017 showing a residual left pT2 pN0 (8 nodes removed) invasive ductal carcinoma, grade 3, with negative margins; the right breast was benign  (8) chemotherapy for her recurrence will consist of cyclophosphamide, methotrexate and fluorouracil (CMF) given every 21 days 8, beginning 05/13/2017  PLAN Cheryl [name] is doing well today.  She will proceed with her chemotherapy.  Her CBC is stable and I reviewed this with her in detail.  Her CMP was not back at the time of her appointment or at the completion of her appointment with me, those results remain pending.  She has her nausea medications and will take it as recommended by Dr. Jana Hakim last week.  I reviewed with Dr. Jana Hakim her RUQ pain.  We will send her  back to Dr. [name] to eval whether or not she will need cholecystectomy.  She and I discussed briefly the possibility of a HIDA scan being done.  She verbalized understanding of the above plan.    Cheryl [name] will return on August 9 for labs, follow up with Dr. Jana Hakim, and cycle 3 of CMF.     Scot Dock, NP  06/10/2017 10:19 AM

## 2017-06-17 ENCOUNTER — Encounter: Payer: Self-pay | Admitting: Oncology

## 2017-06-25 ENCOUNTER — Other Ambulatory Visit: Payer: Self-pay

## 2017-06-28 ENCOUNTER — Encounter: Payer: Self-pay | Admitting: Adult Health

## 2017-06-29 ENCOUNTER — Encounter (HOSPITAL_COMMUNITY): Payer: Self-pay | Admitting: Emergency Medicine

## 2017-06-29 ENCOUNTER — Telehealth: Payer: Self-pay | Admitting: Oncology

## 2017-06-29 ENCOUNTER — Emergency Department (HOSPITAL_COMMUNITY): Payer: Federal, State, Local not specified - PPO

## 2017-06-29 ENCOUNTER — Emergency Department (HOSPITAL_COMMUNITY)
Admission: EM | Admit: 2017-06-29 | Discharge: 2017-06-29 | Disposition: A | Discharge status: Home / Self Care | Payer: Federal, State, Local not specified - PPO | Attending: Emergency Medicine | Admitting: Emergency Medicine

## 2017-06-29 DIAGNOSIS — R1011 Right upper quadrant pain: Secondary | ICD-10-CM | POA: Insufficient documentation

## 2017-06-29 DIAGNOSIS — Z853 Personal history of malignant neoplasm of breast: Secondary | ICD-10-CM | POA: Insufficient documentation

## 2017-06-29 DIAGNOSIS — R109 Unspecified abdominal pain: Secondary | ICD-10-CM

## 2017-06-29 DIAGNOSIS — K760 Fatty (change of) liver, not elsewhere classified: Secondary | ICD-10-CM | POA: Diagnosis not present

## 2017-06-29 DIAGNOSIS — K8020 Calculus of gallbladder without cholecystitis without obstruction: Secondary | ICD-10-CM

## 2017-06-29 DIAGNOSIS — R1010 Upper abdominal pain, unspecified: Secondary | ICD-10-CM | POA: Diagnosis not present

## 2017-06-29 LAB — COMPREHENSIVE METABOLIC PANEL
ALBUMIN: 3.7 g/dL (ref 3.5–5.0)
ALK PHOS: 103 U/L (ref 38–126)
ALT: 47 U/L (ref 14–54)
AST: 37 U/L (ref 15–41)
Anion gap: 11 (ref 5–15)
BUN: 9 mg/dL (ref 6–20)
CALCIUM: 9.5 mg/dL (ref 8.9–10.3)
CHLORIDE: 102 mmol/L (ref 101–111)
CO2: 24 mmol/L (ref 22–32)
CREATININE: 0.92 mg/dL (ref 0.44–1.00)
GFR calc non Af Amer: >60 mL/min (ref >60)
GLUCOSE: 144 mg/dL — AB (ref 65–99)
Potassium: 4 mmol/L (ref 3.5–5.1)
SODIUM: 137 mmol/L (ref 135–145)
Total Bilirubin: 0.8 mg/dL (ref 0.3–1.2)
Total Protein: 6.6 g/dL (ref 6.5–8.1)

## 2017-06-29 LAB — CBC
HCT: 34.5 % — ABNORMAL LOW (ref 36.0–46.0)
Hemoglobin: 11.3 g/dL — ABNORMAL LOW (ref 12.0–15.0)
MCH: 28.4 pg (ref 26.0–34.0)
MCHC: 32.8 g/dL (ref 30.0–36.0)
MCV: 86.7 fL (ref 78.0–100.0)
PLATELETS: 369 10*3/uL (ref 150–400)
RBC: 3.98 MIL/uL (ref 3.87–5.11)
RDW: 14.2 % (ref 11.5–15.5)
WBC: 5.1 10*3/uL (ref 4.0–10.5)

## 2017-06-29 LAB — URINALYSIS, ROUTINE W REFLEX MICROSCOPIC
Bacteria, UA: NONE SEEN
Bilirubin Urine: NEGATIVE
GLUCOSE, UA: NEGATIVE mg/dL
Ketones, ur: NEGATIVE mg/dL
Leukocytes, UA: NEGATIVE
Nitrite: NEGATIVE
PROTEIN: NEGATIVE mg/dL
SPECIFIC GRAVITY, URINE: 1.012 (ref 1.005–1.030)
pH: 6 (ref 5.0–8.0)

## 2017-06-29 LAB — HCG, QUANTITATIVE, PREGNANCY: HCG, BETA CHAIN, QUANT, S: 6 m[IU]/mL — AB (ref <5)

## 2017-06-29 LAB — LIPASE, BLOOD: LIPASE: 32 U/L (ref 11–51)

## 2017-06-29 MED ORDER — ONDANSETRON 4 MG PO TBDP
4.0000 mg | ORAL_TABLET | Freq: Once | ORAL | Status: AC | PRN
  Administered 2017-06-29: 4 mg via ORAL

## 2017-06-29 MED ORDER — ONDANSETRON 4 MG PO TBDP
ORAL_TABLET | ORAL | Status: AC
  Filled 2017-06-29: qty 1

## 2017-06-29 MED ORDER — MORPHINE SULFATE (PF) 4 MG/ML IV SOLN
4.0000 mg | Freq: Once | INTRAVENOUS | Status: AC
  Administered 2017-06-29: 4 mg via INTRAVENOUS
  Filled 2017-06-29: qty 1

## 2017-06-29 MED ORDER — OXYCODONE-ACETAMINOPHEN 5-325 MG PO TABS: 1.0000 | ORAL_TABLET | ORAL | 0 refills | Status: DC | PRN

## 2017-06-29 MED ORDER — ONDANSETRON HCL 4 MG/2ML IJ SOLN
4.0000 mg | Freq: Once | INTRAMUSCULAR | Status: AC
  Administered 2017-06-29: 4 mg via INTRAVENOUS
  Filled 2017-06-29: qty 2

## 2017-06-29 NOTE — Telephone Encounter (Signed)
sw pt to confirm r/s appt to 8/15 at 11 am per sch msg

## 2017-06-29 NOTE — ED Notes (Signed)
Patient transported to Ultrasound 

## 2017-06-29 NOTE — ED Triage Notes (Signed)
Pt woke up w/ right sided abdominal pain this morning. Reports Nausea and Emesis.  Pt is currently a cancer pt at [facility].

## 2017-06-29 NOTE — ED Provider Notes (Signed)
Pt signed out from Dr. Dina Rich.  Patient was waiting for surgery to see.  Surgery thought pt could go home and f/u with Dr. [name].  The pt's pain is improved and she is good with d/c.  She knows to return if worse and to f/u with surgery.   Cheryl Pence, MD 06/29/17 [phone]

## 2017-06-29 NOTE — ED Provider Notes (Signed)
[facility] DEPT Provider Note   CSN: [id number] Arrival date & time: 06/29/17  0117     History   Chief Complaint Chief Complaint  Patient presents with  . Abdominal Pain    HPI Cheryl [name] is a 41 y.o. female.  HPI  This is a 40 year old female with a history of breast cancer currently undergoing chemotherapy who presents with abdominal pain. Patient reports sharp right-sided abdominal pain. Onset of symptoms was midnight last night. She reports one episode of nonbloody emesis. Currently her pain is 4 out of 10; however, she states originally the pain woke her up from her sleep. She reports that she has had 2 other similar episodes of pain which is self resolved. Unsure whether it is exacerbated by food. She did note on one of her cancer scans that she has gallstones and thinks this may be the source of her pain. She denies any fevers or dysuria.  Past Medical History:  Diagnosis Date  . Breast cancer ([place])    left breast  . Wears contact lenses     Patient Active Problem List   Diagnosis Date Noted  . Recurrent breast cancer, left ([place]) 03/18/2017  . Genetic testing 03/05/2016  . Chemotherapy-induced neuropathy ([place]) 07/11/2015  . Bronchitis 06/13/2015  . Flank pain 05/30/2015  . UTI (urinary tract infection) 05/16/2015  . Malignant neoplasm of upper-outer quadrant of left breast in female, estrogen receptor negative ([place]) 02/13/2015    Past Surgical History:  Procedure Laterality Date  . BILATERAL TOTAL MASTECTOMY WITH AXILLARY LYMPH NODE DISSECTION  04/20/2017  . BREAST BIOPSY Left 02/18/2015  . BREAST BIOPSY Right 02/18/2015  . BREAST EXCISIONAL BIOPSY Left 04/05/2015  . BREAST LUMPECTOMY Left 03/27/2015  . BREAST LUMPECTOMY WITH NEEDLE LOCALIZATION AND AXILLARY SENTINEL LYMPH NODE BX Left 03/27/2015   Procedure: LEFT BREAST LUMPECTOMY WITH BRACKETED NEEDLE LOCALIZATION AND LEFT  AXILLARY SENTINEL LYMPH NODE BX;  Surgeon: [name], MD;  Location:  [place];  Service: General;  Laterality: Left;  . BREAST RECONSTRUCTION WITH PLACEMENT OF TISSUE EXPANDER AND FLEX HD (ACELLULAR HYDRATED DERMIS) Bilateral 04/20/2017   Procedure: BILATARAL BREAST RECONSTRUCTION WITH PLACEMENT OF TISSUE EXPANDER AND FLEX HD (ACELLULAR HYDRATED DERMIS);  Surgeon: Wallace Going, DO;  Location: [place];  Service: Plastics;  Laterality: Bilateral;  . BREAST SURGERY    . MASTECTOMY W/ SENTINEL NODE BIOPSY Bilateral 04/20/2017   Procedure: BILATERAL TOTAL MASTECTOMY WITH LEFT AXILLARY SENTINEL LYMPH NODE BIOPSY;  Surgeon: [name], MD;  Location: [place];  Service: General;  Laterality: Bilateral;  . PORTACATH PLACEMENT Right 04/25/2015   Procedure: INSERTION PORT-A-CATH;  Surgeon: [name], MD;  Location: [place];  Service: General;  Laterality: Right;  . PORTACATH PLACEMENT Right 04/20/2017   Procedure: INSERTION PORT-A-CATH;  Surgeon: [name], MD;  Location: [place];  Service: General;  Laterality: Right;  . RE-EXCISION OF BREAST LUMPECTOMY Left 04/05/2015   Procedure: RE-EXCISION LEFT  BREAST LUMPECTOMY;  Surgeon: [name], MD;  Location: [place];  Service: General;  Laterality: Left;    OB History    No data available       Home Medications    Prior to Admission medications   Medication Sig Start Date End Date Taking? Authorizing Provider  Ascorbic Acid (VITAMIN C) 1000 MG tablet Take 1,000 mg by mouth daily.    [provider]  dexamethasone (DECADRON) 4 MG tablet Take 1 tablet twice a day with meals beginning the day after chemotherapy  and continuing for 3 days 05/28/17   Magrinat, Virgie Dad, MD  ibuprofen (ADVIL,MOTRIN) 200 MG tablet Take 400 mg by mouth every 8 (eight) hours as needed for mild pain.    [provider]  lidocaine-prilocaine (EMLA) cream Apply to affected area once 05/07/17   Magrinat, Virgie Dad, MD  prochlorperazine (COMPAZINE) 10 MG  tablet Take 1 tablet (10 mg total) by mouth every 6 (six) hours as needed (Nausea or vomiting). 05/07/17   Magrinat, Virgie Dad, MD  venlafaxine XR (EFFEXOR-XR) 37.5 MG 24 hr capsule Take 1 capsule (37.5 mg total) by mouth daily with breakfast. 05/23/16   Magrinat, Virgie Dad, MD    Family History Family History  Problem Relation Age of Onset  . Lung cancer Maternal Grandmother 35       non smoker  . Lung cancer Maternal Grandfather 72       non smoker worked in [employer]  . Cancer Paternal Grandfather 35       throat cancer ? smoker  . Cancer Cousin 41       throat cancer ? smoker    Social History Social History  Substance Use Topics  . Smoking status: Never Smoker  . Smokeless tobacco: Never Used  . Alcohol use 0.0 oz/week     Comment: occ     Allergies   Adhesive [tape]   Review of Systems Review of Systems  Constitutional: Negative for fever.  Respiratory: Negative for shortness of breath.   Cardiovascular: Negative for chest pain.  Gastrointestinal: Positive for abdominal pain, nausea and vomiting. Negative for diarrhea.  Genitourinary: Negative for dysuria.  All other systems reviewed and are negative.    Physical Exam Updated Vital Signs BP (!) 158/97 (BP Location: Right Arm)   Pulse 82   Temp 98.3 F (36.8 C) (Oral)   Resp 20   SpO2 99%   Physical Exam  Constitutional: [name] oriented to person, place, and time. She appears well-developed and well-nourished.  Overweight  HENT:  Head: Normocephalic and atraumatic.  Cardiovascular: Normal rate, regular rhythm and normal heart sounds.   No murmur heard. Pulmonary/Chest: Effort normal and breath sounds normal. No respiratory distress. She has no wheezes.  Bilateral mastectomy  Abdominal: Soft. Bowel sounds are normal. There is tenderness. There is no rebound and no guarding.  Right mid and upper abdominal tenderness to palpation, no rebound or guarding  Neurological: [name] alert and oriented to person,  place, and time.  Skin: Skin is warm and dry.  Psychiatric: She has a normal mood and affect.  Nursing note and vitals reviewed.    ED Treatments / Results  Labs (all labs ordered are listed, but only abnormal results are displayed) Labs Reviewed  COMPREHENSIVE METABOLIC PANEL - Abnormal; Notable for the following:       Result Value   Glucose, Bld 144 (*)    All other components within normal limits  CBC - Abnormal; Notable for the following:    Hemoglobin 11.3 (*)    HCT 34.5 (*)    All other components within normal limits  URINALYSIS, ROUTINE W REFLEX MICROSCOPIC - Abnormal; Notable for the following:    Hgb urine dipstick SMALL (*)    Squamous Epithelial / LPF 0-5 (*)    All other components within normal limits  HCG, QUANTITATIVE, PREGNANCY - Abnormal; Notable for the following:    hCG, Beta Chain, Quant, S 6 (*)    All other components within normal limits  LIPASE, BLOOD  EKG  EKG Interpretation None       Radiology No results found.  Procedures Procedures (including critical care time)  Medications Ordered in ED Medications  ondansetron (ZOFRAN-ODT) 4 MG disintegrating tablet (not administered)  ondansetron (ZOFRAN-ODT) disintegrating tablet 4 mg (4 mg Oral Given 06/29/17 0141)  morphine 4 MG/ML injection 4 mg (4 mg Intravenous Given 06/29/17 0607)  ondansetron (ZOFRAN) injection 4 mg (4 mg Intravenous Given 06/29/17 0608)     Initial Impression / Assessment and Plan / ED Course  I have reviewed the triage vital signs and the nursing notes.  Pertinent labs & imaging results that were available during my care of the patient were reviewed by me and considered in my medical decision making (see chart for details).     Patient presents with abdominal pain. She has had 2 separate episodes previously of similar pain. Right upper quadrant. It is somewhat suspicious for biliary colic. [name] nontoxic. Afebrile. Lab work reviewed. No leukocytosis. LFTs normal.  Patient given pain and nausea medication. Right upper quadrant ultrasound obtained.  7:51 AM Patient somewhat improved after pain and nausea medication. Ultrasound has gallbladder wall thickening and cholelithiasis suggestive of cholecystitis. Will consult general surgery. Overall patient is well-appearing.  Final Clinical Impressions(s) / ED Diagnoses   Final diagnoses:  Abdominal pain    New Prescriptions New Prescriptions   No medications on file     Merryl Hacker, MD 06/29/17 [phone]

## 2017-07-01 ENCOUNTER — Other Ambulatory Visit: Payer: 59

## 2017-07-01 ENCOUNTER — Ambulatory Visit: Payer: 59 | Admitting: Hematology and Oncology

## 2017-07-01 ENCOUNTER — Ambulatory Visit: Payer: 59

## 2017-07-01 DIAGNOSIS — K8010 Calculus of gallbladder with chronic cholecystitis without obstruction: Secondary | ICD-10-CM | POA: Diagnosis not present

## 2017-07-02 NOTE — Patient Instructions (Addendum)
Cheryl [name]  07/02/2017   Your procedure is scheduled on: 07-07-17   Report to [facility] Main  Entrance Take [place] Elevators to 3rd floor to  [place] at 6:30 AM.   Call this number if you have problems the morning of surgery [phone]   Remember: ONLY 1 PERSON MAY GO WITH YOU TO SHORT STAY TO GET  READY MORNING OF [place].  Do not eat food or drink liquids :After Midnight.     Take these medicines the morning of surgery with A SIP OF WATER: None                                You may not have any metal on your body including hair pins and              piercings  Do not wear jewelry, make-up, lotions, powders or perfumes, deodorant             Do not wear nail polish.  Do not shave  48 hours prior to surgery.              Do not bring valuables to the hospital. [place].  Contacts, dentures or bridgework may not be worn into surgery.      Patients discharged the day of surgery will not be allowed to drive home.  Name and phone number of your driver:  [name] [phone]               Please read over the following fact sheets you were given: _____________________________________________________________________             [facility] - Preparing for Surgery Before surgery, you can play an important role.  Because skin is not sterile, your skin needs to be as free of germs as possible.  You can reduce the number of germs on your skin by washing with CHG (chlorahexidine gluconate) soap before surgery.  CHG is an antiseptic cleaner which kills germs and bonds with the skin to continue killing germs even after washing. Please DO NOT use if you have an allergy to CHG or antibacterial soaps.  If your skin becomes reddened/irritated stop using the CHG and inform your nurse when you arrive at Short Stay. Do not shave (including legs and underarms) for at least 48 hours prior to the first CHG  shower.  You may shave your face/neck. Please follow these instructions carefully:  1.  Shower with CHG Soap the night before surgery and the  morning of Surgery.  2.  If you choose to wash your hair, wash your hair first as usual with your  normal  shampoo.  3.  After you shampoo, rinse your hair and body thoroughly to remove the  shampoo.                           4.  Use CHG as you would any other liquid soap.  You can apply chg directly  to the skin and wash                       Gently with a scrungie or clean washcloth.  5.  Apply  the CHG Soap to your body ONLY FROM THE NECK DOWN.   Do not use on face/ open                           Wound or open sores. Avoid contact with eyes, ears mouth and genitals (private parts).                       Wash face,  Genitals (private parts) with your normal soap.             6.  Wash thoroughly, paying special attention to the area where your surgery  will be performed.  7.  Thoroughly rinse your body with warm water from the neck down.  8.  DO NOT shower/wash with your normal soap after using and rinsing off  the CHG Soap.                9.  Pat yourself dry with a clean towel.            10.  Wear clean pajamas.            11.  Place clean sheets on your bed the night of your first shower and do not  sleep with pets. Day of Surgery : Do not apply any lotions/deodorants the morning of surgery.  Please wear clean clothes to the hospital/surgery center.  FAILURE TO FOLLOW THESE INSTRUCTIONS MAY RESULT IN THE CANCELLATION OF YOUR SURGERY PATIENT SIGNATURE_________________________________  NURSE SIGNATURE__________________________________  ________________________________________________________________________

## 2017-07-02 NOTE — Progress Notes (Signed)
06-29-17 (EPIC), CBC, CMP, UA, Lipase, hcq Quantitative-abnormal, UA

## 2017-07-05 ENCOUNTER — Ambulatory Visit: Payer: Self-pay | Admitting: General Surgery

## 2017-07-06 ENCOUNTER — Encounter (HOSPITAL_COMMUNITY): Payer: Self-pay

## 2017-07-06 ENCOUNTER — Telehealth: Payer: Self-pay | Admitting: *Deleted

## 2017-07-06 ENCOUNTER — Encounter (HOSPITAL_COMMUNITY)
Admission: RE | Admit: 2017-07-06 | Discharge: 2017-07-06 | Disposition: A | Discharge status: Home / Self Care | Payer: 59 | Source: Ambulatory Visit | Attending: General Surgery | Admitting: General Surgery

## 2017-07-06 DIAGNOSIS — Z791 Long term (current) use of non-steroidal anti-inflammatories (NSAID): Secondary | ICD-10-CM | POA: Diagnosis not present

## 2017-07-06 DIAGNOSIS — Z79891 Long term (current) use of opiate analgesic: Secondary | ICD-10-CM | POA: Diagnosis not present

## 2017-07-06 DIAGNOSIS — Z853 Personal history of malignant neoplasm of breast: Secondary | ICD-10-CM | POA: Diagnosis not present

## 2017-07-06 DIAGNOSIS — Z7952 Long term (current) use of systemic steroids: Secondary | ICD-10-CM | POA: Diagnosis not present

## 2017-07-06 DIAGNOSIS — Z9013 Acquired absence of bilateral breasts and nipples: Secondary | ICD-10-CM | POA: Diagnosis not present

## 2017-07-06 DIAGNOSIS — K8010 Calculus of gallbladder with chronic cholecystitis without obstruction: Secondary | ICD-10-CM | POA: Diagnosis not present

## 2017-07-06 DIAGNOSIS — K8080 Other cholelithiasis without obstruction: Secondary | ICD-10-CM | POA: Diagnosis present

## 2017-07-06 DIAGNOSIS — Z79899 Other long term (current) drug therapy: Secondary | ICD-10-CM | POA: Diagnosis not present

## 2017-07-06 NOTE — Telephone Encounter (Signed)
This RN spoke with pt per call today stating concern with attempts to reach scheduling per need to cancel appointments.  Per phone discussion- Shahira will be proceeding to have her cholecystectomy tomorrow under Dr [name].  Appointments cancelled for lab/md/treatment.  Next scheduled appointment is 07/22/2017 for lab/md/treatment.  MD will be notified of above.  No other needs at this time.

## 2017-07-06 NOTE — Progress Notes (Signed)
07-03-17 HCG, Beta Quant Serum, on chart

## 2017-07-07 ENCOUNTER — Ambulatory Visit: Payer: 59 | Admitting: Oncology

## 2017-07-07 ENCOUNTER — Ambulatory Visit (HOSPITAL_COMMUNITY): Payer: 59 | Admitting: Certified Registered Nurse Anesthetist

## 2017-07-07 ENCOUNTER — Encounter (HOSPITAL_COMMUNITY)
Admission: RE | Disposition: A | Discharge status: Home / Self Care | Payer: Self-pay | Source: Ambulatory Visit | Attending: General Surgery

## 2017-07-07 ENCOUNTER — Other Ambulatory Visit: Payer: 59

## 2017-07-07 ENCOUNTER — Ambulatory Visit (HOSPITAL_COMMUNITY): Payer: 59

## 2017-07-07 ENCOUNTER — Ambulatory Visit (HOSPITAL_COMMUNITY)
Admission: RE | Admit: 2017-07-07 | Discharge: 2017-07-07 | Disposition: A | Discharge status: Home / Self Care | Payer: 59 | Source: Ambulatory Visit | Attending: General Surgery | Admitting: General Surgery

## 2017-07-07 ENCOUNTER — Encounter (HOSPITAL_COMMUNITY): Payer: Self-pay | Admitting: Anesthesiology

## 2017-07-07 ENCOUNTER — Ambulatory Visit: Payer: 59

## 2017-07-07 DIAGNOSIS — Z791 Long term (current) use of non-steroidal anti-inflammatories (NSAID): Secondary | ICD-10-CM | POA: Diagnosis not present

## 2017-07-07 DIAGNOSIS — C50412 Malignant neoplasm of upper-outer quadrant of left female breast: Secondary | ICD-10-CM | POA: Diagnosis not present

## 2017-07-07 DIAGNOSIS — Z419 Encounter for procedure for purposes other than remedying health state, unspecified: Secondary | ICD-10-CM

## 2017-07-07 DIAGNOSIS — K8010 Calculus of gallbladder with chronic cholecystitis without obstruction: Secondary | ICD-10-CM | POA: Insufficient documentation

## 2017-07-07 DIAGNOSIS — Z853 Personal history of malignant neoplasm of breast: Secondary | ICD-10-CM | POA: Insufficient documentation

## 2017-07-07 DIAGNOSIS — Z9013 Acquired absence of bilateral breasts and nipples: Secondary | ICD-10-CM | POA: Insufficient documentation

## 2017-07-07 DIAGNOSIS — K8020 Calculus of gallbladder without cholecystitis without obstruction: Secondary | ICD-10-CM | POA: Diagnosis not present

## 2017-07-07 DIAGNOSIS — Z79899 Other long term (current) drug therapy: Secondary | ICD-10-CM | POA: Insufficient documentation

## 2017-07-07 DIAGNOSIS — Z7952 Long term (current) use of systemic steroids: Secondary | ICD-10-CM | POA: Insufficient documentation

## 2017-07-07 DIAGNOSIS — J40 Bronchitis, not specified as acute or chronic: Secondary | ICD-10-CM | POA: Diagnosis not present

## 2017-07-07 DIAGNOSIS — Z79891 Long term (current) use of opiate analgesic: Secondary | ICD-10-CM | POA: Insufficient documentation

## 2017-07-07 HISTORY — PX: CHOLECYSTECTOMY: SHX55

## 2017-07-07 SURGERY — LAPAROSCOPIC CHOLECYSTECTOMY WITH INTRAOPERATIVE CHOLANGIOGRAM
Anesthesia: General

## 2017-07-07 MED ORDER — IOPAMIDOL (ISOVUE-300) INJECTION 61%
INTRAVENOUS | Status: AC
  Filled 2017-07-07: qty 50

## 2017-07-07 MED ORDER — PROPOFOL 10 MG/ML IV BOLUS
INTRAVENOUS | Status: DC | PRN
  Administered 2017-07-07: 180 mg via INTRAVENOUS

## 2017-07-07 MED ORDER — ROCURONIUM BROMIDE 50 MG/5ML IV SOSY
PREFILLED_SYRINGE | INTRAVENOUS | Status: AC
  Filled 2017-07-07: qty 10

## 2017-07-07 MED ORDER — FENTANYL CITRATE (PF) 250 MCG/5ML IJ SOLN
INTRAMUSCULAR | Status: AC
  Filled 2017-07-07: qty 5

## 2017-07-07 MED ORDER — KETAMINE HCL 10 MG/ML IJ SOLN
INTRAMUSCULAR | Status: DC | PRN
  Administered 2017-07-07: 20 mg via INTRAVENOUS
  Administered 2017-07-07: 50 mg via INTRAVENOUS
  Administered 2017-07-07: 10 mg via INTRAVENOUS

## 2017-07-07 MED ORDER — HYDROMORPHONE HCL 1 MG/ML IJ SOLN
2.0000 mg | Freq: Once | INTRAMUSCULAR | Status: AC
  Administered 2017-07-07: 1 mg via INTRAVENOUS
  Filled 2017-07-07: qty 2

## 2017-07-07 MED ORDER — HYDROMORPHONE HCL-NACL 0.5-0.9 MG/ML-% IV SOSY
PREFILLED_SYRINGE | INTRAVENOUS | Status: AC
  Administered 2017-07-07: 0.5 mg via INTRAVENOUS
  Filled 2017-07-07: qty 2

## 2017-07-07 MED ORDER — PROPOFOL 10 MG/ML IV BOLUS
INTRAVENOUS | Status: AC
  Filled 2017-07-07: qty 40

## 2017-07-07 MED ORDER — CHLORHEXIDINE GLUCONATE CLOTH 2 % EX PADS: 6.0000 | MEDICATED_PAD | Freq: Once | CUTANEOUS | Status: DC

## 2017-07-07 MED ORDER — SODIUM CHLORIDE 0.9 % IV SOLN
INTRAVENOUS | Status: DC | PRN
  Administered 2017-07-07: 80 ug via INTRAVENOUS

## 2017-07-07 MED ORDER — HYDROMORPHONE HCL-NACL 0.5-0.9 MG/ML-% IV SOSY: 2.0000 mg | PREFILLED_SYRINGE | Freq: Once | INTRAVENOUS | Status: DC

## 2017-07-07 MED ORDER — LACTATED RINGERS IV SOLN
INTRAVENOUS | Status: DC | PRN
  Administered 2017-07-07 (×2): via INTRAVENOUS

## 2017-07-07 MED ORDER — ROCURONIUM BROMIDE 50 MG/5ML IV SOSY
PREFILLED_SYRINGE | INTRAVENOUS | Status: AC
  Filled 2017-07-07: qty 5

## 2017-07-07 MED ORDER — SODIUM CHLORIDE 0.9 % IJ SOLN
INTRAMUSCULAR | Status: AC
  Filled 2017-07-07: qty 10

## 2017-07-07 MED ORDER — HYDROMORPHONE HCL-NACL 0.5-0.9 MG/ML-% IV SOSY
0.2500 mg | PREFILLED_SYRINGE | INTRAVENOUS | Status: DC | PRN
  Administered 2017-07-07 (×4): 0.5 mg via INTRAVENOUS

## 2017-07-07 MED ORDER — BUPIVACAINE-EPINEPHRINE (PF) 0.25% -1:200000 IJ SOLN
INTRAMUSCULAR | Status: AC
  Filled 2017-07-07: qty 30

## 2017-07-07 MED ORDER — ROCURONIUM BROMIDE 100 MG/10ML IV SOLN
INTRAVENOUS | Status: DC | PRN
  Administered 2017-07-07: 20 mg via INTRAVENOUS
  Administered 2017-07-07: 50 mg via INTRAVENOUS

## 2017-07-07 MED ORDER — DEXAMETHASONE SODIUM PHOSPHATE 4 MG/ML IJ SOLN
INTRAMUSCULAR | Status: DC | PRN
  Administered 2017-07-07: 10 mg via INTRAVENOUS

## 2017-07-07 MED ORDER — KETAMINE HCL 10 MG/ML IJ SOLN
INTRAMUSCULAR | Status: AC
  Filled 2017-07-07: qty 1

## 2017-07-07 MED ORDER — CEFAZOLIN SODIUM-DEXTROSE 2-4 GM/100ML-% IV SOLN
2.0000 g | INTRAVENOUS | Status: AC
  Administered 2017-07-07: 2 g via INTRAVENOUS

## 2017-07-07 MED ORDER — PROMETHAZINE HCL 25 MG/ML IJ SOLN: 6.2500 mg | INTRAMUSCULAR | Status: DC | PRN

## 2017-07-07 MED ORDER — LACTATED RINGERS IV SOLN
INTRAVENOUS | Status: AC | PRN
  Administered 2017-07-07: 1000 mL

## 2017-07-07 MED ORDER — 0.9 % SODIUM CHLORIDE (POUR BTL) OPTIME
TOPICAL | Status: DC | PRN
  Administered 2017-07-07: 1000 mL

## 2017-07-07 MED ORDER — CEFAZOLIN SODIUM-DEXTROSE 2-4 GM/100ML-% IV SOLN
INTRAVENOUS | Status: AC
  Filled 2017-07-07: qty 100

## 2017-07-07 MED ORDER — ONDANSETRON HCL 4 MG/2ML IJ SOLN
INTRAMUSCULAR | Status: DC | PRN
  Administered 2017-07-07: 4 mg via INTRAVENOUS

## 2017-07-07 MED ORDER — LIDOCAINE HCL (CARDIAC) 20 MG/ML IV SOLN
INTRAVENOUS | Status: DC | PRN
  Administered 2017-07-07: 100 mg via INTRAVENOUS

## 2017-07-07 MED ORDER — OXYCODONE-ACETAMINOPHEN 5-325 MG PO TABS: 1.0000 | ORAL_TABLET | ORAL | 0 refills | Status: DC | PRN

## 2017-07-07 MED ORDER — BUPIVACAINE-EPINEPHRINE 0.25% -1:200000 IJ SOLN
INTRAMUSCULAR | Status: DC | PRN
  Administered 2017-07-07: 19 mL

## 2017-07-07 MED ORDER — FENTANYL CITRATE (PF) 100 MCG/2ML IJ SOLN
INTRAMUSCULAR | Status: DC | PRN
  Administered 2017-07-07: 50 ug via INTRAVENOUS
  Administered 2017-07-07: 25 ug via INTRAVENOUS
  Administered 2017-07-07: 100 ug via INTRAVENOUS
  Administered 2017-07-07: 50 ug via INTRAVENOUS
  Administered 2017-07-07: 25 ug via INTRAVENOUS

## 2017-07-07 MED ORDER — IOPAMIDOL (ISOVUE-300) INJECTION 61%
INTRAVENOUS | Status: DC | PRN
  Administered 2017-07-07: 3.5 mL

## 2017-07-07 MED ORDER — LIDOCAINE 2% (20 MG/ML) 5 ML SYRINGE
INTRAMUSCULAR | Status: AC
  Filled 2017-07-07: qty 10

## 2017-07-07 MED ORDER — SUGAMMADEX SODIUM 200 MG/2ML IV SOLN
INTRAVENOUS | Status: DC | PRN
  Administered 2017-07-07: 200 mg via INTRAVENOUS

## 2017-07-07 MED ORDER — PROMETHAZINE HCL 25 MG/ML IJ SOLN
INTRAMUSCULAR | Status: AC
  Filled 2017-07-07: qty 1

## 2017-07-07 MED ORDER — MIDAZOLAM HCL 5 MG/5ML IJ SOLN
INTRAMUSCULAR | Status: DC | PRN
  Administered 2017-07-07: 2 mg via INTRAVENOUS

## 2017-07-07 MED ORDER — MIDAZOLAM HCL 2 MG/2ML IJ SOLN
INTRAMUSCULAR | Status: AC
  Filled 2017-07-07: qty 2

## 2017-07-07 SURGICAL SUPPLY — 34 items
APPLIER CLIP ROT 10 11.4 M/L (STAPLE) ×2
CABLE HIGH FREQUENCY MONO STRZ (ELECTRODE) ×2 IMPLANT
CATH REDDICK CHOLANGI 4FR 50CM (CATHETERS) IMPLANT
CHLORAPREP W/TINT 26ML (MISCELLANEOUS) ×2 IMPLANT
CLIP APPLIE ROT 10 11.4 M/L (STAPLE) ×1 IMPLANT
COVER MAYO STAND STRL (DRAPES) ×2 IMPLANT
COVER SURGICAL LIGHT HANDLE (MISCELLANEOUS) ×2 IMPLANT
DECANTER SPIKE VIAL GLASS SM (MISCELLANEOUS) ×2 IMPLANT
DERMABOND ADVANCED (GAUZE/BANDAGES/DRESSINGS) ×1
DERMABOND ADVANCED .7 DNX12 (GAUZE/BANDAGES/DRESSINGS) ×1 IMPLANT
DRAPE C-ARM 42X120 X-RAY (DRAPES) ×2 IMPLANT
ELECT REM PT RETURN 15FT ADLT (MISCELLANEOUS) ×2 IMPLANT
GLOVE BIOGEL PI IND STRL 7.5 (GLOVE) ×1 IMPLANT
GLOVE BIOGEL PI INDICATOR 7.5 (GLOVE) ×1
GLOVE ECLIPSE 7.5 STRL STRAW (GLOVE) ×2 IMPLANT
GOWN STRL REUS W/TWL XL LVL3 (GOWN DISPOSABLE) ×6 IMPLANT
HEMOSTAT SNOW SURGICEL 2X4 (HEMOSTASIS) IMPLANT
HEMOSTAT SURGICEL 4X8 (HEMOSTASIS) IMPLANT
IRRIG SUCT STRYKERFLOW 2 WTIP (MISCELLANEOUS) ×2
IRRIGATION SUCT STRKRFLW 2 WTP (MISCELLANEOUS) ×1 IMPLANT
KIT BASIN OR (CUSTOM PROCEDURE TRAY) ×2 IMPLANT
POUCH RETRIEVAL ECOSAC 10 (ENDOMECHANICALS) IMPLANT
POUCH RETRIEVAL ECOSAC 10MM (ENDOMECHANICALS)
POUCH SPECIMEN RETRIEVAL 10MM (ENDOMECHANICALS) ×2 IMPLANT
SCISSORS LAP 5X35 DISP (ENDOMECHANICALS) ×2 IMPLANT
SET CHOLANGIOGRAPH MIX (MISCELLANEOUS) ×2 IMPLANT
SLEEVE XCEL OPT CAN 5 100 (ENDOMECHANICALS) ×2 IMPLANT
SUT MNCRL AB 4-0 PS2 18 (SUTURE) ×2 IMPLANT
TOWEL OR 17X26 10 PK STRL BLUE (TOWEL DISPOSABLE) ×2 IMPLANT
TRAY LAPAROSCOPIC (CUSTOM PROCEDURE TRAY) ×2 IMPLANT
TROCAR BLADELESS OPT 5 100 (ENDOMECHANICALS) ×2 IMPLANT
TROCAR XCEL BLUNT TIP 100MML (ENDOMECHANICALS) ×2 IMPLANT
TROCAR XCEL NON-BLD 11X100MML (ENDOMECHANICALS) ×2 IMPLANT
TUBING INSUF HEATED (TUBING) ×2 IMPLANT

## 2017-07-07 NOTE — Anesthesia Postprocedure Evaluation (Signed)
Anesthesia Post Note  Patient: [name]  Procedure(s) Performed: Procedure(s) (LRB): LAPAROSCOPIC CHOLECYSTECTOMY WITH INTRAOPERATIVE CHOLANGIOGRAM (N/A)     Patient location during evaluation: PACU Anesthesia Type: General Level of consciousness: awake and alert Pain management: pain level controlled Vital Signs Assessment: post-procedure vital signs reviewed and stable Respiratory status: spontaneous breathing, nonlabored ventilation, respiratory function stable and patient connected to nasal cannula oxygen Cardiovascular status: blood pressure returned to baseline and stable Postop Assessment: no signs of nausea or vomiting Anesthetic complications: no    Last Vitals:  Vitals:   07/07/17 1108 07/07/17 1135  BP: (!) 148/85 131/71  Pulse: 91 93  Resp: 15 16  Temp: 36.7 C   SpO2: 97% 98%    Last Pain:  Vitals:   07/07/17 1135  TempSrc:   PainSc: 7                  Sharisse Rantz,JAMES TERRILL

## 2017-07-07 NOTE — H&P (Signed)
History of Present Illness Marland Kitchen T. Mumin Denomme MD; 07/01/2017 12:28 PM) The patient is a 41 year old female who presents for evaluation of gall stones. Patient is well known to me status post bilateral mastectomy and reconstruction currently with tissue expanders in place. She comes to the office due to recurrent abdominal pain and cholelithiasis. She states that initially she had a sudden episode of fairly severe right upper quadrant abdominal pain associated with nausea and vomiting about 6 months ago. This resolved over several hours and she did not seek medical attention. Subsequently she had a MRI after staging CT for her breast showed liver lesions. The liver lesion was felt to be almost certainly focal nodular hyperplasia. Cholelithiasis was noted. Soon after that she had another very similar episode and realized that very likely this could be due to her gallstones. She was scheduled for evaluation. She developed another even more severe episode of right upper quadrant pain last week and was seen in the emergency department. This improved with treatment. Gallbladder ultrasound was obtained showing cholelithiasis, some gallbladder wall thickening and slight edema or fluid around the gallbladder. This pain resolved but she has continued to have pain in her right flank and back there remains significant. No nausea vomiting fever chills or jaundice. No change in bowel habits. No urinary symptoms.    Problem List/Past Medical Marland Kitchen T. Aeryn Medici, MD; 07/01/2017 12:28 PM) MALIGNANT NEOPLASM OF UPPER-OUTER QUADRANT OF LEFT FEMALE BREAST (C50.412)  BREAST CANCER, LEFT (C50.912)  PRIMARY CANCER OF LOWER INNER QUADRANT OF LEFT BREAST (C50.312)   Past Surgical History Marland Kitchen T. Verne Lanuza, MD; 07/01/2017 12:28 PM) Breast Biopsy  Bilateral.  Diagnostic Studies History Marland Kitchen T. Petra Dumler, MD; 07/01/2017 12:28 PM) Colonoscopy  never Mammogram  within last year Pap Smear  1-5 years  ago  Allergies Marland Kitchen T. Arlissa Monteverde, MD; 07/01/2017 12:28 PM) Adhesive Tape  blisters  Medication History Marland Kitchen T. Savas Elvin, MD; 07/01/2017 12:28 PM) Dexamethasone (4MG  Tablet, Oral) Active. Cyclobenzaprine HCl (10MG  Tablet, Oral) Active. Hydrocodone-Acetaminophen (5-325MG  Tablet, Oral) Active. Ibuprofen (200MG  Tablet, Oral) Active. Prochlorperazine Maleate (10MG  Tablet, Oral) Active. Ibuprofen (400MG  Tablet, Oral as needed) Active.  Social History Marland Kitchen T. Thao Bauza, MD; 07/01/2017 12:28 PM) Alcohol use  Occasional alcohol use. Caffeine use  Carbonated beverages, Coffee, Tea. No drug use  Tobacco use  Never smoker.  Family History Marland Kitchen T. Aliece Honold, MD; 07/01/2017 12:28 PM) Arthritis  Father. Bleeding disorder  Daughter. Cancer  Family Members In General. Cerebrovascular Accident  Family Members In General. Diabetes Mellitus  Family Members In General, Mother. Heart disease in female family member before age 14  Hypertension  Family Members In General, Mother. Migraine Headache  Mother. Respiratory Condition  Family Members In General. Seizure disorder  Family Members In General.  Pregnancy / Birth History Marland Kitchen T. Saylee Sherrill, MD; 07/01/2017 12:28 PM) Age at menarche  69 years. Contraceptive History  Contraceptive implant, Intrauterine device, Oral contraceptives. Gravida  4 Maternal age  36-20 Para  3 Regular periods   Other Problems Marland Kitchen T. Heran Campau, MD; 07/01/2017 12:28 PM) Breast Cancer  Gastroesophageal Reflux Disease  Lump In Breast   Vitals (Janette Ranson CMA; 07/01/2017 12:06 PM) 07/01/2017 12:06 PM Weight: 215.8 lb Height: 61in Body Surface Area: 1.95 m Body Mass Index: 40.77 kg/m  Temp.: 44F  Pulse: 100 (Regular)  BP: 110/82 (Sitting, Left Arm, Standard)       Physical Exam Marland Kitchen T. Makhi Muzquiz MD; 07/01/2017 12:29 PM) The physical exam findings are as follows: Note:General: Alert, moderately obese  Caucasian female, in  no distress Skin: Warm and dry without rash or infection. HEENT: No palpable masses or thyromegaly. Sclera nonicteric. Pupils equal round and reactive. Oropharynx clear. Lymph nodes: No cervical, supraclavicular, or inguinal nodes palpable. Lungs: Breath sounds clear and equal. No wheezing or increased work of breathing. Cardiovascular: Regular rate and rhythm without murmer. No JVD or edema. Peripheral pulses intact. No carotid bruits. Abdomen: Mild to moderate right upper quadrant tenderness without guarding No masses palpable. No organomegaly. No palpable hernias. Extremities: No edema or joint swelling or deformity. No chronic venous stasis changes. Neurologic: Alert and fully oriented. Gait normal. No focal weakness. Psychiatric: Normal mood and affect. Thought content appropriate with normal judgement and insight    Assessment & Plan Marland Kitchen T. Soniyah Mcglory MD; 07/01/2017 12:31 PM) CALCULUS OF GALLBLADDER WITH CHOLECYSTITIS WITHOUT BILIARY OBSTRUCTION, UNSPECIFIED CHOLECYSTITIS ACUITY (K80.10) Impression: Several episodes of acute abdominal pain entirely consistent with biliary colic. She now has ongoing right flank discomfort and some tenderness. Gallstones confirmed on MRI and ultrasound without evidence of common bile duct abnormalities. I recommended proceeding with laparoscopic cholecystectomy with cholangiogram to relieve her symptoms and prevent further complications from her gallbladder. I discussed the procedure in detail. The patient was given Neurosurgeon. We discussed the risks and benefits of a laparoscopic cholecystectomy and possible cholangiogram including, but not limited to, bleeding, infection, injury to surrounding structures such as the intestine or liver, bile leak, retained gallstones, need to convert to an open procedure, prolonged diarrhea, blood clots such as DVT, common bile duct injury, anesthesia risks, and possible need for additional  procedures. The likelihood of improvement in symptoms and return to the patient's normal status is good. We discussed the typical post-operative recovery course. All questions were answered. Current Plans Schedule for Surgery  Laparoscopic cholecystectomy with intraoperative cholangiogram under general anesthesia as an outpatient

## 2017-07-07 NOTE — Interval H&P Note (Signed)
History and Physical Interval Note:  07/07/2017 8:21 AM  Cheryl [name] [name]  has presented today for surgery, with the diagnosis of cholelthiasis  The various methods of treatment have been discussed with the patient and family. After consideration of risks, benefits and other options for treatment, the patient has consented to  Procedure(s): LAPAROSCOPIC CHOLECYSTECTOMY WITH INTRAOPERATIVE CHOLANGIOGRAM (N/A) as a surgical intervention .  The patient's history has been reviewed, patient examined, no change in status, stable for surgery.  I have reviewed the patient's chart and labs.  Questions were answered to the patient's satisfaction.     [name]

## 2017-07-07 NOTE — Op Note (Signed)
Preoperative diagnosis: Cholelithiasis and cholecystitis  Postoperative diagnosis: Cholelithiasis and cholecystitis  Surgical procedure: Laparoscopic cholecystectomy with intraoperative cholangiogram  Surgeon: Marland Kitchen T. Kota Ciancio M.D.  Assistant: None  Anesthesia: General Endotracheal  Complications: None  Estimated blood loss: Minimal  Description of procedure: The patient brought to the operating room, placed in the supine position on the operating table, and general endotracheal anesthesia induced. The abdomen was widely sterilely prepped and draped. The patient had received preoperative IV antibiotics and PAS were in place. Patient timeout was performed the correct procedure verified. Standard 4 port technique was used with an open Hassan cannula at the umbilicus and the remainder of the ports placed under direct vision. The gallbladder was visualized. It appeared somewhat thickened and edematous. The fundus was grasped and elevated up over the liver and the infundibulum retracted inferiolaterally. Peritoneum anterior and posterior to close triangle was incised and fibrofatty tissue stripped off the neck of the gallbladder toward the porta hepatis. The distal gallbladder was thoroughly dissected. The cystic artery was identified in Calot's triangle and the cystic duct gallbladder junction dissected 360.  A good critical view was obtained. When the anatomy was clear the cystic duct was clipped at the gallbladder junction and an operative cholangiogram obtained through the cystic duct.  An obstructing stone was milked out of the cystic duct first. This showed good filling of a normal common bile duct and intrahepatic ducts with free flow into the duodenum and no filling defects. Following this the cholangiocath was removed and the cystic duct was doubly clipped proximally and divided. The cystic artery was doubly clipped proximally and distally and divided. The gallbladder was dissected free  from its bed using hook cautery and removed through the umbilical port site. Complete hemostasis was obtained in the gallbladder bed. The right upper quadrant was thoroughly irrigated and hemostasis assured. Trochars were removed and all CO2 evacuated and the [facility] trocar site fascial defect closed. Skin incisions were closed with subcuticular Monocryl and Dermabond. Sponge needle and instrument counts were correct. The patient was taken to PACU in good condition.  Miyonna Ormiston T  07/07/2017

## 2017-07-07 NOTE — Discharge Instructions (Signed)
[facility] ______[facility] °LAPAROSCOPIC SURGERY: POST OP INSTRUCTIONS °Always review your discharge instruction sheet given to you by the facility where your surgery was performed. °IF YOU HAVE DISABILITY OR FAMILY LEAVE FORMS, YOU MUST BRING THEM TO THE OFFICE FOR PROCESSING.   °DO NOT GIVE THEM TO YOUR DOCTOR. ° °1. A prescription for pain medication may be given to you upon discharge.  Take your pain medication as prescribed, if needed.  If narcotic pain medicine is not needed, then you may take acetaminophen (Tylenol) or ibuprofen (Advil) as needed. °2. Take your usually prescribed medications unless otherwise directed. °3. If you need a refill on your pain medication, please contact your pharmacy.  They will contact our office to request authorization. Prescriptions will not be filled after 5pm or on week-ends. °4. You should follow a light diet the first few days after arrival home, such as soup and crackers, etc.  Be sure to include lots of fluids daily. °5. Most patients will experience some swelling and bruising in the area of the incisions.  Ice packs will help.  Swelling and bruising can take several days to resolve.  °6. It is common to experience some constipation if taking pain medication after surgery.  Increasing fluid intake and taking a stool softener (such as Colace) will usually help or prevent this problem from occurring.  A mild laxative (Milk of Magnesia or Miralax) should be taken according to package instructions if there are no bowel movements after 48 hours. °7. Unless discharge instructions indicate otherwise, you may remove your bandages 24-48 hours after surgery, and you may shower at that time.  You may have steri-strips (small skin tapes) in place directly over the incision.  These strips should be left on the skin for 7-10 days.  If your surgeon used skin glue on the incision, you may shower in 24 hours.  The glue will flake off over the next 2-3 weeks.  Any sutures or  staples will be removed at the office during your follow-up visit. °8. ACTIVITIES:  You may resume regular (light) daily activities beginning the next day--such as daily self-care, walking, climbing stairs--gradually increasing activities as tolerated.  You may have sexual intercourse when it is comfortable.  Refrain from any heavy lifting or straining until approved by your doctor. °a. You may drive when you are no longer taking prescription pain medication, you can comfortably wear a seatbelt, and you can safely maneuver your car and apply brakes. °b. RETURN TO WORK:  __________________________________________________________ °9. You should see your doctor in the office for a follow-up appointment approximately 2-3 weeks after your surgery.  Make sure that you call for this appointment within a day or two after you arrive home to insure a convenient appointment time. °10. OTHER INSTRUCTIONS: __________________________________________________________________________________________________________________________ __________________________________________________________________________________________________________________________ °WHEN TO CALL YOUR DOCTOR: °1. Fever over 101.0 °2. Inability to urinate °3. Continued bleeding from incision. °4. Increased pain, redness, or drainage from the incision. °5. Increasing abdominal pain ° °The clinic staff is available to answer your questions during regular business hours.  Please don’t hesitate to call and ask to speak to one of the nurses for clinical concerns.  If you have a medical emergency, go to the nearest emergency room or call 911.  A surgeon from [facility] is always on call at the hospital. °[address], [address], [place], [place]  [address] ? [address] [address], [place], [place]   [address] °[phone] ? [phone] ? FAX [phone] °Web site:   [email or web address] ° °General Anesthesia, Adult, Care After °These instructions  provide you with information about caring for yourself after your procedure. Your health care provider may also give you more specific instructions. Your treatment has been planned according to current medical practices, but problems sometimes occur. Call your health care provider if you have any problems or questions after your procedure. °What can I expect after the procedure? °After the procedure, it is common to have: °· Vomiting. °· A sore throat. °· Mental slowness. ° °It is common to feel: °· Nauseous. °· Cold or shivery. °· Sleepy. °· Tired. °· Sore or achy, even in parts of your body where you did not have surgery. ° °Follow these instructions at home: °For at least 24 hours after the procedure: °· Do not: °? Participate in activities where you could fall or become injured. °? Drive. °? Use heavy machinery. °? Drink alcohol. °? Take sleeping pills or medicines that cause drowsiness. °? Make important decisions or sign legal documents. °? Take care of children on your own. °· Rest. °Eating and drinking °· If you vomit, drink water, juice, or soup when you can drink without vomiting. °· Drink enough fluid to keep your urine clear or pale yellow. °· Make sure you have little or no nausea before eating solid foods. °· Follow the diet recommended by your health care provider. °General instructions °· Have a responsible adult stay with you until you are awake and alert. °· Return to your normal activities as told by your health care provider. Ask your health care provider what activities are safe for you. °· Take over-the-counter and prescription medicines only as told by your health care provider. °· If you smoke, do not smoke without supervision. °· Keep all follow-up visits as told by your health care provider. This is important. °Contact a health care provider if: °· You continue to have nausea or vomiting at home, and medicines are not helpful. °· You cannot drink fluids or start eating again. °· You cannot  urinate after 8-12 hours. °· You develop a skin rash. °· You have fever. °· You have increasing redness at the site of your procedure. °Get help right away if: °· You have difficulty breathing. °· You have chest pain. °· You have unexpected bleeding. °· You feel that you are having a life-threatening or urgent problem. °This information is not intended to replace advice given to you by your health care provider. Make sure you discuss any questions you have with your health care provider. °Document Released: 02/15/2001 Document Revised: 04/13/2016 Document Reviewed: 10/24/2015 °Elsevier Interactive Patient Education © 2018 [employer] ° ° °

## 2017-07-07 NOTE — Anesthesia Procedure Notes (Signed)
Procedure Name: Intubation Date/Time: 07/07/2017 8:41 AM Performed by: Ajit Errico, Virgel Gess Pre-anesthesia Checklist: Patient identified, Emergency Drugs available, Suction available and Patient being monitored Patient Re-evaluated:Patient Re-evaluated prior to induction Oxygen Delivery Method: Circle system utilized Preoxygenation: Pre-oxygenation with 100% oxygen Induction Type: IV induction Ventilation: Mask ventilation without difficulty Laryngoscope Size: Miller and 2 Grade View: Grade I Tube type: Oral Tube size: 7.5 mm Number of attempts: 1 Airway Equipment and Method: Stylet Placement Confirmation: ETT inserted through vocal cords under direct vision,  positive ETCO2 and breath sounds checked- equal and bilateral Secured at: 20 cm Tube secured with: Tape Dental Injury: Teeth and Oropharynx as per pre-operative assessment

## 2017-07-07 NOTE — Anesthesia Preprocedure Evaluation (Signed)
Anesthesia Evaluation  Patient identified by MRN, date of birth, ID band Patient awake    Reviewed: Allergy & Precautions, NPO status , Patient's Chart, lab work & pertinent test results  Airway Mallampati: II  TM Distance: >3 FB Neck ROM: Full    Dental no notable dental hx.    Pulmonary neg pulmonary ROS,    breath sounds clear to auscultation       Cardiovascular negative cardio ROS   Rhythm:Regular Rate:Normal     Neuro/Psych    GI/Hepatic negative GI ROS, Neg liver ROS,   Endo/Other  negative endocrine ROS  Renal/GU negative Renal ROS     Musculoskeletal   Abdominal   Peds  Hematology negative hematology ROS (+)   Anesthesia Other Findings   Reproductive/Obstetrics                             Anesthesia Physical Anesthesia Plan  ASA: II  Anesthesia Plan: General   Post-op Pain Management:    Induction: Intravenous  PONV Risk Score and Plan: 4 or greater and Ondansetron, Dexamethasone, Scopolamine patch - Pre-op and Treatment may vary due to age or medical condition  Airway Management Planned: Oral ETT  Additional Equipment:   Intra-op Plan:   Post-operative Plan: Extubation in OR  Informed Consent: I have reviewed the patients History and Physical, chart, labs and discussed the procedure including the risks, benefits and alternatives for the proposed anesthesia with the patient or authorized representative who has indicated his/her understanding and acceptance.   Dental advisory given  Plan Discussed with:   Anesthesia Plan Comments:         Anesthesia Quick Evaluation

## 2017-07-07 NOTE — Transfer of Care (Signed)
Immediate Anesthesia Transfer of Care Note  Patient: Cheryl [name]  Procedure(s) Performed: Procedure(s): LAPAROSCOPIC CHOLECYSTECTOMY WITH INTRAOPERATIVE CHOLANGIOGRAM (N/A)  Patient Location: PACU  Anesthesia Type:General  Level of Consciousness:  sedated, patient cooperative and responds to stimulation  Airway & Oxygen Therapy:Patient Spontanous Breathing and Patient connected to face mask oxgen  Post-op Assessment:  Report given to PACU RN and Post -op Vital signs reviewed and stable  Post vital signs:  Reviewed and stable  Last Vitals:  Vitals:   07/07/17 0646  BP: (!) 145/99  Pulse: (!) 112  Resp: 16  Temp: 36.8 C  SpO2: 721%    Complications: No apparent anesthesia complications

## 2017-07-08 ENCOUNTER — Encounter (HOSPITAL_COMMUNITY): Payer: Self-pay | Admitting: General Surgery

## 2017-07-20 ENCOUNTER — Encounter: Payer: Self-pay | Admitting: Oncology

## 2017-07-22 ENCOUNTER — Other Ambulatory Visit: Payer: 59

## 2017-07-22 ENCOUNTER — Ambulatory Visit (HOSPITAL_BASED_OUTPATIENT_CLINIC_OR_DEPARTMENT_OTHER): Payer: 59 | Admitting: Oncology

## 2017-07-22 ENCOUNTER — Ambulatory Visit: Payer: 59

## 2017-07-22 VITALS — BP 143/92 | HR 85 | Temp 98.3°F | Resp 17 | Ht 60.0 in | Wt 210.1 lb

## 2017-07-22 DIAGNOSIS — C773 Secondary and unspecified malignant neoplasm of axilla and upper limb lymph nodes: Secondary | ICD-10-CM | POA: Diagnosis not present

## 2017-07-22 DIAGNOSIS — C50512 Malignant neoplasm of lower-outer quadrant of left female breast: Secondary | ICD-10-CM | POA: Diagnosis not present

## 2017-07-22 DIAGNOSIS — Z171 Estrogen receptor negative status [ER-]: Secondary | ICD-10-CM | POA: Diagnosis not present

## 2017-07-22 DIAGNOSIS — C50912 Malignant neoplasm of unspecified site of left female breast: Secondary | ICD-10-CM

## 2017-07-22 DIAGNOSIS — Z853 Personal history of malignant neoplasm of breast: Secondary | ICD-10-CM

## 2017-07-22 DIAGNOSIS — C50412 Malignant neoplasm of upper-outer quadrant of left female breast: Secondary | ICD-10-CM

## 2017-07-22 NOTE — Progress Notes (Signed)
[place]  Telephone:([phone]) [phone] Fax:(336) [phone]     ID: Cheryl [name] DOB: 1976-06-02  MR#: [id number]  OZY#:[id number]  Patient Care Team: [facility], [facility] as PCP - General (Family Medicine) [name], MD as Consulting Physician (Obstetrics and Gynecology) [name], MD as Consulting Physician (General Surgery) Chasady Longwell, Virgie Dad, MD as Consulting Physician (Oncology) [name], RN as Registered Nurse [name], RN as Registered Nurse Dillingham, [name], DO as Attending Physician (Plastic Surgery) PCP: Practice, [facility] OTHER MD:  CHIEF COMPLAINT: Recurrent triple negative breast cancer  CURRENT TREATMENT: Adjuvant chemotherapy   INTERVAL HISTORY:  Cheryl [name] returns today for follow-up and treatment of her triple negative recurrent breast cancer. In the interval since her last visit here, she underwent cholecystectomy under Dr. [name], on 07/07/2017. The final pathology ([id number]) showed no malignancy.  Because of the abdominal pain and problems she was having she did not receive her third cycle of CMF. She would have received her fourth cycle today but of course we are now 6 weeks behind.  REVIEW OF SYSTEMS: Quinley still has some nausea and is intolerant of spicy food at present. She has pain in the right flank area. She tells me she had this before the surgery but it did not go away with the surgery. She does not have any nausea or vomiting problems. Bowel movements are normal. She doesn't have any unusual headaches, visual changes, cough, phlegm production, or pleurisy. [name] not exercising at present. [name] back to work. A detailed review of systems today was otherwise stable   HISTORY OF RECURRENT DISEASE: From my 03/18/2017 note:  Cheryl [name] that he'll returns today for follow-up of her triple negative breast cancer, which is now recurrent. She had routine bilateral diagnostic  mammography at the [facility] 03/10/2017 and this showed the breast density to be category C. The old lumpectomy site, which was in the upper outer quadrant, appears stable. However there was a new area of asymmetry in the lower inner quadrant of the left breast. Ultrasound confirmed this to be a 1.0 cm mass at the 7:00 radiant 5 cm from the nipple. Aspiration was attempted but no fluid was aspirated and biopsy of this mass was obtained 03/05/2017. This showed ([id number]) invasive mammary carcinoma, grade 2, estrogen and progesterone receptor negative, HER-2 not amplified with a signals ratio 1.17 and a number per cell of 2.45, with an MIB-1 of 15%.  [name] here today to discuss subsequent management   BREAST CANCER HISTORY: From the original intake note:  The patient had screening mammography 02/01/2015 which showed a calcifications in the upper outer quadrant of the left breast. On 02/08/2015 she underwent left diagnostic mammography with tomosynthesis and left breast ultrasonography at the [facility] Center. The breast density was category C. Mammography confirmed a group of pleomorphic calcifications spanning 4.2 cm maximally. Physical examination of the upper outer quadrant of the left breast showed an area of thickening at the approximate 2:00 position. Ultrasound of this area showed no discrete masses. There was vague shadowing present. Calcifications could not be clearly identified sonographically. There was no lymphadenopathy in the left axilla.  On the same day the patient underwent biopsy of the left breast area in question, with the pathology (S [id number]) showing ductal carcinoma in situ, grade 2 or 3, with possible areas of microinvasion, the cancer cells being strongly estrogen and progesterone receptor positive, both at 100%.  On 02/15/2015 the patient underwent  bilateral breast MRI. This showed the area of malignancy in the upper outer quadrant to measure 4.5 cm, including a large  area of non-masslike enhancement and a 1 cm spiculated mass. There was also an indeterminate oval mass in the central left breast and another indeterminant mass in the slightly outer right breast anteriorly. Biopsy of the right breast mass 02/18/2015 ([id number]) showed a fibroadenoma, with no evidence of malignancy. Biopsy of 2 additional areas of the left breast (at 10:00 and 2:00) showed a fibroadenoma in the upper inner quadrant, and pseudo-angiomatous stromal hyperplasia (PA SH) at the 2:00 area of the left breast.  The patient's subsequent history is as detailed below.   PAST MEDICAL HISTORY: Past Medical History:  Diagnosis Date  . Breast cancer ([place])    left breast  . Wears contact lenses     PAST SURGICAL HISTORY: Past Surgical History:  Procedure Laterality Date  . BILATERAL TOTAL MASTECTOMY WITH AXILLARY LYMPH NODE DISSECTION  04/20/2017  . BREAST BIOPSY Left 02/18/2015  . BREAST BIOPSY Right 02/18/2015  . BREAST EXCISIONAL BIOPSY Left 04/05/2015  . BREAST LUMPECTOMY Left 03/27/2015  . BREAST LUMPECTOMY WITH NEEDLE LOCALIZATION AND AXILLARY SENTINEL LYMPH NODE BX Left 03/27/2015   Procedure: LEFT BREAST LUMPECTOMY WITH BRACKETED NEEDLE LOCALIZATION AND LEFT  AXILLARY SENTINEL LYMPH NODE BX;  Surgeon: [name], MD;  Location: [place];  Service: General;  Laterality: Left;  . BREAST RECONSTRUCTION WITH PLACEMENT OF TISSUE EXPANDER AND FLEX HD (ACELLULAR HYDRATED DERMIS) Bilateral 04/20/2017   Procedure: BILATARAL BREAST RECONSTRUCTION WITH PLACEMENT OF TISSUE EXPANDER AND FLEX HD (ACELLULAR HYDRATED DERMIS);  Surgeon: Wallace Going, DO;  Location: [place];  Service: Plastics;  Laterality: Bilateral;  . BREAST SURGERY    . CHOLECYSTECTOMY N/A 07/07/2017   Procedure: LAPAROSCOPIC CHOLECYSTECTOMY WITH INTRAOPERATIVE CHOLANGIOGRAM;  Surgeon: [name], MD;  Location: [facility];  Service: General;  Laterality: N/A;  . MASTECTOMY W/ SENTINEL NODE  BIOPSY Bilateral 04/20/2017   Procedure: BILATERAL TOTAL MASTECTOMY WITH LEFT AXILLARY SENTINEL LYMPH NODE BIOPSY;  Surgeon: [name], MD;  Location: [place];  Service: General;  Laterality: Bilateral;  . PORTACATH PLACEMENT Right 04/25/2015   Procedure: INSERTION PORT-A-CATH;  Surgeon: [name], MD;  Location: [place];  Service: General;  Laterality: Right;  . PORTACATH PLACEMENT Right 04/20/2017   Procedure: INSERTION PORT-A-CATH;  Surgeon: [name], MD;  Location: [place];  Service: General;  Laterality: Right;  . RE-EXCISION OF BREAST LUMPECTOMY Left 04/05/2015   Procedure: RE-EXCISION LEFT  BREAST LUMPECTOMY;  Surgeon: [name], MD;  Location: [place];  Service: General;  Laterality: Left;    FAMILY HISTORY Family History  Problem Relation Age of Onset  . Lung cancer Maternal Grandmother 65       non smoker  . Lung cancer Maternal Grandfather 26       non smoker worked in [employer]  . Cancer Paternal Grandfather 35       throat cancer ? smoker  . Cancer Cousin 41       throat cancer ? smoker   the patient's parents are living, in their mid-50s as of March 2016. The patient had no siblings. Both the patient's mother's parents died from lung cancer although they did not smoke. [place] and her maternal grandfather was a Ecologist. There is no history of breast or ovarian cancer in the family to her knowledge  GYNECOLOGIC HISTORY:  No LMP  recorded. Patient has had an implant. Menarche age 19, first live birth age 5. The patient is GX P3. [name] having regular periods. She uses the Essure device for contraception.  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 Her husband [name] is a Freight forwarder. Their daughter [name] lives in [place] where she works in Press photographer. Daughter Summer is a Research scientist (physical sciences) and lives with the patient.. Daughter [name] also at home is 20 years  old.    ADVANCED DIRECTIVES: Not in place   HEALTH MAINTENANCE: Social History  Substance Use Topics  . Smoking status: Never Smoker  . Smokeless tobacco: Never Used  . Alcohol use 0.0 oz/week     Comment: occ     Colonoscopy:  PAP:  Bone density:  Lipid panel:  Allergies  Allergen Reactions  . Adhesive [Tape] Other (See Comments)    Skin blisters    Current Outpatient Prescriptions  Medication Sig Dispense Refill  . cyclobenzaprine (FLEXERIL) 10 MG tablet Take 10 mg by mouth 3 (three) times daily as needed for muscle spasms.    Marland Kitchen dexamethasone (DECADRON) 4 MG tablet Take 1 tablet twice a day with meals beginning the day after chemotherapy and continuing for 3 days (Patient taking differently: Take 4 mg by mouth See admin instructions. Take 1 tablet twice a day with meals beginning the day after chemotherapy and continuing for 3 days) 50 tablet 1  . HYDROcodone-acetaminophen (NORCO/VICODIN) 5-325 MG tablet Take 1 tablet by mouth every 6 (six) hours as needed for moderate pain.    Marland Kitchen ibuprofen (ADVIL,MOTRIN) 200 MG tablet Take 400 mg by mouth every 8 (eight) hours as needed for mild pain.    Marland Kitchen lidocaine-prilocaine (EMLA) cream Apply to affected area once (Patient taking differently: Apply 1 application topically daily as needed (prior to port being accessed). ) 30 g 3  . oxyCODONE-acetaminophen (PERCOCET/ROXICET) 5-325 MG tablet Take 1 tablet by mouth every 4 (four) hours as needed for severe pain. 15 tablet 0  . prochlorperazine (COMPAZINE) 10 MG tablet Take 1 tablet (10 mg total) by mouth every 6 (six) hours as needed (Nausea or vomiting). 30 tablet 1   No current facility-administered medications for this visit.     OBJECTIVE:  Young white woman in no acute distress  Vitals:   07/22/17 1046  BP: (!) 143/92  Pulse: 85  Resp: 17  Temp: 98.3 F (36.8 C)  SpO2: 99%     Body mass index is 41.03 kg/m.    ECOG FS:1 - Symptomatic but completely ambulatory  Sclerae  unicteric, pupils round and equal Oropharynx clear and moist No cervical or supraclavicular adenopathy Lungs no rales or rhonchi Heart regular rate and rhythm Abd soft, nontender, positive bowel sounds; no pain to palpation in the right flank area MSK no focal spinal tenderness, no upper extremity lymphedema Neuro: nonfocal, well oriented, appropriate affect Breasts: Status post bilateral mastectomies with bilateral implants in place. The cosmetic result at present is poor. There is no evidence of local recurrence. Both axillae are benign.    LAB RESULTS:  CMP     Component Value Date/Time   NA 137 06/29/2017 0136   NA 141 06/10/2017 0939   K 4.0 06/29/2017 0136   K 4.2 06/10/2017 0939   CL 102 06/29/2017 0136   CO2 24 06/29/2017 0136   CO2 24 06/10/2017 0939   GLUCOSE 144 (H) 06/29/2017 0136   GLUCOSE 114 06/10/2017 0939   BUN 9 06/29/2017 0136  BUN 14.5 06/10/2017 0939   CREATININE 0.92 06/29/2017 0136   CREATININE 1.1 06/10/2017 0939   CALCIUM 9.5 06/29/2017 0136   CALCIUM 10.0 06/10/2017 0939   PROT 6.6 06/29/2017 0136   PROT 7.6 06/10/2017 0939   ALBUMIN 3.7 06/29/2017 0136   ALBUMIN 3.8 06/10/2017 0939   AST 37 06/29/2017 0136   AST 21 06/10/2017 0939   ALT 47 06/29/2017 0136   ALT 23 06/10/2017 0939   ALKPHOS 103 06/29/2017 0136   ALKPHOS 122 06/10/2017 0939   BILITOT 0.8 06/29/2017 0136   BILITOT 0.71 06/10/2017 0939   GFRNONAA >60 06/29/2017 0136   GFRAA >60 06/29/2017 0136    INo results found for: SPEP, UPEP  Lab Results  Component Value Date   WBC 5.1 06/29/2017   NEUTROABS 2.9 06/10/2017   HGB 11.3 (L) 06/29/2017   HCT 34.5 (L) 06/29/2017   MCV 86.7 06/29/2017   PLT 369 06/29/2017      Chemistry      Component Value Date/Time   NA 137 06/29/2017 0136   NA 141 06/10/2017 0939   K 4.0 06/29/2017 0136   K 4.2 06/10/2017 0939   CL 102 06/29/2017 0136   CO2 24 06/29/2017 0136   CO2 24 06/10/2017 0939   BUN 9 06/29/2017 0136   BUN 14.5  06/10/2017 0939   CREATININE 0.92 06/29/2017 0136   CREATININE 1.1 06/10/2017 0939      Component Value Date/Time   CALCIUM 9.5 06/29/2017 0136   CALCIUM 10.0 06/10/2017 0939   ALKPHOS 103 06/29/2017 0136   ALKPHOS 122 06/10/2017 0939   AST 37 06/29/2017 0136   AST 21 06/10/2017 0939   ALT 47 06/29/2017 0136   ALT 23 06/10/2017 0939   BILITOT 0.8 06/29/2017 0136   BILITOT 0.71 06/10/2017 0939       No results found for: LABCA2  No components found for: DUKGU542  No results for input(s): INR in the last 168 hours.  Urinalysis    Component Value Date/Time   COLORURINE YELLOW 06/29/2017 0132   APPEARANCEUR CLEAR 06/29/2017 0132   LABSPEC 1.012 06/29/2017 0132   LABSPEC 1.020 05/30/2015 1502   PHURINE 6.0 06/29/2017 0132   GLUCOSEU NEGATIVE 06/29/2017 0132   GLUCOSEU Negative 05/30/2015 1502   HGBUR SMALL (A) 06/29/2017 0132   BILIRUBINUR NEGATIVE 06/29/2017 0132   BILIRUBINUR Negative 05/30/2015 1502   KETONESUR NEGATIVE 06/29/2017 0132   PROTEINUR NEGATIVE 06/29/2017 0132   UROBILINOGEN 0.2 05/30/2015 1502   NITRITE NEGATIVE 06/29/2017 0132   LEUKOCYTESUR NEGATIVE 06/29/2017 0132   LEUKOCYTESUR Negative 05/30/2015 1502    STUDIES: Dg Cholangiogram Operative  Result Date: 07/07/2017 CLINICAL DATA:  Gallstones EXAM: INTRAOPERATIVE CHOLANGIOGRAM TECHNIQUE: Cholangiographic images from the C-arm fluoroscopic device were submitted for interpretation post-operatively. Please see the procedural report for the amount of contrast and the fluoroscopy time utilized. COMPARISON:  None. FINDINGS: Contrast fills the biliary tree and duodenum without filling defects in the common bile duct. IMPRESSION: Patent biliary tree. Electronically Signed   By: Marybelle Killings M.D.   On: 07/07/2017 11:53   US Abdomen Limited Ruq  Result Date: 06/29/2017 CLINICAL DATA:  Right upper quadrant abdominal pain for 7 hours. Personal history of breast cancer. EXAM: ULTRASOUND ABDOMEN LIMITED RIGHT UPPER  QUADRANT COMPARISON:  MRI of the abdomen 04/06/2017 FINDINGS: Gallbladder: Multiple shadowing gallstones are present. The largest stone measures 7 mm. Pericholecystic edema or fluid is noted. The gallbladder wall is thickened at 4.1 mm. No sonographic Percell Miller sign is reported. Common  bile duct: Diameter: 4.5 mm, within normal limits. Liver: The liver is diffusely hyperechoic. A focal lesion within the left lobe of the liver measures 3.3 x 2.9 x 2.5 cm. This corresponds with the area of focal nodular hyperplasia identified on the recent MRI scan. No other discrete lesions are present. IMPRESSION: 1. Gallbladder wall thickening and cholelithiasis is suggestive of acute cholecystitis. 2. Hepatic steatosis. 3. Focal hypoechoic lesion compatible with FNH as described on recent MRI. Electronically Signed   By: San Morelle M.D.   On: 06/29/2017 07:39    ASSESSMENT: 41 y.o. [place], [place] woman status post left breast upper outer quadrant biopsy 02/08/2015 for ductal carcinoma in situ, grade 2 or 3, strongly estrogen and progesterone receptor positive, with likely areas of microinvasion  (a) biopsy of an area in the left breast upper outer quadrant showed PASH  (b) biopsy of 2 additional questionable areas, one in each breast, showed bilateral fibroadenomas  (1) genetics testing March 2016 through the BreastNext gene panel offered by [employer] showed no deleterious mutations in ATM, BARD1, BRCA1, BRCA2, BRIP1, CDH1, CHEK2, MRE11A, MUTYH, NBN, NF1, PALB2, PTEN, RAD50, RAD51C, RAD51D, and TP53.  (2) status post left lumpectomy with sentinel lymph node sampling 03/27/2015 for a pT1c pN0, stage IA invasive ductal carcinoma, grade 3, triple negative, with an MIB-1 of 33%  (a) close margins were cleared with subsequent excision 04/05/2015.  (3) Oncotype DX score of 38 predicts a risk of 26% outside the breast recurrence within 10 years if the patient's only systemic treatment is tamoxifen for 5 years  (4)  adjuvant chemotherapy started 05/02/2015 consisting of doxorubicin and cyclophosphamide in dose dense fashion 4, completed 06/13/2015, followed by paclitaxel weekly 12.   (a) Paclitaxel stopped after only 2 cycles because of persistent neuropathy.  (5) adjuvant radiation completed 11/08  (5) tamoxifen started 02/20/2015 (neoadjuvantly), discontinued 04/19/2015 so as not to overlap chemotherapy; resumed 02/07/2016, discontinued April 2018 with disease recurrence  RECURRENT DISEASE: April 2018 (6) left breast lower outer quadrant biopsy 03/05/2017 shows invasive ductal carcinoma, grade 2, triple negative  (7) status post bilateral mastectomies and left axillary lymph node dissection 04/20/2017 showing a residual left pT2 pN0 (8 nodes removed) invasive ductal carcinoma, grade 3, with negative margins; the right breast was benign  (8) chemotherapy for her recurrence consisting of cyclophosphamide, methotrexate and fluorouracil (CMF) given every 21 days 8, started 05/13/2017  (9) status post cholecystectomy 07/07/2017, with benign pathology  PLAN Shakina has recovered sufficiently from her cholecystectomy that we can resume chemotherapy.  The problem is that she would really like to finish her breast reconstruction before the end of the year and her plastic surgeon really does not want to do the reconstruction while she is getting chemotherapy. This means we would need to stop the chemotherapy no later than the end of November.  She considered not having any further chemotherapy. However I am very concerned about the risk of distant recurrence in her case and if she does have recurrence to her liver lungs her bones then everything she doesn't terms of reconstruction is entirely beside the point and she would need to be an chemotherapy for the rest of her life until the tumor took her life. This is something we really don't want.  I suggested we switch to Carbo Gemzar day 1 and day 8 and received  3 cycles, which is 6 doses, over 9 weeks. However she very likely would lose her hair. She really does not want to lose her  hair.  Given those constraints, which we ultimately decided to do is resume the CMF, and stop at 7 cycles total. This means her last treatment will be the last week in November. She will be able to have her reconstruction anytime in the second half of December. She should be able to keep her hair.  She will see [place] again with cycle #4 and then with every subsequent cycle until she completes her treatments. She knows to call for any problems that may develop before the next visit here.    [name], MD   07/22/2017 11:16 AM

## 2017-07-24 ENCOUNTER — Emergency Department
Admission: EM | Admit: 2017-07-24 | Discharge: 2017-07-25 | Disposition: A | Discharge status: Home / Self Care | Payer: Commercial Managed Care - HMO | Attending: Student in an Organized Health Care Education/Training Program | Admitting: Student in an Organized Health Care Education/Training Program

## 2017-07-24 ENCOUNTER — Emergency Department: Payer: Commercial Managed Care - HMO

## 2017-07-24 ENCOUNTER — Encounter: Payer: Self-pay | Admitting: Emergency Medicine

## 2017-07-24 ENCOUNTER — Other Ambulatory Visit: Payer: Self-pay

## 2017-07-24 DIAGNOSIS — R110 Nausea: Secondary | ICD-10-CM | POA: Diagnosis not present

## 2017-07-24 DIAGNOSIS — R1011 Right upper quadrant pain: Secondary | ICD-10-CM | POA: Diagnosis not present

## 2017-07-24 DIAGNOSIS — Z79899 Other long term (current) drug therapy: Secondary | ICD-10-CM | POA: Insufficient documentation

## 2017-07-24 DIAGNOSIS — R112 Nausea with vomiting, unspecified: Secondary | ICD-10-CM | POA: Diagnosis not present

## 2017-07-24 DIAGNOSIS — R109 Unspecified abdominal pain: Secondary | ICD-10-CM | POA: Diagnosis not present

## 2017-07-24 LAB — COMPREHENSIVE METABOLIC PANEL
ALBUMIN: 4.5 g/dL (ref 3.5–5.0)
ALT: 30 U/L (ref 14–54)
ANION GAP: 10 (ref 5–15)
AST: 31 U/L (ref 15–41)
Alkaline Phosphatase: 105 U/L (ref 38–126)
BILIRUBIN TOTAL: 0.9 mg/dL (ref 0.3–1.2)
BUN: 10 mg/dL (ref 6–20)
CHLORIDE: 101 mmol/L (ref 101–111)
CO2: 27 mmol/L (ref 22–32)
Calcium: 9.6 mg/dL (ref 8.9–10.3)
Creatinine, Ser: 0.89 mg/dL (ref 0.44–1.00)
GFR calc Af Amer: >60 mL/min (ref >60)
GFR calc non Af Amer: >60 mL/min (ref >60)
GLUCOSE: 136 mg/dL — AB (ref 65–99)
POTASSIUM: 3.8 mmol/L (ref 3.5–5.1)
Sodium: 138 mmol/L (ref 135–145)
TOTAL PROTEIN: 8.2 g/dL — AB (ref 6.5–8.1)

## 2017-07-24 LAB — CBC
HCT: 38.5 % (ref 35.0–47.0)
Hemoglobin: 12.9 g/dL (ref 12.0–16.0)
MCH: 28.7 pg (ref 26.0–34.0)
MCHC: 33.6 g/dL (ref 32.0–36.0)
MCV: 85.4 fL (ref 80.0–100.0)
Platelets: 397 10*3/uL (ref 150–440)
RBC: 4.51 MIL/uL (ref 3.80–5.20)
RDW: 15.1 % — ABNORMAL HIGH (ref 11.5–14.5)
WBC: 10.7 10*3/uL (ref 3.6–11.0)

## 2017-07-24 LAB — URINALYSIS, COMPLETE (UACMP) WITH MICROSCOPIC
Bilirubin Urine: NEGATIVE
GLUCOSE, UA: NEGATIVE mg/dL
Ketones, ur: NEGATIVE mg/dL
LEUKOCYTES UA: NEGATIVE
NITRITE: NEGATIVE
PROTEIN: NEGATIVE mg/dL
Specific Gravity, Urine: 1.009 (ref 1.005–1.030)
pH: 7 (ref 5.0–8.0)

## 2017-07-24 LAB — POCT PREGNANCY, URINE: Preg Test, Ur: NEGATIVE

## 2017-07-24 LAB — HCG, QUANTITATIVE, PREGNANCY: hCG, Beta Chain, Quant, S: 7 m[IU]/mL — ABNORMAL HIGH (ref <5)

## 2017-07-24 LAB — LIPASE, BLOOD: Lipase: 27 U/L (ref 11–51)

## 2017-07-24 MED ORDER — IOPAMIDOL (ISOVUE-300) INJECTION 61%
100.0000 mL | Freq: Once | INTRAVENOUS | Status: AC | PRN
  Administered 2017-07-24: 100 mL via INTRAVENOUS

## 2017-07-24 MED ORDER — OXYCODONE-ACETAMINOPHEN 5-325 MG PO TABS: 1.0000 | ORAL_TABLET | ORAL | 0 refills | Status: DC | PRN

## 2017-07-24 MED ORDER — POLYETHYLENE GLYCOL 3350 17 G PO PACK: 17.0000 g | PACK | Freq: Every day | ORAL | 0 refills | Status: DC

## 2017-07-24 MED ORDER — MORPHINE SULFATE (PF) 4 MG/ML IV SOLN
4.0000 mg | INTRAVENOUS | Status: DC | PRN
  Administered 2017-07-24: 4 mg via INTRAVENOUS
  Filled 2017-07-24: qty 1

## 2017-07-24 MED ORDER — PROMETHAZINE HCL 12.5 MG PO TABS: 12.5000 mg | ORAL_TABLET | Freq: Four times a day (QID) | ORAL | 0 refills | Status: DC | PRN

## 2017-07-24 MED ORDER — PROMETHAZINE HCL 25 MG/ML IJ SOLN
12.5000 mg | Freq: Four times a day (QID) | INTRAMUSCULAR | Status: DC | PRN
  Administered 2017-07-24: 12.5 mg via INTRAVENOUS
  Filled 2017-07-24: qty 1

## 2017-07-24 MED ORDER — SODIUM CHLORIDE 0.9 % IV BOLUS (SEPSIS)
1000.0000 mL | Freq: Once | INTRAVENOUS | Status: AC
  Administered 2017-07-24: 1000 mL via INTRAVENOUS

## 2017-07-24 NOTE — ED Provider Notes (Signed)
[facility] Emergency Department Provider Note    None    (approximate)  I have reviewed the triage vital signs and the nursing notes.   HISTORY  Chief Complaint Abdominal Pain    HPI Cheryl [name] is a 41 y.o. female who is currently undergoing chemotherapy for breast cancer status post double mastectomy. Recent elective cholecystectomy 2 weeks ago resenting with chief complaint of severe right-sided abdominal pain associated with nausea and vomiting. Has had chills but no measured fevers. Is also having trouble moving her bowels. Patient states the pain is 10 out of 10 in severity. No dysuria or increased urinary frequency.   Past Medical History:  Diagnosis Date  . Breast cancer ([place])    left breast  . Wears contact lenses    Family History  Problem Relation Age of Onset  . Lung cancer Maternal Grandmother 68       non smoker  . Lung cancer Maternal Grandfather 39       non smoker worked in [employer]  . Cancer Paternal Grandfather 35       throat cancer ? smoker  . Cancer Cousin 41       throat cancer ? smoker   Past Surgical History:  Procedure Laterality Date  . BILATERAL TOTAL MASTECTOMY WITH AXILLARY LYMPH NODE DISSECTION  04/20/2017  . BREAST BIOPSY Left 02/18/2015  . BREAST BIOPSY Right 02/18/2015  . BREAST EXCISIONAL BIOPSY Left 04/05/2015  . BREAST LUMPECTOMY Left 03/27/2015  . BREAST LUMPECTOMY WITH NEEDLE LOCALIZATION AND AXILLARY SENTINEL LYMPH NODE BX Left 03/27/2015   Procedure: LEFT BREAST LUMPECTOMY WITH BRACKETED NEEDLE LOCALIZATION AND LEFT  AXILLARY SENTINEL LYMPH NODE BX;  Surgeon: [name], MD;  Location: [place];  Service: General;  Laterality: Left;  . BREAST RECONSTRUCTION WITH PLACEMENT OF TISSUE EXPANDER AND FLEX HD (ACELLULAR HYDRATED DERMIS) Bilateral 04/20/2017   Procedure: BILATARAL BREAST RECONSTRUCTION WITH PLACEMENT OF TISSUE EXPANDER AND FLEX HD (ACELLULAR HYDRATED DERMIS);   Surgeon: Wallace Going, DO;  Location: [place];  Service: Plastics;  Laterality: Bilateral;  . BREAST SURGERY    . CHOLECYSTECTOMY N/A 07/07/2017   Procedure: LAPAROSCOPIC CHOLECYSTECTOMY WITH INTRAOPERATIVE CHOLANGIOGRAM;  Surgeon: [name], MD;  [facility];  Service: General;  Laterality: N/A;  . MASTECTOMY W/ SENTINEL NODE BIOPSY Bilateral 04/20/2017   Procedure: BILATERAL TOTAL MASTECTOMY WITH LEFT AXILLARY SENTINEL LYMPH NODE BIOPSY;  Surgeon: [name], MD;  Location: [place];  Service: General;  Laterality: Bilateral;  . PORTACATH PLACEMENT Right 04/25/2015   Procedure: INSERTION PORT-A-CATH;  Surgeon: [name], MD;  Location: [place];  Service: General;  Laterality: Right;  . PORTACATH PLACEMENT Right 04/20/2017   Procedure: INSERTION PORT-A-CATH;  Surgeon: [name], MD;  Location: [place];  Service: General;  Laterality: Right;  . RE-EXCISION OF BREAST LUMPECTOMY Left 04/05/2015   Procedure: RE-EXCISION LEFT  BREAST LUMPECTOMY;  Surgeon: [name], MD;  Location: [place];  Service: General;  Laterality: Left;   Patient Active Problem List   Diagnosis Date Noted  . Recurrent breast cancer, left ([place]) 03/18/2017  . Genetic testing 03/05/2016  . Chemotherapy-induced neuropathy ([place]) 07/11/2015  . Bronchitis 06/13/2015  . Flank pain 05/30/2015  . UTI (urinary tract infection) 05/16/2015  . Malignant neoplasm of upper-outer quadrant of left breast in female, estrogen receptor negative ([place]) 02/13/2015      Prior to Admission medications   Medication Sig Start Date End Date Taking? Authorizing Provider  cyclobenzaprine (FLEXERIL) 10 MG tablet Take 10 mg by mouth 3 (three) times daily as needed for muscle spasms.    [provider]  dexamethasone (DECADRON) 4 MG tablet Take 1 tablet twice a day with meals beginning the day after chemotherapy and continuing for 3 days Patient taking differently:  Take 4 mg by mouth See admin instructions. Take 1 tablet twice a day with meals beginning the day after chemotherapy and continuing for 3 days 05/28/17   Magrinat, Virgie Dad, MD  HYDROcodone-acetaminophen (NORCO/VICODIN) 5-325 MG tablet Take 1 tablet by mouth every 6 (six) hours as needed for moderate pain.    [provider]  ibuprofen (ADVIL,MOTRIN) 200 MG tablet Take 400 mg by mouth every 8 (eight) hours as needed for mild pain.    [provider]  lidocaine-prilocaine (EMLA) cream Apply to affected area once Patient taking differently: Apply 1 application topically daily as needed (prior to port being accessed).  05/07/17   Magrinat, Virgie Dad, MD  oxyCODONE-acetaminophen (PERCOCET/ROXICET) 5-325 MG tablet Take 1 tablet by mouth every 4 (four) hours as needed for severe pain. 07/07/17   [name], MD  prochlorperazine (COMPAZINE) 10 MG tablet Take 1 tablet (10 mg total) by mouth every 6 (six) hours as needed (Nausea or vomiting). 05/07/17   Magrinat, Virgie Dad, MD    Allergies Adhesive [tape]    Social History Social History  Substance Use Topics  . Smoking status: Never Smoker  . Smokeless tobacco: Never Used  . Alcohol use 0.0 oz/week     Comment: occ    Review of Systems Patient denies headaches, rhinorrhea, blurry vision, numbness, shortness of breath, chest pain, edema, cough, abdominal pain, nausea, vomiting, diarrhea, dysuria, fevers, rashes or hallucinations unless otherwise stated above in HPI. ____________________________________________   PHYSICAL EXAM:  VITAL SIGNS: Vitals:   07/24/17 1921  BP: (!) 176/120  Pulse: (!) 116  Resp: (!) 22  Temp: 98.6 F (37 C)  SpO2: 98%    Constitutional: Alert and oriented. Tearful but in no acute distress. Eyes: Conjunctivae are normal.  Head: Atraumatic. Nose: No congestion/rhinnorhea. Mouth/Throat: Mucous membranes are moist.   Neck: No stridor. Painless ROM.  Cardiovascular: Normal rate, regular  rhythm. Grossly normal heart sounds.  Good peripheral circulation. Respiratory: Normal respiratory effort.  No retractions. Lungs CTAB. Gastrointestinal: Soft with mild right sided ttp. No distention. No abdominal bruits. No CVA tenderness. Musculoskeletal: No lower extremity tenderness nor edema.  No joint effusions. Neurologic:  Normal speech and language. No gross focal neurologic deficits are appreciated. No facial droop Skin:  Skin is warm, dry and intact. No rash noted. Psychiatric: Speech and behavior are normal.  ____________________________________________   LABS (all labs ordered are listed, but only abnormal results are displayed)  No results found for this or any previous visit (from the past 24 hour(s)). ____________________________________________  EKG My review and personal interpretation at Time: 19:54   Indication: abdominal pain  Rate: 90  Rhythm: sinus Axis: normal Other: normal intervals, borderline prolonged qt ____________________________________________  RADIOLOGY  I personally reviewed all radiographic images ordered to evaluate for the above acute complaints and reviewed radiology reports and findings.  These findings were personally discussed with the patient.  Please see medical record for radiology report.  ____________________________________________   PROCEDURES  Procedure(s) performed:  Procedures    Critical Care performed: no ____________________________________________   INITIAL IMPRESSION / ASSESSMENT AND PLAN / ED COURSE  Pertinent labs & imaging results that were available during my care of the  patient were reviewed by me and considered in my medical decision making (see chart for details).  DDX: diverticulitis, appendicitis, stone, pyelo, constipation, sbo, pancreatitis  Cheryl [name] is a 42 y.o. who presents to the ED with severe right upper quadrant pain associated with nausea and vomiting status post cholecystectomy on  chemotherapy for breast cancer. Patient is mildly tachycardic but afebrile. Does have some mild tenderness but no peritonitis. We'll provide IV fluids as well as IV pain medication. Will order blood work as well as urinalysis, and CT imaging to evaluate for the above differential.  Clinical Course as of Jul 25 2323  Sat Jul 24, 2017  2319 Patient reassessed. Blood work is reassuring. CT imaging shows no evidence of acute process. Her repeat abdominal exam is soft and benign. I did discuss results including abnormality and liver found on CT scan. Patient is able to tolerate oral hydration and otherwise in no acute distress therefore do feel that she is appropriate for further workup and management as an outpatient.  [PR]    Clinical Course User Index [PR] Merlyn Lot, MD     ____________________________________________   FINAL CLINICAL IMPRESSION(S) / ED DIAGNOSES  Final diagnoses:  Right upper quadrant abdominal pain  Nausea      NEW MEDICATIONS STARTED DURING THIS VISIT:  New Prescriptions   No medications on file     Note:  This document was prepared using Dragon voice recognition software and may include unintentional dictation errors.    Merlyn Lot, MD 07/24/17 2325

## 2017-07-24 NOTE — ED Triage Notes (Signed)
Pt reports having her gallbladder removed x 2 weeks ago and a double mastectomy in May with lymph nodes removed on the left side. Pt is ambulatory at this time in triage and tearful. Pt reports new emesis today but states that she has been feeling pain and nausea since her surgery. Pt states that she still had some gallstones after procedure.

## 2017-07-24 NOTE — ED Notes (Signed)
ED Provider at bedside. 

## 2017-07-24 NOTE — Discharge Instructions (Signed)

## 2017-07-25 MED ORDER — OXYCODONE-ACETAMINOPHEN 5-325 MG PO TABS
2.0000 | ORAL_TABLET | Freq: Once | ORAL | Status: AC
  Administered 2017-07-25: 2 via ORAL
  Filled 2017-07-25: qty 2

## 2017-07-25 NOTE — ED Notes (Signed)

## 2017-07-26 ENCOUNTER — Telehealth: Payer: Self-pay | Admitting: Surgery

## 2017-07-26 DIAGNOSIS — K7689 Other specified diseases of liver: Secondary | ICD-10-CM | POA: Insufficient documentation

## 2017-07-26 NOTE — Telephone Encounter (Signed)
Cheryl [name]  Jul 08, 1976 [id number]  Patient Care Team: Practice, [facility] as PCP - General (Family Medicine) [name], MD as Consulting Physician (Obstetrics and Gynecology) [name], MD as Consulting Physician (General Surgery) Magrinat, Virgie Dad, MD as Consulting Physician (Oncology) [name], RN as Registered Nurse [name], RN as Registered Nurse Dillingham, [name], DO as Attending Physician (Plastic Surgery)  This patient is a 41 y.o.female who calls today for surgical evaluation.   Date of procedure/visit: 07/07/2017  Preoperative diagnosis: Cholelithiasis and cholecystitis  Postoperative diagnosis: Cholelithiasis and cholecystitis  Surgical procedure: Laparoscopic cholecystectomy with intraoperative cholangiogram  Surgeon: Marland Kitchen T. Hoxworth M.D.   Diagnosis Gallbladder - CHRONIC CHOLECYSTITIS AND CHOLELITHIASIS - NO MALIGNANCY IDENTIFIED DAWN BUTLER MD Pathologist, Electronic Signature (Case signed 07/08/2017)   Reason for call: Pain Breast cancer survivor s/p Bil MRM & implants May 2018 Lap chole 8/15  Patient called c/o RUQ/R sided pain "like before surgery."  X 3 days No pain on 8/31 Plastic surgery visit Temp 100.2 Nausea Tolerating sips only NO BM for 5 days - trying Miralax & Dulcolax Went to [facility] ED 9/1 - Labs & CT Scan WNL.  Liver lesion noted (but c/w FNH on preop recent MRI May 2018 per radiology).  Rx for oxycodone / phenergan Wants to visit with Dr [name]  Tomorrow  I noted it may not be BH, but reasonable to have her come in tomorrow Labs/CT scan OK 2 days ago Suspect constipation a factor - try enemas & supp since n/v with Miralax, etc  Add heat/ice.  Add ibuprofen or Aleve.  Increase oxycodone if needed If pain Go to ED if cannot keep anything down or pain uncontrolled - may need enema & IV meds    Patient Active Problem List   Diagnosis Date Noted  . Recurrent breast  cancer, left ([place]) 03/18/2017  . Genetic testing 03/05/2016  . Chemotherapy-induced neuropathy ([place]) 07/11/2015  . Bronchitis 06/13/2015  . Flank pain 05/30/2015  . UTI (urinary tract infection) 05/16/2015  . Malignant neoplasm of upper-outer quadrant of left breast in female, estrogen receptor negative ([place]) 02/13/2015    Past Medical History:  Diagnosis Date  . Breast cancer ([place])    left breast  . Wears contact lenses     Past Surgical History:  Procedure Laterality Date  . BILATERAL TOTAL MASTECTOMY WITH AXILLARY LYMPH NODE DISSECTION  04/20/2017  . BREAST BIOPSY Left 02/18/2015  . BREAST BIOPSY Right 02/18/2015  . BREAST EXCISIONAL BIOPSY Left 04/05/2015  . BREAST LUMPECTOMY Left 03/27/2015  . BREAST LUMPECTOMY WITH NEEDLE LOCALIZATION AND AXILLARY SENTINEL LYMPH NODE BX Left 03/27/2015   Procedure: LEFT BREAST LUMPECTOMY WITH BRACKETED NEEDLE LOCALIZATION AND LEFT  AXILLARY SENTINEL LYMPH NODE BX;  Surgeon: [name], MD;  Location: [place];  Service: General;  Laterality: Left;  . BREAST RECONSTRUCTION WITH PLACEMENT OF TISSUE EXPANDER AND FLEX HD (ACELLULAR HYDRATED DERMIS) Bilateral 04/20/2017   Procedure: BILATARAL BREAST RECONSTRUCTION WITH PLACEMENT OF TISSUE EXPANDER AND FLEX HD (ACELLULAR HYDRATED DERMIS);  Surgeon: Wallace Going, DO;  Location: [place];  Service: Plastics;  Laterality: Bilateral;  . BREAST SURGERY    . CHOLECYSTECTOMY N/A 07/07/2017   Procedure: LAPAROSCOPIC CHOLECYSTECTOMY WITH INTRAOPERATIVE CHOLANGIOGRAM;  Surgeon: [name], MD;  [facility];  Service: General;  Laterality: N/A;  . MASTECTOMY W/ SENTINEL NODE BIOPSY Bilateral 04/20/2017   Procedure: BILATERAL TOTAL MASTECTOMY WITH LEFT AXILLARY SENTINEL LYMPH NODE BIOPSY;  Surgeon: [name], MD;  Location:  [place] [facility];  Service: General;  Laterality: Bilateral;  . PORTACATH PLACEMENT Right 04/25/2015   Procedure: INSERTION PORT-A-CATH;  Surgeon: [name], MD;  Location: [place];  Service: General;  Laterality: Right;  . PORTACATH PLACEMENT Right 04/20/2017   Procedure: INSERTION PORT-A-CATH;  Surgeon: [name], MD;  Location: [place];  Service: General;  Laterality: Right;  . RE-EXCISION OF BREAST LUMPECTOMY Left 04/05/2015   Procedure: RE-EXCISION LEFT  BREAST LUMPECTOMY;  Surgeon: [name], MD;  Location: [place];  Service: General;  Laterality: Left;    Social History   Social History  . Marital status: Divorced    Spouse name: N/A  . Number of children: 3  . Years of education: N/A   Occupational History  . Not on file.   Social History Main Topics  . Smoking status: Never Smoker  . Smokeless tobacco: Never Used  . Alcohol use 0.0 oz/week     Comment: occ  . Drug use: No  . Sexual activity: Yes   Other Topics Concern  . Not on file   Social History Narrative  . No narrative on file    Family History  Problem Relation Age of Onset  . Lung cancer Maternal Grandmother 38       non smoker  . Lung cancer Maternal Grandfather 65       non smoker worked in [employer]  . Cancer Paternal Grandfather 35       throat cancer ? smoker  . Cancer Cousin 41       throat cancer ? smoker    Current Outpatient Prescriptions  Medication Sig Dispense Refill  . cyclobenzaprine (FLEXERIL) 10 MG tablet Take 10 mg by mouth 3 (three) times daily as needed for muscle spasms.    Marland Kitchen dexamethasone (DECADRON) 4 MG tablet Take 1 tablet twice a day with meals beginning the day after chemotherapy and continuing for 3 days (Patient taking differently: Take 4 mg by mouth See admin instructions. Take 1 tablet twice a day with meals beginning the day after chemotherapy and continuing for 3 days) 50 tablet 1  . HYDROcodone-acetaminophen (NORCO/VICODIN) 5-325 MG tablet Take 1 tablet by mouth every 6 (six) hours as needed for moderate pain.    Marland Kitchen ibuprofen (ADVIL,MOTRIN) 200 MG tablet Take 400 mg  by mouth every 8 (eight) hours as needed for mild pain.    Marland Kitchen lidocaine-prilocaine (EMLA) cream Apply to affected area once (Patient taking differently: Apply 1 application topically daily as needed (prior to port being accessed). ) 30 g 3  . oxyCODONE-acetaminophen (PERCOCET/ROXICET) 5-325 MG tablet Take 1 tablet by mouth every 4 (four) hours as needed for severe pain. 8 tablet 0  . polyethylene glycol (MIRALAX / GLYCOLAX) packet Take 17 g by mouth daily. Mix one tablespoon with 8oz of your favorite juice or water every day until you are having soft formed stools. Then start taking once daily if you didn't have a stool the day before. 30 each 0  . prochlorperazine (COMPAZINE) 10 MG tablet Take 1 tablet (10 mg total) by mouth every 6 (six) hours as needed (Nausea or vomiting). 30 tablet 1  . promethazine (PHENERGAN) 12.5 MG tablet Take 1 tablet (12.5 mg total) by mouth every 6 (six) hours as needed for nausea or vomiting. 12 tablet 0   No current facility-administered medications for this visit.      Allergies  Allergen Reactions  . Adhesive [Tape] Other (See Comments)    Skin  blisters    @VS @  Dg Cholangiogram Operative  Result Date: 07/07/2017 CLINICAL DATA:  Gallstones EXAM: INTRAOPERATIVE CHOLANGIOGRAM TECHNIQUE: Cholangiographic images from the C-arm fluoroscopic device were submitted for interpretation post-operatively. Please see the procedural report for the amount of contrast and the fluoroscopy time utilized. COMPARISON:  None. FINDINGS: Contrast fills the biliary tree and duodenum without filling defects in the common bile duct. IMPRESSION: Patent biliary tree. Electronically Signed   By: Marybelle Killings M.D.   On: 07/07/2017 11:53   Ct Abdomen Pelvis W Contrast  Result Date: 07/24/2017 CLINICAL DATA:  41 y/o F; history of cholecystectomy 2 weeks ago presenting with abdominal pain and fever. EXAM: CT ABDOMEN AND PELVIS WITH CONTRAST TECHNIQUE: Multidetector CT imaging of the abdomen and  pelvis was performed using the standard protocol following bolus administration of intravenous contrast. CONTRAST:  149mL ISOVUE-300 IOPAMIDOL (ISOVUE-300) INJECTION 61% COMPARISON:  None. FINDINGS: Lower chest: Bilateral breast prostheses. Hepatobiliary: Hepatic steatosis. 2.2 x 2.4 cm (AP by ML series 2, image 21) largely homogeneously enhancing mass with central hypoenhancement on portal venous phase in segment 4A of the liver. Status post cholecystectomy. There is minimal fat stranding within the gallbladder bed compatible with recent surgery, no fluid collection is identified. No intra or extrahepatic biliary ductal dilatation. Pancreas: Unremarkable. No pancreatic ductal dilatation or surrounding inflammatory changes. Spleen: Normal in size without focal abnormality. Adrenals/Urinary Tract: Adrenal glands are unremarkable. Kidneys are normal, without renal calculi, focal lesion, or hydronephrosis. Bladder is unremarkable. Stomach/Bowel: Stomach is within normal limits. Appendix appears normal. No evidence of bowel wall thickening, distention, or inflammatory changes. Vascular/Lymphatic: Aortic atherosclerosis. No enlarged abdominal or pelvic lymph nodes. Reproductive: Uterus and bilateral adnexa are unremarkable. Essure devices. Other: No abdominal wall hernia or abnormality. No abdominopelvic ascites. Musculoskeletal: No acute or significant osseous findings. IMPRESSION: 1. Indeterminate liver mass the in segment 4A of the liver measuring up to 2.4 cm. Further characterization with prompt liver MRI is recommended. This recommendation follows ACR consensus guidelines: Management of Incidental Liver Lesions on CT: A White Paper of the ACR Incidental Findings Committee. J Am Coll Radiol 2017; 75:1025-8527. 2. Faint edema in the gallbladder fossa consistent with recent postsurgical change. No abscess identified. 3. Hepatic steatosis. 4. Mild aortic atherosclerosis. Electronically Signed   By: Kristine Garbe M.D.   On: 07/24/2017 23:08   US Abdomen Limited Ruq  Result Date: 06/29/2017 CLINICAL DATA:  Right upper quadrant abdominal pain for 7 hours. Personal history of breast cancer. EXAM: ULTRASOUND ABDOMEN LIMITED RIGHT UPPER QUADRANT COMPARISON:  MRI of the abdomen 04/06/2017 FINDINGS: Gallbladder: Multiple shadowing gallstones are present. The largest stone measures 7 mm. Pericholecystic edema or fluid is noted. The gallbladder wall is thickened at 4.1 mm. No sonographic Percell Miller sign is reported. Common bile duct: Diameter: 4.5 mm, within normal limits. Liver: The liver is diffusely hyperechoic. A focal lesion within the left lobe of the liver measures 3.3 x 2.9 x 2.5 cm. This corresponds with the area of focal nodular hyperplasia identified on the recent MRI scan. No other discrete lesions are present. IMPRESSION: 1. Gallbladder wall thickening and cholelithiasis is suggestive of acute cholecystitis. 2. Hepatic steatosis. 3. Focal hypoechoic lesion compatible with FNH as described on recent MRI. Electronically Signed   By: San Morelle M.D.   On: 06/29/2017 07:39    Note: This dictation was prepared with Dragon/digital dictation along with [employer]. Any transcriptional errors that result from this process are unintentional.   .Adin Hector, M.D., F.A.C.S. Gastrointestinal  and Minimally Invasive Surgery [facility] [address], [place], [place] [address] [phone] Main / Paging  07/26/2017 8:32 PM

## 2017-07-27 ENCOUNTER — Other Ambulatory Visit: Payer: Self-pay | Admitting: Oncology

## 2017-07-27 DIAGNOSIS — C50912 Malignant neoplasm of unspecified site of left female breast: Secondary | ICD-10-CM

## 2017-07-27 DIAGNOSIS — Z171 Estrogen receptor negative status [ER-]: Principal | ICD-10-CM

## 2017-07-27 DIAGNOSIS — C50412 Malignant neoplasm of upper-outer quadrant of left female breast: Secondary | ICD-10-CM

## 2017-07-27 NOTE — Progress Notes (Unsigned)
I called the Benadryl and gave her the results of her CT of the abdomen and pelvis. This showed a 2.4 cm hepatic lesion in the setting of steatosis. This requires further evaluation. [name] being scheduled for an MRI of the liver ASAP.

## 2017-07-28 ENCOUNTER — Encounter: Payer: Self-pay | Admitting: Adult Health

## 2017-07-29 ENCOUNTER — Other Ambulatory Visit: Payer: Self-pay

## 2017-07-29 ENCOUNTER — Telehealth: Payer: Self-pay

## 2017-07-29 DIAGNOSIS — C50412 Malignant neoplasm of upper-outer quadrant of left female breast: Secondary | ICD-10-CM

## 2017-07-29 DIAGNOSIS — Z171 Estrogen receptor negative status [ER-]: Principal | ICD-10-CM

## 2017-07-29 DIAGNOSIS — C50912 Malignant neoplasm of unspecified site of left female breast: Secondary | ICD-10-CM

## 2017-07-29 NOTE — Telephone Encounter (Signed)
Pt lvm inquiring about appt for MRI ordered by Dr Jana Hakim.  Scheduling dept notified and asked to call pt to schedule appt.

## 2017-07-30 ENCOUNTER — Other Ambulatory Visit (HOSPITAL_BASED_OUTPATIENT_CLINIC_OR_DEPARTMENT_OTHER): Payer: 59

## 2017-07-30 ENCOUNTER — Ambulatory Visit (HOSPITAL_BASED_OUTPATIENT_CLINIC_OR_DEPARTMENT_OTHER): Payer: 59

## 2017-07-30 VITALS — BP 118/93 | HR 82 | Temp 98.7°F | Resp 18

## 2017-07-30 DIAGNOSIS — Z5111 Encounter for antineoplastic chemotherapy: Secondary | ICD-10-CM | POA: Diagnosis not present

## 2017-07-30 DIAGNOSIS — C50912 Malignant neoplasm of unspecified site of left female breast: Secondary | ICD-10-CM

## 2017-07-30 DIAGNOSIS — C50412 Malignant neoplasm of upper-outer quadrant of left female breast: Secondary | ICD-10-CM

## 2017-07-30 DIAGNOSIS — T451X5A Adverse effect of antineoplastic and immunosuppressive drugs, initial encounter: Secondary | ICD-10-CM

## 2017-07-30 DIAGNOSIS — G620 Drug-induced polyneuropathy: Secondary | ICD-10-CM

## 2017-07-30 DIAGNOSIS — Z171 Estrogen receptor negative status [ER-]: Secondary | ICD-10-CM

## 2017-07-30 LAB — CBC WITH DIFFERENTIAL/PLATELET
BASO%: 0.6 % (ref 0.0–2.0)
Basophils Absolute: 0 10*3/uL (ref 0.0–0.1)
EOS%: 3.3 % (ref 0.0–7.0)
Eosinophils Absolute: 0.2 10*3/uL (ref 0.0–0.5)
HCT: 37 % (ref 34.8–46.6)
HGB: 11.9 g/dL (ref 11.6–15.9)
LYMPH%: 24.6 % (ref 14.0–49.7)
MCH: 28.5 pg (ref 25.1–34.0)
MCHC: 32.2 g/dL (ref 31.5–36.0)
MCV: 88.5 fL (ref 79.5–101.0)
MONO#: 0.4 10*3/uL (ref 0.1–0.9)
MONO%: 5.8 % (ref 0.0–14.0)
NEUT#: 4.4 10*3/uL (ref 1.5–6.5)
NEUT%: 65.7 % (ref 38.4–76.8)
Platelets: 378 10*3/uL (ref 145–400)
RBC: 4.18 10*6/uL (ref 3.70–5.45)
RDW: 15.7 % — ABNORMAL HIGH (ref 11.2–14.5)
WBC: 6.7 10*3/uL (ref 3.9–10.3)
lymph#: 1.7 10*3/uL (ref 0.9–3.3)

## 2017-07-30 LAB — DRAW EXTRA CLOT TUBE

## 2017-07-30 LAB — COMPREHENSIVE METABOLIC PANEL
ALK PHOS: 207 U/L — AB (ref 40–150)
ALT: 164 U/L — AB (ref 0–55)
AST: 65 U/L — AB (ref 5–34)
Albumin: 3.5 g/dL (ref 3.5–5.0)
Anion Gap: 11 mEq/L (ref 3–11)
BUN: 9 mg/dL (ref 7.0–26.0)
CHLORIDE: 106 meq/L (ref 98–109)
CO2: 24 meq/L (ref 22–29)
CREATININE: 0.8 mg/dL (ref 0.6–1.1)
Calcium: 9.8 mg/dL (ref 8.4–10.4)
EGFR: >90 mL/min/{1.73_m2} (ref >90)
Glucose: 153 mg/dl — ABNORMAL HIGH (ref 70–140)
Potassium: 3.8 mEq/L (ref 3.5–5.1)
SODIUM: 141 meq/L (ref 136–145)
Total Bilirubin: 1.06 mg/dL (ref 0.20–1.20)
Total Protein: 7.6 g/dL (ref 6.4–8.3)

## 2017-07-30 MED ORDER — HEPARIN SOD (PORK) LOCK FLUSH 100 UNIT/ML IV SOLN
500.0000 [IU] | Freq: Once | INTRAVENOUS | Status: AC | PRN
  Administered 2017-07-30: 500 [IU]
  Filled 2017-07-30: qty 5

## 2017-07-30 MED ORDER — METHOTREXATE SODIUM (PF) CHEMO INJECTION 250 MG/10ML
40.0000 mg/m2 | Freq: Once | INTRAMUSCULAR | Status: AC
Start: 2017-07-30 — End: 2017-07-30
  Administered 2017-07-30: 82 mg via INTRAVENOUS
  Filled 2017-07-30: qty 3.28

## 2017-07-30 MED ORDER — PALONOSETRON HCL INJECTION 0.25 MG/5ML
0.2500 mg | Freq: Once | INTRAVENOUS | Status: AC
  Administered 2017-07-30: 0.25 mg via INTRAVENOUS

## 2017-07-30 MED ORDER — SODIUM CHLORIDE 0.9% FLUSH
10.0000 mL | INTRAVENOUS | Status: DC | PRN
  Administered 2017-07-30: 10 mL
  Filled 2017-07-30: qty 10

## 2017-07-30 MED ORDER — SODIUM CHLORIDE 0.9 % IV SOLN
Freq: Once | INTRAVENOUS | Status: AC
  Administered 2017-07-30: 11:00:00 via INTRAVENOUS

## 2017-07-30 MED ORDER — DEXAMETHASONE SODIUM PHOSPHATE 10 MG/ML IJ SOLN
10.0000 mg | Freq: Once | INTRAMUSCULAR | Status: AC
  Administered 2017-07-30: 10 mg via INTRAVENOUS

## 2017-07-30 MED ORDER — FLUOROURACIL CHEMO INJECTION 2.5 GM/50ML
600.0000 mg/m2 | Freq: Once | INTRAVENOUS | Status: AC
  Administered 2017-07-30: 1250 mg via INTRAVENOUS
  Filled 2017-07-30: qty 25

## 2017-07-30 MED ORDER — SODIUM CHLORIDE 0.9 % IV SOLN
600.0000 mg/m2 | Freq: Once | INTRAVENOUS | Status: AC
  Administered 2017-07-30: 1240 mg via INTRAVENOUS
  Filled 2017-07-30: qty 62

## 2017-07-30 MED ORDER — DEXAMETHASONE SODIUM PHOSPHATE 10 MG/ML IJ SOLN
INTRAMUSCULAR | Status: AC
  Filled 2017-07-30: qty 1

## 2017-07-30 MED ORDER — PALONOSETRON HCL INJECTION 0.25 MG/5ML
INTRAVENOUS | Status: AC
  Filled 2017-07-30: qty 5

## 2017-07-30 NOTE — Progress Notes (Signed)
Per Dr Jana Hakim it is okay to treat pt today with chemo and todays ALT .

## 2017-07-30 NOTE — Patient Instructions (Signed)
[facility] Discharge Instructions for Patients Receiving Chemotherapy  Today you received the following chemotherapy agents Cytoxan , methotrexate and 20fu To help prevent nausea and vomiting after your treatment, we encourage you to take your nausea medication   If you develop nausea and vomiting that is not controlled by your nausea medication, call the clinic.   BELOW ARE SYMPTOMS THAT SHOULD BE REPORTED IMMEDIATELY:  *FEVER GREATER THAN 100.5 F  *CHILLS WITH OR WITHOUT FEVER  NAUSEA AND VOMITING THAT IS NOT CONTROLLED WITH YOUR NAUSEA MEDICATION  *UNUSUAL SHORTNESS OF BREATH  *UNUSUAL BRUISING OR BLEEDING  TENDERNESS IN MOUTH AND THROAT WITH OR WITHOUT PRESENCE OF ULCERS  *URINARY PROBLEMS  *BOWEL PROBLEMS  UNUSUAL RASH Items with * indicate a potential emergency and should be followed up as soon as possible.  Feel free to call the clinic you have any questions or concerns. The clinic phone number is [phone].  Please show the [place] at check-in to the Emergency Department and triage nurse.

## 2017-08-01 ENCOUNTER — Other Ambulatory Visit: Payer: Self-pay | Admitting: Oncology

## 2017-08-02 ENCOUNTER — Telehealth: Payer: Self-pay | Admitting: Oncology

## 2017-08-02 NOTE — Telephone Encounter (Signed)
Left patient a voicemail regarding the slight shift in her appts. Sending her a confirmation letter as well.

## 2017-08-03 ENCOUNTER — Ambulatory Visit (HOSPITAL_COMMUNITY)
Admission: RE | Admit: 2017-08-03 | Discharge: 2017-08-03 | Disposition: A | Discharge status: Home / Self Care | Payer: 59 | Source: Ambulatory Visit | Attending: Oncology | Admitting: Oncology

## 2017-08-03 DIAGNOSIS — C50412 Malignant neoplasm of upper-outer quadrant of left female breast: Secondary | ICD-10-CM

## 2017-08-03 DIAGNOSIS — Z171 Estrogen receptor negative status [ER-]: Principal | ICD-10-CM

## 2017-08-03 DIAGNOSIS — C50912 Malignant neoplasm of unspecified site of left female breast: Secondary | ICD-10-CM

## 2017-08-12 ENCOUNTER — Ambulatory Visit: Payer: 59

## 2017-08-12 ENCOUNTER — Other Ambulatory Visit: Payer: 59

## 2017-08-12 ENCOUNTER — Ambulatory Visit: Payer: 59 | Admitting: Oncology

## 2017-08-13 DIAGNOSIS — Z170 Estrogen receptor positive status [ER+]: Secondary | ICD-10-CM | POA: Diagnosis not present

## 2017-08-13 DIAGNOSIS — Z9013 Acquired absence of bilateral breasts and nipples: Secondary | ICD-10-CM | POA: Diagnosis not present

## 2017-08-13 DIAGNOSIS — C50412 Malignant neoplasm of upper-outer quadrant of left female breast: Secondary | ICD-10-CM | POA: Diagnosis not present

## 2017-08-18 ENCOUNTER — Other Ambulatory Visit: Payer: Self-pay

## 2017-08-18 DIAGNOSIS — C50412 Malignant neoplasm of upper-outer quadrant of left female breast: Secondary | ICD-10-CM

## 2017-08-18 DIAGNOSIS — Z171 Estrogen receptor negative status [ER-]: Principal | ICD-10-CM

## 2017-08-19 ENCOUNTER — Other Ambulatory Visit (HOSPITAL_BASED_OUTPATIENT_CLINIC_OR_DEPARTMENT_OTHER): Payer: Federal, State, Local not specified - PPO

## 2017-08-19 ENCOUNTER — Ambulatory Visit (HOSPITAL_BASED_OUTPATIENT_CLINIC_OR_DEPARTMENT_OTHER): Payer: Federal, State, Local not specified - PPO | Admitting: Adult Health

## 2017-08-19 ENCOUNTER — Encounter: Payer: Self-pay | Admitting: Adult Health

## 2017-08-19 ENCOUNTER — Ambulatory Visit: Payer: Federal, State, Local not specified - PPO

## 2017-08-19 ENCOUNTER — Other Ambulatory Visit: Payer: Self-pay | Admitting: Adult Health

## 2017-08-19 ENCOUNTER — Ambulatory Visit (HOSPITAL_BASED_OUTPATIENT_CLINIC_OR_DEPARTMENT_OTHER): Payer: 59

## 2017-08-19 VITALS — BP 136/86 | HR 91 | Temp 98.5°F | Resp 18 | Ht 61.0 in | Wt 213.7 lb

## 2017-08-19 DIAGNOSIS — T451X5A Adverse effect of antineoplastic and immunosuppressive drugs, initial encounter: Secondary | ICD-10-CM

## 2017-08-19 DIAGNOSIS — C50512 Malignant neoplasm of lower-outer quadrant of left female breast: Secondary | ICD-10-CM

## 2017-08-19 DIAGNOSIS — C50412 Malignant neoplasm of upper-outer quadrant of left female breast: Secondary | ICD-10-CM

## 2017-08-19 DIAGNOSIS — G620 Drug-induced polyneuropathy: Secondary | ICD-10-CM

## 2017-08-19 DIAGNOSIS — Z5111 Encounter for antineoplastic chemotherapy: Secondary | ICD-10-CM | POA: Diagnosis not present

## 2017-08-19 DIAGNOSIS — Z171 Estrogen receptor negative status [ER-]: Secondary | ICD-10-CM | POA: Diagnosis not present

## 2017-08-19 DIAGNOSIS — Z95828 Presence of other vascular implants and grafts: Secondary | ICD-10-CM

## 2017-08-19 DIAGNOSIS — C50912 Malignant neoplasm of unspecified site of left female breast: Secondary | ICD-10-CM

## 2017-08-19 DIAGNOSIS — Z853 Personal history of malignant neoplasm of breast: Secondary | ICD-10-CM

## 2017-08-19 DIAGNOSIS — C773 Secondary and unspecified malignant neoplasm of axilla and upper limb lymph nodes: Secondary | ICD-10-CM

## 2017-08-19 LAB — CBC WITH DIFFERENTIAL/PLATELET
BASO%: 1.5 % (ref 0.0–2.0)
BASOS ABS: 0.1 10*3/uL (ref 0.0–0.1)
EOS ABS: 0.1 10*3/uL (ref 0.0–0.5)
EOS%: 2.9 % (ref 0.0–7.0)
HCT: 34.3 % — ABNORMAL LOW (ref 34.8–46.6)
HGB: 11.5 g/dL — ABNORMAL LOW (ref 11.6–15.9)
LYMPH%: 36.8 % (ref 14.0–49.7)
MCH: 28.6 pg (ref 25.1–34.0)
MCHC: 33.4 g/dL (ref 31.5–36.0)
MCV: 85.5 fL (ref 79.5–101.0)
MONO#: 0.5 10*3/uL (ref 0.1–0.9)
MONO%: 10.6 % (ref 0.0–14.0)
NEUT#: 2.1 10*3/uL (ref 1.5–6.5)
NEUT%: 48.2 % (ref 38.4–76.8)
Platelets: 319 10*3/uL (ref 145–400)
RBC: 4.01 10*6/uL (ref 3.70–5.45)
RDW: 16.3 % — AB (ref 11.2–14.5)
WBC: 4.4 10*3/uL (ref 3.9–10.3)
lymph#: 1.6 10*3/uL (ref 0.9–3.3)

## 2017-08-19 LAB — COMPREHENSIVE METABOLIC PANEL
ALK PHOS: 105 U/L (ref 40–150)
ALT: 30 U/L (ref 0–55)
AST: 27 U/L (ref 5–34)
Albumin: 3.4 g/dL — ABNORMAL LOW (ref 3.5–5.0)
Anion Gap: 9 mEq/L (ref 3–11)
BUN: 9 mg/dL (ref 7.0–26.0)
CHLORIDE: 108 meq/L (ref 98–109)
CO2: 25 mEq/L (ref 22–29)
Calcium: 9.4 mg/dL (ref 8.4–10.4)
Creatinine: 0.8 mg/dL (ref 0.6–1.1)
GLUCOSE: 147 mg/dL — AB (ref 70–140)
POTASSIUM: 3.5 meq/L (ref 3.5–5.1)
SODIUM: 142 meq/L (ref 136–145)
Total Bilirubin: 0.76 mg/dL (ref 0.20–1.20)
Total Protein: 6.8 g/dL (ref 6.4–8.3)

## 2017-08-19 MED ORDER — SODIUM CHLORIDE 0.9 % IV SOLN
Freq: Once | INTRAVENOUS | Status: AC
  Administered 2017-08-19: 11:00:00 via INTRAVENOUS

## 2017-08-19 MED ORDER — SODIUM CHLORIDE 0.9% FLUSH
10.0000 mL | INTRAVENOUS | Status: DC | PRN
  Administered 2017-08-19: 10 mL
  Filled 2017-08-19: qty 10

## 2017-08-19 MED ORDER — SODIUM CHLORIDE 0.9% FLUSH
10.0000 mL | INTRAVENOUS | Status: DC | PRN
  Administered 2017-08-19: 10 mL via INTRAVENOUS
  Filled 2017-08-19: qty 10

## 2017-08-19 MED ORDER — PALONOSETRON HCL INJECTION 0.25 MG/5ML
INTRAVENOUS | Status: AC
  Filled 2017-08-19: qty 5

## 2017-08-19 MED ORDER — METHOTREXATE SODIUM (PF) CHEMO INJECTION 250 MG/10ML
40.0000 mg/m2 | Freq: Once | INTRAMUSCULAR | Status: AC
  Administered 2017-08-19: 82 mg via INTRAVENOUS
  Filled 2017-08-19: qty 3.28

## 2017-08-19 MED ORDER — DEXAMETHASONE SODIUM PHOSPHATE 10 MG/ML IJ SOLN
INTRAMUSCULAR | Status: AC
  Filled 2017-08-19: qty 1

## 2017-08-19 MED ORDER — PALONOSETRON HCL INJECTION 0.25 MG/5ML
0.2500 mg | Freq: Once | INTRAVENOUS | Status: AC
  Administered 2017-08-19: 0.25 mg via INTRAVENOUS

## 2017-08-19 MED ORDER — HEPARIN SOD (PORK) LOCK FLUSH 100 UNIT/ML IV SOLN
500.0000 [IU] | Freq: Once | INTRAVENOUS | Status: AC | PRN
  Administered 2017-08-19: 500 [IU]
  Filled 2017-08-19: qty 5

## 2017-08-19 MED ORDER — FLUOROURACIL CHEMO INJECTION 2.5 GM/50ML
600.0000 mg/m2 | Freq: Once | INTRAVENOUS | Status: AC
  Administered 2017-08-19: 1250 mg via INTRAVENOUS
  Filled 2017-08-19: qty 25

## 2017-08-19 MED ORDER — DEXAMETHASONE SODIUM PHOSPHATE 10 MG/ML IJ SOLN
10.0000 mg | Freq: Once | INTRAMUSCULAR | Status: AC
  Administered 2017-08-19: 10 mg via INTRAVENOUS

## 2017-08-19 MED ORDER — SODIUM CHLORIDE 0.9 % IV SOLN
600.0000 mg/m2 | Freq: Once | INTRAVENOUS | Status: AC
  Administered 2017-08-19: 1240 mg via INTRAVENOUS
  Filled 2017-08-19: qty 62

## 2017-08-19 NOTE — Patient Instructions (Signed)
[facility] Discharge Instructions for Patients Receiving Chemotherapy  Today you received the following chemotherapy agents Cytoxan, Methotrexate, Adrucil  To help prevent nausea and vomiting after your treatment, we encourage you to take your nausea medication   If you develop nausea and vomiting that is not controlled by your nausea medication, call the clinic.   BELOW ARE SYMPTOMS THAT SHOULD BE REPORTED IMMEDIATELY:  *FEVER GREATER THAN 100.5 F  *CHILLS WITH OR WITHOUT FEVER  NAUSEA AND VOMITING THAT IS NOT CONTROLLED WITH YOUR NAUSEA MEDICATION  *UNUSUAL SHORTNESS OF BREATH  *UNUSUAL BRUISING OR BLEEDING  TENDERNESS IN MOUTH AND THROAT WITH OR WITHOUT PRESENCE OF ULCERS  *URINARY PROBLEMS  *BOWEL PROBLEMS  UNUSUAL RASH Items with * indicate a potential emergency and should be followed up as soon as possible.  Feel free to call the clinic you have any questions or concerns. The clinic phone number is [phone].  Please show the [place] at check-in to the Emergency Department and triage nurse.

## 2017-08-19 NOTE — Patient Instructions (Signed)
Implanted Port Home Guide An implanted port is a type of central line that is placed under the skin. Central lines are used to provide IV access when treatment or nutrition needs to be given through a person's veins. Implanted ports are used for long-term IV access. An implanted port may be placed because:  You need IV medicine that would be irritating to the small veins in your hands or arms.  You need long-term IV medicines, such as antibiotics.  You need IV nutrition for a long period.  You need frequent blood draws for lab tests.  You need dialysis.  Implanted ports are usually placed in the chest area, but they can also be placed in the upper arm, the abdomen, or the leg. An implanted port has two main parts:  Reservoir. The reservoir is round and will appear as a small, raised area under your skin. The reservoir is the part where a needle is inserted to give medicines or draw blood.  Catheter. The catheter is a thin, flexible tube that extends from the reservoir. The catheter is placed into a large vein. Medicine that is inserted into the reservoir goes into the catheter and then into the vein.  How will I care for my incision site? Do not get the incision site wet. Bathe or shower as directed by your health care provider. How is my port accessed? Special steps must be taken to access the port:  Before the port is accessed, a numbing cream can be placed on the skin. This helps numb the skin over the port site.  Your health care provider uses a sterile technique to access the port. ? Your health care provider must put on a mask and sterile gloves. ? The skin over your port is cleaned carefully with an antiseptic and allowed to dry. ? The port is gently pinched between sterile gloves, and a needle is inserted into the port.  Only "non-coring" port needles should be used to access the port. Once the port is accessed, a blood return should be checked. This helps ensure that the port  is in the vein and is not clogged.  If your port needs to remain accessed for a constant infusion, a clear (transparent) bandage will be placed over the needle site. The bandage and needle will need to be changed every week, or as directed by your health care provider.  Keep the bandage covering the needle clean and dry. Do not get it wet. Follow your health care provider's instructions on how to take a shower or bath while the port is accessed.  If your port does not need to stay accessed, no bandage is needed over the port.  What is flushing? Flushing helps keep the port from getting clogged. Follow your health care provider's instructions on how and when to flush the port. Ports are usually flushed with saline solution or a medicine called heparin. The need for flushing will depend on how the port is used.  If the port is used for intermittent medicines or blood draws, the port will need to be flushed: ? After medicines have been given. ? After blood has been drawn. ? As part of routine maintenance.  If a constant infusion is running, the port may not need to be flushed.  How long will my port stay implanted? The port can stay in for as long as your health care provider thinks it is needed. When it is time for the port to come out, surgery will be   done to remove it. The procedure is similar to the one performed when the port was put in. When should I seek immediate medical care? When you have an implanted port, you should seek immediate medical care if:  You notice a bad smell coming from the incision site.  You have swelling, redness, or drainage at the incision site.  You have more swelling or pain at the port site or the surrounding area.  You have a fever that is not controlled with medicine.  This information is not intended to replace advice given to you by your health care provider. Make sure you discuss any questions you have with your health care provider. Document  Released: 11/09/2005 Document Revised: 04/16/2016 Document Reviewed: 07/17/2013 Elsevier Interactive Patient Education  2017 Elsevier Inc.  

## 2017-08-19 NOTE — Progress Notes (Signed)
Cheryl [name]  [phone] Fax:([phone]) [phone]     ID: Cheryl [name] DOB: 27-Mar-1976  MR#: [id number]  GEZ#:[id number]  Patient Care Team: [facility], [facility] as PCP - General (Family Medicine) [name], MD as Consulting Physician (Obstetrics and Gynecology) [name], MD as Consulting Physician (General Surgery) Magrinat, Virgie Dad, MD as Consulting Physician (Oncology) [name], RN as Registered Nurse [name], RN as Registered Nurse Dillingham, [name], DO as Attending Physician (Plastic Surgery) PCP: [facility], [facility] OTHER MD:  CHIEF COMPLAINT: Recurrent triple negative breast cancer  CURRENT TREATMENT: Adjuvant chemotherapy   INTERVAL HISTORY:   Cheryl [name] is here today for evaluation prior to receiving cycle four of CMF chemotherapy.  This is her second cycle after her cholecystectomy.  [name] tolerating the treatment well and is without any questions or concerns today.    REVIEW OF SYSTEMS: Gwenevere denies any vision changes, nausea, vomiting, mucositis, headaches or any other concerns today.  A detailed ROS is non contributory.     HISTORY OF RECURRENT DISEASE: From my 03/18/2017 note:  Cheryl [name] that he'll returns today for follow-up of her triple negative breast cancer, which is now recurrent. She had routine bilateral diagnostic mammography at the [facility] 03/10/2017 and this showed the breast density to be category C. The old lumpectomy site, which was in the upper outer quadrant, appears stable. However there was a new area of asymmetry in the lower inner quadrant of the left breast. Ultrasound confirmed this to be a 1.0 cm mass at the 7:00 radiant 5 cm from the nipple. Aspiration was attempted but no fluid was aspirated and biopsy of this mass was obtained 03/05/2017. This showed ([id number]) invasive mammary carcinoma, grade 2, estrogen and progesterone receptor negative,  HER-2 not amplified with a signals ratio 1.17 and a number per cell of 2.45, with an MIB-1 of 15%.  [name] here today to discuss subsequent management   BREAST CANCER HISTORY: From the original intake note:  The patient had screening mammography 02/01/2015 which showed a calcifications in the upper outer quadrant of the left breast. On 02/08/2015 she underwent left diagnostic mammography with tomosynthesis and left breast ultrasonography at the breast Center. The breast density was category C. Mammography confirmed a group of pleomorphic calcifications spanning 4.2 cm maximally. Physical examination of the upper outer quadrant of the left breast showed an area of thickening at the approximate 2:00 position. Ultrasound of this area showed no discrete masses. There was vague shadowing present. Calcifications could not be clearly identified sonographically. There was no lymphadenopathy in the left axilla.  On the same day the patient underwent biopsy of the left breast area in question, with the pathology (S [id number]) showing ductal carcinoma in situ, grade 2 or 3, with possible areas of microinvasion, the cancer cells being strongly estrogen and progesterone receptor positive, both at 100%.  On 02/15/2015 the patient underwent bilateral breast MRI. This showed the area of malignancy in the upper outer quadrant to measure 4.5 cm, including a large area of non-masslike enhancement and a 1 cm spiculated mass. There was also an indeterminate oval mass in the central left breast and another indeterminant mass in the slightly outer right breast anteriorly. Biopsy of the right breast mass 02/18/2015 ([id number]) showed a fibroadenoma, with no evidence of malignancy. Biopsy of 2 additional areas of the left breast (at 10:00 and 2:00) showed a fibroadenoma in the upper inner quadrant, and pseudo-angiomatous stromal hyperplasia (  PA SH) at the 2:00 area of the left breast.  The patient's subsequent history is  as detailed below.   PAST MEDICAL HISTORY: Past Medical History:  Diagnosis Date  . Breast cancer ([place])    left breast  . Wears contact lenses     PAST SURGICAL HISTORY: Past Surgical History:  Procedure Laterality Date  . BILATERAL TOTAL MASTECTOMY WITH AXILLARY LYMPH NODE DISSECTION  04/20/2017  . BREAST BIOPSY Left 02/18/2015  . BREAST BIOPSY Right 02/18/2015  . BREAST EXCISIONAL BIOPSY Left 04/05/2015  . BREAST LUMPECTOMY Left 03/27/2015  . BREAST LUMPECTOMY WITH NEEDLE LOCALIZATION AND AXILLARY SENTINEL LYMPH NODE BX Left 03/27/2015   Procedure: LEFT BREAST LUMPECTOMY WITH BRACKETED NEEDLE LOCALIZATION AND LEFT  AXILLARY SENTINEL LYMPH NODE BX;  Surgeon: [name], MD;  Location: [facility];  Service: General;  Laterality: Left;  . BREAST RECONSTRUCTION WITH PLACEMENT OF TISSUE EXPANDER AND FLEX HD (ACELLULAR HYDRATED DERMIS) Bilateral 04/20/2017   Procedure: BILATARAL BREAST RECONSTRUCTION WITH PLACEMENT OF TISSUE EXPANDER AND FLEX HD (ACELLULAR HYDRATED DERMIS);  Surgeon: Wallace Going, DO;  Location: [facility];  Service: Plastics;  Laterality: Bilateral;  . BREAST SURGERY    . CHOLECYSTECTOMY N/A 07/07/2017   Procedure: LAPAROSCOPIC CHOLECYSTECTOMY WITH INTRAOPERATIVE CHOLANGIOGRAM;  Surgeon: [name], MD;  Location: [facility];  Service: General;  Laterality: N/A;  . MASTECTOMY W/ SENTINEL NODE BIOPSY Bilateral 04/20/2017   Procedure: BILATERAL TOTAL MASTECTOMY WITH LEFT AXILLARY SENTINEL LYMPH NODE BIOPSY;  Surgeon: [name], MD;  Location: [facility];  Service: General;  Laterality: Bilateral;  . PORTACATH PLACEMENT Right 04/25/2015   Procedure: INSERTION PORT-A-CATH;  Surgeon: [name], MD;  Location: [facility];  Service: General;  Laterality: Right;  . PORTACATH PLACEMENT Right 04/20/2017   Procedure: INSERTION PORT-A-CATH;  Surgeon: [name], MD;  Location: [facility];  Service: General;  Laterality: Right;  .  RE-EXCISION OF BREAST LUMPECTOMY Left 04/05/2015   Procedure: RE-EXCISION LEFT  BREAST LUMPECTOMY;  Surgeon: [name], MD;  Location: [facility];  Service: General;  Laterality: Left;    FAMILY HISTORY Family History  Problem Relation Age of Onset  . Lung cancer Maternal Grandmother 15       non smoker  . Lung cancer Maternal Grandfather 46       non smoker worked in [employer]  . Cancer Paternal Grandfather 35       throat cancer ? smoker  . Cancer Cousin 41       throat cancer ? smoker   the patient's parents are living, in their mid-50s as of March 2016. The patient had no siblings. Both the patient's mother's parents died from lung cancer although they did not smoke. [place] and her maternal grandfather was a Ecologist. There is no history of breast or ovarian cancer in the family to her knowledge  GYNECOLOGIC HISTORY:  No LMP recorded. Patient is premenopausal. Menarche age 64, first live birth age 64. The patient is GX P3. [name] having regular periods. She uses the Essure device for contraception.  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 Her husband [name] is a Freight forwarder. Their daughter [name] lives in [place] where she works in Press photographer. Daughter Summer is a Research scientist (physical sciences) and lives with the patient.. Daughter [name] also at home is 40 years old.    ADVANCED DIRECTIVES: Not in place   HEALTH MAINTENANCE: Social History  Substance Use  Topics  . Smoking status: Never Smoker  . Smokeless tobacco: Never Used  . Alcohol use 0.0 oz/week     Comment: occ     Colonoscopy:  PAP:  Bone density:  Lipid panel:  Allergies  Allergen Reactions  . Adhesive [Tape] Other (See Comments)    Skin blisters    Current Outpatient Prescriptions  Medication Sig Dispense Refill  . cyclobenzaprine (FLEXERIL) 10 MG tablet Take 10 mg by mouth 3 (three) times daily as needed for muscle spasms.    Marland Kitchen  dexamethasone (DECADRON) 4 MG tablet Take 1 tablet twice a day with meals beginning the day after chemotherapy and continuing for 3 days (Patient taking differently: Take 4 mg by mouth See admin instructions. Take 1 tablet twice a day with meals beginning the day after chemotherapy and continuing for 3 days) 50 tablet 1  . HYDROcodone-acetaminophen (NORCO/VICODIN) 5-325 MG tablet Take 1 tablet by mouth every 6 (six) hours as needed for moderate pain.    Marland Kitchen ibuprofen (ADVIL,MOTRIN) 200 MG tablet Take 400 mg by mouth every 8 (eight) hours as needed for mild pain.    Marland Kitchen lidocaine-prilocaine (EMLA) cream Apply to affected area once (Patient taking differently: Apply 1 application topically daily as needed (prior to port being accessed). ) 30 g 3  . oxyCODONE-acetaminophen (PERCOCET/ROXICET) 5-325 MG tablet Take 1 tablet by mouth every 4 (four) hours as needed for severe pain. 8 tablet 0  . polyethylene glycol (MIRALAX / GLYCOLAX) packet Take 17 g by mouth daily. Mix one tablespoon with 8oz of your favorite juice or water every day until you are having soft formed stools. Then start taking once daily if you didn't have a stool the day before. 30 each 0  . prochlorperazine (COMPAZINE) 10 MG tablet Take 1 tablet (10 mg total) by mouth every 6 (six) hours as needed (Nausea or vomiting). 30 tablet 1  . promethazine (PHENERGAN) 12.5 MG tablet Take 1 tablet (12.5 mg total) by mouth every 6 (six) hours as needed for nausea or vomiting. 12 tablet 0   No current facility-administered medications for this visit.     OBJECTIVE:  Young white woman in no acute distress  Vitals:   08/19/17 0958  BP: 136/86  Pulse: 91  Resp: 18  Temp: 98.5 F (36.9 C)  SpO2: 100%     Body mass index is 40.38 kg/m.    ECOG FS:1 - Symptomatic but completely ambulatory GENERAL: Patient is a well appearing female in no acute distress HEENT:  Sclerae anicteric.  PERRL.  Oropharynx clear and moist. No ulcerations or evidence of  oropharyngeal candidiasis. Neck is supple.  NODES:  No cervical, supraclavicular, or axillary lymphadenopathy palpated.  BREAST EXAM:  Deferred. LUNGS:  Clear to auscultation bilaterally.  No wheezes or rhonchi. HEART:  Regular rate and rhythm. No murmur appreciated. ABDOMEN:  Soft, nontender.  Positive, normoactive bowel sounds. No organomegaly palpated. MSK:  No focal spinal tenderness to palpation. Full range of motion bilaterally in the upper extremities. EXTREMITIES:  No peripheral edema.   SKIN:  Clear with no obvious rashes or skin changes. No nail dyscrasia. NEURO:  Nonfocal. Well oriented.  Appropriate affect.   LAB RESULTS:  CMP     Component Value Date/Time   NA 141 07/30/2017 1023   K 3.8 07/30/2017 1023   CL 101 07/24/2017 1954   CO2 24 07/30/2017 1023   GLUCOSE 153 (H) 07/30/2017 1023   BUN 9.0 07/30/2017 1023   CREATININE 0.8 07/30/2017 1023  CALCIUM 9.8 07/30/2017 1023   PROT 7.6 07/30/2017 1023   ALBUMIN 3.5 07/30/2017 1023   AST 65 (H) 07/30/2017 1023   ALT 164 (H) 07/30/2017 1023   ALKPHOS 207 (H) 07/30/2017 1023   BILITOT 1.06 07/30/2017 1023   GFRNONAA >60 07/24/2017 1954   GFRAA >60 07/24/2017 1954    INo results found for: SPEP, UPEP  Lab Results  Component Value Date   WBC 4.4 08/19/2017   NEUTROABS 2.1 08/19/2017   HGB 11.5 (L) 08/19/2017   HCT 34.3 (L) 08/19/2017   MCV 85.5 08/19/2017   PLT 319 08/19/2017      Chemistry      Component Value Date/Time   NA 141 07/30/2017 1023   K 3.8 07/30/2017 1023   CL 101 07/24/2017 1954   CO2 24 07/30/2017 1023   BUN 9.0 07/30/2017 1023   CREATININE 0.8 07/30/2017 1023      Component Value Date/Time   CALCIUM 9.8 07/30/2017 1023   ALKPHOS 207 (H) 07/30/2017 1023   AST 65 (H) 07/30/2017 1023   ALT 164 (H) 07/30/2017 1023   BILITOT 1.06 07/30/2017 1023       No results found for: LABCA2  No components found for: LABCA125  No results for input(s): INR in the last 168  hours.  Urinalysis    Component Value Date/Time   COLORURINE YELLOW (A) 07/24/2017 2158   APPEARANCEUR CLEAR (A) 07/24/2017 2158   LABSPEC 1.009 07/24/2017 2158   LABSPEC 1.020 05/30/2015 1502   PHURINE 7.0 07/24/2017 2158   GLUCOSEU NEGATIVE 07/24/2017 2158   GLUCOSEU Negative 05/30/2015 1502   HGBUR SMALL (A) 07/24/2017 2158   BILIRUBINUR NEGATIVE 07/24/2017 2158   BILIRUBINUR Negative 05/30/2015 1502   KETONESUR NEGATIVE 07/24/2017 2158   PROTEINUR NEGATIVE 07/24/2017 2158   UROBILINOGEN 0.2 05/30/2015 1502   NITRITE NEGATIVE 07/24/2017 2158   LEUKOCYTESUR NEGATIVE 07/24/2017 2158   LEUKOCYTESUR Negative 05/30/2015 1502    STUDIES: Ct Abdomen Pelvis W Contrast  Result Date: 07/24/2017 CLINICAL DATA:  41 y/o F; history of cholecystectomy 2 weeks ago presenting with abdominal pain and fever. EXAM: CT ABDOMEN AND PELVIS WITH CONTRAST TECHNIQUE: Multidetector CT imaging of the abdomen and pelvis was performed using the standard protocol following bolus administration of intravenous contrast. CONTRAST:  ISOVUE-300 IOPAMIDOL (ISOVUE-300) INJECTION 61% COMPARISON:  None. FINDINGS: Lower chest: Bilateral breast prostheses. Hepatobiliary: Hepatic steatosis. 2.2 x 2.4 cm (AP by ML series 2, image 21) largely homogeneously enhancing mass with central hypoenhancement on portal venous phase in segment 4A of the liver. Status post cholecystectomy. There is minimal fat stranding within the gallbladder bed compatible with recent surgery, no fluid collection is identified. No intra or extrahepatic biliary ductal dilatation. Pancreas: Unremarkable. No pancreatic ductal dilatation or surrounding inflammatory changes. Spleen: Normal in size without focal abnormality. Adrenals/Urinary Tract: Adrenal glands are unremarkable. Kidneys are normal, without renal calculi, focal lesion, or hydronephrosis. Bladder is unremarkable. Stomach/Bowel: Stomach is within normal limits. Appendix appears normal. No  evidence of bowel wall thickening, distention, or inflammatory changes. Vascular/Lymphatic: Aortic atherosclerosis. No enlarged abdominal or pelvic lymph nodes. Reproductive: Uterus and bilateral adnexa are unremarkable. Essure devices. Other: No abdominal wall hernia or abnormality. No abdominopelvic ascites. Musculoskeletal: No acute or significant osseous findings. IMPRESSION: 1. Indeterminate liver mass the in segment 4A of the liver measuring up to 2.4 cm. Further characterization with prompt liver MRI is recommended. This recommendation follows ACR consensus guidelines: Management of Incidental Liver Lesions on CT: A White Paper of the  ACR Incidental Findings Committee. J Am Coll Radiol 2017; 55:9741-6384. 2. Faint edema in the gallbladder fossa consistent with recent postsurgical change. No abscess identified. 3. Hepatic steatosis. 4. Mild aortic atherosclerosis. Electronically Signed   By: Kristine Garbe M.D.   On: 07/24/2017 23:08    ASSESSMENT: 41 y.o. [place], [place] woman status post left breast upper outer quadrant biopsy 02/08/2015 for ductal carcinoma in situ, grade 2 or 3, strongly estrogen and progesterone receptor positive, with likely areas of microinvasion  (a) biopsy of an area in the left breast upper outer quadrant showed PASH  (b) biopsy of 2 additional questionable areas, one in each breast, showed bilateral fibroadenomas  (1) genetics testing March 2016 through the BreastNext gene panel offered by [employer] showed no deleterious mutations in ATM, BARD1, BRCA1, BRCA2, BRIP1, CDH1, CHEK2, MRE11A, MUTYH, NBN, NF1, PALB2, PTEN, RAD50, RAD51C, RAD51D, and TP53.  (2) status post left lumpectomy with sentinel lymph node sampling 03/27/2015 for a pT1c pN0, stage IA invasive ductal carcinoma, grade 3, triple negative, with an MIB-1 of 33%  (a) close margins were cleared with subsequent excision 04/05/2015.  (3) Oncotype DX score of 38 predicts a risk of 26% outside the breast  recurrence within 10 years if the patient's only systemic treatment is tamoxifen for 5 years  (4) adjuvant chemotherapy started 05/02/2015 consisting of doxorubicin and cyclophosphamide in dose dense fashion 4, completed 06/13/2015, followed by paclitaxel weekly 12.   (a) Paclitaxel stopped after only 2 cycles because of persistent neuropathy.  (5) adjuvant radiation completed 11/08  (5) tamoxifen started 02/20/2015 (neoadjuvantly), discontinued 04/19/2015 so as not to overlap chemotherapy; resumed 02/07/2016, discontinued April 2018 with disease recurrence  RECURRENT DISEASE: April 2018 (6) left breast lower outer quadrant biopsy 03/05/2017 shows invasive ductal carcinoma, grade 2, triple negative  (7) status post bilateral mastectomies and left axillary lymph node dissection 04/20/2017 showing a residual left pT2 pN0 (8 nodes removed) invasive ductal carcinoma, grade 3, with negative margins; the right breast was benign  (8) chemotherapy for her recurrence consisting of cyclophosphamide, methotrexate and fluorouracil (CMF) given every 21 days 8, started 05/13/2017  (9) status post cholecystectomy 07/07/2017, with benign pathology  PLAN  Rodina is doing well today.  Her labs are normal and I reviewed those with her in detail.  She will proceed with chemotherapy today.  [name] tolerating it well.  She will return in 3 weeks for labs, follow up and her next cycle of CMF.    Kaidyn knows to call between now and her next appointment for any questions or concerns.    A total of (20) minutes of face-to-face time was spent with this patient with greater than 50% of that time in counseling and care-coordination.   Scot Dock, NP   08/19/2017 10:04 AM

## 2017-08-30 ENCOUNTER — Other Ambulatory Visit: Payer: Self-pay | Admitting: Oncology

## 2017-08-30 NOTE — Progress Notes (Signed)
The patient is interested in having her surgery moved up for financial reasons. She has had for over 8 planned cycles of CMF. [name] scheduled to see me October 18 with cycle 5. I will alert her surgeon as to her interest in moving this up.

## 2017-08-31 ENCOUNTER — Encounter: Payer: Self-pay | Admitting: Adult Health

## 2017-08-31 ENCOUNTER — Encounter: Payer: Self-pay | Admitting: Oncology

## 2017-09-09 ENCOUNTER — Ambulatory Visit (HOSPITAL_BASED_OUTPATIENT_CLINIC_OR_DEPARTMENT_OTHER): Payer: 59

## 2017-09-09 ENCOUNTER — Ambulatory Visit: Payer: 59

## 2017-09-09 ENCOUNTER — Other Ambulatory Visit: Payer: Self-pay | Admitting: Oncology

## 2017-09-09 ENCOUNTER — Ambulatory Visit: Payer: 59 | Admitting: Oncology

## 2017-09-09 ENCOUNTER — Other Ambulatory Visit (HOSPITAL_BASED_OUTPATIENT_CLINIC_OR_DEPARTMENT_OTHER): Payer: 59

## 2017-09-09 VITALS — BP 133/89 | HR 76 | Temp 98.7°F | Resp 17

## 2017-09-09 DIAGNOSIS — C50912 Malignant neoplasm of unspecified site of left female breast: Secondary | ICD-10-CM

## 2017-09-09 DIAGNOSIS — Z171 Estrogen receptor negative status [ER-]: Principal | ICD-10-CM

## 2017-09-09 DIAGNOSIS — G620 Drug-induced polyneuropathy: Secondary | ICD-10-CM

## 2017-09-09 DIAGNOSIS — C50412 Malignant neoplasm of upper-outer quadrant of left female breast: Secondary | ICD-10-CM

## 2017-09-09 DIAGNOSIS — Z5111 Encounter for antineoplastic chemotherapy: Secondary | ICD-10-CM | POA: Diagnosis not present

## 2017-09-09 DIAGNOSIS — T451X5A Adverse effect of antineoplastic and immunosuppressive drugs, initial encounter: Secondary | ICD-10-CM

## 2017-09-09 DIAGNOSIS — Z95828 Presence of other vascular implants and grafts: Secondary | ICD-10-CM | POA: Insufficient documentation

## 2017-09-09 LAB — CBC WITH DIFFERENTIAL/PLATELET
BASO%: 0.6 % (ref 0.0–2.0)
Basophils Absolute: 0 10*3/uL (ref 0.0–0.1)
EOS%: 3.4 % (ref 0.0–7.0)
Eosinophils Absolute: 0.2 10*3/uL (ref 0.0–0.5)
HCT: 35.9 % (ref 34.8–46.6)
HGB: 11.8 g/dL (ref 11.6–15.9)
LYMPH%: 32.2 % (ref 14.0–49.7)
MCH: 28.8 pg (ref 25.1–34.0)
MCHC: 32.9 g/dL (ref 31.5–36.0)
MCV: 87.6 fL (ref 79.5–101.0)
MONO#: 0.7 10*3/uL (ref 0.1–0.9)
MONO%: 15 % — AB (ref 0.0–14.0)
NEUT%: 48.8 % (ref 38.4–76.8)
NEUTROS ABS: 2.4 10*3/uL (ref 1.5–6.5)
Platelets: 380 10*3/uL (ref 145–400)
RBC: 4.1 10*6/uL (ref 3.70–5.45)
RDW: 15.8 % — ABNORMAL HIGH (ref 11.2–14.5)
WBC: 4.9 10*3/uL (ref 3.9–10.3)
lymph#: 1.6 10*3/uL (ref 0.9–3.3)

## 2017-09-09 LAB — COMPREHENSIVE METABOLIC PANEL
ALT: 27 U/L (ref 0–55)
AST: 26 U/L (ref 5–34)
Albumin: 3.8 g/dL (ref 3.5–5.0)
Alkaline Phosphatase: 108 U/L (ref 40–150)
Anion Gap: 10 mEq/L (ref 3–11)
BUN: 9.3 mg/dL (ref 7.0–26.0)
CHLORIDE: 105 meq/L (ref 98–109)
CO2: 23 meq/L (ref 22–29)
Calcium: 9.6 mg/dL (ref 8.4–10.4)
Creatinine: 0.8 mg/dL (ref 0.6–1.1)
GLUCOSE: 104 mg/dL (ref 70–140)
POTASSIUM: 3.9 meq/L (ref 3.5–5.1)
SODIUM: 138 meq/L (ref 136–145)
TOTAL PROTEIN: 7.2 g/dL (ref 6.4–8.3)
Total Bilirubin: 0.77 mg/dL (ref 0.20–1.20)

## 2017-09-09 MED ORDER — PALONOSETRON HCL INJECTION 0.25 MG/5ML
INTRAVENOUS | Status: AC
  Filled 2017-09-09: qty 5

## 2017-09-09 MED ORDER — SODIUM CHLORIDE 0.9% FLUSH
10.0000 mL | INTRAVENOUS | Status: DC | PRN
  Administered 2017-09-09: 10 mL
  Filled 2017-09-09: qty 10

## 2017-09-09 MED ORDER — PALONOSETRON HCL INJECTION 0.25 MG/5ML
0.2500 mg | Freq: Once | INTRAVENOUS | Status: AC
  Administered 2017-09-09: 0.25 mg via INTRAVENOUS

## 2017-09-09 MED ORDER — SODIUM CHLORIDE 0.9% FLUSH
10.0000 mL | INTRAVENOUS | Status: DC | PRN
  Administered 2017-09-09: 10 mL via INTRAVENOUS
  Filled 2017-09-09: qty 10

## 2017-09-09 MED ORDER — HEPARIN SOD (PORK) LOCK FLUSH 100 UNIT/ML IV SOLN
500.0000 [IU] | Freq: Once | INTRAVENOUS | Status: DC | PRN
  Filled 2017-09-09: qty 5

## 2017-09-09 MED ORDER — DEXAMETHASONE SODIUM PHOSPHATE 10 MG/ML IJ SOLN
INTRAMUSCULAR | Status: AC
  Filled 2017-09-09: qty 1

## 2017-09-09 MED ORDER — SODIUM CHLORIDE 0.9 % IV SOLN
600.0000 mg/m2 | Freq: Once | INTRAVENOUS | Status: AC
  Administered 2017-09-09: 1240 mg via INTRAVENOUS
  Filled 2017-09-09: qty 62

## 2017-09-09 MED ORDER — METHOTREXATE SODIUM (PF) CHEMO INJECTION 250 MG/10ML
40.0000 mg/m2 | Freq: Once | INTRAMUSCULAR | Status: AC
  Administered 2017-09-09: 82 mg via INTRAVENOUS
  Filled 2017-09-09: qty 3.28

## 2017-09-09 MED ORDER — SODIUM CHLORIDE 0.9 % IV SOLN
Freq: Once | INTRAVENOUS | Status: AC
  Administered 2017-09-09: 12:00:00 via INTRAVENOUS

## 2017-09-09 MED ORDER — DEXAMETHASONE SODIUM PHOSPHATE 10 MG/ML IJ SOLN
10.0000 mg | Freq: Once | INTRAMUSCULAR | Status: AC
  Administered 2017-09-09: 10 mg via INTRAVENOUS

## 2017-09-09 MED ORDER — HEPARIN SOD (PORK) LOCK FLUSH 100 UNIT/ML IV SOLN
500.0000 [IU] | Freq: Once | INTRAVENOUS | Status: AC | PRN
  Administered 2017-09-09: 500 [IU]
  Filled 2017-09-09: qty 5

## 2017-09-09 MED ORDER — FLUOROURACIL CHEMO INJECTION 2.5 GM/50ML
600.0000 mg/m2 | Freq: Once | INTRAVENOUS | Status: AC
  Administered 2017-09-09: 1250 mg via INTRAVENOUS
  Filled 2017-09-09: qty 25

## 2017-09-09 NOTE — Progress Notes (Unsigned)
[place]  [phone] [phone]     ID: Cheryl [name] DOB: [birth year]  MR#: [id number]  OAC#:[id number]  [name] Care Team: Practice, [facility] as PCP - General (Family Medicine) [name], MD as Consulting Physician (Obstetrics and Gynecology) [name], MD as Consulting Physician (General Surgery) Magrinat, Virgie Dad, MD as Consulting Physician (Oncology) [name], RN as Registered Nurse [name], RN as Registered Nurse Dillingham, [name], DO as Attending Physician (Plastic Surgery) PCP: [facility], [facility] OTHER MD:  CHIEF COMPLAINT: Recurrent triple negative breast cancer  CURRENT TREATMENT: Adjuvant chemotherapy   INTERVAL HISTORY:   Cheryl [name] is here today for evaluation prior to receiving cycle four of CMF chemotherapy.  This is her second cycle after her cholecystectomy.  [name] tolerating Cheryl treatment well and is without any questions or concerns today.    REVIEW OF SYSTEMS: Cheryl [name] denies any vision changes, nausea, vomiting, mucositis, headaches or any other concerns today.  A detailed ROS is non contributory.     HISTORY OF RECURRENT DISEASE: From my 03/18/2017 note:  Cheryl [name] that he'll returns today for follow-up of her triple negative breast cancer, which is now recurrent. She had routine bilateral diagnostic mammography at [facility] 03/10/2017 and this showed Cheryl breast density to be category C. Cheryl old lumpectomy site, which was in Cheryl upper outer quadrant, appears stable. However there was a new area of asymmetry in Cheryl lower inner quadrant of Cheryl left breast. Ultrasound confirmed this to be a 1.0 cm mass at Cheryl 7:00 radiant 5 cm from Cheryl nipple. Aspiration was attempted but no fluid was aspirated and biopsy of this mass was obtained 03/05/2017. This showed ([id number]) invasive mammary carcinoma, grade 2, estrogen and progesterone receptor negative,  HER-2 not amplified with a signals ratio 1.17 and a number per cell of 2.45, with an MIB-1 of 15%.  [name] here today to discuss subsequent management   BREAST CANCER HISTORY: From Cheryl original intake note:  Cheryl [name] had screening mammography 02/01/2015 which showed a calcifications in Cheryl upper outer quadrant of Cheryl left breast. On 02/08/2015 she underwent left diagnostic mammography with tomosynthesis and left breast [facility]. Cheryl breast density was category C. Mammography confirmed a group of pleomorphic calcifications spanning 4.2 cm maximally. Physical examination of Cheryl upper outer quadrant of Cheryl left breast showed an area of thickening at Cheryl approximate 2:00 position. Ultrasound of this area showed no discrete masses. There was vague shadowing present. Calcifications could not be clearly identified sonographically. There was no lymphadenopathy in Cheryl left axilla.  On Cheryl same day Cheryl [name] underwent biopsy of Cheryl left breast area in question, with Cheryl pathology (S [id number] [phone]) showing ductal carcinoma in situ, grade 2 or 3, with possible areas of microinvasion, Cheryl cancer cells being strongly estrogen and progesterone receptor positive, both at 100%.  On 02/15/2015 Cheryl [name] underwent bilateral breast MRI. This showed Cheryl area of malignancy in Cheryl upper outer quadrant to measure 4.5 cm, including a large area of non-masslike enhancement and a 1 cm spiculated mass. There was also an indeterminate oval mass in Cheryl central left breast and another indeterminant mass in Cheryl slightly outer right breast anteriorly. Biopsy of Cheryl right breast mass 02/18/2015 ([id number]) showed a fibroadenoma, with no evidence of malignancy. Biopsy of 2 additional areas of Cheryl left breast (at 10:00 and 2:00) showed a fibroadenoma in Cheryl upper inner quadrant, and pseudo-angiomatous stromal hyperplasia (  PA SH) at Cheryl 2:00 area of Cheryl left breast.  Cheryl [name]'s subsequent history is  as detailed below.   PAST MEDICAL HISTORY: Past Medical History:  Diagnosis Date  . Breast cancer ([place])    left breast  . Wears contact lenses     PAST SURGICAL HISTORY: Past Surgical History:  Procedure Laterality Date  . BILATERAL TOTAL MASTECTOMY WITH AXILLARY LYMPH NODE DISSECTION  04/20/2017  . BREAST BIOPSY Left 02/18/2015  . BREAST BIOPSY Right 02/18/2015  . BREAST EXCISIONAL BIOPSY Left 04/05/2015  . BREAST LUMPECTOMY Left 03/27/2015  . BREAST LUMPECTOMY WITH NEEDLE LOCALIZATION AND AXILLARY SENTINEL LYMPH NODE BX Left 03/27/2015   Procedure: LEFT BREAST LUMPECTOMY WITH BRACKETED NEEDLE LOCALIZATION AND LEFT  AXILLARY SENTINEL LYMPH NODE BX;  Surgeon: [name], MD;  Location: [place];  Service: General;  Laterality: Left;  . BREAST RECONSTRUCTION WITH PLACEMENT OF TISSUE EXPANDER AND FLEX HD (ACELLULAR HYDRATED DERMIS) Bilateral 04/20/2017   Procedure: BILATARAL BREAST RECONSTRUCTION WITH PLACEMENT OF TISSUE EXPANDER AND FLEX HD (ACELLULAR HYDRATED DERMIS);  Surgeon: Wallace Going, DO;  Location: [place];  Service: Plastics;  Laterality: Bilateral;  . BREAST SURGERY    . CHOLECYSTECTOMY N/A 07/07/2017   Procedure: LAPAROSCOPIC CHOLECYSTECTOMY WITH INTRAOPERATIVE CHOLANGIOGRAM;  Surgeon: [name], MD;  Location: [facility];  Service: General;  Laterality: N/A;  . MASTECTOMY W/ SENTINEL NODE BIOPSY Bilateral 04/20/2017   Procedure: BILATERAL TOTAL MASTECTOMY WITH LEFT AXILLARY SENTINEL LYMPH NODE BIOPSY;  Surgeon: [name], MD;  Location: [place];  Service: General;  Laterality: Bilateral;  . PORTACATH PLACEMENT Right 04/25/2015   Procedure: INSERTION PORT-A-CATH;  Surgeon: [name], MD;  Location: [place];  Service: General;  Laterality: Right;  . PORTACATH PLACEMENT Right 04/20/2017   Procedure: INSERTION PORT-A-CATH;  Surgeon: [name], MD;  Location: [place];  Service: General;  Laterality: Right;  .  RE-EXCISION OF BREAST LUMPECTOMY Left 04/05/2015   Procedure: RE-EXCISION LEFT  BREAST LUMPECTOMY;  Surgeon: [name], MD;  Location: [place];  Service: General;  Laterality: Left;    FAMILY HISTORY Family History  Problem Relation Age of Onset  . Lung cancer Maternal Grandmother 15       non smoker  . Lung cancer Maternal Grandfather 46       non smoker worked in [employer]  . Cancer Paternal Grandfather 35       throat cancer ? smoker  . Cancer Cousin 41       throat cancer ? smoker   Cheryl [name]'s parents are living, in their mid-50s as of March 2016. Cheryl [name] had no siblings. Both Cheryl [name]'s mother's parents died from lung cancer although they did not smoke. [place] and her maternal grandfather was a Ecologist. There is no history of breast or ovarian cancer in Cheryl family to her knowledge  GYNECOLOGIC HISTORY:  No LMP recorded. [name] is premenopausal. Menarche age 64, first live birth age 64. Cheryl [name] is GX P3. [name] having regular periods. She uses Cheryl Essure device for contraception.  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 Her husband [name] is a Freight forwarder. Their daughter [name] lives in [place] where she works in Press photographer. Daughter Summer is a Research scientist (physical sciences) and lives with Cheryl [name].. Daughter [name] also at home is 40 years old.    ADVANCED DIRECTIVES: Not in place   HEALTH MAINTENANCE: Social History  Substance Use  Topics  . Smoking status: Never Smoker  . Smokeless tobacco: Never Used  . Alcohol use 0.0 oz/week     Comment: occ     Colonoscopy:  PAP:  Bone density:  Lipid panel:  Allergies  Allergen Reactions  . Adhesive [Tape] Other (See Comments)    Skin blisters    Current Outpatient Prescriptions  Medication Sig Dispense Refill  . lidocaine-prilocaine (EMLA) cream Apply to affected area once ([name] taking differently: Apply 1 application topically daily  as needed (prior to port being accessed). ) 30 g 3  . prochlorperazine (COMPAZINE) 10 MG tablet Take 1 tablet (10 mg total) by mouth every 6 (six) hours as needed (Nausea or vomiting). 30 tablet 1  . promethazine (PHENERGAN) 12.5 MG tablet Take 1 tablet (12.5 mg total) by mouth every 6 (six) hours as needed for nausea or vomiting. 12 tablet 0   No current facility-administered medications for this visit.     OBJECTIVE:  Young white woman in no acute distress  There were no vitals filed for this visit.   There is no height or weight on file to calculate BMI.    ECOG FS:1 - Symptomatic but completely ambulatory GENERAL: [name] is a well appearing female in no acute distress HEENT:  Sclerae anicteric.  PERRL.  Oropharynx clear and moist. No ulcerations or evidence of oropharyngeal candidiasis. Neck is supple.  NODES:  No cervical, supraclavicular, or axillary lymphadenopathy palpated.  BREAST EXAM:  Deferred. LUNGS:  Clear to auscultation bilaterally.  No wheezes or rhonchi. HEART:  Regular rate and rhythm. No murmur appreciated. ABDOMEN:  Soft, nontender.  Positive, normoactive bowel sounds. No organomegaly palpated. MSK:  No focal spinal tenderness to palpation. Full range of motion bilaterally in Cheryl upper extremities. EXTREMITIES:  No peripheral edema.   SKIN:  Clear with no obvious rashes or skin changes. No nail dyscrasia. NEURO:  Nonfocal. Well oriented.  Appropriate affect.   LAB RESULTS:  CMP     Component Value Date/Time   NA 142 08/19/2017 0927   K 3.5 08/19/2017 0927   CL 101 07/24/2017 1954   CO2 25 08/19/2017 0927   GLUCOSE 147 (H) 08/19/2017 0927   BUN 9.0 08/19/2017 0927   CREATININE 0.8 08/19/2017 0927   CALCIUM 9.4 08/19/2017 0927   PROT 6.8 08/19/2017 0927   ALBUMIN 3.4 (L) 08/19/2017 0927   AST 27 08/19/2017 0927   ALT 30 08/19/2017 0927   ALKPHOS 105 08/19/2017 0927   BILITOT 0.76 08/19/2017 0927   GFRNONAA >60 07/24/2017 1954   GFRAA >60 07/24/2017 1954      INo results found for: SPEP, UPEP  Lab Results  Component Value Date   WBC 4.9 09/09/2017   NEUTROABS 2.4 09/09/2017   HGB 11.8 09/09/2017   HCT 35.9 09/09/2017   MCV 87.6 09/09/2017   PLT 380 09/09/2017      Chemistry      Component Value Date/Time   NA 142 08/19/2017 0927   K 3.5 08/19/2017 0927   CL 101 07/24/2017 1954   CO2 25 08/19/2017 0927   BUN 9.0 08/19/2017 0927   CREATININE 0.8 08/19/2017 0927      Component Value Date/Time   CALCIUM 9.4 08/19/2017 0927   ALKPHOS 105 08/19/2017 0927   AST 27 08/19/2017 0927   ALT 30 08/19/2017 0927   BILITOT 0.76 08/19/2017 0927       No results found for: LABCA2  No components found for: YYQMG500  No results for input(s): INR in  Cheryl last 168 hours.  Urinalysis    Component Value Date/Time   COLORURINE YELLOW (A) 07/24/2017 2158   APPEARANCEUR CLEAR (A) 07/24/2017 2158   LABSPEC 1.009 07/24/2017 2158   LABSPEC 1.020 05/30/2015 1502   PHURINE 7.0 07/24/2017 2158   GLUCOSEU NEGATIVE 07/24/2017 2158   GLUCOSEU Negative 05/30/2015 1502   HGBUR SMALL (A) 07/24/2017 2158   BILIRUBINUR NEGATIVE 07/24/2017 2158   BILIRUBINUR Negative 05/30/2015 [place] 07/24/2017 2158   PROTEINUR NEGATIVE 07/24/2017 2158   UROBILINOGEN 0.2 05/30/2015 1502   NITRITE NEGATIVE 07/24/2017 2158   LEUKOCYTESUR NEGATIVE 07/24/2017 2158   LEUKOCYTESUR Negative 05/30/2015 1502    STUDIES: No results found.  ASSESSMENT: 41 y.o. [place],  woman status post left breast upper outer quadrant biopsy 02/08/2015 for ductal carcinoma in situ, grade 2 or 3, strongly estrogen and progesterone receptor positive, with likely areas of microinvasion  (a) biopsy of an area in Cheryl left breast upper outer quadrant showed PASH  (b) biopsy of 2 additional questionable areas, one in each breast, showed bilateral fibroadenomas  (1) genetics testing March 2016 through [facility] gene panel offered by [employer] showed no  deleterious mutations in ATM, BARD1, BRCA1, BRCA2, BRIP1, CDH1, CHEK2, MRE11A, MUTYH, NBN, NF1, PALB2, PTEN, RAD50, RAD51C, RAD51D, and TP53.  (2) status post left lumpectomy with sentinel lymph node sampling 03/27/2015 for a pT1c pN0, stage IA invasive ductal carcinoma, grade 3, triple negative, with an MIB-1 of 33%  (a) close margins were cleared with subsequent excision 04/05/2015.  (3) Oncotype DX score of 38 predicts a risk of 26% outside Cheryl breast recurrence within 10 years if Cheryl [name]'s only systemic treatment is tamoxifen for 5 years  (4) adjuvant chemotherapy started 05/02/2015 consisting of doxorubicin and cyclophosphamide in dose dense fashion 4, completed 06/13/2015, followed by paclitaxel weekly 12.   (a) Paclitaxel stopped after only 2 cycles because of persistent neuropathy.  (5) adjuvant radiation completed 11/08  (5) tamoxifen started 02/20/2015 (neoadjuvantly), discontinued 04/19/2015 so as not to overlap chemotherapy; resumed 02/07/2016, discontinued April 2018 with disease recurrence  RECURRENT DISEASE: April 2018 (6) left breast lower outer quadrant biopsy 03/05/2017 shows invasive ductal carcinoma, grade 2, triple negative  (7) status post bilateral mastectomies and left axillary lymph node dissection 04/20/2017 showing a residual left pT2 pN0 (8 nodes removed) invasive ductal carcinoma, grade 3, with negative margins; Cheryl right breast was benign  (8) chemotherapy for her recurrence consisting of cyclophosphamide, methotrexate and fluorouracil (CMF) given every 21 days 8, started 05/13/2017  (9) status post cholecystectomy 07/07/2017, with benign pathology  PLAN  Tevis is doing well today.  Her labs are normal and I reviewed those with her in detail.  She will proceed with chemotherapy today.  [name] tolerating it well.  She will return in 3 weeks for labs, follow up and her next cycle of CMF.    Cheryl [name] knows to call between now and her next appointment for  any questions or concerns.    A total of (20) minutes of face-to-face time was spent with this [name] with greater than 50% of that time in counseling and care-coordination.   [name], MD   09/09/2017 12:29 PM

## 2017-09-09 NOTE — Patient Instructions (Signed)
[facility] Discharge Instructions for Patients Receiving Chemotherapy  Today you received the following chemotherapy agents Cytoxan, Methotrexate, Adrucil  To help prevent nausea and vomiting after your treatment, we encourage you to take your nausea medication   If you develop nausea and vomiting that is not controlled by your nausea medication, call the clinic.   BELOW ARE SYMPTOMS THAT SHOULD BE REPORTED IMMEDIATELY:  *FEVER GREATER THAN 100.5 F  *CHILLS WITH OR WITHOUT FEVER  NAUSEA AND VOMITING THAT IS NOT CONTROLLED WITH YOUR NAUSEA MEDICATION  *UNUSUAL SHORTNESS OF BREATH  *UNUSUAL BRUISING OR BLEEDING  TENDERNESS IN MOUTH AND THROAT WITH OR WITHOUT PRESENCE OF ULCERS  *URINARY PROBLEMS  *BOWEL PROBLEMS  UNUSUAL RASH Items with * indicate a potential emergency and should be followed up as soon as possible.  Feel free to call the clinic you have any questions or concerns. The clinic phone number is [phone].  Please show the [place] at check-in to the Emergency Department and triage nurse.

## 2017-09-15 ENCOUNTER — Telehealth: Payer: Self-pay | Admitting: *Deleted

## 2017-09-15 NOTE — Telephone Encounter (Signed)
This RN spoke with pt per her concern that she has not been scheduled for her last 2 chemo's in November.  This RN entered LOS with dates for chemo for November.

## 2017-09-17 ENCOUNTER — Encounter: Payer: Self-pay | Admitting: Adult Health

## 2017-09-20 ENCOUNTER — Encounter: Payer: Self-pay | Admitting: Oncology

## 2017-09-20 ENCOUNTER — Telehealth: Payer: Self-pay | Admitting: Oncology

## 2017-09-20 NOTE — Telephone Encounter (Signed)
Scheduled appt per 10/24 sch message - patient is aware of appt date and time - unable to schedule 11/8 due to capped day - per Jenny Reichmann to schedule on 11/9

## 2017-10-01 ENCOUNTER — Ambulatory Visit: Payer: 59

## 2017-10-01 ENCOUNTER — Ambulatory Visit (HOSPITAL_BASED_OUTPATIENT_CLINIC_OR_DEPARTMENT_OTHER): Payer: 59

## 2017-10-01 ENCOUNTER — Other Ambulatory Visit (HOSPITAL_BASED_OUTPATIENT_CLINIC_OR_DEPARTMENT_OTHER): Payer: 59

## 2017-10-01 VITALS — BP 118/81 | HR 75 | Temp 98.8°F | Resp 18

## 2017-10-01 DIAGNOSIS — T451X5A Adverse effect of antineoplastic and immunosuppressive drugs, initial encounter: Secondary | ICD-10-CM

## 2017-10-01 DIAGNOSIS — C50412 Malignant neoplasm of upper-outer quadrant of left female breast: Secondary | ICD-10-CM | POA: Diagnosis not present

## 2017-10-01 DIAGNOSIS — C50912 Malignant neoplasm of unspecified site of left female breast: Secondary | ICD-10-CM

## 2017-10-01 DIAGNOSIS — Z95828 Presence of other vascular implants and grafts: Secondary | ICD-10-CM

## 2017-10-01 DIAGNOSIS — Z5111 Encounter for antineoplastic chemotherapy: Secondary | ICD-10-CM

## 2017-10-01 DIAGNOSIS — Z171 Estrogen receptor negative status [ER-]: Secondary | ICD-10-CM

## 2017-10-01 DIAGNOSIS — G620 Drug-induced polyneuropathy: Secondary | ICD-10-CM

## 2017-10-01 LAB — CBC WITH DIFFERENTIAL/PLATELET
BASO%: 0.9 % (ref 0.0–2.0)
Basophils Absolute: 0.1 10*3/uL (ref 0.0–0.1)
EOS ABS: 0.2 10*3/uL (ref 0.0–0.5)
EOS%: 3.2 % (ref 0.0–7.0)
HCT: 37.2 % (ref 34.8–46.6)
HEMOGLOBIN: 12.2 g/dL (ref 11.6–15.9)
LYMPH%: 29.2 % (ref 14.0–49.7)
MCH: 29.3 pg (ref 25.1–34.0)
MCHC: 32.8 g/dL (ref 31.5–36.0)
MCV: 89.2 fL (ref 79.5–101.0)
MONO#: 0.7 10*3/uL (ref 0.1–0.9)
MONO%: 12.7 % (ref 0.0–14.0)
NEUT%: 54 % (ref 38.4–76.8)
NEUTROS ABS: 2.9 10*3/uL (ref 1.5–6.5)
PLATELETS: 382 10*3/uL (ref 145–400)
RBC: 4.17 10*6/uL (ref 3.70–5.45)
RDW: 15.8 % — AB (ref 11.2–14.5)
WBC: 5.3 10*3/uL (ref 3.9–10.3)
lymph#: 1.6 10*3/uL (ref 0.9–3.3)

## 2017-10-01 LAB — COMPREHENSIVE METABOLIC PANEL
ALBUMIN: 3.9 g/dL (ref 3.5–5.0)
ALT: 40 U/L (ref 0–55)
AST: 40 U/L — ABNORMAL HIGH (ref 5–34)
Alkaline Phosphatase: 91 U/L (ref 40–150)
Anion Gap: 12 mEq/L — ABNORMAL HIGH (ref 3–11)
BUN: 17.6 mg/dL (ref 7.0–26.0)
CO2: 21 mEq/L — ABNORMAL LOW (ref 22–29)
Calcium: 9.5 mg/dL (ref 8.4–10.4)
Chloride: 106 mEq/L (ref 98–109)
Creatinine: 0.8 mg/dL (ref 0.6–1.1)
Glucose: 95 mg/dl (ref 70–140)
Potassium: 3.9 mEq/L (ref 3.5–5.1)
SODIUM: 139 meq/L (ref 136–145)
TOTAL PROTEIN: 7.5 g/dL (ref 6.4–8.3)
Total Bilirubin: 0.77 mg/dL (ref 0.20–1.20)

## 2017-10-01 MED ORDER — SODIUM CHLORIDE 0.9% FLUSH
10.0000 mL | INTRAVENOUS | Status: DC | PRN
  Administered 2017-10-01: 10 mL
  Filled 2017-10-01: qty 10

## 2017-10-01 MED ORDER — PALONOSETRON HCL INJECTION 0.25 MG/5ML
INTRAVENOUS | Status: AC
  Filled 2017-10-01: qty 5

## 2017-10-01 MED ORDER — METHOTREXATE SODIUM (PF) CHEMO INJECTION 250 MG/10ML
40.0000 mg/m2 | Freq: Once | INTRAMUSCULAR | Status: AC
  Administered 2017-10-01: 82 mg via INTRAVENOUS
  Filled 2017-10-01: qty 3.28

## 2017-10-01 MED ORDER — PALONOSETRON HCL INJECTION 0.25 MG/5ML
0.2500 mg | Freq: Once | INTRAVENOUS | Status: AC
  Administered 2017-10-01: 0.25 mg via INTRAVENOUS

## 2017-10-01 MED ORDER — SODIUM CHLORIDE 0.9 % IV SOLN
600.0000 mg/m2 | Freq: Once | INTRAVENOUS | Status: AC
  Administered 2017-10-01: 1240 mg via INTRAVENOUS
  Filled 2017-10-01: qty 62

## 2017-10-01 MED ORDER — SODIUM CHLORIDE 0.9% FLUSH
10.0000 mL | INTRAVENOUS | Status: DC | PRN
  Administered 2017-10-01: 10 mL via INTRAVENOUS
  Filled 2017-10-01: qty 10

## 2017-10-01 MED ORDER — DEXAMETHASONE SODIUM PHOSPHATE 10 MG/ML IJ SOLN
10.0000 mg | Freq: Once | INTRAMUSCULAR | Status: AC
  Administered 2017-10-01: 10 mg via INTRAVENOUS

## 2017-10-01 MED ORDER — FLUOROURACIL CHEMO INJECTION 2.5 GM/50ML
600.0000 mg/m2 | Freq: Once | INTRAVENOUS | Status: AC
  Administered 2017-10-01: 1250 mg via INTRAVENOUS
  Filled 2017-10-01: qty 25

## 2017-10-01 MED ORDER — DEXAMETHASONE SODIUM PHOSPHATE 10 MG/ML IJ SOLN
INTRAMUSCULAR | Status: AC
  Filled 2017-10-01: qty 1

## 2017-10-01 MED ORDER — SODIUM CHLORIDE 0.9 % IV SOLN
Freq: Once | INTRAVENOUS | Status: AC
  Administered 2017-10-01: 14:00:00 via INTRAVENOUS

## 2017-10-01 MED ORDER — HEPARIN SOD (PORK) LOCK FLUSH 100 UNIT/ML IV SOLN
500.0000 [IU] | Freq: Once | INTRAVENOUS | Status: AC | PRN
  Administered 2017-10-01: 500 [IU]
  Filled 2017-10-01: qty 5

## 2017-10-01 NOTE — Patient Instructions (Signed)
[facility] Discharge Instructions for Patients Receiving Chemotherapy  Today you received the following chemotherapy agents Cytoxan, Methotrexate, Adrucil  To help prevent nausea and vomiting after your treatment, we encourage you to take your nausea medication   If you develop nausea and vomiting that is not controlled by your nausea medication, call the clinic.   BELOW ARE SYMPTOMS THAT SHOULD BE REPORTED IMMEDIATELY:  *FEVER GREATER THAN 100.5 F  *CHILLS WITH OR WITHOUT FEVER  NAUSEA AND VOMITING THAT IS NOT CONTROLLED WITH YOUR NAUSEA MEDICATION  *UNUSUAL SHORTNESS OF BREATH  *UNUSUAL BRUISING OR BLEEDING  TENDERNESS IN MOUTH AND THROAT WITH OR WITHOUT PRESENCE OF ULCERS  *URINARY PROBLEMS  *BOWEL PROBLEMS  UNUSUAL RASH Items with * indicate a potential emergency and should be followed up as soon as possible.  Feel free to call the clinic you have any questions or concerns. The clinic phone number is [phone].  Please show the [place] at check-in to the Emergency Department and triage nurse.

## 2017-10-21 ENCOUNTER — Ambulatory Visit (HOSPITAL_BASED_OUTPATIENT_CLINIC_OR_DEPARTMENT_OTHER): Payer: 59

## 2017-10-21 ENCOUNTER — Other Ambulatory Visit (HOSPITAL_BASED_OUTPATIENT_CLINIC_OR_DEPARTMENT_OTHER): Payer: 59

## 2017-10-21 VITALS — BP 138/99 | HR 107 | Temp 98.4°F | Resp 18

## 2017-10-21 DIAGNOSIS — C50412 Malignant neoplasm of upper-outer quadrant of left female breast: Secondary | ICD-10-CM

## 2017-10-21 DIAGNOSIS — C50912 Malignant neoplasm of unspecified site of left female breast: Secondary | ICD-10-CM

## 2017-10-21 DIAGNOSIS — G620 Drug-induced polyneuropathy: Secondary | ICD-10-CM

## 2017-10-21 DIAGNOSIS — Z171 Estrogen receptor negative status [ER-]: Principal | ICD-10-CM

## 2017-10-21 DIAGNOSIS — Z5111 Encounter for antineoplastic chemotherapy: Secondary | ICD-10-CM

## 2017-10-21 DIAGNOSIS — T451X5A Adverse effect of antineoplastic and immunosuppressive drugs, initial encounter: Secondary | ICD-10-CM

## 2017-10-21 LAB — COMPREHENSIVE METABOLIC PANEL
ALBUMIN: 3.9 g/dL (ref 3.5–5.0)
ALT: 37 U/L (ref 0–55)
AST: 32 U/L (ref 5–34)
Alkaline Phosphatase: 105 U/L (ref 40–150)
Anion Gap: 11 mEq/L (ref 3–11)
BUN: 6.5 mg/dL — AB (ref 7.0–26.0)
CHLORIDE: 105 meq/L (ref 98–109)
CO2: 24 meq/L (ref 22–29)
Calcium: 9.8 mg/dL (ref 8.4–10.4)
Creatinine: 0.9 mg/dL (ref 0.6–1.1)
EGFR: >60 mL/min/{1.73_m2} (ref >60)
GLUCOSE: 98 mg/dL (ref 70–140)
POTASSIUM: 4 meq/L (ref 3.5–5.1)
SODIUM: 140 meq/L (ref 136–145)
Total Bilirubin: 0.64 mg/dL (ref 0.20–1.20)
Total Protein: 7.5 g/dL (ref 6.4–8.3)

## 2017-10-21 LAB — CBC WITH DIFFERENTIAL/PLATELET
BASO%: 1.1 % (ref 0.0–2.0)
Basophils Absolute: 0.1 10*3/uL (ref 0.0–0.1)
EOS%: 3.3 % (ref 0.0–7.0)
Eosinophils Absolute: 0.2 10*3/uL (ref 0.0–0.5)
HCT: 37.5 % (ref 34.8–46.6)
HEMOGLOBIN: 12.4 g/dL (ref 11.6–15.9)
LYMPH#: 1.9 10*3/uL (ref 0.9–3.3)
LYMPH%: 35.3 % (ref 14.0–49.7)
MCH: 29.6 pg (ref 25.1–34.0)
MCHC: 33.1 g/dL (ref 31.5–36.0)
MCV: 89.5 fL (ref 79.5–101.0)
MONO#: 0.7 10*3/uL (ref 0.1–0.9)
MONO%: 12.8 % (ref 0.0–14.0)
NEUT#: 2.6 10*3/uL (ref 1.5–6.5)
NEUT%: 47.5 % (ref 38.4–76.8)
Platelets: 355 10*3/uL (ref 145–400)
RBC: 4.19 10*6/uL (ref 3.70–5.45)
RDW: 15.2 % — ABNORMAL HIGH (ref 11.2–14.5)
WBC: 5.4 10*3/uL (ref 3.9–10.3)
nRBC: 0 % (ref 0–0)

## 2017-10-21 MED ORDER — METHOTREXATE SODIUM (PF) CHEMO INJECTION 250 MG/10ML
40.0000 mg/m2 | Freq: Once | INTRAMUSCULAR | Status: AC
  Administered 2017-10-21: 82 mg via INTRAVENOUS
  Filled 2017-10-21: qty 3.28

## 2017-10-21 MED ORDER — PALONOSETRON HCL INJECTION 0.25 MG/5ML
INTRAVENOUS | Status: AC
  Filled 2017-10-21: qty 5

## 2017-10-21 MED ORDER — HEPARIN SOD (PORK) LOCK FLUSH 100 UNIT/ML IV SOLN
500.0000 [IU] | Freq: Once | INTRAVENOUS | Status: AC | PRN
  Administered 2017-10-21: 500 [IU]
  Filled 2017-10-21: qty 5

## 2017-10-21 MED ORDER — SODIUM CHLORIDE 0.9 % IV SOLN
600.0000 mg/m2 | Freq: Once | INTRAVENOUS | Status: AC
  Administered 2017-10-21: 1240 mg via INTRAVENOUS
  Filled 2017-10-21: qty 62

## 2017-10-21 MED ORDER — DEXAMETHASONE SODIUM PHOSPHATE 10 MG/ML IJ SOLN
10.0000 mg | Freq: Once | INTRAMUSCULAR | Status: AC
  Administered 2017-10-21: 10 mg via INTRAVENOUS

## 2017-10-21 MED ORDER — SODIUM CHLORIDE 0.9% FLUSH
10.0000 mL | INTRAVENOUS | Status: DC | PRN
  Administered 2017-10-21: 10 mL
  Filled 2017-10-21: qty 10

## 2017-10-21 MED ORDER — SODIUM CHLORIDE 0.9 % IV SOLN
Freq: Once | INTRAVENOUS | Status: AC
  Administered 2017-10-21: 13:00:00 via INTRAVENOUS

## 2017-10-21 MED ORDER — FLUOROURACIL CHEMO INJECTION 2.5 GM/50ML
600.0000 mg/m2 | Freq: Once | INTRAVENOUS | Status: AC
  Administered 2017-10-21: 1250 mg via INTRAVENOUS
  Filled 2017-10-21: qty 25

## 2017-10-21 MED ORDER — PALONOSETRON HCL INJECTION 0.25 MG/5ML
0.2500 mg | Freq: Once | INTRAVENOUS | Status: AC
  Administered 2017-10-21: 0.25 mg via INTRAVENOUS

## 2017-10-21 MED ORDER — DEXAMETHASONE SODIUM PHOSPHATE 10 MG/ML IJ SOLN
INTRAMUSCULAR | Status: AC
  Filled 2017-10-21: qty 1

## 2017-10-21 NOTE — Patient Instructions (Signed)
[facility] Discharge Instructions for Patients Receiving Chemotherapy  Today you received the following chemotherapy agents Cytoxan, Methotrexate, Adrucil  To help prevent nausea and vomiting after your treatment, we encourage you to take your nausea medication   If you develop nausea and vomiting that is not controlled by your nausea medication, call the clinic.   BELOW ARE SYMPTOMS THAT SHOULD BE REPORTED IMMEDIATELY:  *FEVER GREATER THAN 100.5 F  *CHILLS WITH OR WITHOUT FEVER  NAUSEA AND VOMITING THAT IS NOT CONTROLLED WITH YOUR NAUSEA MEDICATION  *UNUSUAL SHORTNESS OF BREATH  *UNUSUAL BRUISING OR BLEEDING  TENDERNESS IN MOUTH AND THROAT WITH OR WITHOUT PRESENCE OF ULCERS  *URINARY PROBLEMS  *BOWEL PROBLEMS  UNUSUAL RASH Items with * indicate a potential emergency and should be followed up as soon as possible.  Feel free to call the clinic you have any questions or concerns. The clinic phone number is [phone].  Please show the [place] at check-in to the Emergency Department and triage nurse.

## 2017-10-28 ENCOUNTER — Encounter (HOSPITAL_BASED_OUTPATIENT_CLINIC_OR_DEPARTMENT_OTHER): Payer: Self-pay | Admitting: *Deleted

## 2017-11-02 ENCOUNTER — Ambulatory Visit: Payer: Self-pay | Admitting: Plastic Surgery

## 2017-11-02 DIAGNOSIS — Z9013 Acquired absence of bilateral breasts and nipples: Secondary | ICD-10-CM

## 2017-11-02 NOTE — H&P (View-Only) (Signed)
Cheryl [name] is an 41 y.o. female.   Chief Complaint: acquired absence of breast HPI: Cheryl [name] is a 41 yo female here for exchange surgery. [name] status  immediate bilateral breast reconstruction with TE/ADM on 04/20/17.  M[place]or A[place]ora 300 cc expanders currently 590/300 cc [name] now ready for exchange surgery with removal of bilateral TE and placement of bilateral silicone implants.   [name] S/P radiation on the left in 2016. We discussed that if all goes well at exchange, she might benefit from lipo-filling several months following exchange surgery to improve the result.   History:  She had two lumpectomies on the LEFT breast in 2016 followed by radiation.  She now has a new triple negative grade 2 invasive ductal carcinoma of the lower inner left breast. She underwent screening mammography 01/2015 which showed calcifications in the upper outer quadrant of the LEFT brast.  A diagnostic mammography with tomo followed by U/S was done with a biopsy showing ductal carcinoma in situ, grade 2 or 3 with possible microinvasion and ER/PR positive tumor. Bilateral breast MRI confirmed an upper outer quadrant lesion 4.5 cm with mass like enhancement and spiculation.  A RIGHT breast outer anterio lesion was found and biopsy showed a fibroadenoma.  The patient is 5 feet 2 inches tall, weight is 212 pounds. Preop bra= 38DD.  Past Medical History:  Diagnosis Date  . Breast cancer ([place])    left breast  . Wears contact lenses     Past Surgical History:  Procedure Laterality Date  . BILATERAL TOTAL MASTECTOMY WITH AXILLARY LYMPH NODE DISSECTION  04/20/2017  . BREAST BIOPSY Left 02/18/2015  . BREAST BIOPSY Right 02/18/2015  . BREAST EXCISIONAL BIOPSY Left 04/05/2015  . BREAST LUMPECTOMY Left 03/27/2015  . BREAST LUMPECTOMY WITH NEEDLE LOCALIZATION AND AXILLARY SENTINEL LYMPH NODE BX Left 03/27/2015   Procedure: LEFT BREAST LUMPECTOMY WITH BRACKETED NEEDLE LOCALIZATION AND LEFT  AXILLARY SENTINEL  LYMPH NODE BX;  Surgeon: [name], MD;  Location: [facility];  Service: General;  Laterality: Left;  . BREAST RECONSTRUCTION WITH PLACEMENT OF TISSUE EXPANDER AND FLEX HD (ACELLULAR HYDRATED DERMIS) Bilateral 04/20/2017   Procedure: BILATARAL BREAST RECONSTRUCTION WITH PLACEMENT OF TISSUE EXPANDER AND FLEX HD (ACELLULAR HYDRATED DERMIS);  Surgeon: Wallace Going, DO;  Location: [facility];  Service: Plastics;  Laterality: Bilateral;  . BREAST SURGERY    . CHOLECYSTECTOMY N/A 07/07/2017   Procedure: LAPAROSCOPIC CHOLECYSTECTOMY WITH INTRAOPERATIVE CHOLANGIOGRAM;  Surgeon: [name], MD;  [facility];  Service: General;  Laterality: N/A;  . MASTECTOMY W/ SENTINEL NODE BIOPSY Bilateral 04/20/2017   Procedure: BILATERAL TOTAL MASTECTOMY WITH LEFT AXILLARY SENTINEL LYMPH NODE BIOPSY;  Surgeon: [name], MD;  Location: [facility];  Service: General;  Laterality: Bilateral;  . PORTACATH PLACEMENT Right 04/25/2015   Procedure: INSERTION PORT-A-CATH;  Surgeon: [name], MD;  Location: [facility];  Service: General;  Laterality: Right;  . PORTACATH PLACEMENT Right 04/20/2017   Procedure: INSERTION PORT-A-CATH;  Surgeon: [name], MD;  Location: [facility];  Service: General;  Laterality: Right;  . RE-EXCISION OF BREAST LUMPECTOMY Left 04/05/2015   Procedure: RE-EXCISION LEFT  BREAST LUMPECTOMY;  Surgeon: [name], MD;  Location: [place];  Service: General;  Laterality: Left;    Family History  Problem Relation Age of Onset  . Lung cancer Maternal Grandmother 40       non smoker  . Lung cancer Maternal Grandfather 65       non  smoker worked in [employer]  . Cancer Paternal Grandfather 35       throat cancer ? smoker  . Cancer Cousin 41       throat cancer ? smoker   Social History:  reports that  has never smoked. she has never used smokeless tobacco. She reports that she drinks alcohol. She reports that she  does not use drugs.  Allergies:  Allergies  Allergen Reactions  . Adhesive [Tape] Other (See Comments)    Skin blisters     (Not in a hospital admission)  No results found for this or any previous visit (from the past 48 hour(s)). No results found.  Review of Systems  Constitutional: Negative.   HENT: Negative.   Eyes: Negative.   Respiratory: Negative.   Cardiovascular: Negative.   Genitourinary: Negative.   Musculoskeletal: Negative.   Skin: Negative.   Neurological: Negative.   Psychiatric/Behavioral: Negative.     Last menstrual period 04/02/2015. Physical Exam  Constitutional: [name] oriented to person, place, and time. She appears well-developed and well-nourished.  HENT:  Head: Normocephalic and atraumatic.  Eyes: Conjunctivae and EOM are normal. Pupils are equal, round, and reactive to light.  Cardiovascular: Normal rate.  Respiratory: Effort normal.  GI: Soft.  Neurological: [name] alert and oriented to person, place, and time.  Skin: Skin is warm.  Psychiatric: She has a normal mood and affect. Her behavior is normal. Judgment and thought content normal.     Assessment/Plan  Currently the expanders have 590/300 cc Plan exchange surgery with removal of bilateral tissue expanders and placement of bilateral silicone implants.  The risks that can be encountered with and after placement of a breast implant placement were discussed and include the following but not limited to these: bleeding, infection, delayed healing, anesthesia risks, skin sensation changes, injury to structures including nerves, blood vessels, and muscles which may be temporary or permanent, allergies to tape, suture materials and glues, blood products, topical preparations or injected agents, skin contour irregularities, skin discoloration and swelling, deep vein thrombosis, cardiac and pulmonary complications, pain, which may persist, fluid accumulation, wrinkling of the skin over the implant,  changes in nipple or breast sensation, implant leakage or rupture, faulty position of the implant, persistent pain, formation of tight scar tissue around the implant (capsular contracture), possible need for revisional surgery or staged procedures.      Co[place], DO 11/02/2017, 3:07 PM

## 2017-11-02 NOTE — H&P (Signed)
Cheryl [name] is an 41 y.o. female.   Chief Complaint: acquired absence of breast HPI: Cheryl [name] is a 41 yo female here for exchange surgery. [name] status  immediate bilateral breast reconstruction with TE/ADM on 04/20/17.  Mentor Aurtora 300 cc expanders currently 590/300 cc [name] now ready for exchange surgery with removal of bilateral TE and placement of bilateral silicone implants.   [name] S/P radiation on the left in 2016. We discussed that if all goes well at exchange, she might benefit from lipo-filling several months following exchange surgery to improve the result.   History:  She had two lumpectomies on the LEFT breast in 2016 followed by radiation.  She now has a new triple negative grade 2 invasive ductal carcinoma of the lower inner left breast. She underwent screening mammography 01/2015 which showed calcifications in the upper outer quadrant of the LEFT brast.  A diagnostic mammography with tomo followed by U/S was done with a biopsy showing ductal carcinoma in situ, grade 2 or 3 with possible microinvasion and ER/PR positive tumor. Bilateral breast MRI confirmed an upper outer quadrant lesion 4.5 cm with mass like enhancement and spiculation.  A RIGHT breast outer anterio lesion was found and biopsy showed a fibroadenoma.  The patient is 5 feet 2 inches tall, weight is 212 pounds. Preop bra= 38DD.  Past Medical History:  Diagnosis Date  . Breast cancer ([place])    left breast  . Wears contact lenses     Past Surgical History:  Procedure Laterality Date  . BILATERAL TOTAL MASTECTOMY WITH AXILLARY LYMPH NODE DISSECTION  04/20/2017  . BREAST BIOPSY Left 02/18/2015  . BREAST BIOPSY Right 02/18/2015  . BREAST EXCISIONAL BIOPSY Left 04/05/2015  . BREAST LUMPECTOMY Left 03/27/2015  . BREAST LUMPECTOMY WITH NEEDLE LOCALIZATION AND AXILLARY SENTINEL LYMPH NODE BX Left 03/27/2015   Procedure: LEFT BREAST LUMPECTOMY WITH BRACKETED NEEDLE LOCALIZATION AND LEFT  AXILLARY SENTINEL  LYMPH NODE BX;  Surgeon: [name], MD;  Location: [facility];  Service: General;  Laterality: Left;  . BREAST RECONSTRUCTION WITH PLACEMENT OF TISSUE EXPANDER AND FLEX HD (ACELLULAR HYDRATED DERMIS) Bilateral 04/20/2017   Procedure: BILATARAL BREAST RECONSTRUCTION WITH PLACEMENT OF TISSUE EXPANDER AND FLEX HD (ACELLULAR HYDRATED DERMIS);  Surgeon: Wallace Going, DO;  Location: [facility];  Service: Plastics;  Laterality: Bilateral;  . BREAST SURGERY    . CHOLECYSTECTOMY N/A 07/07/2017   Procedure: LAPAROSCOPIC CHOLECYSTECTOMY WITH INTRAOPERATIVE CHOLANGIOGRAM;  Surgeon: [name], MD;  Location: [facility];  Service: General;  Laterality: N/A;  . MASTECTOMY W/ SENTINEL NODE BIOPSY Bilateral 04/20/2017   Procedure: BILATERAL TOTAL MASTECTOMY WITH LEFT AXILLARY SENTINEL LYMPH NODE BIOPSY;  Surgeon: [name], MD;  Location: [facility];  Service: General;  Laterality: Bilateral;  . PORTACATH PLACEMENT Right 04/25/2015   Procedure: INSERTION PORT-A-CATH;  Surgeon: [name], MD;  Location: [facility];  Service: General;  Laterality: Right;  . PORTACATH PLACEMENT Right 04/20/2017   Procedure: INSERTION PORT-A-CATH;  Surgeon: [name], MD;  Location: [facility];  Service: General;  Laterality: Right;  . RE-EXCISION OF BREAST LUMPECTOMY Left 04/05/2015   Procedure: RE-EXCISION LEFT  BREAST LUMPECTOMY;  Surgeon: [name], MD;  Location: [facility];  Service: General;  Laterality: Left;    Family History  Problem Relation Age of Onset  . Lung cancer Maternal Grandmother 77       non smoker  . Lung cancer Maternal Grandfather 65       non  smoker worked in [employer]  . Cancer Paternal Grandfather 35       throat cancer ? smoker  . Cancer Cousin 41       throat cancer ? smoker   Social History:  reports that  has never smoked. she has never used smokeless tobacco. She reports that she drinks alcohol. She reports that she  does not use drugs.  Allergies:  Allergies  Allergen Reactions  . Adhesive [Tape] Other (See Comments)    Skin blisters     (Not in a hospital admission)  No results found for this or any previous visit (from the past 48 hour(s)). No results found.  Review of Systems  Constitutional: Negative.   HENT: Negative.   Eyes: Negative.   Respiratory: Negative.   Cardiovascular: Negative.   Genitourinary: Negative.   Musculoskeletal: Negative.   Skin: Negative.   Neurological: Negative.   Psychiatric/Behavioral: Negative.     Last menstrual period 04/02/2015. Physical Exam  Constitutional: [name] oriented to person, place, and time. She appears well-developed and well-nourished.  HENT:  Head: Normocephalic and atraumatic.  Eyes: Conjunctivae and EOM are normal. Pupils are equal, round, and reactive to light.  Cardiovascular: Normal rate.  Respiratory: Effort normal.  GI: Soft.  Neurological: [name] alert and oriented to person, place, and time.  Skin: Skin is warm.  Psychiatric: She has a normal mood and affect. Her behavior is normal. Judgment and thought content normal.     Assessment/Plan  Currently the expanders have 590/300 cc Plan exchange surgery with removal of bilateral tissue expanders and placement of bilateral silicone implants.  The risks that can be encountered with and after placement of a breast implant placement were discussed and include the following but not limited to these: bleeding, infection, delayed healing, anesthesia risks, skin sensation changes, injury to structures including nerves, blood vessels, and muscles which may be temporary or permanent, allergies to tape, suture materials and glues, blood products, topical preparations or injected agents, skin contour irregularities, skin discoloration and swelling, deep vein thrombosis, cardiac and pulmonary complications, pain, which may persist, fluid accumulation, wrinkling of the skin over the implant,  changes in nipple or breast sensation, implant leakage or rupture, faulty position of the implant, persistent pain, formation of tight scar tissue around the implant (capsular contracture), possible need for revisional surgery or staged procedures.      Woodson, DO 11/02/2017, 3:07 PM

## 2017-11-03 NOTE — Anesthesia Preprocedure Evaluation (Signed)
Anesthesia Evaluation  Patient identified by MRN, date of birth, ID band Patient awake    Reviewed: Allergy & Precautions, NPO status , Patient's Chart, lab work & pertinent test results  Airway Mallampati: II  TM Distance: >3 FB Neck ROM: Full    Dental no notable dental hx.    Pulmonary neg pulmonary ROS,    breath sounds clear to auscultation       Cardiovascular negative cardio ROS   Rhythm:Regular Rate:Normal     Neuro/Psych    GI/Hepatic negative GI ROS, Neg liver ROS,   Endo/Other  negative endocrine ROS  Renal/GU negative Renal ROS     Musculoskeletal   Abdominal   Peds  Hematology negative hematology ROS (+)   Anesthesia Other Findings   Reproductive/Obstetrics                             Anesthesia Physical  Anesthesia Plan  ASA: II  Anesthesia Plan: General   Post-op Pain Management:    Induction: Intravenous  PONV Risk Score and Plan: 4 or greater and Ondansetron, Dexamethasone, Scopolamine patch - Pre-op, Treatment may vary due to age or medical condition and Midazolam  Airway Management Planned: Oral ETT and LMA  Additional Equipment:   Intra-op Plan:   Post-operative Plan: Extubation in OR  Informed Consent: I have reviewed the patients History and Physical, chart, labs and discussed the procedure including the risks, benefits and alternatives for the proposed anesthesia with the patient or authorized representative who has indicated his/her understanding and acceptance.   Dental advisory given  Plan Discussed with:   Anesthesia Plan Comments:         Anesthesia Quick Evaluation

## 2017-11-04 ENCOUNTER — Encounter (HOSPITAL_BASED_OUTPATIENT_CLINIC_OR_DEPARTMENT_OTHER): Payer: Self-pay

## 2017-11-04 ENCOUNTER — Other Ambulatory Visit: Payer: Self-pay

## 2017-11-04 ENCOUNTER — Ambulatory Visit (HOSPITAL_BASED_OUTPATIENT_CLINIC_OR_DEPARTMENT_OTHER): Payer: 59 | Admitting: Anesthesiology

## 2017-11-04 ENCOUNTER — Encounter (HOSPITAL_BASED_OUTPATIENT_CLINIC_OR_DEPARTMENT_OTHER)
Admission: RE | Disposition: A | Discharge status: Home / Self Care | Payer: Self-pay | Source: Ambulatory Visit | Attending: Plastic Surgery

## 2017-11-04 ENCOUNTER — Ambulatory Visit (HOSPITAL_BASED_OUTPATIENT_CLINIC_OR_DEPARTMENT_OTHER)
Admission: RE | Admit: 2017-11-04 | Discharge: 2017-11-04 | Disposition: A | Discharge status: Home / Self Care | Payer: 59 | Source: Ambulatory Visit | Attending: Plastic Surgery | Admitting: Plastic Surgery

## 2017-11-04 DIAGNOSIS — Z421 Encounter for breast reconstruction following mastectomy: Secondary | ICD-10-CM | POA: Insufficient documentation

## 2017-11-04 DIAGNOSIS — Z853 Personal history of malignant neoplasm of breast: Secondary | ICD-10-CM | POA: Insufficient documentation

## 2017-11-04 DIAGNOSIS — Z9013 Acquired absence of bilateral breasts and nipples: Secondary | ICD-10-CM | POA: Diagnosis not present

## 2017-11-04 DIAGNOSIS — J40 Bronchitis, not specified as acute or chronic: Secondary | ICD-10-CM | POA: Diagnosis not present

## 2017-11-04 DIAGNOSIS — Z923 Personal history of irradiation: Secondary | ICD-10-CM | POA: Insufficient documentation

## 2017-11-04 DIAGNOSIS — C50412 Malignant neoplasm of upper-outer quadrant of left female breast: Secondary | ICD-10-CM | POA: Diagnosis not present

## 2017-11-04 DIAGNOSIS — T85898A Other specified complication of other internal prosthetic devices, implants and grafts, initial encounter: Secondary | ICD-10-CM | POA: Diagnosis not present

## 2017-11-04 HISTORY — PX: PORTA CATH REMOVAL: CATH118286

## 2017-11-04 HISTORY — PX: REMOVAL OF TISSUE EXPANDER AND PLACEMENT OF IMPLANT: SHX6457

## 2017-11-04 SURGERY — REMOVAL, TISSUE EXPANDER, BREAST, WITH IMPLANT INSERTION
Anesthesia: General | Site: Neck | Laterality: Right

## 2017-11-04 MED ORDER — SUCCINYLCHOLINE CHLORIDE 200 MG/10ML IV SOSY
PREFILLED_SYRINGE | INTRAVENOUS | Status: AC
  Filled 2017-11-04: qty 10

## 2017-11-04 MED ORDER — OXYCODONE HCL 5 MG PO TABS
5.0000 mg | ORAL_TABLET | ORAL | Status: DC | PRN
  Administered 2017-11-04: 10 mg via ORAL

## 2017-11-04 MED ORDER — ONDANSETRON HCL 4 MG/2ML IJ SOLN
INTRAMUSCULAR | Status: AC
  Filled 2017-11-04: qty 2

## 2017-11-04 MED ORDER — PHENYLEPHRINE 40 MCG/ML (10ML) SYRINGE FOR IV PUSH (FOR BLOOD PRESSURE SUPPORT)
PREFILLED_SYRINGE | INTRAVENOUS | Status: AC
  Filled 2017-11-04: qty 10

## 2017-11-04 MED ORDER — BUPIVACAINE-EPINEPHRINE 0.25% -1:200000 IJ SOLN
INTRAMUSCULAR | Status: DC | PRN
  Administered 2017-11-04: 10 mL

## 2017-11-04 MED ORDER — LIDOCAINE 2% (20 MG/ML) 5 ML SYRINGE
INTRAMUSCULAR | Status: AC
  Filled 2017-11-04: qty 5

## 2017-11-04 MED ORDER — FENTANYL CITRATE (PF) 100 MCG/2ML IJ SOLN
INTRAMUSCULAR | Status: AC
  Filled 2017-11-04: qty 2

## 2017-11-04 MED ORDER — ONDANSETRON HCL 4 MG/2ML IJ SOLN
4.0000 mg | Freq: Once | INTRAMUSCULAR | Status: DC | PRN
Start: 2017-11-04 — End: 2017-11-04

## 2017-11-04 MED ORDER — PHENYLEPHRINE HCL 10 MG/ML IJ SOLN
INTRAMUSCULAR | Status: DC | PRN
  Administered 2017-11-04: 120 ug via INTRAVENOUS
  Administered 2017-11-04: 80 ug via INTRAVENOUS

## 2017-11-04 MED ORDER — OXYCODONE HCL 5 MG PO TABS
ORAL_TABLET | ORAL | Status: AC
  Filled 2017-11-04: qty 2

## 2017-11-04 MED ORDER — ACETAMINOPHEN 325 MG PO TABS: 650.0000 mg | ORAL_TABLET | ORAL | Status: DC | PRN

## 2017-11-04 MED ORDER — MORPHINE SULFATE (PF) 4 MG/ML IV SOLN
INTRAVENOUS | Status: AC
  Filled 2017-11-04: qty 1

## 2017-11-04 MED ORDER — PHENYLEPHRINE HCL 10 MG/ML IJ SOLN
INTRAMUSCULAR | Status: AC
  Filled 2017-11-04: qty 1

## 2017-11-04 MED ORDER — ONDANSETRON HCL 4 MG/2ML IJ SOLN: 4.0000 mg | Freq: Once | INTRAMUSCULAR | Status: DC | PRN

## 2017-11-04 MED ORDER — ONDANSETRON HCL 4 MG/2ML IJ SOLN: INTRAMUSCULAR | Status: DC | PRN

## 2017-11-04 MED ORDER — SODIUM CHLORIDE 0.9% FLUSH: 3.0000 mL | Freq: Two times a day (BID) | INTRAVENOUS | Status: DC

## 2017-11-04 MED ORDER — FENTANYL CITRATE (PF) 100 MCG/2ML IJ SOLN: 25.0000 ug | INTRAMUSCULAR | Status: DC | PRN

## 2017-11-04 MED ORDER — MEPERIDINE HCL 25 MG/ML IJ SOLN: 6.2500 mg | INTRAMUSCULAR | Status: DC | PRN

## 2017-11-04 MED ORDER — EPHEDRINE 5 MG/ML INJ
INTRAVENOUS | Status: AC
  Filled 2017-11-04: qty 10

## 2017-11-04 MED ORDER — DEXAMETHASONE SODIUM PHOSPHATE 10 MG/ML IJ SOLN
INTRAMUSCULAR | Status: AC
Start: 2017-11-04 — End: ?
  Filled 2017-11-04: qty 1

## 2017-11-04 MED ORDER — SCOPOLAMINE 1 MG/3DAYS TD PT72: 1.0000 | MEDICATED_PATCH | Freq: Once | TRANSDERMAL | Status: DC | PRN

## 2017-11-04 MED ORDER — BUPIVACAINE-EPINEPHRINE (PF) 0.25% -1:200000 IJ SOLN
INTRAMUSCULAR | Status: AC
  Filled 2017-11-04: qty 60

## 2017-11-04 MED ORDER — DEXAMETHASONE SODIUM PHOSPHATE 4 MG/ML IJ SOLN
INTRAMUSCULAR | Status: DC | PRN
  Administered 2017-11-04: 10 mg via INTRAVENOUS

## 2017-11-04 MED ORDER — PROPOFOL 10 MG/ML IV BOLUS
INTRAVENOUS | Status: DC | PRN
Start: 2017-11-04 — End: 2017-11-04
  Administered 2017-11-04: 200 mg via INTRAVENOUS

## 2017-11-04 MED ORDER — ACETAMINOPHEN 650 MG RE SUPP: 650.0000 mg | RECTAL | Status: DC | PRN

## 2017-11-04 MED ORDER — SODIUM CHLORIDE 0.9% FLUSH: 3.0000 mL | INTRAVENOUS | Status: DC | PRN

## 2017-11-04 MED ORDER — ONDANSETRON HCL 4 MG/2ML IJ SOLN
INTRAMUSCULAR | Status: DC | PRN
  Administered 2017-11-04: 4 mg via INTRAVENOUS

## 2017-11-04 MED ORDER — LIDOCAINE 2% (20 MG/ML) 5 ML SYRINGE
INTRAMUSCULAR | Status: DC | PRN
  Administered 2017-11-04: 100 mg via INTRAVENOUS

## 2017-11-04 MED ORDER — FENTANYL CITRATE (PF) 100 MCG/2ML IJ SOLN
50.0000 ug | INTRAMUSCULAR | Status: AC | PRN
  Administered 2017-11-04 (×6): 50 ug via INTRAVENOUS
  Administered 2017-11-04: 100 ug via INTRAVENOUS

## 2017-11-04 MED ORDER — SODIUM CHLORIDE 0.9 % IV SOLN
INTRAVENOUS | Status: DC | PRN
  Administered 2017-11-04: 500 mL

## 2017-11-04 MED ORDER — FENTANYL CITRATE (PF) 100 MCG/2ML IJ SOLN
25.0000 ug | INTRAMUSCULAR | Status: DC | PRN
Start: 2017-11-04 — End: 2017-11-04
  Administered 2017-11-04 (×2): 50 ug via INTRAVENOUS

## 2017-11-04 MED ORDER — LACTATED RINGERS IV SOLN
INTRAVENOUS | Status: DC
  Administered 2017-11-04 (×3): via INTRAVENOUS

## 2017-11-04 MED ORDER — MIDAZOLAM HCL 2 MG/2ML IJ SOLN
1.0000 mg | INTRAMUSCULAR | Status: DC | PRN
  Administered 2017-11-04: 2 mg via INTRAVENOUS

## 2017-11-04 MED ORDER — MORPHINE SULFATE (PF) 0.5 MG/ML IJ SOLN
INTRAMUSCULAR | Status: DC | PRN
  Administered 2017-11-04 (×2): 2 mg via EPIDURAL

## 2017-11-04 MED ORDER — MIDAZOLAM HCL 2 MG/2ML IJ SOLN
INTRAMUSCULAR | Status: AC
Start: 2017-11-04 — End: ?
  Filled 2017-11-04: qty 2

## 2017-11-04 MED ORDER — SODIUM CHLORIDE 0.9 % IV SOLN: 250.0000 mL | INTRAVENOUS | Status: DC | PRN

## 2017-11-04 MED ORDER — CEFAZOLIN SODIUM-DEXTROSE 2-4 GM/100ML-% IV SOLN
2.0000 g | INTRAVENOUS | Status: AC
  Administered 2017-11-04 (×2): 2 g via INTRAVENOUS

## 2017-11-04 MED ORDER — CEFAZOLIN SODIUM-DEXTROSE 2-4 GM/100ML-% IV SOLN
INTRAVENOUS | Status: AC
  Filled 2017-11-04: qty 100

## 2017-11-04 SURGICAL SUPPLY — 63 items
BAG DECANTER FOR FLEXI CONT (MISCELLANEOUS) ×3 IMPLANT
BINDER BREAST LRG (GAUZE/BANDAGES/DRESSINGS) ×3 IMPLANT
BINDER BREAST MEDIUM (GAUZE/BANDAGES/DRESSINGS) IMPLANT
BINDER BREAST XLRG (GAUZE/BANDAGES/DRESSINGS) IMPLANT
BINDER BREAST XXLRG (GAUZE/BANDAGES/DRESSINGS) IMPLANT
BIOPATCH RED 1 DISK 7.0 (GAUZE/BANDAGES/DRESSINGS) IMPLANT
BLADE SURG 15 STRL LF DISP TIS (BLADE) ×2 IMPLANT
BLADE SURG 15 STRL SS (BLADE) ×1
BNDG GAUZE ELAST 4 BULKY (GAUZE/BANDAGES/DRESSINGS) ×6 IMPLANT
CANISTER SUCT 1200ML W/VALVE (MISCELLANEOUS) ×6 IMPLANT
CHLORAPREP W/TINT 26ML (MISCELLANEOUS) ×3 IMPLANT
COVER BACK TABLE 60X90IN (DRAPES) ×3 IMPLANT
COVER MAYO STAND STRL (DRAPES) ×3 IMPLANT
DECANTER SPIKE VIAL GLASS SM (MISCELLANEOUS) ×3 IMPLANT
DERMABOND ADVANCED (GAUZE/BANDAGES/DRESSINGS) ×3
DERMABOND ADVANCED .7 DNX12 (GAUZE/BANDAGES/DRESSINGS) ×6 IMPLANT
DRAIN CHANNEL 19F RND (DRAIN) IMPLANT
DRAPE LAPAROSCOPIC ABDOMINAL (DRAPES) ×3 IMPLANT
DRSG PAD ABDOMINAL 8X10 ST (GAUZE/BANDAGES/DRESSINGS) ×6 IMPLANT
DRSG TEGADERM 2-3/8X2-3/4 SM (GAUZE/BANDAGES/DRESSINGS) ×3 IMPLANT
ELECT BLADE 4.0 EZ CLEAN MEGAD (MISCELLANEOUS) ×3
ELECT COATED BLADE 2.86 ST (ELECTRODE) ×3 IMPLANT
ELECT REM PT RETURN 9FT ADLT (ELECTROSURGICAL) ×3
ELECTRODE BLDE 4.0 EZ CLN MEGD (MISCELLANEOUS) ×2 IMPLANT
ELECTRODE REM PT RTRN 9FT ADLT (ELECTROSURGICAL) ×2 IMPLANT
EVACUATOR SILICONE 100CC (DRAIN) IMPLANT
GAUZE SPONGE 2X2 12PLY NS (GAUZE/BANDAGES/DRESSINGS) ×3 IMPLANT
GAUZE SPONGE 4X4 12PLY STRL LF (GAUZE/BANDAGES/DRESSINGS) IMPLANT
GLOVE BIO SURGEON STRL SZ 6.5 (GLOVE) ×6 IMPLANT
GOWN STRL REUS W/ TWL LRG LVL3 (GOWN DISPOSABLE) ×4 IMPLANT
GOWN STRL REUS W/TWL LRG LVL3 (GOWN DISPOSABLE) ×2
IMPL GEL HP 590CC (Breast) ×4 IMPLANT
IMPLANT GEL HP 590CC (Breast) ×6 IMPLANT
IV NS 1000ML (IV SOLUTION)
IV NS 1000ML BAXH (IV SOLUTION) IMPLANT
NDL SAFETY ECLIPSE 18X1.5 (NEEDLE) ×2 IMPLANT
NEEDLE HYPO 18GX1.5 SHARP (NEEDLE) ×1
NEEDLE HYPO 25X1 1.5 SAFETY (NEEDLE) ×3 IMPLANT
PACK BASIN DAY SURGERY FS (CUSTOM PROCEDURE TRAY) ×3 IMPLANT
PENCIL BUTTON HOLSTER BLD 10FT (ELECTRODE) ×3 IMPLANT
PIN SAFETY STERILE (MISCELLANEOUS) IMPLANT
SIZER BREAST REUSE 590CC (SIZER) ×3
SIZER BRST REUSE 590CC (SIZER) ×2 IMPLANT
SLEEVE SCD COMPRESS KNEE MED (MISCELLANEOUS) ×3 IMPLANT
SPONGE LAP 18X18 X RAY DECT (DISPOSABLE) ×6 IMPLANT
SUT MNCRL AB 3-0 PS2 18 (SUTURE) ×6 IMPLANT
SUT MNCRL AB 4-0 PS2 18 (SUTURE) ×6 IMPLANT
SUT MON AB 3-0 SH 27 (SUTURE) ×1
SUT MON AB 3-0 SH27 (SUTURE) ×2 IMPLANT
SUT MON AB 5-0 PS2 18 (SUTURE) ×3 IMPLANT
SUT PDS 3-0 CT2 (SUTURE)
SUT PDS AB 2-0 CT2 27 (SUTURE) IMPLANT
SUT PDS II 3-0 CT2 27 ABS (SUTURE) IMPLANT
SUT SILK 3 0 PS 1 (SUTURE) IMPLANT
SUT VIC AB 3-0 SH 27 (SUTURE) ×2
SUT VIC AB 3-0 SH 27X BRD (SUTURE) ×4 IMPLANT
SUT VICRYL 4-0 PS2 18IN ABS (SUTURE) ×6 IMPLANT
SYR BULB IRRIGATION 50ML (SYRINGE) ×3 IMPLANT
SYR CONTROL 10ML LL (SYRINGE) IMPLANT
TOWEL OR 17X24 6PK STRL BLUE (TOWEL DISPOSABLE) ×6 IMPLANT
TUBE CONNECTING 20X1/4 (TUBING) ×3 IMPLANT
UNDERPAD 30X30 (UNDERPADS AND DIAPERS) ×6 IMPLANT
YANKAUER SUCT BULB TIP NO VENT (SUCTIONS) ×3 IMPLANT

## 2017-11-04 NOTE — Interval H&P Note (Signed)
History and Physical Interval Note:  11/04/2017 8:29 AM  Cheryl [name]  has presented today for surgery, with the diagnosis of ACQUIRED ABSENT OF BILATERAL BREAST AND NIPPLE, MALIGNANT NEOPLASM OF UPPER OUTER QUADANT OF LEFT BREAST,  The various methods of treatment have been discussed with the patient and family. After consideration of risks, benefits and other options for treatment, the patient has consented to  Procedure(s): BILATERAL REMOVAL OF TISSUE EXPANDER AND PLACEMENT OF  BILATERAL BREAST IMPLANT (Bilateral) as a surgical intervention .  The patient's history has been reviewed, patient examined, no change in status, stable for surgery.  I have reviewed the patient's chart and labs.  Questions were answered to the patient's satisfaction.     [name] Dillingham

## 2017-11-04 NOTE — Anesthesia Postprocedure Evaluation (Signed)
Anesthesia Post Note  Patient: Cheryl [name]  Procedure(s) Performed: BILATERAL REMOVAL OF TISSUE EXPANDER AND PLACEMENT OF  BILATERAL BREAST IMPLANT (Bilateral Breast) PORTA CATH REMOVAL (Right Neck)     Patient location during evaluation: PACU Anesthesia Type: General Level of consciousness: awake and alert Pain management: pain level controlled Vital Signs Assessment: post-procedure vital signs reviewed and stable Respiratory status: spontaneous breathing, nonlabored ventilation, respiratory function stable and patient connected to nasal cannula oxygen Cardiovascular status: blood pressure returned to baseline and stable Postop Assessment: no apparent nausea or vomiting Anesthetic complications: no    Last Vitals:  Vitals:   11/04/17 1130 11/04/17 1145  BP: 138/81 127/74  Pulse: 96 98  Resp: 14 19  Temp:    SpO2: 100% 97%    Last Pain:  Vitals:   11/04/17 1127  TempSrc:   PainSc: 0-No pain                 Quince Santana

## 2017-11-04 NOTE — Discharge Instructions (Signed)
Took Oxycodone 10 mg @ 1250 after AVS printed. Pt instructed on when ok to take next dose and written in on pt copy of AVS.   May shower tomorrow. Continue the binder or sports bra. No heavy lifting.   Post Anesthesia Home Care Instructions  Activity: Get plenty of rest for the remainder of the day. A responsible individual must stay with you for 24 hours following the procedure.  For the next 24 hours, DO NOT: -Drive a car -Paediatric nurse -Drink alcoholic beverages -Take any medication unless instructed by your physician -Make any legal decisions or sign important papers.  Meals: Start with liquid foods such as gelatin or soup. Progress to regular foods as tolerated. Avoid greasy, spicy, heavy foods. If nausea and/or vomiting occur, drink only clear liquids until the nausea and/or vomiting subsides. Call your physician if vomiting continues.  Special Instructions/Symptoms: Your throat may feel dry or sore from the anesthesia or the breathing tube placed in your throat during surgery. If this causes discomfort, gargle with warm salt water. The discomfort should disappear within 24 hours.  If you had a scopolamine patch placed behind your ear for the management of post- operative nausea and/or vomiting:  1. The medication in the patch is effective for 72 hours, after which it should be removed.  Wrap patch in a tissue and discard in the trash. Wash hands thoroughly with soap and water. 2. You may remove the patch earlier than 72 hours if you experience unpleasant side effects which may include dry mouth, dizziness or visual disturbances. 3. Avoid touching the patch. Wash your hands with soap and water after contact with the patch.

## 2017-11-04 NOTE — Transfer of Care (Signed)
Immediate Anesthesia Transfer of Care Note  Patient: Cheryl [name]  Procedure(s) Performed: BILATERAL REMOVAL OF TISSUE EXPANDER AND PLACEMENT OF  BILATERAL BREAST IMPLANT (Bilateral Breast) PORTA CATH REMOVAL (Right Neck)  Patient Location: PACU  Anesthesia Type:General  Level of Consciousness: awake, alert  and oriented  Airway & Oxygen Therapy: Patient Spontanous Breathing and Patient connected to face mask oxygen  Post-op Assessment: Report given to RN and Post -op Vital signs reviewed and stable  Post vital signs: Reviewed and stable  Last Vitals:  Vitals:   11/04/17 0746  BP: (!) 143/87  Pulse: 95  Resp: 18  Temp: 36.8 C  SpO2: 100%    Last Pain:  Vitals:   11/04/17 0746  TempSrc: Oral         Complications: No apparent anesthesia complications

## 2017-11-04 NOTE — Anesthesia Procedure Notes (Signed)
Procedure Name: LMA Insertion Date/Time: 11/04/2017 9:48 AM Performed by: Willa Frater, CRNA Pre-anesthesia Checklist: Patient identified, Emergency Drugs available, Suction available and Patient being monitored Patient Re-evaluated:Patient Re-evaluated prior to induction Oxygen Delivery Method: Circle system utilized Preoxygenation: Pre-oxygenation with 100% oxygen Induction Type: IV induction Ventilation: Mask ventilation without difficulty LMA: LMA inserted LMA Size: 4.0 Number of attempts: 1 Airway Equipment and Method: Bite block Placement Confirmation: positive ETCO2 Tube secured with: Tape Dental Injury: Teeth and Oropharynx as per pre-operative assessment

## 2017-11-04 NOTE — Op Note (Signed)
Op report Bilateral Exchange   DATE OF OPERATION: 11/04/2017  LOCATION: [facility]  SURGICAL DIVISION: Plastic Surgery  PREOPERATIVE DIAGNOSES:  1.History of left breast cancer.  2. Acquired absence of bilateral breast.   POSTOPERATIVE DIAGNOSES:  1. History of left breast cancer.  2. Acquired absence of bilateral breast.   PROCEDURE:  1. Bilateral exchange of tissue expanders for implants.  2. Bilateral capsulectomies for implant respositioning.  SURGEON: Claire Sanger Dillingham, DO  ANESTHESIA:  General.   COMPLICATIONS: None.   IMPLANTS: Left - Mentor Smooth Round ultra High Profile Gel 590cc. Ref #[id number].  Serial Number [id number] Right - Mentor Smooth Round Ultra High Profile Gel 590cc. Ref #[id number].  Serial Number [id number]  INDICATIONS FOR PROCEDURE:  The patient, Cheryl [name], is a 41 y.o. female born on October 05, 1976, is here for treatment after bilateral mastectomies.  She had tissue expanders placed at the time of mastectomies. She now presents for exchange of her expanders for implants.  She requires capsulotomies to better position the implants. MRN: [id number]  CONSENT:  Informed consent was obtained directly from the patient. Risks, benefits and alternatives were fully discussed. Specific risks including but not limited to bleeding, infection, hematoma, seroma, scarring, pain, implant infection, implant extrusion, capsular contracture, asymmetry, wound healing problems, and need for further surgery were all discussed. The patient did have an ample opportunity to have her questions answered to her satisfaction.   DESCRIPTION OF PROCEDURE:  The patient was taken to the operating room. SCDs were placed and IV antibiotics were given. The patient's chest was prepped and draped in a sterile fashion. A time out was performed and the implants to be used were identified.    On the Left breast: One percent Lidocaine with epinephrine was  used to infiltrate at the incision site. The old mastectomy scar was excised.  The mastectomy flaps from the superior and inferior flaps were raised over the pectoralis major muscle for several centimeters to minimize tension for the closure. The pectoralis was split inferior to the skin incision to expose and remove the tissue expander.  Inspection of the pocket showed a normal healthy capsule and good integration of the biologic matrix.  The pocket was irrigated with antibiotic solution.  Circumferential capsulotomies were performed to allow for breast pocket expansion.  The pocket was tight and required excision of the capsule medially and laterally for expansion of the creases.  Measurements were made and a sizer used to confirm adequate pocket size for the implant dimensions.  Hemostasis was ensured with electrocautery. New gloves were placed. The implant was soaked in antibiotic solution and then placed in the pocket and oriented appropriately. The pectoralis major muscle and capsule on the anterior surface were re-closed with a 3-0 Monocryl suture. The remaining skin was closed with 4-0 Monocryl deep dermal and 5-0 Monocryl subcuticular stitches.   On the right breast: The old mastectomy scar was excised.  The mastectomy flaps from the superior and inferior flaps were raised over the pectoralis major muscle for several centimeters to minimize tension for the closure. The pectoralis was split inferior to the skin incision to expose and remove the tissue expander.  Inspection of the pocket showed a normal healthy capsule and good integration of the biologic matrix.   Circumferential capsulotomies were performed to allow for breast pocket expansion.  The pocket was tight and required excision of the capsule medially and laterally for expansion of the creases. Measurements were made and a sizer utilized to confirm  adequate pocket size for the implant dimensions.  Hemostasis was ensured with the  electrocautery.  New gloves were applied. The implant was soaked in antibiotic solution and placed in the pocket and oriented appropriately. The pectoralis major muscle and capsule on the anterior surface were re-closed with a 3-0 Monocryl suture. The remaining skin was closed with 4-0 Monocryl deep dermal and 5-0 Monocryl subcuticular stitches.  Dermabond was applied to the incision site. A breast binder and ABDs were placed.  The patient was allowed to wake from anesthesia and taken to the recovery room in satisfactory condition.

## 2017-11-05 ENCOUNTER — Encounter (HOSPITAL_BASED_OUTPATIENT_CLINIC_OR_DEPARTMENT_OTHER): Payer: Self-pay | Admitting: Plastic Surgery

## 2017-12-03 ENCOUNTER — Other Ambulatory Visit: Payer: Self-pay | Admitting: *Deleted

## 2017-12-03 DIAGNOSIS — Z171 Estrogen receptor negative status [ER-]: Secondary | ICD-10-CM

## 2017-12-03 DIAGNOSIS — K7689 Other specified diseases of liver: Secondary | ICD-10-CM

## 2017-12-03 DIAGNOSIS — C50412 Malignant neoplasm of upper-outer quadrant of left female breast: Secondary | ICD-10-CM

## 2017-12-03 DIAGNOSIS — C50912 Malignant neoplasm of unspecified site of left female breast: Secondary | ICD-10-CM

## 2017-12-06 ENCOUNTER — Other Ambulatory Visit: Payer: Self-pay | Admitting: *Deleted

## 2017-12-09 ENCOUNTER — Encounter: Payer: Self-pay | Admitting: Adult Health

## 2017-12-13 ENCOUNTER — Encounter: Payer: Self-pay | Admitting: Oncology

## 2017-12-16 ENCOUNTER — Telehealth: Payer: Self-pay

## 2017-12-16 NOTE — Telephone Encounter (Signed)
Opened in error

## 2017-12-22 ENCOUNTER — Telehealth: Payer: Self-pay | Admitting: *Deleted

## 2017-12-22 NOTE — Telephone Encounter (Signed)
Per MD - no contact from insurance carrier regarding need for Peer to Peer ( this RN had contacted them and arranged an appointment for call to MD cell number )- request is to cancel PET at this time- proceed with visit which may give additional data for proceeding to PET and or other scans.  This RN attempted to contact the patient and obtained number identified VM - detailed message left per above as well as to call if further questions - otherwise continue with MD appointment as scheduled.  This RN then contacted Central Scheduling and cancelled PET.

## 2017-12-23 ENCOUNTER — Other Ambulatory Visit: Payer: Self-pay | Admitting: Oncology

## 2017-12-24 ENCOUNTER — Ambulatory Visit (HOSPITAL_COMMUNITY): Payer: 59

## 2017-12-24 NOTE — Progress Notes (Signed)
[place]  Telephone:([phone] Fax:([phone]     ID: Cheryl [name] DOB: [birth year]  MR#: [id number]  [id number]#:[id number]  Patient Care Team: [facility], [facility] as PCP - General (Family Medicine) [name], MD as Consulting Physician (Obstetrics and Gynecology) [name], MD as Consulting Physician (General Surgery) Cesilia Shinn, Virgie Dad, MD as Consulting Physician (Oncology) [name], RN as Registered Nurse [name], RN as Registered Nurse Dillingham, [name], DO as Attending Physician (Plastic Surgery) PCP: [facility], [facility] OTHER MD:  CHIEF COMPLAINT: Recurrent triple negative breast cancer  CURRENT TREATMENT: Anastrozole   INTERVAL HISTORY:  Cheryl [name] returns today for follow up and treatment of her recurrent triple negative breast cancer.  She completed her reconstruction on 11/04/2018.  She had some redness and stiffness on the left side requiring a couple of weeks of antibiotics.  She was supposed to have had her final cycle of chemotherapy on 11/11/2017 but that had to be omitted.   REVIEW OF SYSTEMS: Ravynn reports that she had some complications from her reconstruction surgery. She notes that her left breast was red and she took antibiotics. She notes that she left breast feels tight and is taking longer to heal than the right breast. She notes that she walks a mile 3 days per week. She notes that she does house work and yard work on the weekends. She reports that her family is doing well, and she continues to work. She denies unusual headaches, visual changes, nausea, vomiting, or dizziness. There has been no unusual cough, phlegm production, or pleurisy. This been no change in bowel or bladder habits. She denies unexplained fatigue or unexplained weight loss, bleeding, rash, or fever. A detailed review of systems was otherwise stable.    HISTORY OF RECURRENT DISEASE: From my 03/18/2017  note:  Cheryl [name] that he'll returns today for follow-up of her triple negative breast cancer, which is now recurrent. She had routine bilateral diagnostic mammography at the [facility] 03/10/2017 and this showed the breast density to be category C. The old lumpectomy site, which was in the upper outer quadrant, appears stable. However there was a new area of asymmetry in the lower inner quadrant of the left breast. Ultrasound confirmed this to be a 1.0 cm mass at the 7:00 radiant 5 cm from the nipple. Aspiration was attempted but no fluid was aspirated and biopsy of this mass was obtained 03/05/2017. This showed ([id number]) invasive mammary carcinoma, grade 2, estrogen and progesterone receptor negative, HER-2 not amplified with a signals ratio 1.17 and a number per cell of 2.45, with an MIB-1 of 15%.  [name] here today to discuss subsequent management   BREAST CANCER HISTORY: From the original intake note:  The patient had screening mammography 02/01/2015 which showed a calcifications in the upper outer quadrant of the left breast. On 02/08/2015 she underwent left diagnostic mammography with tomosynthesis and left breast ultrasonography at the breast Center. The breast density was category C. Mammography confirmed a group of pleomorphic calcifications spanning 4.2 cm maximally. Physical examination of the upper outer quadrant of the left breast showed an area of thickening at the approximate 2:00 position. Ultrasound of this area showed no discrete masses. There was vague shadowing present. Calcifications could not be clearly identified sonographically. There was no lymphadenopathy in the left axilla.  On the same day the patient underwent biopsy of the left breast area in question, with the pathology (S [id number]) showing ductal carcinoma in situ,  grade 2 or 3, with possible areas of microinvasion, the cancer cells being strongly estrogen and progesterone receptor positive, both at 100%.  On  02/15/2015 the patient underwent bilateral breast MRI. This showed the area of malignancy in the upper outer quadrant to measure 4.5 cm, including a large area of non-masslike enhancement and a 1 cm spiculated mass. There was also an indeterminate oval mass in the central left breast and another indeterminant mass in the slightly outer right breast anteriorly. Biopsy of the right breast mass 02/18/2015 ([id number]) showed a fibroadenoma, with no evidence of malignancy. Biopsy of 2 additional areas of the left breast (at 10:00 and 2:00) showed a fibroadenoma in the upper inner quadrant, and pseudo-angiomatous stromal hyperplasia (PA SH) at the 2:00 area of the left breast.  The patient's subsequent history is as detailed below.   PAST MEDICAL HISTORY: Past Medical History:  Diagnosis Date  . Breast cancer ([place])    left breast  . Wears contact lenses     PAST SURGICAL HISTORY: Past Surgical History:  Procedure Laterality Date  . BILATERAL TOTAL MASTECTOMY WITH AXILLARY LYMPH NODE DISSECTION  04/20/2017  . BREAST BIOPSY Left 02/18/2015  . BREAST BIOPSY Right 02/18/2015  . BREAST EXCISIONAL BIOPSY Left 04/05/2015  . BREAST LUMPECTOMY Left 03/27/2015  . BREAST LUMPECTOMY WITH NEEDLE LOCALIZATION AND AXILLARY SENTINEL LYMPH NODE BX Left 03/27/2015   Procedure: LEFT BREAST LUMPECTOMY WITH BRACKETED NEEDLE LOCALIZATION AND LEFT  AXILLARY SENTINEL LYMPH NODE BX;  Surgeon: [name], MD;  Location: [facility];  Service: General;  Laterality: Left;  . BREAST RECONSTRUCTION WITH PLACEMENT OF TISSUE EXPANDER AND FLEX HD (ACELLULAR HYDRATED DERMIS) Bilateral 04/20/2017   Procedure: BILATARAL BREAST RECONSTRUCTION WITH PLACEMENT OF TISSUE EXPANDER AND FLEX HD (ACELLULAR HYDRATED DERMIS);  Surgeon: Wallace Going, DO;  Location: [facility];  Service: Plastics;  Laterality: Bilateral;  . BREAST SURGERY    . CHOLECYSTECTOMY N/A 07/07/2017   Procedure: LAPAROSCOPIC CHOLECYSTECTOMY WITH  INTRAOPERATIVE CHOLANGIOGRAM;  Surgeon: [name], MD;  Location: [facility];  Service: General;  Laterality: N/A;  . MASTECTOMY W/ SENTINEL NODE BIOPSY Bilateral 04/20/2017   Procedure: BILATERAL TOTAL MASTECTOMY WITH LEFT AXILLARY SENTINEL LYMPH NODE BIOPSY;  Surgeon: [name], MD;  Location: [facility];  Service: General;  Laterality: Bilateral;  . PORTA CATH REMOVAL Right 11/04/2017   Procedure: PORTA CATH REMOVAL;  Surgeon: Wallace Going, DO;  Location: [facility];  Service: Plastics;  Laterality: Right;  . PORTACATH PLACEMENT Right 04/25/2015   Procedure: INSERTION PORT-A-CATH;  Surgeon: [name], MD;  Location: [facility];  Service: General;  Laterality: Right;  . PORTACATH PLACEMENT Right 04/20/2017   Procedure: INSERTION PORT-A-CATH;  Surgeon: [name], MD;  Location: [facility];  Service: General;  Laterality: Right;  . RE-EXCISION OF BREAST LUMPECTOMY Left 04/05/2015   Procedure: RE-EXCISION LEFT  BREAST LUMPECTOMY;  Surgeon: [name], MD;  Location: [facility];  Service: General;  Laterality: Left;  . REMOVAL OF TISSUE EXPANDER AND PLACEMENT OF IMPLANT Bilateral 11/04/2017   Procedure: BILATERAL REMOVAL OF TISSUE EXPANDER AND PLACEMENT OF  BILATERAL BREAST IMPLANT;  Surgeon: Wallace Going, DO;  Location: [facility];  Service: Plastics;  Laterality: Bilateral;    FAMILY HISTORY Family History  Problem Relation Age of Onset  . Lung cancer Maternal Grandmother 56       non smoker  . Lung cancer Maternal Grandfather 42       non smoker worked  in coal mines  . Cancer Paternal Grandfather 35       throat cancer ? smoker  . Cancer Cousin 41       throat cancer ? smoker   the patient's parents are living, in their mid-50s as of March 2016. The patient had no siblings. Both the patient's mother's parents died from lung cancer although they did not smoke. [place] did [place] and her maternal grandfather was a Ecologist. There is no history of breast or ovarian cancer in the family to her knowledge  GYNECOLOGIC HISTORY:  Patient's last menstrual period was 04/02/2015. Menarche age 26, first live birth age 32. The patient is GX P3. [name] having regular periods. She uses the Essure device for contraception.  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 Her husband [name] is a Freight forwarder. Their daughter [name] lives in [place] where she works in Press photographer. Daughter Summer is a Research scientist (physical sciences) and lives with the patient.. Daughter [name] also at home is 45 years old.    ADVANCED DIRECTIVES: Not in place   HEALTH MAINTENANCE: Social History   Tobacco Use  . Smoking status: Never Smoker  . Smokeless tobacco: Never Used  Substance Use Topics  . Alcohol use: Yes    Alcohol/week: 0.0 oz    Comment: occ  . Drug use: No     Colonoscopy:  PAP:  Bone density:  Lipid panel:  Allergies  Allergen Reactions  . Adhesive [Tape] Other (See Comments)    Skin blisters    Current Outpatient Medications  Medication Sig Dispense Refill  . anastrozole (ARIMIDEX) 1 MG tablet Take 1 tablet (1 mg total) by mouth daily. 90 tablet 4  . gabapentin (NEURONTIN) 300 MG capsule Take 1 capsule (300 mg total) by mouth at bedtime. 90 capsule 4   No current facility-administered medications for this visit.     OBJECTIVE:  Young white woman who appears stated age  42:   12/27/17 1010  BP: (!) 125/94  Pulse: (!) 104  Resp: 20  Temp: 98.6 F (37 C)  SpO2: 100%     Body mass index is 40.49 kg/m.    ECOG FS:1 - Symptomatic but completely ambulatory   Sclerae unicteric, EOMs intact Oropharynx clear and moist No cervical or supraclavicular adenopathy Lungs no rales or rhonchi Heart regular rate and rhythm Abd soft, nontender, positive bowel sounds MSK no focal spinal tenderness, no upper extremity lymphedema Neuro: nonfocal, well  oriented, appropriate affect Breasts: Status post bilateral mastectomies, with gel implants in place.  On the left there is some erythema, very minimal, and some tightness as compared to the right.  The overall symmetry is good.  Both axillae are benign.   LAB RESULTS:  CMP     Component Value Date/Time   NA 140 10/21/2017 1206   K 4.0 10/21/2017 1206   CL 101 07/24/2017 1954   CO2 24 10/21/2017 1206   GLUCOSE 98 10/21/2017 1206   BUN 6.5 (L) 10/21/2017 1206   CREATININE 0.9 10/21/2017 1206   CALCIUM 9.8 10/21/2017 1206   PROT 7.5 10/21/2017 1206   ALBUMIN 3.9 10/21/2017 1206   AST 32 10/21/2017 1206   ALT 37 10/21/2017 1206   ALKPHOS 105 10/21/2017 1206   BILITOT 0.64 10/21/2017 1206   GFRNONAA >60 07/24/2017 1954   GFRAA >60 07/24/2017 1954    INo results found for: SPEP, UPEP  Lab Results  Component Value Date  WBC 6.3 12/27/2017   NEUTROABS 3.9 12/27/2017   HGB 12.4 12/27/2017   HCT 38.4 12/27/2017   MCV 91.2 12/27/2017   PLT 318 12/27/2017      Chemistry      Component Value Date/Time   NA 140 10/21/2017 1206   K 4.0 10/21/2017 1206   CL 101 07/24/2017 1954   CO2 24 10/21/2017 1206   BUN 6.5 (L) 10/21/2017 1206   CREATININE 0.9 10/21/2017 1206      Component Value Date/Time   CALCIUM 9.8 10/21/2017 1206   ALKPHOS 105 10/21/2017 1206   AST 32 10/21/2017 1206   ALT 37 10/21/2017 1206   BILITOT 0.64 10/21/2017 1206       No results found for: LABCA2  No components found for: LABCA125  No results for input(s): INR in the last 168 hours.  Urinalysis    Component Value Date/Time   COLORURINE YELLOW (A) 07/24/2017 2158   APPEARANCEUR CLEAR (A) 07/24/2017 2158   LABSPEC 1.009 07/24/2017 2158   LABSPEC 1.020 05/30/2015 1502   PHURINE 7.0 07/24/2017 2158   GLUCOSEU NEGATIVE 07/24/2017 2158   GLUCOSEU Negative 05/30/2015 1502   HGBUR SMALL (A) 07/24/2017 2158   BILIRUBINUR NEGATIVE 07/24/2017 2158   BILIRUBINUR Negative 05/30/2015 [place] 07/24/2017 2158   PROTEINUR NEGATIVE 07/24/2017 2158   UROBILINOGEN 0.2 05/30/2015 1502   NITRITE NEGATIVE 07/24/2017 2158   LEUKOCYTESUR NEGATIVE 07/24/2017 2158   LEUKOCYTESUR Negative 05/30/2015 1502    STUDIES: No results found.  ASSESSMENT: 42 y.o. [place], [place] woman status post left breast upper outer quadrant biopsy 02/08/2015 for ductal carcinoma in situ, grade 2 or 3, strongly estrogen and progesterone receptor positive, with likely areas of microinvasion  (a) biopsy of an area in the left breast upper outer quadrant showed PASH  (b) biopsy of 2 additional questionable areas, one in each breast, showed bilateral fibroadenomas  (1) genetics testing March 2016 through the BreastNext gene panel offered by [employer] showed no deleterious mutations in ATM, BARD1, BRCA1, BRCA2, BRIP1, CDH1, CHEK2, MRE11A, MUTYH, NBN, NF1, PALB2, PTEN, RAD50, RAD51C, RAD51D, and TP53.  (2) status post left lumpectomy with sentinel lymph node sampling 03/27/2015 for a pT1c pN0, stage IA invasive ductal carcinoma, grade 3, triple negative, with an MIB-1 of 33%  (a) close margins were cleared with subsequent excision 04/05/2015.  (3) Oncotype DX score of 38 predicts a risk of 26% outside the breast recurrence within 10 years if the patient's only systemic treatment is tamoxifen for 5 years  (4) adjuvant chemotherapy started 05/02/2015 consisting of doxorubicin and cyclophosphamide in dose dense fashion 4, completed 06/13/2015, followed by paclitaxel weekly 12.   (a) Paclitaxel stopped after only 2 cycles because of persistent neuropathy.  (5) adjuvant radiation completed 11/08  (5) tamoxifen started 02/20/2015 (neoadjuvantly), discontinued 04/19/2015 so as not to overlap chemotherapy; resumed 02/07/2016, discontinued April 2018 with disease recurrence  RECURRENT DISEASE: April 2018 (6) left breast lower outer quadrant biopsy 03/05/2017 shows invasive ductal carcinoma, grade 2,  triple negative  (7) status post bilateral mastectomies and left axillary lymph node dissection 04/20/2017 showing a residual left pT2 pN0 (8 nodes removed) invasive ductal carcinoma, grade 3, with negative margins; the right breast was benign  (a) she underwent expander exchange for bilateral gel implants with capsulectomies 11/04/2017  (8) chemotherapy for her recurrence consisting of cyclophosphamide, methotrexate and fluorouracil (CMF) given every 21 days 8, started 05/13/2017  (a) eighth cycle omitted because of intercurrent plastic surgery and complications  (  9) status post cholecystectomy 07/07/2017, with benign pathology  PLAN  Abbygael has completed her active treatment.  Her left breast may need a little bit of work in the future, but overall her plastic reconstruction symmetry is very good.  She has a moderate exercise program which is much better than before.  I commended her and encouraged her to extend it.  We discussed first of all antiestrogens.  Because her cancer grew through tamoxifen, it would be prudent to switch to an aromatase inhibitor.  We discussed the possible toxicity side effects and complications of anastrozole and I went ahead and placed a prescription in for her.  She has not had a bone density so I set that up at the breast center to be done before her return visit here.  We do need to obtain restaging studies and her insurance would not allow [place] to proceed to pet at this point.  We are getting a chest CT next week.  If there are any areas that require further evaluation we will move towards a PET again.  As far as her hot flashes are concerned she tells me she got no benefit from venlafaxine in the past.  I wrote for gabapentin for her to take at bedtime.  She will see me again in approximately 3 months.  She knows to call for any other issues that may develop before that visit.   Joshuajames Moehring, Virgie Dad, MD  12/27/17 10:37 AM Medical Oncology and  Hematology [facility] [address] [place], [place] [address] Tel. [phone]    Fax. [phone]  This document serves as a record of services personally performed by Lurline Del, MD. It was created on his behalf by Sheron Nightingale, a trained medical scribe. The creation of this record is based on the scribe's personal observations and the provider's statements to them.   I have reviewed the above documentation for accuracy and completeness, and I agree with the above.

## 2017-12-27 ENCOUNTER — Telehealth: Payer: Self-pay | Admitting: Oncology

## 2017-12-27 ENCOUNTER — Inpatient Hospital Stay: Payer: 59

## 2017-12-27 ENCOUNTER — Inpatient Hospital Stay: Payer: 59 | Attending: Oncology | Admitting: Oncology

## 2017-12-27 VITALS — BP 125/94 | HR 104 | Temp 98.6°F | Resp 20 | Ht 61.0 in | Wt 214.3 lb

## 2017-12-27 DIAGNOSIS — T451X5A Adverse effect of antineoplastic and immunosuppressive drugs, initial encounter: Secondary | ICD-10-CM

## 2017-12-27 DIAGNOSIS — C773 Secondary and unspecified malignant neoplasm of axilla and upper limb lymph nodes: Secondary | ICD-10-CM | POA: Diagnosis not present

## 2017-12-27 DIAGNOSIS — C50412 Malignant neoplasm of upper-outer quadrant of left female breast: Secondary | ICD-10-CM

## 2017-12-27 DIAGNOSIS — N951 Menopausal and female climacteric states: Secondary | ICD-10-CM | POA: Diagnosis not present

## 2017-12-27 DIAGNOSIS — Z171 Estrogen receptor negative status [ER-]: Secondary | ICD-10-CM | POA: Diagnosis not present

## 2017-12-27 DIAGNOSIS — C50912 Malignant neoplasm of unspecified site of left female breast: Secondary | ICD-10-CM

## 2017-12-27 DIAGNOSIS — Z170 Estrogen receptor positive status [ER+]: Secondary | ICD-10-CM | POA: Diagnosis not present

## 2017-12-27 DIAGNOSIS — G620 Drug-induced polyneuropathy: Secondary | ICD-10-CM

## 2017-12-27 LAB — COMPREHENSIVE METABOLIC PANEL
ALBUMIN: 3.9 g/dL (ref 3.5–5.0)
ALT: 45 U/L (ref 0–55)
ANION GAP: 9 (ref 3–11)
AST: 33 U/L (ref 5–34)
Alkaline Phosphatase: 89 U/L (ref 40–150)
BUN: 13 mg/dL (ref 7–26)
CALCIUM: 9.6 mg/dL (ref 8.4–10.4)
CHLORIDE: 104 mmol/L (ref 98–109)
CO2: 28 mmol/L (ref 22–29)
Creatinine, Ser: 0.84 mg/dL (ref 0.60–1.10)
GFR calc Af Amer: >60 mL/min (ref >60)
GFR calc non Af Amer: >60 mL/min (ref >60)
GLUCOSE: 110 mg/dL (ref 70–140)
POTASSIUM: 4.2 mmol/L (ref 3.5–5.1)
SODIUM: 141 mmol/L (ref 136–145)
Total Bilirubin: 0.8 mg/dL (ref 0.2–1.2)
Total Protein: 7.3 g/dL (ref 6.4–8.3)

## 2017-12-27 LAB — CBC WITH DIFFERENTIAL/PLATELET
Basophils Absolute: 0 10*3/uL (ref 0.0–0.1)
Basophils Relative: 0 %
Eosinophils Absolute: 0.2 10*3/uL (ref 0.0–0.5)
Eosinophils Relative: 3 %
HCT: 38.4 % (ref 34.8–46.6)
HEMOGLOBIN: 12.4 g/dL (ref 11.6–15.9)
LYMPHS ABS: 1.7 10*3/uL (ref 0.9–3.3)
LYMPHS PCT: 26 %
MCH: 29.5 pg (ref 25.1–34.0)
MCHC: 32.3 g/dL (ref 31.5–36.0)
MCV: 91.2 fL (ref 79.5–101.0)
MONO ABS: 0.5 10*3/uL (ref 0.1–0.9)
MONOS PCT: 8 %
NEUTROS ABS: 3.9 10*3/uL (ref 1.5–6.5)
Neutrophils Relative %: 63 %
Platelets: 318 10*3/uL (ref 145–400)
RBC: 4.21 MIL/uL (ref 3.70–5.45)
RDW: 13.9 % (ref 11.2–14.5)
WBC: 6.3 10*3/uL (ref 3.9–10.3)

## 2017-12-27 MED ORDER — GABAPENTIN 300 MG PO CAPS: 300.0000 mg | ORAL_CAPSULE | Freq: Every day | ORAL | 4 refills | Status: DC

## 2017-12-27 MED ORDER — ANASTROZOLE 1 MG PO TABS: 1.0000 mg | ORAL_TABLET | Freq: Every day | ORAL | 4 refills | Status: DC

## 2017-12-27 NOTE — Telephone Encounter (Signed)
Gave avs and calendar for April and may °

## 2017-12-28 ENCOUNTER — Encounter: Payer: Self-pay | Admitting: Oncology

## 2017-12-31 ENCOUNTER — Other Ambulatory Visit: Payer: 59

## 2017-12-31 ENCOUNTER — Ambulatory Visit: Payer: 59 | Admitting: Oncology

## 2018-01-01 ENCOUNTER — Other Ambulatory Visit: Payer: Self-pay | Admitting: Oncology

## 2018-01-01 NOTE — Progress Notes (Signed)
[place]  [phone] Fax:([phone]) [phone]     ID: Cheryl [name] DOB: October 29, 1976  MR#: [id number]  EUM#:[id number]  Patient Care Team: [facility], [facility] as PCP - General (Family Medicine) [name], MD as Consulting Physician (Obstetrics and Gynecology) [name], MD as Consulting Physician (General Surgery) Lyrik Dockstader, Virgie Dad, MD as Consulting Physician (Oncology) [name], RN as Registered Nurse [name], RN as Registered Nurse Dillingham, [name], DO as Attending Physician (Plastic Surgery) PCP: Practice, [facility] OTHER MD:  CHIEF COMPLAINT: Recurrent triple negative breast cancer  CURRENT TREATMENT: Anastrozole   INTERVAL HISTORY:  Cheryl [name] [name] today for follow up and treatment of her recurrent triple negative breast cancer.  She completed her reconstruction on 11/04/2018.  She had some redness and stiffness on the left side requiring a couple of weeks of antibiotics.  She was supposed to have had her final cycle of chemotherapy on 11/11/2017 but that had to be omitted.   REVIEW OF SYSTEMS: Cheryl [name] reports that she had some complications from her reconstruction surgery. She notes that her left breast was red and she took antibiotics. She notes that she left breast feels tight and is taking longer to heal than the right breast. She notes that she walks a mile 3 days per week. She notes that she does house work and yard work on the weekends. She reports that her family is doing well, and she continues to work. She denies unusual headaches, visual changes, nausea, vomiting, or dizziness. There has been no unusual cough, phlegm production, or pleurisy. This been no change in bowel or bladder habits. She denies unexplained fatigue or unexplained weight loss, bleeding, rash, or fever. A detailed review of systems was otherwise stable.    HISTORY OF RECURRENT DISEASE: From my 03/18/2017  note:  Cheryl [name] today for follow-up of her triple negative breast cancer, which is now recurrent. She had routine bilateral diagnostic mammography at the [facility] 03/10/2017 and this showed the breast density to be category C. The old lumpectomy site, which was in the upper outer quadrant, appears stable. However there was a new area of asymmetry in the lower inner quadrant of the left breast. Ultrasound confirmed this to be a 1.0 cm mass at the 7:00 radiant 5 cm from the nipple. Aspiration was attempted but no fluid was aspirated and biopsy of this mass was obtained 03/05/2017. This showed ([id number]) invasive mammary carcinoma, grade 2, estrogen and progesterone receptor negative, HER-2 not amplified with a signals ratio 1.17 and a number per cell of 2.45, with an MIB-1 of 15%.  [name] here today to discuss subsequent management   BREAST CANCER HISTORY: From the original intake note:  The patient had screening mammography 02/01/2015 which showed a calcifications in the upper outer quadrant of the left breast. On 02/08/2015 she underwent left diagnostic mammography with tomosynthesis and left breast ultrasonography at the breast Center. The breast density was category C. Mammography confirmed a group of pleomorphic calcifications spanning 4.2 cm maximally. Physical examination of the upper outer quadrant of the left breast showed an area of thickening at the approximate 2:00 position. Ultrasound of this area showed no discrete masses. There was vague shadowing present. Calcifications could not be clearly identified sonographically. There was no lymphadenopathy in the left axilla.  On the same day the patient underwent biopsy of the left breast area in question, with the pathology (S [id number]) showing ductal carcinoma in situ,  grade 2 or 3, with possible areas of microinvasion, the cancer cells being strongly estrogen and progesterone receptor positive, both at 100%.  On  02/15/2015 the patient underwent bilateral breast MRI. This showed the area of malignancy in the upper outer quadrant to measure 4.5 cm, including a large area of non-masslike enhancement and a 1 cm spiculated mass. There was also an indeterminate oval mass in the central left breast and another indeterminant mass in the slightly outer right breast anteriorly. Biopsy of the right breast mass 02/18/2015 ([id number]) showed a fibroadenoma, with no evidence of malignancy. Biopsy of 2 additional areas of the left breast (at 10:00 and 2:00) showed a fibroadenoma in the upper inner quadrant, and pseudo-angiomatous stromal hyperplasia (PA SH) at the 2:00 area of the left breast.  The patient's subsequent history is as detailed below.   PAST MEDICAL HISTORY: Past Medical History:  Diagnosis Date  . Breast cancer ([place])    left breast  . Wears contact lenses     PAST SURGICAL HISTORY: Past Surgical History:  Procedure Laterality Date  . BILATERAL TOTAL MASTECTOMY WITH AXILLARY LYMPH NODE DISSECTION  04/20/2017  . BREAST BIOPSY Left 02/18/2015  . BREAST BIOPSY Right 02/18/2015  . BREAST EXCISIONAL BIOPSY Left 04/05/2015  . BREAST LUMPECTOMY Left 03/27/2015  . BREAST LUMPECTOMY WITH NEEDLE LOCALIZATION AND AXILLARY SENTINEL LYMPH NODE BX Left 03/27/2015   Procedure: LEFT BREAST LUMPECTOMY WITH BRACKETED NEEDLE LOCALIZATION AND LEFT  AXILLARY SENTINEL LYMPH NODE BX;  Surgeon: [name], MD;  Location: [facility];  Service: General;  Laterality: Left;  . BREAST RECONSTRUCTION WITH PLACEMENT OF TISSUE EXPANDER AND FLEX HD (ACELLULAR HYDRATED DERMIS) Bilateral 04/20/2017   Procedure: BILATARAL BREAST RECONSTRUCTION WITH PLACEMENT OF TISSUE EXPANDER AND FLEX HD (ACELLULAR HYDRATED DERMIS);  Surgeon: Wallace Going, DO;  Location: [facility];  Service: Plastics;  Laterality: Bilateral;  . BREAST SURGERY    . CHOLECYSTECTOMY N/A 07/07/2017   Procedure: LAPAROSCOPIC CHOLECYSTECTOMY WITH  INTRAOPERATIVE CHOLANGIOGRAM;  Surgeon: [name], MD;  Location: [facility];  Service: General;  Laterality: N/A;  . MASTECTOMY W/ SENTINEL NODE BIOPSY Bilateral 04/20/2017   Procedure: BILATERAL TOTAL MASTECTOMY WITH LEFT AXILLARY SENTINEL LYMPH NODE BIOPSY;  Surgeon: [name], MD;  Location: [facility];  Service: General;  Laterality: Bilateral;  . PORTA CATH REMOVAL Right 11/04/2017   Procedure: PORTA CATH REMOVAL;  Surgeon: Wallace Going, DO;  Location: [facility];  Service: Plastics;  Laterality: Right;  . PORTACATH PLACEMENT Right 04/25/2015   Procedure: INSERTION PORT-A-CATH;  Surgeon: [name], MD;  Location: [facility];  Service: General;  Laterality: Right;  . PORTACATH PLACEMENT Right 04/20/2017   Procedure: INSERTION PORT-A-CATH;  Surgeon: [name], MD;  Location: [facility];  Service: General;  Laterality: Right;  . RE-EXCISION OF BREAST LUMPECTOMY Left 04/05/2015   Procedure: RE-EXCISION LEFT  BREAST LUMPECTOMY;  Surgeon: [name], MD;  Location: [facility];  Service: General;  Laterality: Left;  . REMOVAL OF TISSUE EXPANDER AND PLACEMENT OF IMPLANT Bilateral 11/04/2017   Procedure: BILATERAL REMOVAL OF TISSUE EXPANDER AND PLACEMENT OF  BILATERAL BREAST IMPLANT;  Surgeon: Wallace Going, DO;  Location: [facility];  Service: Plastics;  Laterality: Bilateral;    FAMILY HISTORY Family History  Problem Relation Age of Onset  . Lung cancer Maternal Grandmother 56       non smoker  . Lung cancer Maternal Grandfather 42       non smoker worked  in coal mines  . Cancer Paternal Grandfather 35       throat cancer ? smoker  . Cancer Cousin 41       throat cancer ? smoker   the patient's parents are living, in their mid-50s as of March 2016. The patient had no siblings. Both the patient's mother's parents died from lung cancer although they did not smoke. [place] did [place] and her maternal grandfather was a Ecologist. There is no history of breast or ovarian cancer in the family to her knowledge  GYNECOLOGIC HISTORY:  Patient's last menstrual period was 04/02/2015. Menarche age 60, first live birth age 36. The patient is GX P3. [name] having regular periods. She uses the Essure device for contraception.  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 Her husband [name] is a Freight forwarder. Their daughter [name] lives in [place] where she works in Press photographer. Daughter Summer is a Research scientist (physical sciences) and lives with the patient.. Daughter [name] also at home is 81 years old.    ADVANCED DIRECTIVES: Not in place   HEALTH MAINTENANCE: Social History   Tobacco Use  . Smoking status: Never Smoker  . Smokeless tobacco: Never Used  Substance Use Topics  . Alcohol use: Yes    Alcohol/week: 0.0 oz    Comment: occ  . Drug use: No     Colonoscopy:  PAP:  Bone density:  Lipid panel:  Allergies  Allergen Reactions  . Adhesive [Tape] Other (See Comments)    Skin blisters    Current Outpatient Medications  Medication Sig Dispense Refill  . anastrozole (ARIMIDEX) 1 MG tablet Take 1 tablet (1 mg total) by mouth daily. 90 tablet 4  . gabapentin (NEURONTIN) 300 MG capsule Take 1 capsule (300 mg total) by mouth at bedtime. 90 capsule 4   No current facility-administered medications for this visit.     OBJECTIVE:  Young white woman who appears stated age  There were no vitals filed for this visit.   There is no height or weight on file to calculate BMI.    ECOG FS:1 - Symptomatic but completely ambulatory   Sclerae unicteric, EOMs intact Oropharynx clear and moist No cervical or supraclavicular adenopathy Lungs no rales or rhonchi Heart regular rate and rhythm Abd soft, nontender, positive bowel sounds MSK no focal spinal tenderness, no upper extremity lymphedema Neuro: nonfocal, well oriented, appropriate affect Breasts: Status post  bilateral mastectomies, with gel implants in place.  On the left there is some erythema, very minimal, and some tightness as compared to the right.  The overall symmetry is good.  Both axillae are benign.   LAB RESULTS:  CMP     Component Value Date/Time   NA 141 12/27/2017 0954   NA 140 10/21/2017 1206   K 4.2 12/27/2017 0954   K 4.0 10/21/2017 1206   CL 104 12/27/2017 0954   CO2 28 12/27/2017 0954   CO2 24 10/21/2017 1206   GLUCOSE 110 12/27/2017 0954   GLUCOSE 98 10/21/2017 1206   BUN 13 12/27/2017 0954   BUN 6.5 (L) 10/21/2017 1206   CREATININE 0.84 12/27/2017 0954   CREATININE 0.9 10/21/2017 1206   CALCIUM 9.6 12/27/2017 0954   CALCIUM 9.8 10/21/2017 1206   PROT 7.3 12/27/2017 0954   PROT 7.5 10/21/2017 1206   ALBUMIN 3.9 12/27/2017 0954   ALBUMIN 3.9 10/21/2017 1206   AST 33 12/27/2017 0954   AST 32 10/21/2017 1206  ALT 45 12/27/2017 0954   ALT 37 10/21/2017 1206   ALKPHOS 89 12/27/2017 0954   ALKPHOS 105 10/21/2017 1206   BILITOT 0.8 12/27/2017 0954   BILITOT 0.64 10/21/2017 1206   GFRNONAA >60 12/27/2017 0954   GFRAA >60 12/27/2017 0954    INo results found for: SPEP, UPEP  Lab Results  Component Value Date   WBC 6.3 12/27/2017   NEUTROABS 3.9 12/27/2017   HGB 12.4 12/27/2017   HCT 38.4 12/27/2017   MCV 91.2 12/27/2017   PLT 318 12/27/2017      Chemistry      Component Value Date/Time   NA 141 12/27/2017 0954   NA 140 10/21/2017 1206   K 4.2 12/27/2017 0954   K 4.0 10/21/2017 1206   CL 104 12/27/2017 0954   CO2 28 12/27/2017 0954   CO2 24 10/21/2017 1206   BUN 13 12/27/2017 0954   BUN 6.5 (L) 10/21/2017 1206   CREATININE 0.84 12/27/2017 0954   CREATININE 0.9 10/21/2017 1206      Component Value Date/Time   CALCIUM 9.6 12/27/2017 0954   CALCIUM 9.8 10/21/2017 1206   ALKPHOS 89 12/27/2017 0954   ALKPHOS 105 10/21/2017 1206   AST 33 12/27/2017 0954   AST 32 10/21/2017 1206   ALT 45 12/27/2017 0954   ALT 37 10/21/2017 1206   BILITOT 0.8  12/27/2017 0954   BILITOT 0.64 10/21/2017 1206       No results found for: LABCA2  No components found for: LABCA125  No results for input(s): INR in the last 168 hours.  Urinalysis    Component Value Date/Time   COLORURINE YELLOW (A) 07/24/2017 2158   APPEARANCEUR CLEAR (A) 07/24/2017 2158   LABSPEC 1.009 07/24/2017 2158   LABSPEC 1.020 05/30/2015 1502   PHURINE 7.0 07/24/2017 2158   GLUCOSEU NEGATIVE 07/24/2017 2158   GLUCOSEU Negative 05/30/2015 1502   HGBUR SMALL (A) 07/24/2017 2158   BILIRUBINUR NEGATIVE 07/24/2017 2158   BILIRUBINUR Negative 05/30/2015 [place] 07/24/2017 2158   PROTEINUR NEGATIVE 07/24/2017 2158   UROBILINOGEN 0.2 05/30/2015 1502   NITRITE NEGATIVE 07/24/2017 2158   LEUKOCYTESUR NEGATIVE 07/24/2017 2158   LEUKOCYTESUR Negative 05/30/2015 1502    STUDIES: No results found.  ASSESSMENT: 42 y.o. [place], [place] woman status post left breast upper outer quadrant biopsy 02/08/2015 for ductal carcinoma in situ, grade 2 or 3, strongly estrogen and progesterone receptor positive, with likely areas of microinvasion  (a) biopsy of an area in the left breast upper outer quadrant showed PASH  (b) biopsy of 2 additional questionable areas, one in each breast, showed bilateral fibroadenomas  (1) genetics testing March 2016 through the BreastNext gene panel offered by [employer] showed no deleterious mutations in ATM, BARD1, BRCA1, BRCA2, BRIP1, CDH1, CHEK2, MRE11A, MUTYH, NBN, NF1, PALB2, PTEN, RAD50, RAD51C, RAD51D, and TP53.  (2) status post left lumpectomy with sentinel lymph node sampling 03/27/2015 for a pT1c pN0, stage IA invasive ductal carcinoma, grade 3, triple negative, with an MIB-1 of 33%  (a) close margins were cleared with subsequent excision 04/05/2015.  (3) Oncotype DX score of 38 predicts a risk of 26% outside the breast recurrence within 10 years if the patient's only systemic treatment is tamoxifen for 5 years  (4) adjuvant  chemotherapy started 05/02/2015 consisting of doxorubicin and cyclophosphamide in dose dense fashion 4, completed 06/13/2015, followed by paclitaxel weekly 12.   (a) Paclitaxel stopped after only 2 cycles because of persistent neuropathy.  (5) adjuvant radiation completed  11/08  (5) tamoxifen started 02/20/2015 (neoadjuvantly), discontinued 04/19/2015 so as not to overlap chemotherapy; resumed 02/07/2016, discontinued April 2018 with disease recurrence  RECURRENT DISEASE: April 2018 (6) left breast lower outer quadrant biopsy 03/05/2017 shows invasive ductal carcinoma, grade 2, triple negative  (7) status post bilateral mastectomies and left axillary lymph node dissection 04/20/2017 showing a residual left pT2 pN0 (8 nodes removed) invasive ductal carcinoma, grade 3, with negative margins; the right breast was benign  (a) she underwent expander exchange for bilateral gel implants with capsulectomies 11/04/2017  (8) chemotherapy for her recurrence consisting of cyclophosphamide, methotrexate and fluorouracil (CMF) given every 21 days 8, started 05/13/2017  (a) eighth cycle omitted because of intercurrent plastic surgery and complications  (9) status post cholecystectomy 07/07/2017, with benign pathology  PLAN  E[place] has completed her active treatment.  Her left breast may need a little bit of work in the future, but overall her plastic reconstruction symmetry is very good.  She has a moderate exercise program which is much better than before.  I commended her and encouraged her to extend it.  We discussed first of all antiestrogens.  Because her cancer grew through tamoxifen, it would be prudent to switch to an aromatase inhibitor.  We discussed the possible toxicity side effects and complications of anastrozole and I went ahead and placed a prescription in for her.  She has not had a bone density so I set that up at the breast center to be done before her return visit here.  We do  need to obtain restaging studies and her insurance would not allow [place] to proceed to pet at this point.  We are getting a chest CT next week.  If there are any areas that require further evaluation we will move towards a PET again.  As far as her hot flashes are concerned she tells me she got no benefit from venlafaxine in the past.  I wrote for gabapentin for her to take at bedtime.  She will see me again in approximately 3 months.  She knows to call for any other issues that may develop before that visit.   Terrace Chiem, Virgie Dad, MD  01/01/18 10:08 AM Medical Oncology and Hematology [facility] [address] [place], [place] [address] Tel. [phone]    Fax. [phone]  This document serves as a record of services personally performed by Lurline Del, MD. It was created on his behalf by Sheron Nightingale, a trained medical scribe. The creation of this record is based on the scribe's personal observations and the provider's statements to them.   I have reviewed the above documentation for accuracy and completeness, and I agree with the above.

## 2018-01-07 ENCOUNTER — Ambulatory Visit (HOSPITAL_COMMUNITY)
Admission: RE | Admit: 2018-01-07 | Discharge: 2018-01-07 | Disposition: A | Discharge status: Home / Self Care | Payer: 59 | Source: Ambulatory Visit | Attending: Oncology | Admitting: Oncology

## 2018-01-07 ENCOUNTER — Encounter (HOSPITAL_COMMUNITY): Payer: Self-pay

## 2018-01-07 DIAGNOSIS — K760 Fatty (change of) liver, not elsewhere classified: Secondary | ICD-10-CM | POA: Diagnosis not present

## 2018-01-07 DIAGNOSIS — G620 Drug-induced polyneuropathy: Secondary | ICD-10-CM | POA: Diagnosis not present

## 2018-01-07 DIAGNOSIS — I700 Atherosclerosis of aorta: Secondary | ICD-10-CM | POA: Diagnosis not present

## 2018-01-07 DIAGNOSIS — Z171 Estrogen receptor negative status [ER-]: Secondary | ICD-10-CM | POA: Diagnosis not present

## 2018-01-07 DIAGNOSIS — C50412 Malignant neoplasm of upper-outer quadrant of left female breast: Secondary | ICD-10-CM

## 2018-01-07 DIAGNOSIS — R911 Solitary pulmonary nodule: Secondary | ICD-10-CM | POA: Diagnosis not present

## 2018-01-07 DIAGNOSIS — C50912 Malignant neoplasm of unspecified site of left female breast: Secondary | ICD-10-CM

## 2018-01-07 DIAGNOSIS — T451X5A Adverse effect of antineoplastic and immunosuppressive drugs, initial encounter: Secondary | ICD-10-CM

## 2018-01-07 MED ORDER — IOPAMIDOL (ISOVUE-300) INJECTION 61%
75.0000 mL | Freq: Once | INTRAVENOUS | Status: AC | PRN
  Administered 2018-01-07: 75 mL via INTRAVENOUS

## 2018-01-07 MED ORDER — IOPAMIDOL (ISOVUE-300) INJECTION 61%
INTRAVENOUS | Status: AC
  Filled 2018-01-07: qty 75

## 2018-02-23 DIAGNOSIS — Z01419 Encounter for gynecological examination (general) (routine) without abnormal findings: Secondary | ICD-10-CM | POA: Diagnosis not present

## 2018-02-23 DIAGNOSIS — Z124 Encounter for screening for malignant neoplasm of cervix: Secondary | ICD-10-CM | POA: Diagnosis not present

## 2018-02-24 ENCOUNTER — Encounter: Payer: Self-pay | Admitting: Oncology

## 2018-03-03 ENCOUNTER — Ambulatory Visit (INDEPENDENT_AMBULATORY_CARE_PROVIDER_SITE_OTHER): Payer: 59 | Admitting: Family Medicine

## 2018-03-03 ENCOUNTER — Encounter: Payer: Self-pay | Admitting: Family Medicine

## 2018-03-03 VITALS — BP 130/90 | HR 97 | Ht 62.0 in | Wt 204.7 lb

## 2018-03-03 DIAGNOSIS — G629 Polyneuropathy, unspecified: Secondary | ICD-10-CM | POA: Diagnosis not present

## 2018-03-03 DIAGNOSIS — T451X5A Adverse effect of antineoplastic and immunosuppressive drugs, initial encounter: Secondary | ICD-10-CM | POA: Diagnosis not present

## 2018-03-03 DIAGNOSIS — Z923 Personal history of irradiation: Secondary | ICD-10-CM | POA: Diagnosis not present

## 2018-03-03 DIAGNOSIS — G620 Drug-induced polyneuropathy: Secondary | ICD-10-CM | POA: Diagnosis not present

## 2018-03-03 DIAGNOSIS — C50412 Malignant neoplasm of upper-outer quadrant of left female breast: Secondary | ICD-10-CM | POA: Diagnosis not present

## 2018-03-03 DIAGNOSIS — Z171 Estrogen receptor negative status [ER-]: Secondary | ICD-10-CM | POA: Diagnosis not present

## 2018-03-03 DIAGNOSIS — C50912 Malignant neoplasm of unspecified site of left female breast: Secondary | ICD-10-CM | POA: Diagnosis not present

## 2018-03-03 NOTE — Patient Instructions (Signed)

## 2018-03-03 NOTE — Progress Notes (Signed)
New patient office visit note:  Impression and Recommendations:    1. Recurrent breast cancer, left ([place])   2. Malignant neoplasm of upper-outer quadrant of left breast in female, estrogen receptor negative ([place])   3. Chemotherapy-induced neuropathy ([place])   4. History of therapeutic radiation-30+ treatments   5. Neuropathy     1. History of Recurrent Breast Cancer - L Breast - Given her history, emphasized that the patient should continue following up with her specialists for screenings, tests, and specialty advice.  - Given her history of cancer, emphasized focus on high antioxidant diet and getting trim and fit to improve immune system.  2. General Health Maintenance Explained to patient what BMI refers to, and what it means medically.    Told patient to think about it as a "medical risk stratification measurement" and how increasing BMI is associated with increasing risk/ or worsening state of various diseases such as hypertension, hyperlipidemia, diabetes, premature OA, depression etc.  American Heart Association guidelines for healthy diet, basically Mediterranean diet, and exercise guidelines of 30 minutes 5 days per week or more discussed in detail.  Health counseling performed.  All questions answered.  - Advised patient to continue working toward exercising to improve health.  Encouraged walking with her daughter and exercising together, as exercise is the best "medicine" for mental well-being and overall health and well-being.  - Patient may begin with 15 minutes of activity daily.  Recommended that the patient eventually strive for at least 150 minutes of cardio per week according to the [facility].   - Healthy dietary habits encouraged, including low-carb, and high amounts of lean protein in diet.   - Patient should also consume adequate amounts of water - half of body weight in oz of water per day   Education and routine counseling performed. Handouts  provided.  Follow-Up - Fasting blood work will be drawn in the near future to establish the patient's baseline. - Reviewed that patient will have the blood work drawn 2-3 days prior to next OV. - Will discuss lab results at next follow-up OV.   Orders Placed This Encounter  Procedures  . CBC with Differential/Platelet  . Comprehensive metabolic panel  . Hemoglobin A1c  . TSH  . VITAMIN D 25 Hydroxy (Vit-D Deficiency, Fractures)  . Lipid panel  . Vitamin B12  . T4, free    No orders of the defined types were placed in this encounter.   Gross side effects, risk and benefits, and alternatives of medications discussed with patient.  Patient is aware that all medications have potential side effects and we are unable to predict every side effect or drug-drug interaction that may occur.  Expresses verbal understanding and consents to current therapy plan and treatment regimen.  Return for Chronic OV w me near future & FBW 2-3d prior.  Please see AVS handed out to patient at the end of our visit for further patient instructions/ counseling done pertaining to today's office visit.    Note: This document was prepared using Dragon voice recognition software and may include unintentional dictation errors.     This document serves as a record of services personally performed by Mellody Dance, DO. It was created on her behalf by Toni Amend, a trained medical scribe. The creation of this record is based on the scribe's personal observations and the provider's statements to them.   I have reviewed the above medical documentation for accuracy and completeness and I concur.  Cheryl [name]  Cheryl [name] 03/13/18 7:36 PM   ----------------------------------------------------------------------------------------------------------------------    Subjective:    Chief complaint:   Chief Complaint  Patient presents with  . Establish Care    HPI: Cheryl [name] is a pleasant 42 y.o.  female who presents to [place] [facility] today to review their medical history with me and establish care.   I asked the patient to review their chronic problem list with me to ensure everything was updated and accurate.    All recent office visits with other providers, any medical records that patient brought in etc  - I reviewed today.     We asked pt to get [place] their medical records from [facility] providers/ specialists that they had seen within the past 3-5 years- if they are in private practice and/or do not work for [employer], [facility], [place], [place] or [employer] owned practice.  Told them to call their specialists to clarify this if they are not sure.   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  Lives with 60 year old daughter and fiance. Been with fiance [name] for almost 2 years. Sexually active with just fiance.  Children: All daughters; 42 year old daughter and 81 year old daughter (Cheryl [name]). Cheryl [name] attends the appointment today with her mother.  Tobacco use Never smoker.  EtOH Use 6 drinks per week.  Usually unwinds on Saturday nights.  Not really exercising.  Family History No siblings.  Maternal Grandmother had lung cancer in her 59's. Per mother, maternal grandmother smoked.  No other female cancers, breast or ovarian.  Mother was in her 67's when she got diabetes. She was pretty unhealthy at that time.  Mother and paternal grandmother with HTN. Mother was in her 53's when she got HTN.  Dad is healthy - exercises 3 hours per day.  Surgical History  Lumpectomy, gallbladder, mastectomy.  Past Medical History  Has never had a PCP.  Left-Sided Breast Cancer First diagnosed March or April of 2016, through [place] - Dr. Marvel Plan with [facility].  In 2016, she turned 39 and went to have a mammogram. Two different kinds of cancer were found at that time. One was ductal  and non-invasive; the other was outside the duct, and ER+.  Went through chemo (not sure how many rounds), radiation (30+ rounds), and lumpectomy Notes that she was there every day for a month for radiation treatment.  Was in remission from her cancer during 2017.  Had another mammogram in 2018. Was diagnosed with breast cancer again in the same breast - cancer had returned, ER+. Had mastectomy at that time and is still doing some reconstructive surgery.    Was told by her OBGYN last week that she may want to have her ovaries taken out, given her cancer history. Isn't sure how to proceed with this information.  Providers Dr. Marla Roe is her plastic surgeon. Dr. Jana Hakim is her oncologist. Dr. [name] for general surgery.  Fatty Liver Has an issue with her liver - had scans and was told she has fatty liver.  Was told that her specialists think that her liver will heal itself this year.  Cardiac During her cancer treatment, had a scan done that showed some hardening of her arteries.  Denies SOB, dizziness, or other cardiac symptoms.    Wt Readings from Last 3 Encounters:  03/03/18 204 lb 11.2 oz (92.9 kg)  12/27/17 214 lb 4.8 oz (  97.2 kg)  11/04/17 215 lb (97.5 kg)   BP Readings from Last 3 Encounters:  03/03/18 130/90  12/27/17 (!) 125/94  11/04/17 129/83   Pulse Readings from Last 3 Encounters:  03/03/18 97  12/27/17 (!) 104  11/04/17 (!) 108   BMI Readings from Last 3 Encounters:  03/03/18 37.44 kg/m  12/27/17 40.49 kg/m  11/04/17 40.62 kg/m    Patient Care Team    Relationship Specialty Notifications Start End  Mellody Dance, DO PCP - General Family Medicine  03/03/18   [name], MD Consulting Physician Obstetrics and Gynecology  02/13/15   [name], MD Consulting Physician General Surgery  02/13/15   Magrinat, Virgie Dad, MD Consulting Physician Oncology  02/13/15   [name], RN Registered Nurse   02/13/15   [name],  RN Registered Nurse   02/13/15   Dillingham, [name], DO Attending Physician Plastic Surgery  03/21/17     Patient Active Problem List   Diagnosis Date Noted  . History of therapeutic radiation- 30+ txmnts 03/03/2018  . Port-A-Cath in place 09/09/2017  . Focal nodular hyperplasia of liver by MRI  07/26/2017  . Acquired absence of bilateral breasts and nipples 04/27/2017  . Recurrent breast cancer, left ([place]) 03/18/2017  . Genetic testing 03/05/2016  . Chemotherapy-induced neuropathy ([place]) 07/11/2015  . Bronchitis 06/13/2015  . Flank pain 05/30/2015  . UTI (urinary tract infection) 05/16/2015  . Malignant neoplasm of upper-outer quadrant of left breast in female, estrogen receptor negative ([place]) 02/13/2015     Past Medical History:  Diagnosis Date  . Breast cancer ([place])    left breast  . Wears contact lenses      Past Medical History:  Diagnosis Date  . Breast cancer ([place])    left breast  . Wears contact lenses      Past Surgical History:  Procedure Laterality Date  . BILATERAL TOTAL MASTECTOMY WITH AXILLARY LYMPH NODE DISSECTION  04/20/2017  . BREAST BIOPSY Left 02/18/2015  . BREAST BIOPSY Right 02/18/2015  . BREAST EXCISIONAL BIOPSY Left 04/05/2015  . BREAST LUMPECTOMY Left 03/27/2015  . BREAST LUMPECTOMY WITH NEEDLE LOCALIZATION AND AXILLARY SENTINEL LYMPH NODE BX Left 03/27/2015   Procedure: LEFT BREAST LUMPECTOMY WITH BRACKETED NEEDLE LOCALIZATION AND LEFT  AXILLARY SENTINEL LYMPH NODE BX;  Surgeon: [name], MD;  Location: [place];  Service: General;  Laterality: Left;  . BREAST RECONSTRUCTION WITH PLACEMENT OF TISSUE EXPANDER AND FLEX HD (ACELLULAR HYDRATED DERMIS) Bilateral 04/20/2017   Procedure: BILATARAL BREAST RECONSTRUCTION WITH PLACEMENT OF TISSUE EXPANDER AND FLEX HD (ACELLULAR HYDRATED DERMIS);  Surgeon: Wallace Going, DO;  Location: [place];  Service: Plastics;  Laterality: Bilateral;  . BREAST SURGERY    . CHOLECYSTECTOMY  N/A 07/07/2017   Procedure: LAPAROSCOPIC CHOLECYSTECTOMY WITH INTRAOPERATIVE CHOLANGIOGRAM;  Surgeon: [name], MD;  Location: [facility];  Service: General;  Laterality: N/A;  . MASTECTOMY W/ SENTINEL NODE BIOPSY Bilateral 04/20/2017   Procedure: BILATERAL TOTAL MASTECTOMY WITH LEFT AXILLARY SENTINEL LYMPH NODE BIOPSY;  Surgeon: [name], MD;  Location: [place];  Service: General;  Laterality: Bilateral;  . PORTA CATH REMOVAL Right 11/04/2017   Procedure: PORTA CATH REMOVAL;  Surgeon: Wallace Going, DO;  Location: [place];  Service: Plastics;  Laterality: Right;  . PORTACATH PLACEMENT Right 04/25/2015   Procedure: INSERTION PORT-A-CATH;  Surgeon: [name], MD;  Location: [place];  Service: General;  Laterality: Right;  . PORTACATH PLACEMENT Right 04/20/2017   Procedure:  INSERTION PORT-A-CATH;  Surgeon: [name], MD;  Location: [place];  Service: General;  Laterality: Right;  . RE-EXCISION OF BREAST LUMPECTOMY Left 04/05/2015   Procedure: RE-EXCISION LEFT  BREAST LUMPECTOMY;  Surgeon: [name], MD;  Location: [place];  Service: General;  Laterality: Left;  . REMOVAL OF TISSUE EXPANDER AND PLACEMENT OF IMPLANT Bilateral 11/04/2017   Procedure: BILATERAL REMOVAL OF TISSUE EXPANDER AND PLACEMENT OF  BILATERAL BREAST IMPLANT;  Surgeon: Wallace Going, DO;  Location: [place];  Service: Plastics;  Laterality: Bilateral;     Family History  Problem Relation Age of Onset  . Lung cancer Maternal Grandmother 85       non smoker  . Lung cancer Maternal Grandfather 34       non smoker worked in [employer]  . Cancer Paternal Grandfather 35       throat cancer ? smoker  . Cancer Cousin 41       throat cancer ? smoker     Social History   Substance and Sexual Activity  Drug Use No     Social History   Substance and Sexual Activity  Alcohol Use Yes  . Alcohol/week: 0.0 oz    Comment: occ     Social History   Tobacco Use  Smoking Status Never Smoker  Smokeless Tobacco Never Used     Current Meds  Medication Sig  . anastrozole (ARIMIDEX) 1 MG tablet Take 1 tablet (1 mg total) by mouth daily.  Marland Kitchen gabapentin (NEURONTIN) 300 MG capsule Take 1 capsule (300 mg total) by mouth at bedtime.    Allergies: Adhesive [tape]   Review of Systems  Constitutional: Negative for chills, diaphoresis, fever, malaise/fatigue and weight loss.  HENT: Negative for congestion, sore throat and tinnitus.   Eyes: Negative for blurred vision, double vision and photophobia.  Respiratory: Negative for cough and wheezing.   Cardiovascular: Negative for chest pain and palpitations.  Gastrointestinal: Negative for blood in stool, diarrhea, nausea and vomiting.  Genitourinary: Negative for dysuria, frequency and urgency.  Musculoskeletal: Negative for joint pain and myalgias.  Skin: Negative for itching and rash.  Neurological: Negative for dizziness, focal weakness, weakness and headaches.  Endo/Heme/Allergies: Negative for environmental allergies and polydipsia. Does not bruise/bleed easily.  Psychiatric/Behavioral: Negative for depression and memory loss. The patient is not nervous/anxious and does not have insomnia.      Objective:   Blood pressure 130/90, pulse 97, height '5\' 2"'  (1.575 m), weight 204 lb 11.2 oz (92.9 kg), last menstrual period 04/02/2015, SpO2 99 %. Body mass index is 37.44 kg/m. General: Well Developed, well nourished, and in no acute distress.  Neuro: Alert and oriented x3, extra-ocular muscles intact, sensation grossly intact.  HEENT:[place]/AT, PERRLA, neck supple, No carotid bruits Skin: no gross rashes  Cardiac: Regular rate and rhythm Respiratory: Essentially clear to auscultation bilaterally. Not using accessory muscles, speaking in full sentences.  Abdominal: not grossly distended Musculoskeletal: Ambulates w/o diff, FROM * 4 ext.  Vasc: less 2 sec  cap RF, warm and pink  Psych:  No HI/SI, judgement and insight good, Euthymic mood. Full Affect.    Recent Results (from the past 2160 hour(s))  Comprehensive metabolic panel     Status: None   Collection Time: 12/27/17  9:54 AM  Result Value Ref Range   Sodium 141 136 - 145 mmol/L   Potassium 4.2 3.5 - 5.1 mmol/L   Chloride 104 98 - 109 mmol/L   CO2 28 22 -  29 mmol/L   Glucose, Bld 110 70 - 140 mg/dL   BUN 13 7 - 26 mg/dL   Creatinine, Ser 0.84 0.60 - 1.10 mg/dL   Calcium 9.6 8.4 - 10.4 mg/dL   Total Protein 7.3 6.4 - 8.3 g/dL   Albumin 3.9 3.5 - 5.0 g/dL   AST 33 5 - 34 U/L   ALT 45 0 - 55 U/L   Alkaline Phosphatase 89 40 - 150 U/L   Total Bilirubin 0.8 0.2 - 1.2 mg/dL   GFR calc non Af Amer >60 >60 mL/min   GFR calc Af Amer >60 >60 mL/min    Comment: (NOTE) The eGFR has been calculated using the CKD EPI equation. This calculation has not been validated in all clinical situations. eGFR's persistently <60 mL/min signify possible Chronic Kidney Disease.    Anion gap 9 3 - 11    Comment: Performed at [facility], [address], [place], [place] [address]  CBC with Differential     Status: None   Collection Time: 12/27/17  9:54 AM  Result Value Ref Range   WBC 6.3 3.9 - 10.3 K/uL   RBC 4.21 3.70 - 5.45 MIL/uL   Hemoglobin 12.4 11.6 - 15.9 g/dL   HCT 38.4 34.8 - 46.6 %   MCV 91.2 79.5 - 101.0 fL   MCH 29.5 25.1 - 34.0 pg   MCHC 32.3 31.5 - 36.0 g/dL   RDW 13.9 11.2 - 14.5 %   Platelets 318 145 - 400 K/uL   Neutrophils Relative % 63 %   Neutro Abs 3.9 1.5 - 6.5 K/uL   Lymphocytes Relative 26 %   Lymphs Abs 1.7 0.9 - 3.3 K/uL   Monocytes Relative 8 %   Monocytes Absolute 0.5 0.1 - 0.9 K/uL   Eosinophils Relative 3 %   Eosinophils Absolute 0.2 0.0 - 0.5 K/uL   Basophils Relative 0 %   Basophils Absolute 0.0 0.0 - 0.1 K/uL    Comment: Performed at [facility], [place] [address] [place], [place] [address]

## 2018-03-10 ENCOUNTER — Telehealth: Payer: Self-pay | Admitting: Oncology

## 2018-03-10 NOTE — Telephone Encounter (Signed)
Called pt re her appt being rescheduled due to [facility] PAL - left voicemail for patient re appts.

## 2018-03-13 DIAGNOSIS — G629 Polyneuropathy, unspecified: Secondary | ICD-10-CM | POA: Insufficient documentation

## 2018-03-21 ENCOUNTER — Other Ambulatory Visit: Payer: 59

## 2018-03-21 ENCOUNTER — Ambulatory Visit
Admission: RE | Admit: 2018-03-21 | Discharge: 2018-03-21 | Disposition: A | Discharge status: Home / Self Care | Payer: 59 | Source: Ambulatory Visit | Attending: Oncology | Admitting: Oncology

## 2018-03-21 DIAGNOSIS — C50412 Malignant neoplasm of upper-outer quadrant of left female breast: Secondary | ICD-10-CM

## 2018-03-21 DIAGNOSIS — Z780 Asymptomatic menopausal state: Secondary | ICD-10-CM | POA: Diagnosis not present

## 2018-03-21 DIAGNOSIS — M8588 Other specified disorders of bone density and structure, other site: Secondary | ICD-10-CM | POA: Diagnosis not present

## 2018-03-21 DIAGNOSIS — Z171 Estrogen receptor negative status [ER-]: Principal | ICD-10-CM

## 2018-03-21 DIAGNOSIS — G620 Drug-induced polyneuropathy: Secondary | ICD-10-CM

## 2018-03-21 DIAGNOSIS — T451X5A Adverse effect of antineoplastic and immunosuppressive drugs, initial encounter: Secondary | ICD-10-CM

## 2018-03-21 DIAGNOSIS — C50912 Malignant neoplasm of unspecified site of left female breast: Secondary | ICD-10-CM

## 2018-03-24 ENCOUNTER — Inpatient Hospital Stay: Payer: 59 | Attending: Oncology

## 2018-03-24 DIAGNOSIS — C50512 Malignant neoplasm of lower-outer quadrant of left female breast: Secondary | ICD-10-CM | POA: Insufficient documentation

## 2018-03-24 DIAGNOSIS — K760 Fatty (change of) liver, not elsewhere classified: Secondary | ICD-10-CM | POA: Insufficient documentation

## 2018-03-24 DIAGNOSIS — C50412 Malignant neoplasm of upper-outer quadrant of left female breast: Secondary | ICD-10-CM

## 2018-03-24 DIAGNOSIS — R7303 Prediabetes: Secondary | ICD-10-CM | POA: Insufficient documentation

## 2018-03-24 DIAGNOSIS — Z171 Estrogen receptor negative status [ER-]: Secondary | ICD-10-CM | POA: Insufficient documentation

## 2018-03-24 DIAGNOSIS — Z170 Estrogen receptor positive status [ER+]: Secondary | ICD-10-CM | POA: Insufficient documentation

## 2018-03-24 LAB — COMPREHENSIVE METABOLIC PANEL
ALBUMIN: 4.2 g/dL (ref 3.5–5.0)
ALT: 27 U/L (ref 0–55)
AST: 18 U/L (ref 5–34)
Alkaline Phosphatase: 94 U/L (ref 40–150)
Anion gap: 10 (ref 3–11)
BILIRUBIN TOTAL: 0.7 mg/dL (ref 0.2–1.2)
BUN: 14 mg/dL (ref 7–26)
CALCIUM: 9.9 mg/dL (ref 8.4–10.4)
CO2: 27 mmol/L (ref 22–29)
Chloride: 103 mmol/L (ref 98–109)
Creatinine, Ser: 0.88 mg/dL (ref 0.60–1.10)
GFR calc Af Amer: >60 mL/min (ref >60)
GLUCOSE: 117 mg/dL (ref 70–140)
POTASSIUM: 4.3 mmol/L (ref 3.5–5.1)
Sodium: 140 mmol/L (ref 136–145)
TOTAL PROTEIN: 7.7 g/dL (ref 6.4–8.3)

## 2018-03-24 LAB — CBC WITH DIFFERENTIAL/PLATELET
BASOS ABS: 0 10*3/uL (ref 0.0–0.1)
Basophils Relative: 0 %
EOS ABS: 0.1 10*3/uL (ref 0.0–0.5)
EOS PCT: 2 %
HCT: 36.8 % (ref 34.8–46.6)
Hemoglobin: 12.2 g/dL (ref 11.6–15.9)
LYMPHS PCT: 30 %
Lymphs Abs: 2.1 10*3/uL (ref 0.9–3.3)
MCH: 29.4 pg (ref 25.1–34.0)
MCHC: 33.2 g/dL (ref 31.5–36.0)
MCV: 88.7 fL (ref 79.5–101.0)
MONO ABS: 0.4 10*3/uL (ref 0.1–0.9)
Monocytes Relative: 6 %
Neutro Abs: 4.5 10*3/uL (ref 1.5–6.5)
Neutrophils Relative %: 62 %
Platelets: 319 10*3/uL (ref 145–400)
RBC: 4.15 MIL/uL (ref 3.70–5.45)
RDW: 13.9 % (ref 11.2–14.5)
WBC: 7.2 10*3/uL (ref 3.9–10.3)

## 2018-03-28 ENCOUNTER — Ambulatory Visit: Payer: 59 | Admitting: Oncology

## 2018-03-31 ENCOUNTER — Ambulatory Visit: Payer: 59 | Admitting: Oncology

## 2018-04-01 ENCOUNTER — Other Ambulatory Visit: Payer: 59

## 2018-04-01 DIAGNOSIS — C50412 Malignant neoplasm of upper-outer quadrant of left female breast: Secondary | ICD-10-CM

## 2018-04-01 DIAGNOSIS — Z923 Personal history of irradiation: Secondary | ICD-10-CM

## 2018-04-01 DIAGNOSIS — C50912 Malignant neoplasm of unspecified site of left female breast: Secondary | ICD-10-CM | POA: Diagnosis not present

## 2018-04-01 DIAGNOSIS — Z171 Estrogen receptor negative status [ER-]: Secondary | ICD-10-CM | POA: Diagnosis not present

## 2018-04-01 DIAGNOSIS — G629 Polyneuropathy, unspecified: Secondary | ICD-10-CM

## 2018-04-01 DIAGNOSIS — T451X5A Adverse effect of antineoplastic and immunosuppressive drugs, initial encounter: Secondary | ICD-10-CM

## 2018-04-01 DIAGNOSIS — G620 Drug-induced polyneuropathy: Secondary | ICD-10-CM

## 2018-04-02 LAB — COMPREHENSIVE METABOLIC PANEL
A/G RATIO: 1.7 (ref 1.2–2.2)
ALBUMIN: 4.3 g/dL (ref 3.5–5.5)
ALT: 25 IU/L (ref 0–32)
AST: 19 IU/L (ref 0–40)
Alkaline Phosphatase: 89 IU/L (ref 39–117)
BUN / CREAT RATIO: 14 (ref 9–23)
BUN: 11 mg/dL (ref 6–24)
Bilirubin Total: 0.8 mg/dL (ref 0.0–1.2)
CO2: 25 mmol/L (ref 20–29)
Calcium: 9.8 mg/dL (ref 8.7–10.2)
Chloride: 102 mmol/L (ref 96–106)
Creatinine, Ser: 0.76 mg/dL (ref 0.57–1.00)
GFR, EST AFRICAN AMERICAN: 111 mL/min/{1.73_m2} (ref >59)
GFR, EST NON AFRICAN AMERICAN: 96 mL/min/{1.73_m2} (ref >59)
Globulin, Total: 2.6 g/dL (ref 1.5–4.5)
Glucose: 108 mg/dL — ABNORMAL HIGH (ref 65–99)
POTASSIUM: 4.6 mmol/L (ref 3.5–5.2)
Sodium: 142 mmol/L (ref 134–144)
TOTAL PROTEIN: 6.9 g/dL (ref 6.0–8.5)

## 2018-04-02 LAB — CBC WITH DIFFERENTIAL/PLATELET
BASOS ABS: 0 10*3/uL (ref 0.0–0.2)
BASOS: 1 %
EOS (ABSOLUTE): 0.2 10*3/uL (ref 0.0–0.4)
Eos: 3 %
HEMOGLOBIN: 12.2 g/dL (ref 11.1–15.9)
Hematocrit: 36.4 % (ref 34.0–46.6)
Immature Grans (Abs): 0 10*3/uL (ref 0.0–0.1)
Immature Granulocytes: 0 %
LYMPHS ABS: 1.7 10*3/uL (ref 0.7–3.1)
Lymphs: 30 %
MCH: 29.2 pg (ref 26.6–33.0)
MCHC: 33.5 g/dL (ref 31.5–35.7)
MCV: 87 fL (ref 79–97)
MONOCYTES: 6 %
Monocytes Absolute: 0.4 10*3/uL (ref 0.1–0.9)
NEUTROS ABS: 3.5 10*3/uL (ref 1.4–7.0)
Neutrophils: 60 %
Platelets: 348 10*3/uL (ref 150–379)
RBC: 4.18 x10E6/uL (ref 3.77–5.28)
RDW: 14.6 % (ref 12.3–15.4)
WBC: 5.7 10*3/uL (ref 3.4–10.8)

## 2018-04-02 LAB — LIPID PANEL
CHOLESTEROL TOTAL: 265 mg/dL — AB (ref 100–199)
Chol/HDL Ratio: 5.1 ratio — ABNORMAL HIGH (ref 0.0–4.4)
HDL: 52 mg/dL (ref >39)
LDL CALC: 177 mg/dL — AB (ref 0–99)
TRIGLYCERIDES: 182 mg/dL — AB (ref 0–149)
VLDL CHOLESTEROL CAL: 36 mg/dL (ref 5–40)

## 2018-04-02 LAB — T4, FREE: Free T4: 1.06 ng/dL (ref 0.82–1.77)

## 2018-04-02 LAB — HEMOGLOBIN A1C
Est. average glucose Bld gHb Est-mCnc: 128 mg/dL
HEMOGLOBIN A1C: 6.1 % — AB (ref 4.8–5.6)

## 2018-04-02 LAB — TSH: TSH: 2.17 u[IU]/mL (ref 0.450–4.500)

## 2018-04-02 LAB — VITAMIN B12: Vitamin B-12: 261 pg/mL (ref 232–1245)

## 2018-04-02 LAB — VITAMIN D 25 HYDROXY (VIT D DEFICIENCY, FRACTURES): Vit D, 25-Hydroxy: 24.2 ng/mL — ABNORMAL LOW (ref 30.0–100.0)

## 2018-04-04 ENCOUNTER — Encounter: Payer: Self-pay | Admitting: Family Medicine

## 2018-04-04 ENCOUNTER — Ambulatory Visit (INDEPENDENT_AMBULATORY_CARE_PROVIDER_SITE_OTHER): Payer: 59 | Admitting: Family Medicine

## 2018-04-04 ENCOUNTER — Telehealth: Payer: Self-pay | Admitting: Family Medicine

## 2018-04-04 VITALS — BP 130/84 | HR 74 | Ht 62.0 in | Wt 212.4 lb

## 2018-04-04 DIAGNOSIS — R7303 Prediabetes: Secondary | ICD-10-CM | POA: Diagnosis not present

## 2018-04-04 DIAGNOSIS — E559 Vitamin D deficiency, unspecified: Secondary | ICD-10-CM | POA: Diagnosis not present

## 2018-04-04 DIAGNOSIS — E781 Pure hyperglyceridemia: Secondary | ICD-10-CM | POA: Diagnosis not present

## 2018-04-04 DIAGNOSIS — Z719 Counseling, unspecified: Secondary | ICD-10-CM | POA: Diagnosis not present

## 2018-04-04 DIAGNOSIS — Z833 Family history of diabetes mellitus: Secondary | ICD-10-CM | POA: Diagnosis not present

## 2018-04-04 DIAGNOSIS — E7800 Pure hypercholesterolemia, unspecified: Secondary | ICD-10-CM

## 2018-04-04 DIAGNOSIS — R7302 Impaired glucose tolerance (oral): Secondary | ICD-10-CM | POA: Insufficient documentation

## 2018-04-04 MED ORDER — VITAMIN D (ERGOCALCIFEROL) 1.25 MG (50000 UNIT) PO CAPS: 50000.0000 [IU] | ORAL_CAPSULE | ORAL | 10 refills | Status: DC

## 2018-04-04 NOTE — Telephone Encounter (Signed)
Patient states Dr.Opalski told her Vit D /Rx to be sent to pharmacy but ,Rx does not show in list [facility] to [facility].  ---Forwarding message to medical assistant. --Dion Body

## 2018-04-04 NOTE — Telephone Encounter (Signed)
Please advise if RX for Vitamin D needs to be called into the pharmacy. MPulliam, CMA/RT(R)

## 2018-04-04 NOTE — Progress Notes (Signed)
Assessment and plan:  1. Prediabetes-new onset   2. Elevated LDL cholesterol level-  10-year risk less than 2% 5/19-new onset   3. Hypertriglyceridemia-new onset   4. Morbid obesity ([place])   5. Vitamin D insufficiency-new onset   6. Health education/counseling   7. Family history of diabetes mellitus in mother-   in her mid 59s     Labs Reviewed Today (drawn 04/01/2018).  1. Prediabetes  - HbA1c - 6.1  - Recommended weight loss through prudent diet and exercise to help delay or prevent diabetic diagnosis.  - Re-check in 3-4 months.  Refer to dietary guidelines in handout on glycemic index, diabetic nutrition, and preventing onset of diabetes.  - Advised that fasting sugars should be 65-99.  In prediabetes, fasting blood sugars range 100 to 125.   - Advised patient that fruit is one of the best carbohydrates to choose as opposed to pasta, bread, or crackers.  Emphasized the importance of avoiding sugary drinks.  - Patient should use stevia as opposed to splenda.  2. Labs Reviewed with pt in detail - T4 and TSH - WNL. - Electrolytes - WNL - Liver Function - WNL - Hemoglobin & Hematocrit - WNL - White Blood Cells - WNL - Platelets - WNL  - B12 - low normal range, WNL. - Recommended regular-strength B Complex Multivitamin to prevent B12 from going lower. - Advised patient to internet search for foods rich in B12 as well.   Cholesterol - Reviewed "good fats vs bad fats," saturated fats, trans fats, and fatty carbs.  Triglycerides = 182 HDL = 52 LDL = 177, goal reviewed as less than 99.  The 10-year ASCVD risk score Mikey Bussing DC Brooke Bonito., et al., 2013) is: 1.3%   Values used to calculate the score:     Age: 42 years     Sex: Female     Is Non-Hispanic African American: No     Diabetic: No     Tobacco smoker: No     Systolic Blood Pressure: 132 mmHg     Is BP treated: No     HDL Cholesterol: 52 mg/dL     Total  Cholesterol: 265 mg/dL  - Advised the consumption of unsaturated fats as opposed to saturated.  Cut back on fatty carbs.  - Emphasized the importance of exercise in elevating HDL and minimizing LDL.  - Handout provided today on cholesterol management.  3. Vitamin D Deficiency - 24.2 on last check.  - Once weekly Vitamin D supplementation prescribed today. - Re-check in 4-6 months.  4. Kidney Function - Encouraged adequate hydration and exercise to keep the kidneys healthy.  5. BMI Counseling Explained to patient what BMI refers to, and what it means medically.    Told patient to think about it as a "medical risk stratification measurement" and how increasing BMI is associated with increasing risk/ or worsening state of various diseases such as hypertension, hyperlipidemia, diabetes, premature OA, depression etc.  American Heart Association guidelines for healthy diet, basically Mediterranean diet, and exercise guidelines of 30 minutes 5 days per week or more discussed in detail.  Health counseling performed.  All questions answered.  6. General Health Maintenance - Advised patient to continue working toward exercising to improve health.    - Patient will begin with 15 minutes of activity daily.  Recommended that the patient eventually strive for at least 150 minutes of moderate cardiovascular activity per week according to guidelines established by the [facility].   -  Healthy dietary habits encouraged, including low-carb, and high amounts of lean protein in diet.   - Advised patient to begin tracking all caloric intake with the LoseIt or MyFitnessPal apps.  - Patient should also consume adequate amounts of water - half of body weight in oz of water per day.   Education and routine counseling performed. Handouts provided.  7. Follow-Up - Check in 4-6 months, if patient is really working on it.  - 3-4 months, re-check A1c and Vitamin D.  No orders of the defined types were placed in  this encounter.   Meds ordered this encounter  Medications  . Vitamin D, Ergocalciferol, (DRISDOL) 50000 units CAPS capsule    Sig: Take 1 capsule (50,000 Units total) by mouth every 7 (seven) days.    Dispense:  12 capsule    Refill:  10    Return for 3-4 mo- reck a1c, vit D 1 wk prior to OV with me.   Anticipatory guidance and routine counseling done re: condition, txmnt options and need for follow up. All questions of patient's were answered.   Gross side effects, risk and benefits, and alternatives of medications discussed with patient.  Patient is aware that all medications have potential side effects and we are unable to predict every sideeffect or drug-drug interaction that may occur.  Expresses verbal understanding and consents to current therapy plan and treatment regiment.  Please see AVS handed out to patient at the end of our visit for additional patient instructions/ counseling done pertaining to today's office visit.  Note: This document was prepared using Dragon voice recognition software and may include unintentional dictation errors.  This document serves as a record of services personally performed by Mellody Dance, DO. It was created on her behalf by Toni Amend, a trained medical scribe. The creation of this record is based on the scribe's personal observations and the provider's statements to them.   I have reviewed the above medical documentation for accuracy and completeness and I concur.  Mellody Dance 04/04/18 1:36 PM   ----------------------------------------------------------------------------------------------------------------------  Subjective:   CC:   Cheryl [name] is a 42 y.o. female who presents to [place] [facility] today for review and discussion of recent bloodwork that was done.  1. All recent blood work that we ordered was reviewed with patient today.  Patient was counseled on all abnormalities and we  discussed dietary and lifestyle changes that could help those values (also medications when appropriate).  Extensive health counseling performed and all patient's concerns/ questions were addressed.   Today patient notes that mother had diabetes mellitus as early as her mid 51's.  Weight Loss & Exercise Habits Patient did Julien Girt diet in the past  Patient went from regular sugar to splenda and has been trying to lose weight.  Notes that she's lost some weight so far.  She has been using Weight Watchers as a guide.   Wt Readings from Last 3 Encounters:  04/04/18 212 lb 6.4 oz (96.3 kg)  03/03/18 204 lb 11.2 oz (92.9 kg)  12/27/17 214 lb 4.8 oz (97.2 kg)   BP Readings from Last 3 Encounters:  04/04/18 130/84  03/03/18 130/90  12/27/17 (!) 125/94   Pulse Readings from Last 3 Encounters:  04/04/18 74  03/03/18 97  12/27/17 (!) 104   BMI Readings from Last 3 Encounters:  04/04/18 38.85 kg/m  03/03/18 37.44 kg/m  12/27/17 40.49 kg/m     Patient Care Team    Relationship  Specialty Notifications Start End  Mellody Dance, DO PCP - General Family Medicine  03/03/18   [name], MD Consulting Physician Obstetrics and Gynecology  02/13/15   [name], MD Consulting Physician General Surgery  02/13/15   Magrinat, Virgie Dad, MD Consulting Physician Oncology  02/13/15   [name], RN Registered Nurse   02/13/15   [name], RN Registered Nurse   02/13/15   Dillingham, [name], DO Attending Physician Plastic Surgery  03/21/17     Full medical history updated and reviewed in the office today  Patient Active Problem List   Diagnosis Date Noted  . Glucose intolerance (impaired glucose tolerance) 04/04/2018  . Elevated LDL cholesterol level-  10-year risk less than 2% 5/19 04/04/2018  . Hypertriglyceridemia 04/04/2018  . Morbid obesity ([place]) 04/04/2018  . Vitamin D insufficiency 04/04/2018  . Health education/counseling 04/04/2018  . Family history of  diabetes mellitus in mother-   in her mid 58s 04/04/2018  . Neuropathy 03/13/2018  . History of therapeutic radiation- 30+ txmnts 03/03/2018  . Port-A-Cath in place 09/09/2017  . Focal nodular hyperplasia of liver by MRI  07/26/2017  . Acquired absence of bilateral breasts and nipples 04/27/2017  . Recurrent breast cancer, left ([place]) 03/18/2017  . Genetic testing 03/05/2016  . Chemotherapy-induced neuropathy ([place]) 07/11/2015  . Bronchitis 06/13/2015  . Flank pain 05/30/2015  . UTI (urinary tract infection) 05/16/2015  . Malignant neoplasm of upper-outer quadrant of left breast in female, estrogen receptor negative ([place]) 02/13/2015    Past Medical History:  Diagnosis Date  . Breast cancer ([place])    left breast  . Wears contact lenses     Past Surgical History:  Procedure Laterality Date  . BILATERAL TOTAL MASTECTOMY WITH AXILLARY LYMPH NODE DISSECTION  04/20/2017  . BREAST BIOPSY Left 02/18/2015  . BREAST BIOPSY Right 02/18/2015  . BREAST EXCISIONAL BIOPSY Left 04/05/2015  . BREAST LUMPECTOMY Left 03/27/2015  . BREAST LUMPECTOMY WITH NEEDLE LOCALIZATION AND AXILLARY SENTINEL LYMPH NODE BX Left 03/27/2015   Procedure: LEFT BREAST LUMPECTOMY WITH BRACKETED NEEDLE LOCALIZATION AND LEFT  AXILLARY SENTINEL LYMPH NODE BX;  Surgeon: [name], MD;  Location: [place];  Service: General;  Laterality: Left;  . BREAST RECONSTRUCTION WITH PLACEMENT OF TISSUE EXPANDER AND FLEX HD (ACELLULAR HYDRATED DERMIS) Bilateral 04/20/2017   Procedure: BILATARAL BREAST RECONSTRUCTION WITH PLACEMENT OF TISSUE EXPANDER AND FLEX HD (ACELLULAR HYDRATED DERMIS);  Surgeon: Wallace Going, DO;  Location: [place];  Service: Plastics;  Laterality: Bilateral;  . BREAST SURGERY    . CHOLECYSTECTOMY N/A 07/07/2017   Procedure: LAPAROSCOPIC CHOLECYSTECTOMY WITH INTRAOPERATIVE CHOLANGIOGRAM;  Surgeon: [name], MD;  Location: [facility];  Service: General;  Laterality: N/A;  . MASTECTOMY  W/ SENTINEL NODE BIOPSY Bilateral 04/20/2017   Procedure: BILATERAL TOTAL MASTECTOMY WITH LEFT AXILLARY SENTINEL LYMPH NODE BIOPSY;  Surgeon: [name], MD;  Location: [place];  Service: General;  Laterality: Bilateral;  . PORTA CATH REMOVAL Right 11/04/2017   Procedure: PORTA CATH REMOVAL;  Surgeon: Wallace Going, DO;  Location: [place];  Service: Plastics;  Laterality: Right;  . PORTACATH PLACEMENT Right 04/25/2015   Procedure: INSERTION PORT-A-CATH;  Surgeon: [name], MD;  Location: [place];  Service: General;  Laterality: Right;  . PORTACATH PLACEMENT Right 04/20/2017   Procedure: INSERTION PORT-A-CATH;  Surgeon: [name], MD;  Location: [place];  Service: General;  Laterality: Right;  . RE-EXCISION OF BREAST LUMPECTOMY Left 04/05/2015   Procedure: RE-EXCISION  LEFT  BREAST LUMPECTOMY;  Surgeon: [name], MD;  Location: [place];  Service: General;  Laterality: Left;  . REMOVAL OF TISSUE EXPANDER AND PLACEMENT OF IMPLANT Bilateral 11/04/2017   Procedure: BILATERAL REMOVAL OF TISSUE EXPANDER AND PLACEMENT OF  BILATERAL BREAST IMPLANT;  Surgeon: Wallace Going, DO;  Location: [place];  Service: Plastics;  Laterality: Bilateral;    Social History   Tobacco Use  . Smoking status: Never Smoker  . Smokeless tobacco: Never Used  Substance Use Topics  . Alcohol use: Yes    Alcohol/week: 0.0 oz    Comment: occ    Family Hx: Family History  Problem Relation Age of Onset  . Lung cancer Maternal Grandmother 12       non smoker  . Lung cancer Maternal Grandfather 40       non smoker worked in [employer]  . Cancer Paternal Grandfather 35       throat cancer ? smoker  . Cancer Cousin 41       throat cancer ? smoker     Medications: Current Outpatient Medications  Medication Sig Dispense Refill  . anastrozole (ARIMIDEX) 1 MG tablet Take 1 tablet (1 mg total) by mouth daily. 90  tablet 4  . gabapentin (NEURONTIN) 300 MG capsule Take 1 capsule (300 mg total) by mouth at bedtime. (Patient not taking: Reported on 04/04/2018) 90 capsule 4  . Vitamin D, Ergocalciferol, (DRISDOL) 50000 units CAPS capsule Take 1 capsule (50,000 Units total) by mouth every 7 (seven) days. 12 capsule 10   No current facility-administered medications for this visit.     Allergies:  Allergies  Allergen Reactions  . Adhesive [Tape] Other (See Comments)    Skin blisters     Review of Systems: General:   No F/C, wt loss Pulm:   No DIB, SOB, pleuritic chest pain Card:  No CP, palpitations Abd:  No n/v/d or pain Ext:  No inc edema from baseline  Objective:  Blood pressure 130/84, pulse 74, height '5\' 2"'  (1.575 m), weight 212 lb 6.4 oz (96.3 kg), last menstrual period 04/02/2015, SpO2 100 %. Body mass index is 38.85 kg/m. Gen:   Well NAD, A and O *3 HEENT:    [place]/AT, EOMI,  MMM Lungs:   Normal work of breathing. CTA B/L, no Wh, rhonchi Heart:   RRR, S1, S2 WNL's, no MRG Abd:   No gross distention Exts:    warm, pink,  Brisk capillary refill, warm and well perfused.  Psych:    No HI/SI, judgement and insight good, Euthymic mood. Full Affect.   Recent Results (from the past 2160 hour(s))  Comprehensive metabolic panel     Status: None   Collection Time: 03/24/18  3:49 PM  Result Value Ref Range   Sodium 140 136 - 145 mmol/L   Potassium 4.3 3.5 - 5.1 mmol/L   Chloride 103 98 - 109 mmol/L   CO2 27 22 - 29 mmol/L   Glucose, Bld 117 70 - 140 mg/dL   BUN 14 7 - 26 mg/dL   Creatinine, Ser 0.88 0.60 - 1.10 mg/dL   Calcium 9.9 8.4 - 10.4 mg/dL   Total Protein 7.7 6.4 - 8.3 g/dL   Albumin 4.2 3.5 - 5.0 g/dL   AST 18 5 - 34 U/L   ALT 27 0 - 55 U/L   Alkaline Phosphatase 94 40 - 150 U/L   Total Bilirubin 0.7 0.2 - 1.2 mg/dL   GFR calc  non Af Amer >60 >60 mL/min   GFR calc Af Amer >60 >60 mL/min    Comment: (NOTE) The eGFR has been calculated using the CKD EPI equation. This  calculation has not been validated in all clinical situations. eGFR's persistently <60 mL/min signify possible Chronic Kidney Disease.    Anion gap 10 3 - 11    Comment: Performed at [facility], [address], [place], [place] [address]  CBC with Differential     Status: None   Collection Time: 03/24/18  3:49 PM  Result Value Ref Range   WBC 7.2 3.9 - 10.3 K/uL   RBC 4.15 3.70 - 5.45 MIL/uL   Hemoglobin 12.2 11.6 - 15.9 g/dL   HCT 36.8 34.8 - 46.6 %   MCV 88.7 79.5 - 101.0 fL   MCH 29.4 25.1 - 34.0 pg   MCHC 33.2 31.5 - 36.0 g/dL   RDW 13.9 11.2 - 14.5 %   Platelets 319 145 - 400 K/uL   Neutrophils Relative % 62 %   Neutro Abs 4.5 1.5 - 6.5 K/uL   Lymphocytes Relative 30 %   Lymphs Abs 2.1 0.9 - 3.3 K/uL   Monocytes Relative 6 %   Monocytes Absolute 0.4 0.1 - 0.9 K/uL   Eosinophils Relative 2 %   Eosinophils Absolute 0.1 0.0 - 0.5 K/uL   Basophils Relative 0 %   Basophils Absolute 0.0 0.0 - 0.1 K/uL    Comment: Performed at [facility], [place] [address] [place], [place] [address]  T4, free     Status: None   Collection Time: 04/01/18  8:25 AM  Result Value Ref Range   Free T4 1.06 0.82 - 1.77 ng/dL  Vitamin B12     Status: None   Collection Time: 04/01/18  8:25 AM  Result Value Ref Range   Vitamin B-12 261 232 - 1,245 pg/mL  Lipid panel     Status: Abnormal   Collection Time: 04/01/18  8:25 AM  Result Value Ref Range   Cholesterol, Total 265 (H) 100 - 199 mg/dL   Triglycerides 182 (H) 0 - 149 mg/dL   HDL 52 >39 mg/dL   VLDL Cholesterol Cal 36 5 - 40 mg/dL   LDL Calculated 177 (H) 0 - 99 mg/dL   Chol/HDL Ratio 5.1 (H) 0.0 - 4.4 ratio    Comment:                                   T. Chol/HDL Ratio                                             Men  Women                               1/2 Avg.Risk  3.4    3.3                                   Avg.Risk  5.0    4.4  2X Avg.Risk  9.6    7.1                                 3X Avg.Risk 23.4   11.0   VITAMIN D 25 Hydroxy (Vit-D Deficiency, Fractures)     Status: Abnormal   Collection Time: 04/01/18  8:25 AM  Result Value Ref Range   Vit D, 25-Hydroxy 24.2 (L) 30.0 - 100.0 ng/mL    Comment: Vitamin D deficiency has been defined by the [place] practice guideline as a level of serum 25-OH vitamin D less than 20 ng/mL (1,2). The Endocrine Society went on to further define vitamin D insufficiency as a level between 21 and 29 ng/mL (2). 1. IOM (Institute of Medicine). 2010. Dietary reference    intakes for calcium and D. [place]: The    [employer]. 2. Holick MF, Binkley [place], Bischoff-Ferrari HA, et al.    Evaluation, treatment, and prevention of vitamin D    deficiency: an Endocrine Society clinical practice    guideline. JCEM. 2011 Jul; 96(7):1911-30.   TSH     Status: None   Collection Time: 04/01/18  8:25 AM  Result Value Ref Range   TSH 2.170 0.450 - 4.500 uIU/mL  Comprehensive metabolic panel     Status: Abnormal   Collection Time: 04/01/18  8:25 AM  Result Value Ref Range   Glucose 108 (H) 65 - 99 mg/dL   BUN 11 6 - 24 mg/dL   Creatinine, Ser 0.76 0.57 - 1.00 mg/dL   GFR calc non Af Amer 96 >59 mL/min/1.73   GFR calc Af Amer 111 >59 mL/min/1.73   BUN/Creatinine Ratio 14 9 - 23   Sodium 142 134 - 144 mmol/L   Potassium 4.6 3.5 - 5.2 mmol/L   Chloride 102 96 - 106 mmol/L   CO2 25 20 - 29 mmol/L   Calcium 9.8 8.7 - 10.2 mg/dL   Total Protein 6.9 6.0 - 8.5 g/dL   Albumin 4.3 3.5 - 5.5 g/dL   Globulin, Total 2.6 1.5 - 4.5 g/dL   Albumin/Globulin Ratio 1.7 1.2 - 2.2   Bilirubin Total 0.8 0.0 - 1.2 mg/dL   Alkaline Phosphatase 89 39 - 117 IU/L   AST 19 0 - 40 IU/L   ALT 25 0 - 32 IU/L  CBC with Differential/Platelet     Status: None   Collection Time: 04/01/18  8:25 AM  Result Value Ref Range   WBC 5.7 3.4 - 10.8 x10E3/uL   RBC 4.18 3.77 - 5.28 x10E6/uL    Hemoglobin 12.2 11.1 - 15.9 g/dL   Hematocrit 36.4 34.0 - 46.6 %   MCV 87 79 - 97 fL   MCH 29.2 26.6 - 33.0 pg   MCHC 33.5 31.5 - 35.7 g/dL   RDW 14.6 12.3 - 15.4 %   Platelets 348 150 - 379 x10E3/uL   Neutrophils 60 Not Estab. %   Lymphs 30 Not Estab. %   Monocytes 6 Not Estab. %   Eos 3 Not Estab. %   Basos 1 Not Estab. %   Neutrophils Absolute 3.5 1.4 - 7.0 x10E3/uL   Lymphocytes Absolute 1.7 0.7 - 3.1 x10E3/uL   Monocytes Absolute 0.4 0.1 - 0.9 x10E3/uL   EOS (ABSOLUTE) 0.2 0.0 - 0.4 x10E3/uL   Basophils Absolute 0.0 0.0 - 0.2 x10E3/uL   Immature Granulocytes 0 Not Estab. %  Immature Grans (Abs) 0.0 0.0 - 0.1 x10E3/uL  Hemoglobin A1c     Status: Abnormal   Collection Time: 04/01/18  8:25 AM  Result Value Ref Range   Hgb A1c MFr Bld 6.1 (H) 4.8 - 5.6 %    Comment:          Prediabetes: 5.7 - 6.4          Diabetes: >6.4          Glycemic control for adults with diabetes: <7.0    Est. average glucose Bld gHb Est-mCnc 128 mg/dL

## 2018-04-04 NOTE — Telephone Encounter (Signed)
Told her we would send a vitamin D prescription 50,000 units to be taken once weekly. I will do so now- thnks for reminder and tell pt thanks for asking about it!

## 2018-04-04 NOTE — Patient Instructions (Signed)
Use lose it or my fitness pal to help you with weight loss and tracking your dietary regimen    Risk factors for prediabetes and type 2 diabetes  Researchers don't fully understand why some people develop prediabetes and type 2 diabetes and others don't.  It's clear that certain factors increase the risk, however, including:  Weight. The more fatty tissue you have, the more resistant your cells become to insulin.  Inactivity. The less active you are, the greater your risk. Physical activity helps you control your weight, uses up glucose as energy and makes your cells more sensitive to insulin.  Family history. Your risk increases if a parent or sibling has type 2 diabetes.  Race. Although it's unclear why, people of certain races - including blacks, Hispanics, American Indians and Asian-Americans - are at higher risk.  Age. Your risk increases as you get older. This may be because you tend to exercise less, lose muscle mass and gain weight as you age. But type 2 diabetes is also increasing dramatically among children, adolescents and younger adults.  Gestational diabetes. If you developed gestational diabetes when you were pregnant, your risk of developing prediabetes and type 2 diabetes later increases. If you gave birth to a baby weighing more than 9 pounds (4 kilograms), you're also at risk of type 2 diabetes.  Polycystic ovary syndrome. For women, having polycystic ovary syndrome - a common condition characterized by irregular menstrual periods, excess hair growth and obesity - increases the risk of diabetes.  High blood pressure. Having blood pressure over 140/90 millimeters of mercury (mm Hg) is linked to an increased risk of type 2 diabetes.  Abnormal cholesterol and triglyceride levels. If you have low levels of high-density lipoprotein (HDL), or "good," cholesterol, your risk of type 2 diabetes is higher. Triglycerides are another type of fat carried in the blood. People with high levels  of triglycerides have an increased risk of type 2 diabetes. Your doctor can let you know what your cholesterol and triglyceride levels are.  A good guide to good carbs: The glycemic index ---If you have diabetes, or at risk for diabetes, you know all too well that when you eat carbohydrates, your blood sugar goes up. The total amount of carbs you consume at a meal or in a snack mostly determines what your blood sugar will do. But the food itself also plays a role. A serving of white rice has almost the same effect as eating pure table sugar - a quick, high spike in blood sugar. A serving of lentils has a slower, smaller effect.  ---Picking good sources of carbs can help you control your blood sugar and your weight. Even if you don't have diabetes, eating healthier carbohydrate-rich foods can help ward off a host of chronic conditions, from heart disease to various cancers to, well, diabetes.  ---One way to choose foods is with the glycemic index (GI). This tool measures how much a food boosts blood sugar.  The glycemic index rates the effect of a specific amount of a food on blood sugar compared with the same amount of pure glucose. A food with a glycemic index of 28 boosts blood sugar only 28% as much as pure glucose. One with a GI of 95 acts like pure glucose.    High glycemic foods result in a quick spike in insulin and blood sugar (also known as blood glucose).  Low glycemic foods have a slower, smaller effect- these are healthier for you.   Using the  glycemic index Using the glycemic index is easy: choose foods in the low GI category instead of those in the high GI category (see below), and go easy on those in between. Low glycemic index (GI of 55 or less): Most fruits and vegetables, beans, minimally processed grains, pasta, low-fat dairy foods, and nuts.  Moderate glycemic index (GI 56 to 69): White and sweet potatoes, corn, white rice, couscous, breakfast cereals such as Cream of Wheat and  Mini Wheats.  High glycemic index (GI of 70 or higher): White bread, rice cakes, most crackers, bagels, cakes, doughnuts, croissants, most packaged breakfast cereals. You can see the values for 100 commons foods and get links to more at www.[email or web address].si.  Swaps for lowering glycemic index  Instead of this high-glycemic index food Eat this lower-glycemic index food  White rice Brown rice or converted rice  Instant oatmeal Steel-cut oats  Cornflakes Bran flakes  Baked potato Pasta, bulgur  White bread Whole-grain bread  Corn Peas or leafy greens       Prediabetes Eating Plan  Prediabetes--also called impaired glucose tolerance or impaired fasting glucose--is a condition that causes blood sugar (blood glucose) levels to be higher than normal. Following a healthy diet can help to keep prediabetes under control. It can also help to lower the risk of type 2 diabetes and heart disease, which are increased in people who have prediabetes. Along with regular exercise, a healthy diet:  Promotes weight loss.  Helps to control blood sugar levels.  Helps to improve the way that the body uses insulin.   WHAT DO I NEED TO KNOW ABOUT THIS EATING PLAN?   Use the glycemic index (GI) to plan your meals. The index tells you how quickly a food will raise your blood sugar. Choose low-GI foods. These foods take a longer time to raise blood sugar.  Pay close attention to the amount of carbohydrates in the food that you eat. Carbohydrates increase blood sugar levels.  Keep track of how many calories you take in. Eating the right amount of calories will help you to achieve a healthy weight. Losing about 7 percent of your starting weight can help to prevent type 2 diabetes.  You may want to follow a Mediterranean diet. This diet includes a lot of vegetables, lean meats or fish, whole grains, fruits, and healthy oils and fats.   WHAT FOODS CAN I EAT?  Grains Whole grains, such as  whole-wheat or whole-grain breads, crackers, cereals, and pasta. Unsweetened oatmeal. Bulgur. Barley. Quinoa. Brown rice. Corn or whole-wheat flour tortillas or taco shells. Vegetables Lettuce. Spinach. Peas. Beets. Cauliflower. Cabbage. Broccoli. Carrots. Tomatoes. Squash. Eggplant. Herbs. Peppers. Onions. Cucumbers. Brussels sprouts. Fruits Berries. Bananas. Apples. Oranges. Grapes. Papaya. Mango. Pomegranate. Kiwi. Grapefruit. Cherries. Meats and Other Protein Sources Seafood. Lean meats, such as chicken and [place] or lean cuts of pork and beef. Tofu. Eggs. Nuts. Beans. Dairy Low-fat or fat-free dairy products, such as yogurt, cottage cheese, and cheese. Beverages Water. Tea. Coffee. Sugar-free or diet soda. Seltzer water. Milk. Milk alternatives, such as soy or almond milk. Condiments Mustard. Relish. Low-fat, low-sugar ketchup. Low-fat, low-sugar barbecue sauce. Low-fat or fat-free mayonnaise. Sweets and Desserts Sugar-free or low-fat pudding. Sugar-free or low-fat ice cream and other frozen treats. Fats and Oils Avocado. Walnuts. Olive oil. The items listed above may not be a complete list of recommended foods or beverages. Contact your dietitian for more options.    WHAT FOODS ARE NOT RECOMMENDED?  Grains Refined white flour and flour products,  such as bread, pasta, snack foods, and cereals. Beverages Sweetened drinks, such as sweet iced tea and soda. Sweets and Desserts Baked goods, such as cake, cupcakes, pastries, cookies, and cheesecake. The items listed above may not be a complete list of foods and beverages to avoid. Contact your dietitian for more information.   This information is not intended to replace advice given to you by your health care provider. Make sure you discuss any questions you have with your health care provider.   Document Released: 03/26/2015 Document Reviewed: 03/26/2015 Elsevier Interactive Patient Education 2016 [place] and dirty--> lower triglyceride levels more through...  1) - Beware of bad fats: Cutting back on saturated fat (in red meat and full-fat dairy foods) and trans fats (in restaurant fried foods and commercially prepared baked goods) can lower triglycerides.  2) - Go for good carbs: Easily digested carbohydrates (such as white bread, white rice, cornflakes, and sugary sodas) give triglycerides a definite boost.   3) - Eating whole grains and cutting back on soda can help control triglycerides.  4) - Check your alcohol use. In some people, alcohol dramatically boosts triglycerides. The only way to know if this is true for you is to avoid alcohol for a few weeks and have your triglycerides tested again.  5) - Go fish. Omega-3 fats in salmon, tuna, sardines, and other fatty fish can lower triglycerides. Having fish twice a week is fine.  6) - Aim for a healthy weight. If you are overweight, losing just 5% to 10% of your weight can help drive down triglycerides.  7) - Get moving. Exercise lowers triglycerides and boosts heart-healthy HDL cholesterol.  8) - quit smoking if you do  --> for more information, see below; or go to  www.heart.org  and do a search for desired topics  For those diagnosed with high triglycerides, it's important to take action to lower your levels and improve your heart health.  Triglyceride is just a fancy word for fat - the fat in our bodies is stored in the form of triglycerides. Triglycerides are found in foods and manufactured in our bodies.  Normal triglyceride levels are defined as less than 150 mg/dL; 150 to 199 is considered borderline high; 200 to 499 is high; and 500 or higher is officially called very high. To me, anything over 150 is a red flag indicating my patient needs to take immediate steps to get the situation under control.   What is the significance of high triglycerides? High triglyceride levels make blood thicker and stickier, which means that it  is more likely to form clots. Studies have shown that triglyceride levels are associated with increased risks of cardiovascular disease and stroke - in both men and women - alone or in combination with other risk factors (high triglycerides combined with high LDL cholesterol can be a particularly deadly combination). For example, in one ground-breaking study, high triglycerides alone increased the risk of cardiovascular disease by 14 percent in men, and by 94 percent in women. But when the test subjects also had low HDL cholesterol (that's the good cholesterol) and other risk factors, high triglycerides increased the risk of disease by 32 percent in men and 76 percent in women.   Fortunately, triglycerides can sometimes be controlled with several diet and lifestyle changes.    What Factors Can Increase Triglycerides? As with cholesterol, eating too much of the wrong kinds of fats will raise your blood  triglycerides.  Therefore, it's important to restrict the amounts of saturated fats and trans fats you allow into your diet.  Triglyceride levels can also shoot up after eating foods that are high in carbohydrates or after drinking alcohol.  That's why triglyceride blood tests require an overnight fast.  If you have elevated triglycerides, it's especially important to avoid sugary and refined carbohydrates, including sugar, honey, and other sweeteners, soda and other sugary drinks, candy, baked goods, and anything made with white (refined or enriched) flour, including white bread, rolls, cereals, buns, pastries, regular pasta, and white rice.  You'll also want to limit dried fruit and fruit juice since they're dense in simple sugar.  All of these low-quality carbs cause a sudden rise in insulin, which may lead to a spike in triglycerides.  Triglycerides can also become elevated as a reaction to having diabetes, hypothyroidism, or kidney disease. As with most other heart-related factors, being overweight and  inactive also contribute to abnormal triglycerides. And unfortunately, some people have a genetic predisposition that causes them to manufacture way too much triglycerides on their own, no matter how carefully they eat.     How Can You Lower Your Triglyceride Levels? If you are diagnosed with high triglycerides, it's important to take action. There are several things you can do to help lower your triglyceride levels and improve your heart health:  --> Lose weight if you are overweight.  There is a clear correlation between obesity and high triglycerides - the heavier people are, the higher their triglyceride levels are likely to be. The good news is that losing weight can significantly lower triglycerides. In a large study of individuals with type 2 diabetes, those assigned to the "lifestyle intervention group" - which involved counseling, a low-calorie meal plan, and customized exercise program - lost 8.6% of their body weight and lowered their triglyceride levels by more than 16%. If you're overweight, find a weight loss plan that works for you and commit to shedding the pounds and getting healthier.  --> Reduce the amount of saturated fat and trans fat in your diet.  Start by avoiding or dramatically limiting butter, cream cheese, lard, sour cream, doughnuts, cakes, cookies, candy bars, regular ice cream, fried foods, pizza, cheese sauce, cream-based sauces and salad dressings, high-fat meats (including fatty hamburgers, bologna, pepperoni, sausage, bacon, salami, pastrami, spareribs, and hot dogs), high-fat cuts of beef and pork, and whole-milk dairy products.   Other ways to cut back: Choose lean meats only (including skinless chicken and [place], lean beef, lean pork), fish, and reduced-fat or fat-free dairy products.   Experiment with adding whole soy foods to your diet. Although soy itself may not reduce risk of heart disease, it replaces hazardous animal fats with healthier proteins. Choose  high-quality soy foods, such as tofu, tempeh, soy milk, and edamame (whole soybeans).  Always remove skin from poultry.  Prepare foods by baking, roasting, broiling, boiling, poaching, steaming, grilling, or stir-frying in vegetable oil.  Most stick margarines contain trans fats, and trans fats are also found in some packaged baked goods, potato chips, snack foods, fried foods, and fast food that use or create hydrogenated oils.    (All food labels must now list the amount of trans fats, right after the amount of saturated fats - good news for consumers. As a result, many food companies have now reformulated their products to be trans fat free.many, but not all! So it's still just as important to read labels and make sure the packaged  foods you buy don't contain trans fats.)     If you use margarine, purchase soft-tub margarine spreads that contain 0 grams trans fats and don't list any partially hydrogenated oils in the ingredients list. By substituting olive oil or vegetable oil for trans fats in just 2 percent of your daily calories, you can reduce your risk of heart disease by 53 percent.   There is no safe amount of trans fats, so try to keep them as far from your plate as possible.  -->  Avoid foods that are concentrated in sugar (even dried fruit and fruit juice). Sugary foods can elevate triglyceride levels in the blood, so keep them to a bare minimum.  --> Swap out refined carbohydrates for whole grains.  Refined carbohydrates - like white rice, regular pasta, and anything made with white or "enriched" flour (including white bread, rolls, cereals, buns, and crackers) - raise blood sugar and insulin levels more than fiber-rich whole grains. Higher insulin levels, in turn, can lead to a higher rise in triglycerides after a meal. So, make the switch to whole wheat bread, whole grain pasta, brown or wild rice, and whole grain versions of cereals, crackers, and other bread products. However, it's  important to know that individuals with high triglycerides should moderate even their intake of high-quality starches (since all starches raise blood sugar) - I recommend 1 to 2 servings per meal.  --> Cut way back on alcohol.  If you have high triglycerides, alcohol should be considered a rare treat - if you indulge at all, since even small amounts of alcohol can dramatically increase triglyceride levels.  --> Incorporate omega-3 fats.  Heart-healthy fish oils are especially rich in omega-3 fatty acids. In multiple studies over the past two decades, people who ate diets high in omega-3s had 30 to 40 percent reductions in heart disease. Although we don't yet know why fish oil works so well, there are several possibilities. Omega-3s seem to reduce inflammation, reduce high blood pressure, decrease triglycerides, raise HDL cholesterol, and make blood thinner and less sticky so it is less likely to clot. It's as close to a food prescription for heart health as it gets. If you have high triglycerides, I recommend eating at least three servings of one of the omega-3-rich fish every week (fatty fish is the most concentrated food form of omega three fats). If you cannot manage to eat that much fish, speak with your physician about taking fish oil capsules, which offer similar benefits.The best foods for omega-3 fatty acids include wild salmon (fresh, canned), herring, mackerel (not king), sardines, anchovies, rainbow trout, and Pacific oysters. Non-fish sources of omega-3 fats include omega-3-fortified eggs, ground flaxseed, chia seeds, walnuts, butternuts (white walnuts), seaweed, walnut oil, canola oil, and soybeans.  --> Quit smoking.  Smoking causes inflammation, not just in your lungs, but throughout your body. Inflammation can contribute to atherosclerosis, blood clots, and risk of heart attack. Smoking makes all heart health indicators worse. If you have high cholesterol, high triglycerides, or high blood  pressure, smoking magnifies the danger.  --> Become more physically active.  Even moderate exercise can help improve cholesterol, triglycerides, and blood pressure. Aerobic exercise seems to be able to stop the sharp rise of triglycerides after eating, perhaps because of a decrease in the amount of triglyceride released by the liver, or because active muscle clears triglycerides out of the blood stream more quickly than inactive muscle. If you haven't exercised regularly (or at all) for years, I recommend  starting slowly, by walking at an easy pace for 15 minutes a day. Then, as you feel more comfortable, increase the amount. Your ultimate goal should be at least 30 minutes of moderate physical activity, at least five days a week.         Guidelines for a Low Cholesterol, Low Saturated Fat Diet   Fats - Limit total intake of fats and oils. - Avoid butter, stick margarine, shortening, lard, palm and coconut oils. - Limit mayonnaise, salad dressings, gravies and sauces, unless they are homemade with low-fat ingredients. - Limit chocolate. - Choose low-fat and nonfat products, such as low-fat mayonnaise, low-fat or non-hydrogenated peanut butter, low-fat or fat-free salad dressings and nonfat gravy. - Use vegetable oil, such as canola or olive oil. - Look for margarine that does not contain trans fatty acids. - Use nuts in moderate amounts. - Read ingredient labels carefully to determine both amount and type of fat present in foods. Limit saturated and trans fats! - Avoid high-fat processed and convenience foods.  Meats and Meat Alternatives - Choose fish, chicken, [place] and lean meats. - Use dried beans, peas, lentils and tofu. - Limit egg yolks to three to four per week. - If you eat red meat, limit to no more than three servings per week and choose loin or round cuts. - Avoid fatty meats, such as bacon, sausage, franks, luncheon meats and ribs. - Avoid all organ meats, including  liver.  Dairy - Choose nonfat or low-fat milk, yogurt and cottage cheese. - Most cheeses are high in fat. Choose cheeses made from non-fat milk, such as mozzarella and ricotta cheese. - Choose light or fat-free cream cheese and sour cream. - Avoid cream and sauces made with cream.  Fruits and Vegetables - Eat a wide variety of fruits and vegetables. - Use lemon juice, vinegar or "mist" olive oil on vegetables. - Avoid adding sauces, fat or oil to vegetables.  Breads, Cereals and Grains - Choose whole-grain breads, cereals, pastas and rice. - Avoid high-fat snack foods, such as granola, cookies, pies, pastries, doughnuts and croissants.  Cooking Tips - Avoid deep fried foods. - Trim visible fat off meats and remove skin from poultry before cooking. - Bake, broil, boil, poach or roast poultry, fish and lean meats. - Drain and discard fat that drains out of meat as you cook it. - Add little or no fat to foods. - Use vegetable oil sprays to grease pans for cooking or baking. - Steam vegetables. - Use herbs or no-oil marinades to flavor foods.

## 2018-04-04 NOTE — Telephone Encounter (Signed)
Patient notified. MPulliam, CMA/RT(R)  

## 2018-04-05 ENCOUNTER — Encounter: Payer: Self-pay | Admitting: Family Medicine

## 2018-04-14 NOTE — Progress Notes (Signed)
[place]  [phone] [phone]     ID: [name] DOB: [birth year]  MR#: [id number]  GEZ#:[id number]  Patient Care Team: Mellody Dance, DO as PCP - General (Family Medicine) [name], MD as Consulting Physician (Obstetrics and Gynecology) [name], MD as Consulting Physician (General Surgery) Magrinat, Virgie Dad, MD as Consulting Physician (Oncology) [name], RN as Registered Nurse [name], RN as Registered Nurse Dillingham, [name], DO as Attending Physician (Plastic Surgery) OTHER MD:  CHIEF COMPLAINT: Recurrent breast cancer  CURRENT TREATMENT: Anastrozole   INTERVAL HISTORY:  Omah returns today for a follow-up and treatment of her recurrent triple negative breast cancer.   The patient continues on anastrozole, which she tolerates well. She states her hot flashes are worse during the day so she hasn't gabapentin at night.   On 03/21/2018 she underwent a Bone Density Scan that showed a T-score of -1.4.  [name] exercising chiefly by walking at lunch at work and she also walks the dogs in addition  REVIEW OF SYSTEMS: Darlette is doing "alright". [name] debating on if she will undergo a bilateral oophorectomy to help lower her chances of ovarian cancer. The patient denies unusual headaches, visual changes, nausea, vomiting, or dizziness. There has been no unusual cough, phlegm production, or pleurisy. This been no change in bowel or bladder habits. The patient denies unexplained fatigue or unexplained weight loss, bleeding, rash, or fever. A detailed review of systems was otherwise noncontributory.   HISTORY OF RECURRENT DISEASE: From my 03/18/2017 note:  Michalene that he'll returns today for follow-up of her triple negative breast cancer, which is now recurrent. She had routine bilateral diagnostic mammography at the [facility] 03/10/2017 and this showed the breast density to be category  C. The old lumpectomy site, which was in the upper outer quadrant, appears stable. However there was a new area of asymmetry in the lower inner quadrant of the left breast. Ultrasound confirmed this to be a 1.0 cm mass at the 7:00 radiant 5 cm from the nipple. Aspiration was attempted but no fluid was aspirated and biopsy of this mass was obtained 03/05/2017. This showed ([id number]) invasive mammary carcinoma, grade 2, estrogen and progesterone receptor negative, HER-2 not amplified with a signals ratio 1.17 and a number per cell of 2.45, with an MIB-1 of 15%.  [name] here today to discuss subsequent management   BREAST CANCER HISTORY: From the original intake note:  The patient had screening mammography 02/01/2015 which showed a calcifications in the upper outer quadrant of the left breast. On 02/08/2015 she underwent left diagnostic mammography with tomosynthesis and left breast ultrasonography at the breast Center. The breast density was category C. Mammography confirmed a group of pleomorphic calcifications spanning 4.2 cm maximally. Physical examination of the upper outer quadrant of the left breast showed an area of thickening at the approximate 2:00 position. Ultrasound of this area showed no discrete masses. There was vague shadowing present. Calcifications could not be clearly identified sonographically. There was no lymphadenopathy in the left axilla.  On the same day the patient underwent biopsy of the left breast area in question, with the pathology (S [id number]) showing ductal carcinoma in situ, grade 2 or 3, with possible areas of microinvasion, the cancer cells being strongly estrogen and progesterone receptor positive, both at 100%.  On 02/15/2015 the patient underwent bilateral breast MRI. This showed the area of malignancy in the upper outer quadrant to measure 4.5 cm,  including a large area of non-masslike enhancement and a 1 cm spiculated mass. There was also an indeterminate oval  mass in the central left breast and another indeterminant mass in the slightly outer right breast anteriorly. Biopsy of the right breast mass 02/18/2015 ([id number]) showed a fibroadenoma, with no evidence of malignancy. Biopsy of 2 additional areas of the left breast (at 10:00 and 2:00) showed a fibroadenoma in the upper inner quadrant, and pseudo-angiomatous stromal hyperplasia (PA SH) at the 2:00 area of the left breast.  The patient's subsequent history is as detailed below.   PAST MEDICAL HISTORY: Past Medical History:  Diagnosis Date   Breast cancer ([place])    left breast   Wears contact lenses     PAST SURGICAL HISTORY: Past Surgical History:  Procedure Laterality Date   BILATERAL TOTAL MASTECTOMY WITH AXILLARY LYMPH NODE DISSECTION  04/20/2017   BREAST BIOPSY Left 02/18/2015   BREAST BIOPSY Right 02/18/2015   BREAST EXCISIONAL BIOPSY Left 04/05/2015   BREAST LUMPECTOMY Left 03/27/2015   BREAST LUMPECTOMY WITH NEEDLE LOCALIZATION AND AXILLARY SENTINEL LYMPH NODE BX Left 03/27/2015   Procedure: LEFT BREAST LUMPECTOMY WITH BRACKETED NEEDLE LOCALIZATION AND LEFT  AXILLARY SENTINEL LYMPH NODE BX;  Surgeon: [name], MD;  Location: [place];  Service: General;  Laterality: Left;   BREAST RECONSTRUCTION WITH PLACEMENT OF TISSUE EXPANDER AND FLEX HD (ACELLULAR HYDRATED DERMIS) Bilateral 04/20/2017   Procedure: BILATARAL BREAST RECONSTRUCTION WITH PLACEMENT OF TISSUE EXPANDER AND FLEX HD (ACELLULAR HYDRATED DERMIS);  Surgeon: Wallace Going, DO;  Location: [place];  Service: Plastics;  Laterality: Bilateral;   BREAST SURGERY     CHOLECYSTECTOMY N/A 07/07/2017   Procedure: LAPAROSCOPIC CHOLECYSTECTOMY WITH INTRAOPERATIVE CHOLANGIOGRAM;  Surgeon: [name], MD;  Location: [facility];  Service: General;  Laterality: N/A;   MASTECTOMY W/ SENTINEL NODE BIOPSY Bilateral 04/20/2017   Procedure: BILATERAL TOTAL MASTECTOMY WITH LEFT AXILLARY SENTINEL LYMPH  NODE BIOPSY;  Surgeon: [name], MD;  Location: [place];  Service: General;  Laterality: Bilateral;   PORTA CATH REMOVAL Right 11/04/2017   Procedure: PORTA CATH REMOVAL;  Surgeon: Wallace Going, DO;  Location: [place];  Service: Plastics;  Laterality: Right;   PORTACATH PLACEMENT Right 04/25/2015   Procedure: INSERTION PORT-A-CATH;  Surgeon: [name], MD;  Location: [place];  Service: General;  Laterality: Right;   PORTACATH PLACEMENT Right 04/20/2017   Procedure: INSERTION PORT-A-CATH;  Surgeon: [name], MD;  Location: [place];  Service: General;  Laterality: Right;   RE-EXCISION OF BREAST LUMPECTOMY Left 04/05/2015   Procedure: RE-EXCISION LEFT  BREAST LUMPECTOMY;  Surgeon: [name], MD;  Location: [place];  Service: General;  Laterality: Left;   REMOVAL OF TISSUE EXPANDER AND PLACEMENT OF IMPLANT Bilateral 11/04/2017   Procedure: BILATERAL REMOVAL OF TISSUE EXPANDER AND PLACEMENT OF  BILATERAL BREAST IMPLANT;  Surgeon: Wallace Going, DO;  Location: [place];  Service: Plastics;  Laterality: Bilateral;    FAMILY HISTORY Family History  Problem Relation Age of Onset   Lung cancer Maternal Grandmother 16       non smoker   Lung cancer Maternal Grandfather 29       non smoker worked in [place] Paternal Grandfather 35       throat cancer ? smoker   Cancer Cousin 41       throat cancer ? smoker   the patient's parents are living, in their mid-50s as of  March 2016. The patient had no siblings. Both the patient's mother's parents died from lung cancer although they did not smoke. [place] and her maternal grandfather was a Ecologist. There is no history of breast or ovarian cancer in the family to her knowledge  GYNECOLOGIC HISTORY:  Patient's last menstrual period was 04/02/2015. Menarche age 58, first live birth age 34. The  patient is GX P3. [name] having regular periods. She uses the Essure device for contraception.  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 Her first husband [name] is a Freight forwarder.  They divorced in 2016.  Their daughter [name] lives in [place] where she works in Press photographer. Daughter Summer is a Research scientist (physical sciences).  She lives independently.. Daughter [name] also at home is 60 years old.  Also at home is the patient's significant other, Tommye Standard, who does have children from a prior marriage but they are all grown.    ADVANCED DIRECTIVES: Not in place   HEALTH MAINTENANCE: Social History   Tobacco Use   Smoking status: Never Smoker   Smokeless tobacco: Never Used  Substance Use Topics   Alcohol use: Yes    Alcohol/week: 0.0 oz    Comment: occ   Drug use: No     Colonoscopy:  PAP:  Bone density:  Lipid panel:  Allergies  Allergen Reactions   Adhesive [Tape] Other (See Comments)    Skin blisters    Current Outpatient Medications  Medication Sig Dispense Refill   anastrozole (ARIMIDEX) 1 MG tablet Take 1 tablet (1 mg total) by mouth daily. 90 tablet 4   gabapentin (NEURONTIN) 300 MG capsule Take 1 capsule (300 mg total) by mouth at bedtime. (Patient not taking: Reported on 04/04/2018) 90 capsule 4   Vitamin D, Ergocalciferol, (DRISDOL) 50000 units CAPS capsule Take 1 capsule (50,000 Units total) by mouth every 7 (seven) days. 12 capsule 10   No current facility-administered medications for this visit.     OBJECTIVE:  Young white woman in no acute distress  Vitals:   04/15/18 1147  BP: (!) 135/91  Pulse: 80  Resp: 18  Temp: 98.4 F (36.9 C)  SpO2: 100%     Body mass index is 37.9 kg/m.    ECOG FS:1 - Symptomatic but completely ambulatory   Sclerae unicteric, pupils round and equal Oropharynx clear and moist No cervical or supraclavicular adenopathy Lungs no rales or rhonchi Heart regular rate and  rhythm Abd soft, obese, nontender, positive bowel sounds MSK no focal spinal tenderness, no upper extremity lymphedema Neuro: nonfocal, well oriented, appropriate affect Breasts: Status post bilateral mastectomies with bilateral reconstruction.  There is no evidence of chest wall recurrence.  Both axillae are benign.   LAB RESULTS:  CMP     Component Value Date/Time   NA 142 04/01/2018 0825   NA 140 10/21/2017 1206   K 4.6 04/01/2018 0825   K 4.0 10/21/2017 1206   CL 102 04/01/2018 0825   CO2 25 04/01/2018 0825   CO2 24 10/21/2017 1206   GLUCOSE 108 (H) 04/01/2018 0825   GLUCOSE 117 03/24/2018 1549   GLUCOSE 98 10/21/2017 1206   BUN 11 04/01/2018 0825   BUN 6.5 (L) 10/21/2017 1206   CREATININE 0.76 04/01/2018 0825   CREATININE 0.9 10/21/2017 1206   CALCIUM 9.8 04/01/2018 0825   CALCIUM 9.8 10/21/2017 1206   PROT 6.9 04/01/2018 0825   PROT 7.5 10/21/2017 1206  ALBUMIN 4.3 04/01/2018 0825   ALBUMIN 3.9 10/21/2017 1206   AST 19 04/01/2018 0825   AST 32 10/21/2017 1206   ALT 25 04/01/2018 0825   ALT 37 10/21/2017 1206   ALKPHOS 89 04/01/2018 0825   ALKPHOS 105 10/21/2017 1206   BILITOT 0.8 04/01/2018 0825   BILITOT 0.64 10/21/2017 1206   GFRNONAA 96 04/01/2018 0825   GFRAA 111 04/01/2018 0825    INo results found for: SPEP, UPEP  Lab Results  Component Value Date   WBC 5.7 04/01/2018   NEUTROABS 3.5 04/01/2018   HGB 12.2 04/01/2018   HCT 36.4 04/01/2018   MCV 87 04/01/2018   PLT 348 04/01/2018      Chemistry      Component Value Date/Time   NA 142 04/01/2018 0825   NA 140 10/21/2017 1206   K 4.6 04/01/2018 0825   K 4.0 10/21/2017 1206   CL 102 04/01/2018 0825   CO2 25 04/01/2018 0825   CO2 24 10/21/2017 1206   BUN 11 04/01/2018 0825   BUN 6.5 (L) 10/21/2017 1206   CREATININE 0.76 04/01/2018 0825   CREATININE 0.9 10/21/2017 1206      Component Value Date/Time   CALCIUM 9.8 04/01/2018 0825   CALCIUM 9.8 10/21/2017 1206   ALKPHOS 89 04/01/2018 0825    ALKPHOS 105 10/21/2017 1206   AST 19 04/01/2018 0825   AST 32 10/21/2017 1206   ALT 25 04/01/2018 0825   ALT 37 10/21/2017 1206   BILITOT 0.8 04/01/2018 0825   BILITOT 0.64 10/21/2017 1206       No results found for: LABCA2  No components found for: LABCA125  No results for input(s): INR in the last 168 hours.  Urinalysis    Component Value Date/Time   COLORURINE YELLOW (A) 07/24/2017 2158   APPEARANCEUR CLEAR (A) 07/24/2017 2158   LABSPEC 1.009 07/24/2017 2158   LABSPEC 1.020 05/30/2015 1502   PHURINE 7.0 07/24/2017 2158   GLUCOSEU NEGATIVE 07/24/2017 2158   GLUCOSEU Negative 05/30/2015 1502   HGBUR SMALL (A) 07/24/2017 2158   BILIRUBINUR NEGATIVE 07/24/2017 2158   BILIRUBINUR Negative 05/30/2015 1502   KETONESUR NEGATIVE 07/24/2017 2158   PROTEINUR NEGATIVE 07/24/2017 2158   UROBILINOGEN 0.2 05/30/2015 1502   NITRITE NEGATIVE 07/24/2017 2158   LEUKOCYTESUR NEGATIVE 07/24/2017 2158   LEUKOCYTESUR Negative 05/30/2015 1502    STUDIES: Dg Bone Density  Result Date: 03/21/2018 EXAM: DUAL X-RAY ABSORPTIOMETRY (DXA) FOR BONE MINERAL DENSITY IMPRESSION: Referring Physician:  [name] PATIENT: Name: Nasha, Diss Patient ID: [id number] Birth Date: [birth year] Height: 62.0 in. Sex: Female Measured: 03/21/2018 Weight: 208.9 lbs. Indications: Arimidex, Breast Cancer History, Caucasian, Estrogen Deficient, Postmenopausal Fractures: None ASSESSMENT: The BMD measured at AP Spine L2-L4 is 1.035 g/cm2 with a T-score of -1.4. This patient is considered osteopenic according to [facility]) criteria. This scan is considered good quality. L-1 was excluded due to degenerative changes. Site Region Measured Date Measured Age YA BMD Significant CHANGE T-score AP Spine  L2-L4      03/21/2018    42.1         -1.4    1.035 g/cm2 DualFemur Neck Right 03/21/2018    42.1         0.1     1.053 g/cm2 World Health Organization [facility]) criteria for post-menopausal, Caucasian  Women: Normal       T-score at or above -1 SD Osteopenia   T-score between -1 and -2.5 SD Osteoporosis T-score at or below -  2.5 SD RECOMMENDATION: National Osteoporosis Foundation recommends that FDA-approved medical therapies be considered in postmenopausal women and men age 52 or older with a: 1. Hip or vertebral (clinical or morphometric) fracture. 2. T-score of <-2.5 at the spine or hip. 3. Ten-year fracture probability by FRAX of 3% or greater for hip fracture or 20% or greater for major osteoporotic fracture. All treatment decisions require clinical judgment and consideration of individual patient factors, including patient preferences, co-morbidities, previous drug use, risk factors not captured in the FRAX model (e.g. falls, vitamin D deficiency, increased bone turnover, interval significant decline in bone density) and possible under - or over-estimation of fracture risk by FRAX. All patients should ensure an adequate intake of dietary calcium (1200 mg/d) and vitamin D (800 IU daily) unless contraindicated. FOLLOW-UP: People with diagnosed cases of osteoporosis or at high risk for fracture should have regular bone mineral density tests. For patients eligible for Medicare, routine testing is allowed once every 2 years. The testing frequency can be increased to one year for patients who have rapidly progressing disease, those who are receiving or discontinuing medical therapy to restore bone mass, or have additional risk factors. FRAX* 10-year Probability of Fracture Based on femoral neck BMD: DualFemur (Right) Major Osteoporotic Fracture: 1.9% Hip Fracture:                0.0% Population:                  [place] (Caucasian) Risk Factors:                None *FRAX is a Materials engineer of the [employer] of [employer] for Metabolic Bone Disease, a World Pharmacologist (WHO) [facility]. ASSESSMENT: The probability of a major osteoporotic fracture is 1.9 % within the next ten years.  The probability of hip fracture is  0.0  % within the next 10 years. Electronically Signed   By: Earle Gell M.D.   On: 03/21/2018 14:36    ASSESSMENT: 42 y.o. [place], [place] woman status post left breast upper outer quadrant biopsy 02/08/2015 for ductal carcinoma in situ, grade 2 or 3, strongly estrogen and progesterone receptor positive, with likely areas of microinvasion  (a) biopsy of an area in the left breast upper outer quadrant showed PASH  (b) biopsy of 2 additional questionable areas, one in each breast, showed bilateral fibroadenomas  (1) genetics testing March 2016 through the BreastNext gene panel offered by [employer] showed no deleterious mutations in ATM, BARD1, BRCA1, BRCA2, BRIP1, CDH1, CHEK2, MRE11A, MUTYH, NBN, NF1, PALB2, PTEN, RAD50, RAD51C, RAD51D, and TP53.  (2) status post left lumpectomy with sentinel lymph node sampling 03/27/2015 for a pT1c pN0, stage IA invasive ductal carcinoma, grade 3, triple negative, with an MIB-1 of 33%  (a) close margins were cleared with subsequent excision 04/05/2015.  (3) Oncotype DX score of 38 predicts a risk of 26% outside the breast recurrence within 10 years if the patient's only systemic treatment is tamoxifen for 5 years  (4) adjuvant chemotherapy started 05/02/2015 consisting of doxorubicin and cyclophosphamide in dose dense fashion 4, completed 06/13/2015, followed by paclitaxel weekly 12.   (a) Paclitaxel stopped after only 2 cycles because of persistent neuropathy.  (5) adjuvant radiation completed 11/08  (5) tamoxifen started 02/20/2015 (neoadjuvantly), discontinued 04/19/2015 so as not to overlap chemotherapy; resumed 02/07/2016, discontinued April 2018 with disease recurrence  RECURRENT DISEASE: April 2018 (6) left breast lower outer quadrant biopsy 03/05/2017 shows invasive ductal carcinoma, grade 2,  triple negative  (7) status post bilateral mastectomies and left axillary lymph node dissection 04/20/2017 showing a  residual left pT2 pN0 (8 nodes removed) invasive ductal carcinoma, grade 3, with negative margins; the right breast was benign  (a) she underwent expander exchange for bilateral gel implants with capsulectomies 11/04/2017  (8) chemotherapy for her recurrence consisting of cyclophosphamide, methotrexate and fluorouracil (CMF) given every 21 days 8, started 05/13/2017  (a) eighth cycle omitted because of intercurrent plastic surgery and complications  (9) status post cholecystectomy 07/07/2017, with benign pathology  (10) anastrozole started 12/27/2017  (a) bone density scan 03/21/2018 shows a T score of -1.4  PLAN  Tiani is now 1 year out from definitive surgery for her breast cancer with no evidence of disease recurrence.  This is favorable.  She continues on anastrozole for her original breast cancer, which was estrogen receptor positive.  The plan is to continue anastrozole a total of 5 years.  We discussed her bone density which showed a T score of -1.4.  This is favorable.  If she continues to exercise and replenishes her vitamin D as she is doing that should be stable or even improved.  [name] borderline diabetic and she understands she also has significant hepatic steatosis which can cause cirrhosis.  The treatment for both his diet and exercise and her primary care physician is working with her to get these problems taken care of.  Andee Poles is currently very motivated.  Since she is now divorced I gave her a healthcare power of attorney form to complete on notarized at her discretion  She will return to see me in 6 months.  At that time I will probably start seeing her on a once a year basis  She knows to call for any other issues that may develop before the next visit.   Magrinat, Virgie Dad, MD  04/15/18 11:57 AM Medical Oncology and Hematology [facility] [address] [place], [place] [address] Tel. [phone]    Fax. [phone]  This document  serves as a record of services personally performed by [name], MD. It was created on his behalf by Margit Banda, a trained medical scribe. The creation of this record is based on the scribe's personal observations and the provider's statements to them.   I have reviewed the above documentation for accuracy and completeness, and I agree with the above.

## 2018-04-15 ENCOUNTER — Inpatient Hospital Stay (HOSPITAL_BASED_OUTPATIENT_CLINIC_OR_DEPARTMENT_OTHER): Payer: 59 | Admitting: Oncology

## 2018-04-15 ENCOUNTER — Telehealth: Payer: Self-pay | Admitting: Oncology

## 2018-04-15 VITALS — BP 135/91 | HR 80 | Temp 98.4°F | Resp 18 | Ht 62.0 in | Wt 207.2 lb

## 2018-04-15 DIAGNOSIS — Z171 Estrogen receptor negative status [ER-]: Secondary | ICD-10-CM | POA: Diagnosis not present

## 2018-04-15 DIAGNOSIS — Z170 Estrogen receptor positive status [ER+]: Secondary | ICD-10-CM

## 2018-04-15 DIAGNOSIS — G620 Drug-induced polyneuropathy: Secondary | ICD-10-CM

## 2018-04-15 DIAGNOSIS — R7303 Prediabetes: Secondary | ICD-10-CM

## 2018-04-15 DIAGNOSIS — D0512 Intraductal carcinoma in situ of left breast: Secondary | ICD-10-CM

## 2018-04-15 DIAGNOSIS — C50512 Malignant neoplasm of lower-outer quadrant of left female breast: Secondary | ICD-10-CM

## 2018-04-15 DIAGNOSIS — K760 Fatty (change of) liver, not elsewhere classified: Secondary | ICD-10-CM

## 2018-04-15 DIAGNOSIS — C50912 Malignant neoplasm of unspecified site of left female breast: Secondary | ICD-10-CM

## 2018-04-15 DIAGNOSIS — C50412 Malignant neoplasm of upper-outer quadrant of left female breast: Secondary | ICD-10-CM | POA: Diagnosis not present

## 2018-04-15 DIAGNOSIS — T451X5A Adverse effect of antineoplastic and immunosuppressive drugs, initial encounter: Secondary | ICD-10-CM

## 2018-04-15 MED ORDER — VENLAFAXINE HCL ER 75 MG PO CP24: 75.0000 mg | ORAL_CAPSULE | Freq: Every day | ORAL | 4 refills | Status: DC

## 2018-04-15 NOTE — Telephone Encounter (Signed)
Per 5/24 los patient declined avs and calendar

## 2018-04-21 ENCOUNTER — Ambulatory Visit (INDEPENDENT_AMBULATORY_CARE_PROVIDER_SITE_OTHER): Payer: 59 | Admitting: Family Medicine

## 2018-04-21 ENCOUNTER — Encounter: Payer: Self-pay | Admitting: Family Medicine

## 2018-04-21 VITALS — BP 140/89 | HR 71 | Ht 62.0 in | Wt 208.0 lb

## 2018-04-21 DIAGNOSIS — R8271 Bacteriuria: Secondary | ICD-10-CM | POA: Diagnosis not present

## 2018-04-21 DIAGNOSIS — R319 Hematuria, unspecified: Secondary | ICD-10-CM

## 2018-04-21 DIAGNOSIS — R1084 Generalized abdominal pain: Secondary | ICD-10-CM

## 2018-04-21 DIAGNOSIS — R350 Frequency of micturition: Secondary | ICD-10-CM | POA: Diagnosis not present

## 2018-04-21 DIAGNOSIS — R8290 Unspecified abnormal findings in urine: Secondary | ICD-10-CM

## 2018-04-21 DIAGNOSIS — R109 Unspecified abdominal pain: Secondary | ICD-10-CM | POA: Diagnosis not present

## 2018-04-21 LAB — POCT URINALYSIS DIPSTICK
Bilirubin, UA: NEGATIVE
GLUCOSE UA: NEGATIVE
Ketones, UA: NEGATIVE
NITRITE UA: POSITIVE
PROTEIN UA: NEGATIVE
Urobilinogen, UA: 0.2 E.U./dL
pH, UA: 6 (ref 5.0–8.0)

## 2018-04-21 MED ORDER — PHENAZOPYRIDINE HCL 200 MG PO TABS: 200.0000 mg | ORAL_TABLET | Freq: Three times a day (TID) | ORAL | 0 refills | Status: AC | PRN

## 2018-04-21 MED ORDER — CIPROFLOXACIN HCL 500 MG PO TABS: 500.0000 mg | ORAL_TABLET | Freq: Two times a day (BID) | ORAL | 0 refills | Status: DC

## 2018-04-21 NOTE — Patient Instructions (Signed)
We told patient we will let her know what the urine culture shows irregardless.  Told her take 5 to 6 days before we call her.

## 2018-04-21 NOTE — Progress Notes (Signed)
Impression and Recommendations:    1. Generalized abdominal pain   2. Increased urinary frequency   3. Malodorous urine   4. Right flank pain   5. Bacteria in urine   6. Hematuria, unspecified type    1. Urinary Tract Infection  - Urine sample sent out for culture today and further evaluation.  Will call the patient with results.  - Discussed with the patient that worsening pain, fever, chills, and blood while urination could indicate the presence of kidney stones.  Reviewed signs and symptoms of kidney stones with the patient, and the fact that this can be associated with UTI.  - Ciprofloxacin prescribed today to treat for potentially ascending infection.   Education and routine counseling performed. Handouts provided.  2. Follow-Up - Patient knows to monitor for the possibility of kidney stones as she treats her UTI with antibiotics.  - She knows to call in if no improvement or new and worsening symptoms arise.  - In the meantime, advised patient to keep an eye on her blood pressure at home between now and her next scheduled chronic follow-up.   Orders Placed This Encounter  Procedures  . Urine Culture  . POCT urinalysis dipstick    Meds ordered this encounter  Medications  . ciprofloxacin (CIPRO) 500 MG tablet    Sig: Take 1 tablet (500 mg total) by mouth 2 (two) times daily.    Dispense:  10 tablet    Refill:  0  . phenazopyridine (PYRIDIUM) 200 MG tablet    Sig: Take 1 tablet (200 mg total) by mouth 3 (three) times daily as needed for up to 2 days for pain.    Dispense:  15 tablet    Refill:  0    The patient was counseled, risk factors were discussed, anticipatory guidance given.  Gross side effects, risk and benefits, and alternatives of medications discussed with patient.  Patient is aware that all medications have potential side effects and we are unable to predict every side effect or drug-drug interaction that may occur.  Expresses verbal  understanding and consents to current therapy plan and treatment regimen.   Return if symptoms worsen or fail to improve, for I told patient to follow-up for chronic care as previously discussed.  This document serves as a record of services personally performed by Mellody Dance, DO. It was created on her behalf by Toni Amend, a trained medical scribe. The creation of this record is based on the scribe's personal observations and the provider's statements to them.   I have reviewed the above medical documentation for accuracy and completeness and I concur.  Mellody Dance 04/28/18 12:11 PM     Subjective:    HPI: Cheryl [name] is a 42 y.o. female who presents to [place] at [facility] today for c/o suspected urinary tract infection.  Sx for at least a couple of weeks, maybe longer.     C/O: Stomach has been hurting for at least a couple of weeks, maybe longer.   Initially thought it was maybe because of medicine she was taking.  Yesterday, she noticed her urine smelled strong and suspected a UTI.  Along with her malodorous urine, patient notes increased urinary frequency and bilateral lower abdominal pain.  Notes pain moreso on the right.  Has right-sided back pain.    Has occasional nausea.  Noticed that she's more tired than usual.  Has not been having more sex than usual;  remarks "not having much of that at all."  Denies: Denies fever or chills.  Denies vomiting.  Denies burrning while urination.  Denies blood in her urine.   Urinalysis    Component Value Date/Time   COLORURINE YELLOW (A) 07/24/2017 2158   APPEARANCEUR CLEAR (A) 07/24/2017 2158   LABSPEC 1.009 07/24/2017 2158   LABSPEC 1.020 05/30/2015 1502   PHURINE 7.0 07/24/2017 2158   GLUCOSEU NEGATIVE 07/24/2017 2158   GLUCOSEU Negative 05/30/2015 1502   HGBUR SMALL (A) 07/24/2017 2158   BILIRUBINUR negative 04/26/2018 1144   BILIRUBINUR Negative 05/30/2015 1502    KETONESUR NEGATIVE 07/24/2017 2158   PROTEINUR Negative 04/26/2018 1144   PROTEINUR NEGATIVE 07/24/2017 2158   UROBILINOGEN 0.2 04/26/2018 1144   UROBILINOGEN 0.2 05/30/2015 1502   NITRITE negative 04/26/2018 1144   NITRITE NEGATIVE 07/24/2017 2158   LEUKOCYTESUR Negative 04/26/2018 1144   LEUKOCYTESUR Negative 05/30/2015 1502    Wt Readings from Last 3 Encounters:  04/26/18 207 lb (93.9 kg)  04/21/18 208 lb (94.3 kg)  04/15/18 207 lb 3.2 oz (94 kg)   BP Readings from Last 3 Encounters:  04/26/18 (!) 148/99  04/21/18 140/89  04/15/18 (!) 135/91   Pulse Readings from Last 3 Encounters:  04/26/18 87  04/21/18 71  04/15/18 80   BMI Readings from Last 3 Encounters:  04/26/18 37.86 kg/m  04/21/18 38.04 kg/m  04/15/18 37.90 kg/m     Patient Active Problem List   Diagnosis Date Noted  . Glucose intolerance (impaired glucose tolerance) 04/04/2018  . Elevated LDL cholesterol level-  10-year risk less than 2% 5/19 04/04/2018  . Hypertriglyceridemia 04/04/2018  . Morbid obesity ([place]) 04/04/2018  . Vitamin D insufficiency 04/04/2018  . Health education/counseling 04/04/2018  . Family history of diabetes mellitus in mother-   in her mid 22s 04/04/2018  . Neuropathy 03/13/2018  . History of therapeutic radiation- 30+ txmnts 03/03/2018  . Port-A-Cath in place 09/09/2017  . Focal nodular hyperplasia of liver by MRI  07/26/2017  . Acquired absence of bilateral breasts and nipples 04/27/2017  . Recurrent breast cancer, left ([place]) 03/18/2017  . Genetic testing 03/05/2016  . Chemotherapy-induced neuropathy ([place]) 07/11/2015  . Bronchitis 06/13/2015  . Flank pain 05/30/2015  . UTI (urinary tract infection) 05/16/2015  . Malignant neoplasm of upper-outer quadrant of left breast in female, estrogen receptor negative ([place]) 02/13/2015    Past Surgical History:  Procedure Laterality Date  . BILATERAL TOTAL MASTECTOMY WITH AXILLARY LYMPH NODE DISSECTION  04/20/2017  . BREAST BIOPSY  Left 02/18/2015  . BREAST BIOPSY Right 02/18/2015  . BREAST EXCISIONAL BIOPSY Left 04/05/2015  . BREAST LUMPECTOMY Left 03/27/2015  . BREAST LUMPECTOMY WITH NEEDLE LOCALIZATION AND AXILLARY SENTINEL LYMPH NODE BX Left 03/27/2015   Procedure: LEFT BREAST LUMPECTOMY WITH BRACKETED NEEDLE LOCALIZATION AND LEFT  AXILLARY SENTINEL LYMPH NODE BX;  Surgeon: [name], MD;  Location: [place];  Service: General;  Laterality: Left;  . BREAST RECONSTRUCTION WITH PLACEMENT OF TISSUE EXPANDER AND FLEX HD (ACELLULAR HYDRATED DERMIS) Bilateral 04/20/2017   Procedure: BILATARAL BREAST RECONSTRUCTION WITH PLACEMENT OF TISSUE EXPANDER AND FLEX HD (ACELLULAR HYDRATED DERMIS);  Surgeon: Wallace Going, DO;  Location: [place];  Service: Plastics;  Laterality: Bilateral;  . BREAST SURGERY    . CHOLECYSTECTOMY N/A 07/07/2017   Procedure: LAPAROSCOPIC CHOLECYSTECTOMY WITH INTRAOPERATIVE CHOLANGIOGRAM;  Surgeon: [name], MD;  Location: [facility];  Service: General;  Laterality: N/A;  . MASTECTOMY W/ SENTINEL NODE BIOPSY Bilateral 04/20/2017   Procedure: BILATERAL TOTAL  MASTECTOMY WITH LEFT AXILLARY SENTINEL LYMPH NODE BIOPSY;  Surgeon: [name], MD;  Location: [place];  Service: General;  Laterality: Bilateral;  . PORTA CATH REMOVAL Right 11/04/2017   Procedure: PORTA CATH REMOVAL;  Surgeon: Wallace Going, DO;  Location: [place];  Service: Plastics;  Laterality: Right;  . PORTACATH PLACEMENT Right 04/25/2015   Procedure: INSERTION PORT-A-CATH;  Surgeon: [name], MD;  Location: [place];  Service: General;  Laterality: Right;  . PORTACATH PLACEMENT Right 04/20/2017   Procedure: INSERTION PORT-A-CATH;  Surgeon: [name], MD;  Location: [place];  Service: General;  Laterality: Right;  . RE-EXCISION OF BREAST LUMPECTOMY Left 04/05/2015   Procedure: RE-EXCISION LEFT  BREAST LUMPECTOMY;  Surgeon: [name], MD;  Location:  [place];  Service: General;  Laterality: Left;  . REMOVAL OF TISSUE EXPANDER AND PLACEMENT OF IMPLANT Bilateral 11/04/2017   Procedure: BILATERAL REMOVAL OF TISSUE EXPANDER AND PLACEMENT OF  BILATERAL BREAST IMPLANT;  Surgeon: Wallace Going, DO;  Location: [place];  Service: Plastics;  Laterality: Bilateral;    Family History  Problem Relation Age of Onset  . Lung cancer Maternal Grandmother 12       non smoker  . Lung cancer Maternal Grandfather 61       non smoker worked in [employer]  . Cancer Paternal Grandfather 35       throat cancer ? smoker  . Cancer Cousin 41       throat cancer ? smoker    Social History   Substance and Sexual Activity  Drug Use No  ,  Social History   Substance and Sexual Activity  Alcohol Use Yes  . Alcohol/week: 0.0 oz   Comment: occ  ,  Social History   Tobacco Use  Smoking Status Never Smoker  Smokeless Tobacco Never Used  ,  Social History   Substance and Sexual Activity  Sexual Activity Yes    Patient's Medications  New Prescriptions   CIPROFLOXACIN (CIPRO) 500 MG TABLET    Take 1 tablet (500 mg total) by mouth 2 (two) times daily.  Previous Medications   ANASTROZOLE (ARIMIDEX) 1 MG TABLET    Take 1 tablet (1 mg total) by mouth daily.   VENLAFAXINE XR (EFFEXOR-XR) 75 MG 24 HR CAPSULE    Take 1 capsule (75 mg total) by mouth daily with breakfast.   VITAMIN D, ERGOCALCIFEROL, (DRISDOL) 50000 UNITS CAPS CAPSULE    Take 1 capsule (50,000 Units total) by mouth every 7 (seven) days.  Modified Medications   No medications on file  Discontinued Medications   No medications on file    Adhesive [tape]  Current Meds  Medication Sig  . anastrozole (ARIMIDEX) 1 MG tablet Take 1 tablet (1 mg total) by mouth daily.  Marland Kitchen venlafaxine XR (EFFEXOR-XR) 75 MG 24 hr capsule Take 1 capsule (75 mg total) by mouth daily with breakfast.  . Vitamin D, Ergocalciferol, (DRISDOL) 50000 units CAPS capsule Take 1  capsule (50,000 Units total) by mouth every 7 (seven) days.    Review of Systems: General:   No F/C, wt loss Pulm:   No DIB, pleuritic chest pain Card:  No CP, palpitations Abd:  No n/v/d or pain GU:  Dysuria, increased frequency and urgency; no vaginal discharge Ext:  No inc edema from baseline   Objective:  Blood pressure 140/89, pulse 71, height 5\' 2"  (1.575 m), weight 208 lb (94.3 kg), last menstrual period 04/02/2015, SpO2 98 %.  Body mass index is 38.04 kg/m.  General: Well Developed, well nourished, and in no acute distress.  HEENT: Normocephalic, atraumatic Skin: Warm and dry, cap RF less 2 sec, good turgor CV: +S1, S2 Respiratory: ECTA B/L; speaking in full sentences, no conversational dyspnea Abd: Soft, NT, ND, No G/R/R, no SPT, No flank pain NeuroM-Sk: Ambulates w/o assistance, moves * 4 Psych: A and O *3 Abdominal: Tenderness to palpation superpubic region and right lower quadrant; mild flank tenderness to costophrenic angle punch on the right.

## 2018-04-25 ENCOUNTER — Telehealth: Payer: Self-pay | Admitting: Family Medicine

## 2018-04-25 LAB — URINE CULTURE

## 2018-04-25 NOTE — Telephone Encounter (Signed)
Patient was seen on 04/21/2018 and started on Cipro.  Patient is still having symptoms.  Urine culture results are in the chart.  Please advise. MPulliam, CMA/RT(R)

## 2018-04-25 NOTE — Telephone Encounter (Signed)
Called the patient unable to get in touch with her. MPulliam, CMA/RT(R)

## 2018-04-25 NOTE — Telephone Encounter (Signed)
Please call patient and let her know that her urine culture showed ciprofloxacin was the proper antibiotic for the E. coli bacteria in her urine.  For a typical UTI like she had, 3 days of symptoms as the usual treatment and she received 5 days.  I recommend that she give it a few more days and then come in for an office visit and recheck of her urine if her symptoms are not improved by this Thursday.    Please reassure patient that without the urine culture we would not know for sure that the antibiotic we gave her was the proper one but now we have confirmation that it was.

## 2018-04-25 NOTE — Telephone Encounter (Signed)
Patient was seen last week for UTI, patient states she is not feeling any better and is on her last day of antibiotics and wants to know what to do. Please advise

## 2018-04-26 ENCOUNTER — Encounter: Payer: Self-pay | Admitting: Family Medicine

## 2018-04-26 ENCOUNTER — Ambulatory Visit
Admission: RE | Admit: 2018-04-26 | Discharge: 2018-04-26 | Disposition: A | Discharge status: Home / Self Care | Payer: 59 | Source: Ambulatory Visit | Attending: Family Medicine | Admitting: Family Medicine

## 2018-04-26 ENCOUNTER — Ambulatory Visit (INDEPENDENT_AMBULATORY_CARE_PROVIDER_SITE_OTHER): Payer: 59 | Admitting: Family Medicine

## 2018-04-26 VITALS — BP 148/99 | HR 87 | Ht 62.0 in | Wt 207.0 lb

## 2018-04-26 DIAGNOSIS — R1031 Right lower quadrant pain: Secondary | ICD-10-CM

## 2018-04-26 DIAGNOSIS — R1084 Generalized abdominal pain: Secondary | ICD-10-CM | POA: Diagnosis not present

## 2018-04-26 DIAGNOSIS — R112 Nausea with vomiting, unspecified: Secondary | ICD-10-CM

## 2018-04-26 DIAGNOSIS — K439 Ventral hernia without obstruction or gangrene: Secondary | ICD-10-CM | POA: Diagnosis not present

## 2018-04-26 DIAGNOSIS — R319 Hematuria, unspecified: Secondary | ICD-10-CM

## 2018-04-26 DIAGNOSIS — R110 Nausea: Secondary | ICD-10-CM | POA: Diagnosis not present

## 2018-04-26 DIAGNOSIS — R1011 Right upper quadrant pain: Secondary | ICD-10-CM

## 2018-04-26 DIAGNOSIS — R350 Frequency of micturition: Secondary | ICD-10-CM

## 2018-04-26 LAB — POCT URINALYSIS DIPSTICK
Bilirubin, UA: NEGATIVE
Glucose, UA: NEGATIVE
Ketones, UA: NEGATIVE
LEUKOCYTES UA: NEGATIVE
NITRITE UA: NEGATIVE
PH UA: 7 (ref 5.0–8.0)
PROTEIN UA: NEGATIVE
SPEC GRAV UA: 1.02 (ref 1.010–1.025)
UROBILINOGEN UA: 0.2 U/dL

## 2018-04-26 LAB — POCT URINE PREGNANCY: PREG TEST UR: NEGATIVE

## 2018-04-26 NOTE — Patient Instructions (Signed)
We are sending you today for a CT scan of your abdomen and pelvis.  I am concerned about a kidney\ureteral stone as well as want to rule out possible appendicitis and/or ovarian pathology.

## 2018-04-26 NOTE — Telephone Encounter (Signed)
Patient came in for a follow up visit today. MPulliam, CMA/RT(R)

## 2018-04-26 NOTE — Progress Notes (Signed)
Impression and Recommendations:    1. Hematuria, unspecified type   2. Generalized abdominal pain   3. Increased urinary frequency   4. Nausea   5. Right upper quadrant abdominal pain   6. Right lower quadrant abdominal pain   7. Non-intractable vomiting with nausea, unspecified vomiting type    1. Unspecified Hematuria & Generalized Abdominal Pain - Reviewed possible causes for patient's pain, including appendicitis, ovarian cyst, and kidney stone.  - CT scan of abdomen and pelvis, including CT stone study, recommended today to visualize her appendix, ovaries, and kidney structures.  - At home, encouraged patient to monitor her temperature for fever.  - Patient will assess whether her nausea occurs after eating, or is associated with different activities.  - Advised patient that if a kidney stone is found, she will need to treat this with pain meds, hydration, and the possible addition of Flomax for urinary stream assistance.  Per patient, notes that she can take any type of pain medicine.  - Advised patient that if a stone is found, future evaluation may be needed through urology.   Education and routine counseling performed. Handouts provided.  Orders Placed This Encounter  Procedures  . CT Abdomen Pelvis Wo Contrast  . CBC with Differential/Platelet  . Comprehensive metabolic panel  . POCT urinalysis dipstick  . POCT urine pregnancy    No orders of the defined types were placed in this encounter.   The patient was counseled, risk factors were discussed, anticipatory guidance given.  Gross side effects, risk and benefits, and alternatives of medications discussed with patient.  Patient is aware that all medications have potential side effects and we are unable to predict every side effect or drug-drug interaction that may occur.  Expresses verbal understanding and consents to current therapy plan and treatment regimen.   Return if symptoms worsen or fail to  improve, for Further follow-up will be determined after CT results.  This document serves as a record of services personally performed by Mellody Dance, DO. It was created on her behalf by Toni Amend, a trained medical scribe. The creation of this record is based on the scribe's personal observations and the provider's statements to them.   I have reviewed the above medical documentation for accuracy and completeness and I concur.  Mellody Dance 05/01/18 9:30 PM      Subjective:    HPI: Cheryl [name] is a 42 y.o. female who presents to [place] at [facility] today for c/o worsening back pain post-UTI.  Patient has her appendix and ovaries, but notes no possibility of pregnancy since she had a tubal ligation.  Patient took the antibiotics exactly as prescribed since last appointment 5 days ago (04/21/2018).   Notes that she felt somewhat better the first couple of days, but then her symptoms worsened.  Patient has never had a kidney stone before in the past  Sx for the past 3-4 days+, following evaluation for UTI 5 days ago.  C/O: This morning, she felt like she really had to pee but nothing came out.  She also experienced chills today, but is unsure if she had a fever.  States that she is hurting the same place as before, but worse; now hurting moreso in the lower back.  Patient also noticed feeling more nauseous over the weekend, but wasn't sure if this was due to antibiotics.  States that her nausea is very random, occurring mainly in the afternoon,  not particularly associated with anything.  Denies: Patient denies RUQ pain or abdominal pain after fatty meals.  Patient's gallbladder is removed.  Patient denies vaginal discharge. Patient denies blood in her urine. Patient denies vomiting. Patient denies fever.   Urinalysis    Component Value Date/Time   COLORURINE YELLOW (A) 07/24/2017 2158   APPEARANCEUR CLEAR (A) 07/24/2017 2158    LABSPEC 1.009 07/24/2017 2158   LABSPEC 1.020 05/30/2015 1502   PHURINE 7.0 07/24/2017 2158   GLUCOSEU NEGATIVE 07/24/2017 2158   GLUCOSEU Negative 05/30/2015 1502   HGBUR SMALL (A) 07/24/2017 2158   BILIRUBINUR negative 04/26/2018 1144   BILIRUBINUR Negative 05/30/2015 1502   KETONESUR NEGATIVE 07/24/2017 2158   PROTEINUR Negative 04/26/2018 1144   PROTEINUR NEGATIVE 07/24/2017 2158   UROBILINOGEN 0.2 04/26/2018 1144   UROBILINOGEN 0.2 05/30/2015 1502   NITRITE negative 04/26/2018 1144   NITRITE NEGATIVE 07/24/2017 2158   LEUKOCYTESUR Negative 04/26/2018 1144   LEUKOCYTESUR Negative 05/30/2015 1502    Wt Readings from Last 3 Encounters:  04/26/18 207 lb (93.9 kg)  04/21/18 208 lb (94.3 kg)  04/15/18 207 lb 3.2 oz (94 kg)   BP Readings from Last 3 Encounters:  04/26/18 (!) 148/99  04/21/18 140/89  04/15/18 (!) 135/91   Pulse Readings from Last 3 Encounters:  04/26/18 87  04/21/18 71  04/15/18 80   BMI Readings from Last 3 Encounters:  04/26/18 37.86 kg/m  04/21/18 38.04 kg/m  04/15/18 37.90 kg/m     Patient Active Problem List   Diagnosis Date Noted  . Glucose intolerance (impaired glucose tolerance) 04/04/2018  . Elevated LDL cholesterol level-  10-year risk less than 2% 5/19 04/04/2018  . Hypertriglyceridemia 04/04/2018  . Morbid obesity ([place]) 04/04/2018  . Vitamin D insufficiency 04/04/2018  . Health education/counseling 04/04/2018  . Family history of diabetes mellitus in mother-   in her mid 40s 04/04/2018  . Neuropathy 03/13/2018  . History of therapeutic radiation- 30+ txmnts 03/03/2018  . Port-A-Cath in place 09/09/2017  . Focal nodular hyperplasia of liver by MRI  07/26/2017  . Acquired absence of bilateral breasts and nipples 04/27/2017  . Recurrent breast cancer, left ([place]) 03/18/2017  . Genetic testing 03/05/2016  . Chemotherapy-induced neuropathy ([place]) 07/11/2015  . Bronchitis 06/13/2015  . Flank pain 05/30/2015  . UTI (urinary tract  infection) 05/16/2015  . Malignant neoplasm of upper-outer quadrant of left breast in female, estrogen receptor negative ([place]) 02/13/2015    Past Surgical History:  Procedure Laterality Date  . BILATERAL TOTAL MASTECTOMY WITH AXILLARY LYMPH NODE DISSECTION  04/20/2017  . BREAST BIOPSY Left 02/18/2015  . BREAST BIOPSY Right 02/18/2015  . BREAST EXCISIONAL BIOPSY Left 04/05/2015  . BREAST LUMPECTOMY Left 03/27/2015  . BREAST LUMPECTOMY WITH NEEDLE LOCALIZATION AND AXILLARY SENTINEL LYMPH NODE BX Left 03/27/2015   Procedure: LEFT BREAST LUMPECTOMY WITH BRACKETED NEEDLE LOCALIZATION AND LEFT  AXILLARY SENTINEL LYMPH NODE BX;  Surgeon: [name], MD;  Location: [place];  Service: General;  Laterality: Left;  . BREAST RECONSTRUCTION WITH PLACEMENT OF TISSUE EXPANDER AND FLEX HD (ACELLULAR HYDRATED DERMIS) Bilateral 04/20/2017   Procedure: BILATARAL BREAST RECONSTRUCTION WITH PLACEMENT OF TISSUE EXPANDER AND FLEX HD (ACELLULAR HYDRATED DERMIS);  Surgeon: Wallace Going, DO;  Location: [place];  Service: Plastics;  Laterality: Bilateral;  . BREAST SURGERY    . CHOLECYSTECTOMY N/A 07/07/2017   Procedure: LAPAROSCOPIC CHOLECYSTECTOMY WITH INTRAOPERATIVE CHOLANGIOGRAM;  Surgeon: [name], MD;  [facility];  Service: General;  Laterality: N/A;  . MASTECTOMY  W/ SENTINEL NODE BIOPSY Bilateral 04/20/2017   Procedure: BILATERAL TOTAL MASTECTOMY WITH LEFT AXILLARY SENTINEL LYMPH NODE BIOPSY;  Surgeon: [name], MD;  Location: [place];  Service: General;  Laterality: Bilateral;  . PORTA CATH REMOVAL Right 11/04/2017   Procedure: PORTA CATH REMOVAL;  Surgeon: Wallace Going, DO;  Location: [place];  Service: Plastics;  Laterality: Right;  . PORTACATH PLACEMENT Right 04/25/2015   Procedure: INSERTION PORT-A-CATH;  Surgeon: [name], MD;  Location: [place];  Service: General;  Laterality: Right;  . PORTACATH PLACEMENT  Right 04/20/2017   Procedure: INSERTION PORT-A-CATH;  Surgeon: [name], MD;  Location: [place];  Service: General;  Laterality: Right;  . RE-EXCISION OF BREAST LUMPECTOMY Left 04/05/2015   Procedure: RE-EXCISION LEFT  BREAST LUMPECTOMY;  Surgeon: [name], MD;  Location: [place];  Service: General;  Laterality: Left;  . REMOVAL OF TISSUE EXPANDER AND PLACEMENT OF IMPLANT Bilateral 11/04/2017   Procedure: BILATERAL REMOVAL OF TISSUE EXPANDER AND PLACEMENT OF  BILATERAL BREAST IMPLANT;  Surgeon: Wallace Going, DO;  Location: [place];  Service: Plastics;  Laterality: Bilateral;    Family History  Problem Relation Age of Onset  . Lung cancer Maternal Grandmother 30       non smoker  . Lung cancer Maternal Grandfather 33       non smoker worked in [employer]  . Cancer Paternal Grandfather 35       throat cancer ? smoker  . Cancer Cousin 41       throat cancer ? smoker    Social History   Substance and Sexual Activity  Drug Use No  ,  Social History   Substance and Sexual Activity  Alcohol Use Yes  . Alcohol/week: 0.0 oz   Comment: occ  ,  Social History   Tobacco Use  Smoking Status Never Smoker  Smokeless Tobacco Never Used  ,  Social History   Substance and Sexual Activity  Sexual Activity Yes    Patient's Medications  New Prescriptions   No medications on file  Previous Medications   ANASTROZOLE (ARIMIDEX) 1 MG TABLET    Take 1 tablet (1 mg total) by mouth daily.   CIPROFLOXACIN (CIPRO) 500 MG TABLET    Take 1 tablet (500 mg total) by mouth 2 (two) times daily.   VENLAFAXINE XR (EFFEXOR-XR) 75 MG 24 HR CAPSULE    Take 1 capsule (75 mg total) by mouth daily with breakfast.   VITAMIN D, ERGOCALCIFEROL, (DRISDOL) 50000 UNITS CAPS CAPSULE    Take 1 capsule (50,000 Units total) by mouth every 7 (seven) days.  Modified Medications   No medications on file  Discontinued Medications   No medications on file     Adhesive [tape]  Current Meds  Medication Sig  . anastrozole (ARIMIDEX) 1 MG tablet Take 1 tablet (1 mg total) by mouth daily.  Marland Kitchen venlafaxine XR (EFFEXOR-XR) 75 MG 24 hr capsule Take 1 capsule (75 mg total) by mouth daily with breakfast.  . Vitamin D, Ergocalciferol, (DRISDOL) 50000 units CAPS capsule Take 1 capsule (50,000 Units total) by mouth every 7 (seven) days.    Review of Systems: General:   No F/C, wt loss Pulm:   No DIB, pleuritic chest pain Card:  No CP, palpitations Abd:  No n/v/d or pain GU:  Dysuria, increased frequency and urgency; no vaginal discharge Ext:  No inc edema from baseline   Objective:  Blood pressure (!) 148/99,  pulse 87, height 5\' 2"  (1.575 m), weight 207 lb (93.9 kg), last menstrual period 04/02/2015, SpO2 98 %. Body mass index is 37.86 kg/m.  General: Well Developed, well nourished, and in no acute distress.  HEENT: Normocephalic, atraumatic Skin: Warm and dry, cap RF less 2 sec, good turgor CV: +S1, S2 Respiratory: ECTA B/L; speaking in full sentences, no conversational dyspnea Abd: Rebound tenderness and guarding RLQ, bowel sounds x4, hypoactive.  Significant tenderness to right flank.  Soft, ND, no SPT. NeuroM-Sk: Ambulates w/o assistance, moves * 4 Psych: A and O *3

## 2018-04-27 LAB — COMPREHENSIVE METABOLIC PANEL
A/G RATIO: 1.9 (ref 1.2–2.2)
ALT: 30 IU/L (ref 0–32)
AST: 27 IU/L (ref 0–40)
Albumin: 4.8 g/dL (ref 3.5–5.5)
Alkaline Phosphatase: 98 IU/L (ref 39–117)
BILIRUBIN TOTAL: 0.9 mg/dL (ref 0.0–1.2)
BUN/Creatinine Ratio: 13 (ref 9–23)
BUN: 10 mg/dL (ref 6–24)
CALCIUM: 9.9 mg/dL (ref 8.7–10.2)
CHLORIDE: 98 mmol/L (ref 96–106)
CO2: 23 mmol/L (ref 20–29)
Creatinine, Ser: 0.78 mg/dL (ref 0.57–1.00)
GFR, EST AFRICAN AMERICAN: 108 mL/min/{1.73_m2} (ref >59)
GFR, EST NON AFRICAN AMERICAN: 93 mL/min/{1.73_m2} (ref >59)
GLOBULIN, TOTAL: 2.5 g/dL (ref 1.5–4.5)
Glucose: 94 mg/dL (ref 65–99)
POTASSIUM: 4 mmol/L (ref 3.5–5.2)
SODIUM: 138 mmol/L (ref 134–144)
TOTAL PROTEIN: 7.3 g/dL (ref 6.0–8.5)

## 2018-04-27 LAB — CBC WITH DIFFERENTIAL/PLATELET
BASOS: 1 %
Basophils Absolute: 0 10*3/uL (ref 0.0–0.2)
EOS (ABSOLUTE): 0.2 10*3/uL (ref 0.0–0.4)
EOS: 2 %
HEMATOCRIT: 39.5 % (ref 34.0–46.6)
Hemoglobin: 13.2 g/dL (ref 11.1–15.9)
IMMATURE GRANULOCYTES: 0 %
Immature Grans (Abs): 0 10*3/uL (ref 0.0–0.1)
Lymphocytes Absolute: 1.8 10*3/uL (ref 0.7–3.1)
Lymphs: 28 %
MCH: 29.1 pg (ref 26.6–33.0)
MCHC: 33.4 g/dL (ref 31.5–35.7)
MCV: 87 fL (ref 79–97)
MONOS ABS: 0.4 10*3/uL (ref 0.1–0.9)
Monocytes: 6 %
NEUTROS ABS: 4.2 10*3/uL (ref 1.4–7.0)
Neutrophils: 63 %
PLATELETS: 358 10*3/uL (ref 150–450)
RBC: 4.53 x10E6/uL (ref 3.77–5.28)
RDW: 15.1 % (ref 12.3–15.4)
WBC: 6.5 10*3/uL (ref 3.4–10.8)

## 2018-04-28 DIAGNOSIS — R102 Pelvic and perineal pain: Secondary | ICD-10-CM | POA: Diagnosis not present

## 2018-04-29 ENCOUNTER — Telehealth: Payer: Self-pay | Admitting: *Deleted

## 2018-04-29 ENCOUNTER — Encounter: Payer: Self-pay | Admitting: Adult Health

## 2018-04-29 NOTE — Telephone Encounter (Signed)
This RN obtained VM from pt stating " I am having major stomach pains ".  Included in message - Cheryl [name] states she was seen by her primary MD - had UTI " but that was treated already" - she also states she was seen yesterday at her GYN with work up per above.  " I was hoping Dr Jana Hakim could order an ultrasound to be done today to look at my ovaries and uterus "  Return call number given as [phone].  This RN returned call at 1:20 pm and obtained number identified VM.  Message left requesting a return call per above - concern due to need for further work up for MD to order testing- that may not be able to be done the same day.  Recommended if she needed urgent care she should see her primary, an urgent care or proceed to the ER.  This RN's name given for return call.  Note pt has recent labs and CT that was obtained relating to above concerns. No noted abnormalities on non contrast CT and CMET results in normal ranges.  Will await return call.

## 2018-05-02 ENCOUNTER — Encounter: Payer: Self-pay | Admitting: Adult Health

## 2018-05-03 ENCOUNTER — Ambulatory Visit: Payer: Self-pay | Admitting: Plastic Surgery

## 2018-05-03 ENCOUNTER — Other Ambulatory Visit: Payer: Self-pay | Admitting: Adult Health

## 2018-05-03 ENCOUNTER — Telehealth: Payer: Self-pay

## 2018-05-03 ENCOUNTER — Inpatient Hospital Stay: Payer: 59 | Attending: Oncology | Admitting: Medical

## 2018-05-03 VITALS — BP 148/89 | HR 84 | Temp 98.6°F | Resp 18 | Ht 62.0 in | Wt 208.0 lb

## 2018-05-03 DIAGNOSIS — R102 Pelvic and perineal pain: Secondary | ICD-10-CM

## 2018-05-03 DIAGNOSIS — Z9013 Acquired absence of bilateral breasts and nipples: Secondary | ICD-10-CM

## 2018-05-03 DIAGNOSIS — N651 Disproportion of reconstructed breast: Secondary | ICD-10-CM

## 2018-05-03 DIAGNOSIS — R109 Unspecified abdominal pain: Secondary | ICD-10-CM

## 2018-05-03 MED ORDER — HYOSCYAMINE SULFATE 0.125 MG PO TABS
0.1250 mg | ORAL_TABLET | ORAL | 0 refills | Status: DC | PRN
Start: 2018-05-03 — End: 2018-05-13

## 2018-05-03 NOTE — H&P (View-Only) (Signed)
Cheryl [name] is an 42 y.o. female.   Chief Complaint: acquired absence of breast HPI: The patient is a 42 yrs old wf here for follow up on her breast reconstruction.  She now has the implants in place and is pleased with the progress.  She had redness on the left breast (radiated side) after the implants were placed.  This has completely resolved.  There is firmness on the right medial and lateral aspect.  There is no sign of infection or seroma.  History: She had two lumpectomies on the LEFT breast in 2016 followed by radiation.  Then a new triple negative invasive ductal carcinoma of the lower inner left breast. Screening mammography 01/2015 showed calcifications in the upper outer quadrant of the LEFT breast, ductal carcinoma in situ, ER/PR positive tumor. Bilateral breast MRI confirmed an upper outer quadrant lesion.  A RIGHT breast lesion was found and biopsy showed a fibroadenoma.  The patient is 5 feet 2 inches tall, weight is 212 pounds. Preop bra= 38DD.  The NAC are grade 2 ptosis   Past Medical History:  Diagnosis Date  . Breast cancer ([place])    left breast  . Wears contact lenses     Past Surgical History:  Procedure Laterality Date  . BILATERAL TOTAL MASTECTOMY WITH AXILLARY LYMPH NODE DISSECTION  04/20/2017  . BREAST BIOPSY Left 02/18/2015  . BREAST BIOPSY Right 02/18/2015  . BREAST EXCISIONAL BIOPSY Left 04/05/2015  . BREAST LUMPECTOMY Left 03/27/2015  . BREAST LUMPECTOMY WITH NEEDLE LOCALIZATION AND AXILLARY SENTINEL LYMPH NODE BX Left 03/27/2015   Procedure: LEFT BREAST LUMPECTOMY WITH BRACKETED NEEDLE LOCALIZATION AND LEFT  AXILLARY SENTINEL LYMPH NODE BX;  Surgeon: [name], MD;  Location: [place];  Service: General;  Laterality: Left;  . BREAST RECONSTRUCTION WITH PLACEMENT OF TISSUE EXPANDER AND FLEX HD (ACELLULAR HYDRATED DERMIS) Bilateral 04/20/2017   Procedure: BILATARAL BREAST RECONSTRUCTION WITH PLACEMENT OF TISSUE EXPANDER AND FLEX HD  (ACELLULAR HYDRATED DERMIS);  Surgeon: Wallace Going, DO;  Location: [place];  Service: Plastics;  Laterality: Bilateral;  . BREAST SURGERY    . CHOLECYSTECTOMY N/A 07/07/2017   Procedure: LAPAROSCOPIC CHOLECYSTECTOMY WITH INTRAOPERATIVE CHOLANGIOGRAM;  Surgeon: [name], MD;  [facility];  Service: General;  Laterality: N/A;  . MASTECTOMY W/ SENTINEL NODE BIOPSY Bilateral 04/20/2017   Procedure: BILATERAL TOTAL MASTECTOMY WITH LEFT AXILLARY SENTINEL LYMPH NODE BIOPSY;  Surgeon: [name], MD;  Location: [place];  Service: General;  Laterality: Bilateral;  . PORTA CATH REMOVAL Right 11/04/2017   Procedure: PORTA CATH REMOVAL;  Surgeon: Wallace Going, DO;  Location: [place];  Service: Plastics;  Laterality: Right;  . PORTACATH PLACEMENT Right 04/25/2015   Procedure: INSERTION PORT-A-CATH;  Surgeon: [name], MD;  Location: [place];  Service: General;  Laterality: Right;  . PORTACATH PLACEMENT Right 04/20/2017   Procedure: INSERTION PORT-A-CATH;  Surgeon: [name], MD;  Location: [place];  Service: General;  Laterality: Right;  . RE-EXCISION OF BREAST LUMPECTOMY Left 04/05/2015   Procedure: RE-EXCISION LEFT  BREAST LUMPECTOMY;  Surgeon: [name], MD;  Location: [place];  Service: General;  Laterality: Left;  . REMOVAL OF TISSUE EXPANDER AND PLACEMENT OF IMPLANT Bilateral 11/04/2017   Procedure: BILATERAL REMOVAL OF TISSUE EXPANDER AND PLACEMENT OF  BILATERAL BREAST IMPLANT;  Surgeon: Wallace Going, DO;  Location: [place];  Service: Plastics;  Laterality: Bilateral;    Family History  Problem Relation Age of Onset  .  Lung cancer Maternal Grandmother 7       non smoker  . Lung cancer Maternal Grandfather 58       non smoker worked in [employer]  . Cancer Paternal Grandfather 35       throat cancer ? smoker  . Cancer Cousin 41       throat cancer ? smoker    Social History:  reports that she has never smoked. She has never used smokeless tobacco. She reports that she drinks alcohol. She reports that she does not use drugs.  Allergies:  Allergies  Allergen Reactions  . Adhesive [Tape] Other (See Comments)    Skin blisters     (Not in a hospital admission)  No results found for this or any previous visit (from the past 48 hour(s)). No results found.  Review of Systems  Constitutional: Negative.   HENT: Negative.   Eyes: Negative.   Respiratory: Negative.   Cardiovascular: Negative.   Gastrointestinal: Negative.   Genitourinary: Negative.   Musculoskeletal: Negative.   Skin: Negative.   Neurological: Negative.   Psychiatric/Behavioral: Negative.     Last menstrual period 04/02/2015. Physical Exam  Constitutional: [name] oriented to person, place, and time. She appears well-developed and well-nourished.  HENT:  Head: Normocephalic and atraumatic.  Eyes: Pupils are equal, round, and reactive to light. Conjunctivae and EOM are normal.  Cardiovascular: Normal rate.  Respiratory: Effort normal.  GI: Soft.  Neurological: [name] alert and oriented to person, place, and time.  Skin: Skin is warm.  Psychiatric: She has a normal mood and affect. Her behavior is normal. Judgment and thought content normal.     Assessment/Plan Plan for fat grafting bilateral breasts for better symmetry.    [place], DO 05/03/2018, 9:46 AM

## 2018-05-03 NOTE — H&P (Signed)
Cheryl [name] is an 42 y.o. female.   Chief Complaint: acquired absence of breast HPI: The patient is a 42 yrs old wf here for follow up on her breast reconstruction.  She now has the implants in place and is pleased with the progress.  She had redness on the left breast (radiated side) after the implants were placed.  This has completely resolved.  There is firmness on the right medial and lateral aspect.  There is no sign of infection or seroma.  History: She had two lumpectomies on the LEFT breast in 2016 followed by radiation.  Then a new triple negative invasive ductal carcinoma of the lower inner left breast. Screening mammography 01/2015 showed calcifications in the upper outer quadrant of the LEFT breast, ductal carcinoma in situ, ER/PR positive tumor. Bilateral breast MRI confirmed an upper outer quadrant lesion.  A RIGHT breast lesion was found and biopsy showed a fibroadenoma.  The patient is 5 feet 2 inches tall, weight is 212 pounds. Preop bra= 38DD.  The NAC are grade 2 ptosis   Past Medical History:  Diagnosis Date  . Breast cancer ([place])    left breast  . Wears contact lenses     Past Surgical History:  Procedure Laterality Date  . BILATERAL TOTAL MASTECTOMY WITH AXILLARY LYMPH NODE DISSECTION  04/20/2017  . BREAST BIOPSY Left 02/18/2015  . BREAST BIOPSY Right 02/18/2015  . BREAST EXCISIONAL BIOPSY Left 04/05/2015  . BREAST LUMPECTOMY Left 03/27/2015  . BREAST LUMPECTOMY WITH NEEDLE LOCALIZATION AND AXILLARY SENTINEL LYMPH NODE BX Left 03/27/2015   Procedure: LEFT BREAST LUMPECTOMY WITH BRACKETED NEEDLE LOCALIZATION AND LEFT  AXILLARY SENTINEL LYMPH NODE BX;  Surgeon: [name], MD;  Location: [place];  Service: General;  Laterality: Left;  . BREAST RECONSTRUCTION WITH PLACEMENT OF TISSUE EXPANDER AND FLEX HD (ACELLULAR HYDRATED DERMIS) Bilateral 04/20/2017   Procedure: BILATARAL BREAST RECONSTRUCTION WITH PLACEMENT OF TISSUE EXPANDER AND FLEX HD  (ACELLULAR HYDRATED DERMIS);  Surgeon: Wallace Going, DO;  Location: [place];  Service: Plastics;  Laterality: Bilateral;  . BREAST SURGERY    . CHOLECYSTECTOMY N/A 07/07/2017   Procedure: LAPAROSCOPIC CHOLECYSTECTOMY WITH INTRAOPERATIVE CHOLANGIOGRAM;  Surgeon: [name], MD;  [facility];  Service: General;  Laterality: N/A;  . MASTECTOMY W/ SENTINEL NODE BIOPSY Bilateral 04/20/2017   Procedure: BILATERAL TOTAL MASTECTOMY WITH LEFT AXILLARY SENTINEL LYMPH NODE BIOPSY;  Surgeon: [name], MD;  Location: [facility];  Service: General;  Laterality: Bilateral;  . PORTA CATH REMOVAL Right 11/04/2017   Procedure: PORTA CATH REMOVAL;  Surgeon: Wallace Going, DO;  Location: [place];  Service: Plastics;  Laterality: Right;  . PORTACATH PLACEMENT Right 04/25/2015   Procedure: INSERTION PORT-A-CATH;  Surgeon: [name], MD;  Location: [facility];  Service: General;  Laterality: Right;  . PORTACATH PLACEMENT Right 04/20/2017   Procedure: INSERTION PORT-A-CATH;  Surgeon: [name], MD;  Location: [place];  Service: General;  Laterality: Right;  . RE-EXCISION OF BREAST LUMPECTOMY Left 04/05/2015   Procedure: RE-EXCISION LEFT  BREAST LUMPECTOMY;  Surgeon: [name], MD;  Location: [facility];  Service: General;  Laterality: Left;  . REMOVAL OF TISSUE EXPANDER AND PLACEMENT OF IMPLANT Bilateral 11/04/2017   Procedure: BILATERAL REMOVAL OF TISSUE EXPANDER AND PLACEMENT OF  BILATERAL BREAST IMPLANT;  Surgeon: Wallace Going, DO;  Location: [place];  Service: Plastics;  Laterality: Bilateral;    Family History  Problem Relation Age of Onset  .  Lung cancer Maternal Grandmother 62       non smoker  . Lung cancer Maternal Grandfather 90       non smoker worked in [employer]  . Cancer Paternal Grandfather 35       throat cancer ? smoker  . Cancer Cousin 41       throat cancer ? smoker    Social History:  reports that she has never smoked. She has never used smokeless tobacco. She reports that she drinks alcohol. She reports that she does not use drugs.  Allergies:  Allergies  Allergen Reactions  . Adhesive [Tape] Other (See Comments)    Skin blisters     (Not in a hospital admission)  No results found for this or any previous visit (from the past 48 hour(s)). No results found.  Review of Systems  Constitutional: Negative.   HENT: Negative.   Eyes: Negative.   Respiratory: Negative.   Cardiovascular: Negative.   Gastrointestinal: Negative.   Genitourinary: Negative.   Musculoskeletal: Negative.   Skin: Negative.   Neurological: Negative.   Psychiatric/Behavioral: Negative.     Last menstrual period 04/02/2015. Physical Exam  Constitutional: [name] oriented to person, place, and time. She appears well-developed and well-nourished.  HENT:  Head: Normocephalic and atraumatic.  Eyes: Pupils are equal, round, and reactive to light. Conjunctivae and EOM are normal.  Cardiovascular: Normal rate.  Respiratory: Effort normal.  GI: Soft.  Neurological: [name] alert and oriented to person, place, and time.  Skin: Skin is warm.  Psychiatric: She has a normal mood and affect. Her behavior is normal. Judgment and thought content normal.     Assessment/Plan Plan for fat grafting bilateral breasts for better symmetry.    [place], DO 05/03/2018, 9:46 AM

## 2018-05-03 NOTE — Telephone Encounter (Signed)
Spoke with patient concerning abdominal cramping that's been ongoing for about a month.  Patient states her primary has ruled out UTI/kidney stones and now suggests she have an ultrasound of her ovaries/uterus. Patient says her pain is in the lower abdominal area, constant and worse in the morning, like cramping but even worse.  Appt made to see Cheryl [name] in [facility] today.

## 2018-05-04 ENCOUNTER — Telehealth: Payer: Self-pay | Admitting: Medical

## 2018-05-04 NOTE — Telephone Encounter (Signed)
No 6/11 los °

## 2018-05-04 NOTE — Progress Notes (Signed)
[facility] Progress Note   Cheryl [name] [id number] [birth year] 42 y.o.  Cheryl [name] is managed by Dr. Jana Hakim  Actively treated with chemotherapy/immunotherapy: no  Current Therapy: Anastrozole   Assessment: Plan:    Combined abdominal and pelvic pain - Plan: hyoscyamine (LEVSIN, ANASPAZ) 0.125 MG tablet   Combined abdominal and pelvic pain: I suggested to the patient that her pain could be associated with spasms of the colon.  She was given a prescription for hyoscyamine 0.125 mg.  I also reviewed her recent CT scans which returned negative.  She has a transvaginal ultrasound already ordered.  The patient was not charged for this visit today as her primary care provider had already ordered CT scans which have been done.  A another provider through the survivorship clinic has already ordered a transvaginal ultrasound on the patient.  Please see After Visit Summary for patient specific instructions.  Future Appointments  Date Time Provider [place]  07/29/2018  8:30 AM [facility] None  08/05/2018  8:45 AM Opalski, [name], DO [facility] None  10/13/2018  2:00 PM [facility] None  10/13/2018  2:30 PM Magrinat, Virgie Dad, MD [facility] None    No orders of the defined types were placed in this encounter.      Subjective:   Patient ID:  Cheryl [name] is a 42 y.o. (DOB 1976/11/13) female.  Chief Complaint:  Chief Complaint  Patient presents with  . Abdominal Pain    HPI Cheryl [name] is a 42 year old female with a history of a recurrent triple negative breast cancer who is followed by Dr. Jana Hakim and is currently on anastrozole.  She presents to the office today with a one-month history of lower abdomen and pelvic pain mostly on the right side and in the middle.  She has a history of urinary frequency and incomplete emptying of her bladder.  She has been taking ibuprofen but is stopped  this medication.  She has been seen by her primary care provider recently who ordered CT scans of her abdomen and pelvis which returned negative.  She has a transvaginal ultrasound pending.  Her lower pelvic and abdominal pain is not associated with any activity.  She denies vaginal bleeding nausea vomiting or diarrhea.  Medications: I have reviewed the patient's current medications.  Allergies:  Allergies  Allergen Reactions  . Adhesive [Tape] Other (See Comments)    Skin blisters    Past Medical History:  Diagnosis Date  . Breast cancer ([place])    left breast  . Wears contact lenses     Past Surgical History:  Procedure Laterality Date  . BILATERAL TOTAL MASTECTOMY WITH AXILLARY LYMPH NODE DISSECTION  04/20/2017  . BREAST BIOPSY Left 02/18/2015  . BREAST BIOPSY Right 02/18/2015  . BREAST EXCISIONAL BIOPSY Left 04/05/2015  . BREAST LUMPECTOMY Left 03/27/2015  . BREAST LUMPECTOMY WITH NEEDLE LOCALIZATION AND AXILLARY SENTINEL LYMPH NODE BX Left 03/27/2015   Procedure: LEFT BREAST LUMPECTOMY WITH BRACKETED NEEDLE LOCALIZATION AND LEFT  AXILLARY SENTINEL LYMPH NODE BX;  Surgeon: [name], MD;  Location: [place];  Service: General;  Laterality: Left;  . BREAST RECONSTRUCTION WITH PLACEMENT OF TISSUE EXPANDER AND FLEX HD (ACELLULAR HYDRATED DERMIS) Bilateral 04/20/2017   Procedure: BILATARAL BREAST RECONSTRUCTION WITH PLACEMENT OF TISSUE EXPANDER AND FLEX HD (ACELLULAR HYDRATED DERMIS);  Surgeon: Wallace Going, DO;  Location: [place];  Service: Plastics;  Laterality: Bilateral;  . BREAST SURGERY    .  CHOLECYSTECTOMY N/A 07/07/2017   Procedure: LAPAROSCOPIC CHOLECYSTECTOMY WITH INTRAOPERATIVE CHOLANGIOGRAM;  Surgeon: [name], MD;  [facility];  Service: General;  Laterality: N/A;  . MASTECTOMY W/ SENTINEL NODE BIOPSY Bilateral 04/20/2017   Procedure: BILATERAL TOTAL MASTECTOMY WITH LEFT AXILLARY SENTINEL LYMPH NODE BIOPSY;  Surgeon: [name], MD;  Location: [place];  Service: General;  Laterality: Bilateral;  . PORTA CATH REMOVAL Right 11/04/2017   Procedure: PORTA CATH REMOVAL;  Surgeon: Wallace Going, DO;  Location: [place];  Service: Plastics;  Laterality: Right;  . PORTACATH PLACEMENT Right 04/25/2015   Procedure: INSERTION PORT-A-CATH;  Surgeon: [name], MD;  Location: [place];  Service: General;  Laterality: Right;  . PORTACATH PLACEMENT Right 04/20/2017   Procedure: INSERTION PORT-A-CATH;  Surgeon: [name], MD;  Location: [place];  Service: General;  Laterality: Right;  . RE-EXCISION OF BREAST LUMPECTOMY Left 04/05/2015   Procedure: RE-EXCISION LEFT  BREAST LUMPECTOMY;  Surgeon: [name], MD;  Location: [place];  Service: General;  Laterality: Left;  . REMOVAL OF TISSUE EXPANDER AND PLACEMENT OF IMPLANT Bilateral 11/04/2017   Procedure: BILATERAL REMOVAL OF TISSUE EXPANDER AND PLACEMENT OF  BILATERAL BREAST IMPLANT;  Surgeon: Wallace Going, DO;  Location: [place];  Service: Plastics;  Laterality: Bilateral;    Family History  Problem Relation Age of Onset  . Lung cancer Maternal Grandmother 37       non smoker  . Lung cancer Maternal Grandfather 71       non smoker worked in [employer]  . Cancer Paternal Grandfather 35       throat cancer ? smoker  . Cancer Cousin 41       throat cancer ? smoker    Social History   Socioeconomic History  . Marital status: Divorced    Spouse name: Not on file  . Number of children: 3  . Years of education: Not on file  . Highest education level: Not on file  Occupational History  . Not on file  Social Needs  . Financial resource strain: Not on file  . Food insecurity:    Worry: Not on file    Inability: Not on file  . Transportation needs:    Medical: Not on file    Non-medical: Not on file  Tobacco Use  . Smoking status: Never Smoker  . Smokeless  tobacco: Never Used  Substance and Sexual Activity  . Alcohol use: Yes    Alcohol/week: 0.0 oz    Comment: occ  . Drug use: No  . Sexual activity: Yes  Lifestyle  . Physical activity:    Days per week: Not on file    Minutes per session: Not on file  . Stress: Not on file  Relationships  . Social connections:    Talks on phone: Not on file    Gets together: Not on file    Attends religious service: Not on file    Active member of club or organization: Not on file    Attends meetings of clubs or organizations: Not on file    Relationship status: Not on file  . Intimate partner violence:    Fear of current or ex partner: Not on file    Emotionally abused: Not on file    Physically abused: Not on file    Forced sexual activity: Not on file  Other Topics Concern  . Not on file  Social History Narrative  .  Not on file    Past Medical History, Surgical history, Social history, and Family history were reviewed and updated as appropriate.   Please see review of systems for further details on the patient's review from today.   Review of Systems:  Review of Systems  Constitutional: Negative for appetite change, chills, diaphoresis and fever.  HENT: Negative for trouble swallowing.   Respiratory: Negative for chest tightness and shortness of breath.   Cardiovascular: Negative for chest pain, palpitations and leg swelling.  Gastrointestinal: Positive for abdominal pain. Negative for abdominal distention, blood in stool, constipation, diarrhea, nausea and vomiting.  Genitourinary: Positive for frequency and pelvic pain.       Incomplete emptying of the bladder    Objective:   Physical Exam:  BP (!) 148/89 (BP Location: Right Arm, Patient Position: Sitting) Comment: Liza RN is aware  Pulse 84   Temp 98.6 F (37 C) (Oral)   Resp 18   Ht 5\' 2"  (1.575 m)   Wt 208 lb (94.3 kg)   LMP 04/02/2015   SpO2 100%   BMI 38.04 kg/m  ECOG: 0  Physical Exam  Constitutional: She  appears well-developed and well-nourished. She does not appear ill. No distress.  Neurological: [name] alert.  Psychiatric: She has a normal mood and affect. Her behavior is normal.    Lab Review:     Component Value Date/Time   NA 138 04/26/2018 1221   NA 140 10/21/2017 1206   K 4.0 04/26/2018 1221   K 4.0 10/21/2017 1206   CL 98 04/26/2018 1221   CO2 23 04/26/2018 1221   CO2 24 10/21/2017 1206   GLUCOSE 94 04/26/2018 1221   GLUCOSE 117 03/24/2018 1549   GLUCOSE 98 10/21/2017 1206   BUN 10 04/26/2018 1221   BUN 6.5 (L) 10/21/2017 1206   CREATININE 0.78 04/26/2018 1221   CREATININE 0.9 10/21/2017 1206   CALCIUM 9.9 04/26/2018 1221   CALCIUM 9.8 10/21/2017 1206   PROT 7.3 04/26/2018 1221   PROT 7.5 10/21/2017 1206   ALBUMIN 4.8 04/26/2018 1221   ALBUMIN 3.9 10/21/2017 1206   AST 27 04/26/2018 1221   AST 32 10/21/2017 1206   ALT 30 04/26/2018 1221   ALT 37 10/21/2017 1206   ALKPHOS 98 04/26/2018 1221   ALKPHOS 105 10/21/2017 1206   BILITOT 0.9 04/26/2018 1221   BILITOT 0.64 10/21/2017 1206   GFRNONAA 93 04/26/2018 1221   GFRAA 108 04/26/2018 1221       Component Value Date/Time   WBC 6.5 04/26/2018 1221   WBC 7.2 03/24/2018 1549   RBC 4.53 04/26/2018 1221   RBC 4.15 03/24/2018 1549   HGB 13.2 04/26/2018 1221   HGB 12.4 10/21/2017 1206   HCT 39.5 04/26/2018 1221   HCT 37.5 10/21/2017 1206   PLT 358 04/26/2018 1221   MCV 87 04/26/2018 1221   MCV 89.5 10/21/2017 1206   MCH 29.1 04/26/2018 1221   MCH 29.4 03/24/2018 1549   MCHC 33.4 04/26/2018 1221   MCHC 33.2 03/24/2018 1549   RDW 15.1 04/26/2018 1221   RDW 15.2 (H) 10/21/2017 1206   LYMPHSABS 1.8 04/26/2018 1221   LYMPHSABS 1.9 10/21/2017 1206   MONOABS 0.4 03/24/2018 1549   MONOABS 0.7 10/21/2017 1206   EOSABS 0.2 04/26/2018 1221   BASOSABS 0.0 04/26/2018 1221   BASOSABS 0.1 10/21/2017 1206   -------------------------------  Imaging from last 24 hours (if applicable):  Radiology interpretation: Ct  Abdomen Pelvis Wo Contrast  Result Date: 04/26/2018 CLINICAL DATA:  Right lower quadrant pain and nausea. History of breast carcinoma EXAM: CT ABDOMEN AND PELVIS WITHOUT CONTRAST TECHNIQUE: Multidetector CT imaging of the abdomen and pelvis was performed following the standard protocol without oral or IV contrast. COMPARISON:  July 24, 2017 FINDINGS: Lower chest: Lung bases are clear. Hepatobiliary: No focal liver lesions are evident on this noncontrast enhanced study. An enhancing lesion near the fissure for the ligamentum teres seen on contrast enhanced study is not appreciable on this noncontrast enhanced study. Gallbladder is absent. There is no biliary duct dilatation. Pancreas: No pancreatic mass or inflammatory focus evident. Spleen: No splenic lesions are apparent. Adrenals/Urinary Tract: Adrenals bilaterally are unremarkable. Kidneys bilaterally show no evident mass or hydronephrosis on either side. There is a junctional parenchymal defect in each kidney, an anatomic variant. There is no evident renal or ureteral calculus on either side. Urinary bladder is midline with wall thickness within normal limits. Stomach/Bowel: There is no appreciable bowel wall or mesenteric thickening. No evident bowel obstruction. No free air or portal venous air. Vascular/Lymphatic: There is no abdominal aortic aneurysm. There are scattered foci of atherosclerotic calcification in the aorta. Major mesenteric arterial vessels appear patent on this noncontrast enhanced study. There is no demonstrable adenopathy in the abdomen or pelvis. Reproductive: Uterus is anteverted. Stents noted in each fallopian tube. No pelvic mass. Other: Appendix appears normal. There is no ascites or abscess in the abdomen or pelvis. There is a fairly small ventral hernia containing only fat. Musculoskeletal: There are no evident blastic or lytic bone lesions. There are breast implants bilaterally. Patient is status post mastectomies bilaterally.  There is no intramuscular or abdominal wall lesion evident. IMPRESSION: 1.  No bowel obstruction.  No abscess.  Appendix appears normal. 2.  No renal or ureteral calculus.  No hydronephrosis. 3. Previously noted enhancing lesion in the anterior segment right lobe of the liver is not appreciable on this noncontrast enhanced study. No focal liver lesions are evident on this noncontrast enhanced study. Gallbladder is absent. 4.  Stents in each fallopian tube, stable. 5.  Foci of aortic atherosclerosis noted. 6.  Ventral hernia containing only fat. Aortic Atherosclerosis (ICD10-I70.0). Electronically Signed   By: Lowella Grip III M.D.   On: 04/26/2018 15:08

## 2018-05-06 ENCOUNTER — Ambulatory Visit: Payer: 59 | Admitting: Medical

## 2018-05-09 ENCOUNTER — Other Ambulatory Visit: Payer: Self-pay | Admitting: Adult Health

## 2018-05-09 DIAGNOSIS — R102 Pelvic and perineal pain: Secondary | ICD-10-CM

## 2018-05-13 ENCOUNTER — Ambulatory Visit: Payer: Self-pay | Admitting: Physician Assistant

## 2018-05-13 ENCOUNTER — Encounter (HOSPITAL_BASED_OUTPATIENT_CLINIC_OR_DEPARTMENT_OTHER): Payer: Self-pay | Admitting: *Deleted

## 2018-05-13 ENCOUNTER — Ambulatory Visit (HOSPITAL_COMMUNITY): Payer: 59

## 2018-05-13 ENCOUNTER — Ambulatory Visit (HOSPITAL_COMMUNITY)
Admission: RE | Admit: 2018-05-13 | Discharge: 2018-05-13 | Disposition: A | Discharge status: Home / Self Care | Payer: 59 | Source: Ambulatory Visit | Attending: Adult Health | Admitting: Adult Health

## 2018-05-13 ENCOUNTER — Other Ambulatory Visit: Payer: Self-pay

## 2018-05-13 DIAGNOSIS — Z780 Asymptomatic menopausal state: Secondary | ICD-10-CM | POA: Insufficient documentation

## 2018-05-13 DIAGNOSIS — N950 Postmenopausal bleeding: Secondary | ICD-10-CM | POA: Diagnosis not present

## 2018-05-13 DIAGNOSIS — R102 Pelvic and perineal pain: Secondary | ICD-10-CM

## 2018-05-17 ENCOUNTER — Other Ambulatory Visit: Payer: Self-pay | Admitting: Oncology

## 2018-05-17 ENCOUNTER — Encounter: Payer: Self-pay | Admitting: Oncology

## 2018-05-20 ENCOUNTER — Ambulatory Visit (HOSPITAL_BASED_OUTPATIENT_CLINIC_OR_DEPARTMENT_OTHER)
Admission: RE | Admit: 2018-05-20 | Discharge: 2018-05-20 | Disposition: A | Discharge status: Home / Self Care | Payer: 59 | Source: Ambulatory Visit | Attending: Plastic Surgery | Admitting: Plastic Surgery

## 2018-05-20 ENCOUNTER — Encounter (HOSPITAL_BASED_OUTPATIENT_CLINIC_OR_DEPARTMENT_OTHER)
Admission: RE | Disposition: A | Discharge status: Home / Self Care | Payer: Self-pay | Source: Ambulatory Visit | Attending: Plastic Surgery

## 2018-05-20 ENCOUNTER — Encounter (HOSPITAL_BASED_OUTPATIENT_CLINIC_OR_DEPARTMENT_OTHER): Payer: Self-pay | Admitting: Anesthesiology

## 2018-05-20 ENCOUNTER — Ambulatory Visit (HOSPITAL_BASED_OUTPATIENT_CLINIC_OR_DEPARTMENT_OTHER): Payer: 59 | Admitting: Anesthesiology

## 2018-05-20 DIAGNOSIS — Z9013 Acquired absence of bilateral breasts and nipples: Secondary | ICD-10-CM | POA: Diagnosis not present

## 2018-05-20 DIAGNOSIS — Z6838 Body mass index (BMI) 38.0-38.9, adult: Secondary | ICD-10-CM | POA: Insufficient documentation

## 2018-05-20 DIAGNOSIS — L905 Scar conditions and fibrosis of skin: Secondary | ICD-10-CM | POA: Diagnosis not present

## 2018-05-20 DIAGNOSIS — N651 Disproportion of reconstructed breast: Secondary | ICD-10-CM | POA: Diagnosis not present

## 2018-05-20 DIAGNOSIS — Z853 Personal history of malignant neoplasm of breast: Secondary | ICD-10-CM | POA: Insufficient documentation

## 2018-05-20 DIAGNOSIS — E669 Obesity, unspecified: Secondary | ICD-10-CM | POA: Diagnosis not present

## 2018-05-20 HISTORY — PX: LIPOSUCTION WITH LIPOFILLING: SHX6436

## 2018-05-20 SURGERY — LIPOSUCTION, WITH FAT TRANSFER
Anesthesia: General | Laterality: Bilateral

## 2018-05-20 MED ORDER — OXYCODONE HCL 5 MG PO TABS: 5.0000 mg | ORAL_TABLET | ORAL | Status: DC | PRN

## 2018-05-20 MED ORDER — FENTANYL CITRATE (PF) 100 MCG/2ML IJ SOLN
INTRAMUSCULAR | Status: AC
  Filled 2018-05-20: qty 2

## 2018-05-20 MED ORDER — SUGAMMADEX SODIUM 200 MG/2ML IV SOLN
INTRAVENOUS | Status: DC | PRN
  Administered 2018-05-20: 200 mg via INTRAVENOUS

## 2018-05-20 MED ORDER — KETOROLAC TROMETHAMINE 30 MG/ML IJ SOLN
INTRAMUSCULAR | Status: DC | PRN
  Administered 2018-05-20: 30 mg via INTRAVENOUS

## 2018-05-20 MED ORDER — CEFAZOLIN SODIUM-DEXTROSE 2-4 GM/100ML-% IV SOLN
INTRAVENOUS | Status: AC
  Filled 2018-05-20: qty 100

## 2018-05-20 MED ORDER — DEXAMETHASONE SODIUM PHOSPHATE 10 MG/ML IJ SOLN
INTRAMUSCULAR | Status: AC
  Filled 2018-05-20: qty 1

## 2018-05-20 MED ORDER — MIDAZOLAM HCL 2 MG/2ML IJ SOLN
INTRAMUSCULAR | Status: AC
Start: 2018-05-20 — End: ?
  Filled 2018-05-20: qty 2

## 2018-05-20 MED ORDER — PROPOFOL 10 MG/ML IV BOLUS
INTRAVENOUS | Status: DC | PRN
  Administered 2018-05-20: 150 mg via INTRAVENOUS

## 2018-05-20 MED ORDER — LIDOCAINE HCL (CARDIAC) PF 100 MG/5ML IV SOSY
PREFILLED_SYRINGE | INTRAVENOUS | Status: AC
  Filled 2018-05-20: qty 5

## 2018-05-20 MED ORDER — SUGAMMADEX SODIUM 200 MG/2ML IV SOLN
INTRAVENOUS | Status: AC
  Filled 2018-05-20: qty 2

## 2018-05-20 MED ORDER — CEFAZOLIN SODIUM-DEXTROSE 2-4 GM/100ML-% IV SOLN
2.0000 g | INTRAVENOUS | Status: AC
  Administered 2018-05-20: 2 g via INTRAVENOUS

## 2018-05-20 MED ORDER — EPHEDRINE SULFATE 50 MG/ML IJ SOLN
INTRAMUSCULAR | Status: AC
  Filled 2018-05-20: qty 1

## 2018-05-20 MED ORDER — LACTATED RINGERS IV SOLN
INTRAVENOUS | Status: AC | PRN
  Administered 2018-05-20: 750 mL via INTRAVENOUS

## 2018-05-20 MED ORDER — ROCURONIUM BROMIDE 100 MG/10ML IV SOLN
INTRAVENOUS | Status: DC | PRN
  Administered 2018-05-20: 50 mg via INTRAVENOUS

## 2018-05-20 MED ORDER — KETOROLAC TROMETHAMINE 30 MG/ML IJ SOLN
INTRAMUSCULAR | Status: AC
  Filled 2018-05-20: qty 1

## 2018-05-20 MED ORDER — EPHEDRINE SULFATE 50 MG/ML IJ SOLN
INTRAMUSCULAR | Status: DC | PRN
  Administered 2018-05-20: 10 mg via INTRAVENOUS
  Administered 2018-05-20 (×2): 15 mg via INTRAVENOUS

## 2018-05-20 MED ORDER — ACETAMINOPHEN 650 MG RE SUPP
650.0000 mg | RECTAL | Status: DC | PRN
Start: 2018-05-20 — End: 2018-05-20

## 2018-05-20 MED ORDER — PROPOFOL 500 MG/50ML IV EMUL
INTRAVENOUS | Status: AC
  Filled 2018-05-20: qty 50

## 2018-05-20 MED ORDER — SODIUM CHLORIDE 0.9% FLUSH: 3.0000 mL | INTRAVENOUS | Status: DC | PRN

## 2018-05-20 MED ORDER — LACTATED RINGERS IV SOLN
INTRAVENOUS | Status: DC
  Administered 2018-05-20: 07:00:00 via INTRAVENOUS

## 2018-05-20 MED ORDER — FENTANYL CITRATE (PF) 100 MCG/2ML IJ SOLN: 25.0000 ug | INTRAMUSCULAR | Status: DC | PRN

## 2018-05-20 MED ORDER — SODIUM CHLORIDE 0.9 % IV SOLN: 250.0000 mL | INTRAVENOUS | Status: DC | PRN

## 2018-05-20 MED ORDER — SODIUM CHLORIDE 0.9% FLUSH: 3.0000 mL | Freq: Two times a day (BID) | INTRAVENOUS | Status: DC

## 2018-05-20 MED ORDER — MIDAZOLAM HCL 2 MG/2ML IJ SOLN
1.0000 mg | INTRAMUSCULAR | Status: DC | PRN
  Administered 2018-05-20: 2 mg via INTRAVENOUS

## 2018-05-20 MED ORDER — PROMETHAZINE HCL 25 MG/ML IJ SOLN: 6.2500 mg | INTRAMUSCULAR | Status: DC | PRN

## 2018-05-20 MED ORDER — ONDANSETRON HCL 4 MG/2ML IJ SOLN
INTRAMUSCULAR | Status: DC | PRN
  Administered 2018-05-20: 4 mg via INTRAVENOUS

## 2018-05-20 MED ORDER — BUPIVACAINE-EPINEPHRINE 0.25% -1:200000 IJ SOLN
INTRAMUSCULAR | Status: DC | PRN
  Administered 2018-05-20: 15 mL

## 2018-05-20 MED ORDER — FENTANYL CITRATE (PF) 100 MCG/2ML IJ SOLN
50.0000 ug | INTRAMUSCULAR | Status: DC | PRN
  Administered 2018-05-20: 100 ug via INTRAVENOUS
  Administered 2018-05-20: 2 ug via INTRAVENOUS

## 2018-05-20 MED ORDER — LIDOCAINE HCL 1 % IJ SOLN
INTRAMUSCULAR | Status: DC | PRN
  Administered 2018-05-20: 50 mL

## 2018-05-20 MED ORDER — ACETAMINOPHEN 325 MG PO TABS: 650.0000 mg | ORAL_TABLET | ORAL | Status: DC | PRN

## 2018-05-20 MED ORDER — DEXAMETHASONE SODIUM PHOSPHATE 4 MG/ML IJ SOLN
INTRAMUSCULAR | Status: DC | PRN
  Administered 2018-05-20: 10 mg via INTRAVENOUS

## 2018-05-20 MED ORDER — ONDANSETRON HCL 4 MG/2ML IJ SOLN
INTRAMUSCULAR | Status: AC
  Filled 2018-05-20: qty 2

## 2018-05-20 MED ORDER — EPINEPHRINE PF 1 MG/ML IJ SOLN
INTRAMUSCULAR | Status: DC | PRN
  Administered 2018-05-20: 1 mg via SUBCUTANEOUS

## 2018-05-20 MED ORDER — SCOPOLAMINE 1 MG/3DAYS TD PT72: 1.0000 | MEDICATED_PATCH | Freq: Once | TRANSDERMAL | Status: DC | PRN

## 2018-05-20 MED ORDER — LIDOCAINE HCL 1 % IJ SOLN
INTRAVENOUS | Status: DC | PRN
  Administered 2018-05-20: 750 mL

## 2018-05-20 MED ORDER — LIDOCAINE HCL (CARDIAC) PF 100 MG/5ML IV SOSY
PREFILLED_SYRINGE | INTRAVENOUS | Status: DC | PRN
  Administered 2018-05-20: 100 mg via INTRAVENOUS

## 2018-05-20 SURGICAL SUPPLY — 51 items
BINDER ABDOMINAL  9 SM 30-45 (SOFTGOODS)
BINDER ABDOMINAL 10 UNV 27-48 (MISCELLANEOUS) ×3 IMPLANT
BINDER ABDOMINAL 12 SM 30-45 (SOFTGOODS) IMPLANT
BINDER ABDOMINAL 9 SM 30-45 (SOFTGOODS) IMPLANT
BINDER BREAST LRG (GAUZE/BANDAGES/DRESSINGS) IMPLANT
BINDER BREAST MEDIUM (GAUZE/BANDAGES/DRESSINGS) IMPLANT
BINDER BREAST XLRG (GAUZE/BANDAGES/DRESSINGS) ×3 IMPLANT
BINDER BREAST XXLRG (GAUZE/BANDAGES/DRESSINGS) IMPLANT
BLADE HEX COATED 2.75 (ELECTRODE) IMPLANT
BLADE SURG 15 STRL LF DISP TIS (BLADE) ×1 IMPLANT
BLADE SURG 15 STRL SS (BLADE) ×2
BNDG GAUZE ELAST 4 BULKY (GAUZE/BANDAGES/DRESSINGS) ×6 IMPLANT
CHLORAPREP W/TINT 26ML (MISCELLANEOUS) ×6 IMPLANT
COVER BACK TABLE 60X90IN (DRAPES) ×3 IMPLANT
COVER MAYO STAND STRL (DRAPES) ×3 IMPLANT
DECANTER SPIKE VIAL GLASS SM (MISCELLANEOUS) IMPLANT
DERMABOND ADVANCED (GAUZE/BANDAGES/DRESSINGS) ×2
DERMABOND ADVANCED .7 DNX12 (GAUZE/BANDAGES/DRESSINGS) ×1 IMPLANT
DRAPE LAPAROSCOPIC ABDOMINAL (DRAPES) ×3 IMPLANT
DRSG PAD ABDOMINAL 8X10 ST (GAUZE/BANDAGES/DRESSINGS) ×12 IMPLANT
ELECT REM PT RETURN 9FT ADLT (ELECTROSURGICAL) ×3
ELECTRODE REM PT RTRN 9FT ADLT (ELECTROSURGICAL) ×1 IMPLANT
EXTRACTOR CANIST REVOLVE STRL (CANNISTER) ×3 IMPLANT
GLOVE BIO SURGEON STRL SZ 6.5 (GLOVE) ×8 IMPLANT
GLOVE BIO SURGEON STRL SZ7 (GLOVE) ×3 IMPLANT
GLOVE BIO SURGEONS STRL SZ 6.5 (GLOVE) ×4
GOWN STRL REUS W/ TWL LRG LVL3 (GOWN DISPOSABLE) ×2 IMPLANT
GOWN STRL REUS W/ TWL XL LVL3 (GOWN DISPOSABLE) ×1 IMPLANT
GOWN STRL REUS W/TWL LRG LVL3 (GOWN DISPOSABLE) ×4
GOWN STRL REUS W/TWL XL LVL3 (GOWN DISPOSABLE) ×2
IV LACTATED RINGERS 1000ML (IV SOLUTION) ×9 IMPLANT
LINER CANISTER 1000CC FLEX (MISCELLANEOUS) ×6 IMPLANT
NDL SAFETY ECLIPSE 18X1.5 (NEEDLE) ×1 IMPLANT
NEEDLE HYPO 18GX1.5 SHARP (NEEDLE) ×2
NEEDLE HYPO 25X1 1.5 SAFETY (NEEDLE) ×3 IMPLANT
PACK BASIN DAY SURGERY FS (CUSTOM PROCEDURE TRAY) ×3 IMPLANT
PAD ALCOHOL SWAB (MISCELLANEOUS) ×3 IMPLANT
PENCIL BUTTON HOLSTER BLD 10FT (ELECTRODE) IMPLANT
SLEEVE SCD COMPRESS KNEE MED (MISCELLANEOUS) ×3 IMPLANT
SPONGE LAP 18X18 RF (DISPOSABLE) ×6 IMPLANT
SUT MNCRL AB 4-0 PS2 18 (SUTURE) IMPLANT
SUT MON AB 5-0 PS2 18 (SUTURE) ×3 IMPLANT
SYR 10ML LL (SYRINGE) ×12 IMPLANT
SYR 3ML 18GX1 1/2 (SYRINGE) IMPLANT
SYR 50ML LL SCALE MARK (SYRINGE) IMPLANT
SYR CONTROL 10ML LL (SYRINGE) ×3 IMPLANT
SYR TOOMEY 50ML (SYRINGE) ×6 IMPLANT
TOWEL GREEN STERILE FF (TOWEL DISPOSABLE) ×6 IMPLANT
TUBING INFILTRATION IT-10001 (TUBING) ×3 IMPLANT
TUBING SET GRADUATE ASPIR 12FT (MISCELLANEOUS) ×3 IMPLANT
UNDERPAD 30X30 (UNDERPADS AND DIAPERS) ×6 IMPLANT

## 2018-05-20 NOTE — Discharge Instructions (Signed)
No heavy lifting. Up walking. No heavy lifting. May shower tomorrow. Continue binder or sports bra. Continue abdominal binder or spanx.   Post Anesthesia Home Care Instructions  Activity: Get plenty of rest for the remainder of the day. A responsible individual must stay with you for 24 hours following the procedure.  For the next 24 hours, DO NOT: -Drive a car -Paediatric nurse -Drink alcoholic beverages -Take any medication unless instructed by your physician -Make any legal decisions or sign important papers.  Meals: Start with liquid foods such as gelatin or soup. Progress to regular foods as tolerated. Avoid greasy, spicy, heavy foods. If nausea and/or vomiting occur, drink only clear liquids until the nausea and/or vomiting subsides. Call your physician if vomiting continues.  Special Instructions/Symptoms: Your throat may feel dry or sore from the anesthesia or the breathing tube placed in your throat during surgery. If this causes discomfort, gargle with warm salt water. The discomfort should disappear within 24 hours.  If you had a scopolamine patch placed behind your ear for the management of post- operative nausea and/or vomiting:  1. The medication in the patch is effective for 72 hours, after which it should be removed.  Wrap patch in a tissue and discard in the trash. Wash hands thoroughly with soap and water. 2. You may remove the patch earlier than 72 hours if you experience unpleasant side effects which may include dry mouth, dizziness or visual disturbances. 3. Avoid touching the patch. Wash your hands with soap and water after contact with the patch.

## 2018-05-20 NOTE — Interval H&P Note (Signed)
History and Physical Interval Note:  05/20/2018 6:48 AM  Cheryl [name]  has presented today for surgery, with the diagnosis of Acquired Absence Of Bilateral Breasts And Nipples, Malignant neoplasm of lower-inner quadrant of left breast in female, estrogen receptor negative,Malignant neoplasm of upper-outer quadrant of left breast in female, estrogen receptor positive  The various methods of treatment have been discussed with the patient and family. After consideration of risks, benefits and other options for treatment, the patient has consented to  Procedure(s): Bilateral breast fat grafting to improve symmetry following breast reconstruction (Bilateral) as a surgical intervention .  The patient's history has been reviewed, patient examined, no change in status, stable for surgery.  I have reviewed the patient's chart and labs.  Questions were answered to the patient's satisfaction.     [name] Dillingham

## 2018-05-20 NOTE — Transfer of Care (Signed)
Immediate Anesthesia Transfer of Care Note  Patient: Cheryl [name]  Procedure(s) Performed: Bilateral breast fat grafting from abdomen to improve symmetry following breast reconstruction (Bilateral )  Patient Location: PACU  Anesthesia Type:General  Level of Consciousness: awake and sedated  Airway & Oxygen Therapy: Patient Spontanous Breathing and Patient connected to face mask oxygen  Post-op Assessment: Report given to RN and Post -op Vital signs reviewed and stable  Post vital signs: Reviewed and stable  Last Vitals:  Vitals Value Taken Time  BP 149/81 05/20/2018  8:54 AM  Temp    Pulse 119 05/20/2018  8:57 AM  Resp 23 05/20/2018  8:57 AM  SpO2 100 % 05/20/2018  8:57 AM  Vitals shown include unvalidated device data.  Last Pain:  Vitals:   05/20/18 0647  TempSrc: Oral  PainSc: 0-No pain         Complications: No apparent anesthesia complications

## 2018-05-20 NOTE — Anesthesia Procedure Notes (Signed)
Procedure Name: Intubation Performed by: Terrance Mass, CRNA Pre-anesthesia Checklist: Patient identified, Emergency Drugs available, Suction available and Patient being monitored Patient Re-evaluated:Patient Re-evaluated prior to induction Oxygen Delivery Method: Circle system utilized Preoxygenation: Pre-oxygenation with 100% oxygen Induction Type: IV induction Ventilation: Mask ventilation without difficulty Laryngoscope Size: Miller and 2 Grade View: Grade I Tube type: Oral Tube size: 7.0 mm Number of attempts: 1 Airway Equipment and Method: Stylet Placement Confirmation: ETT inserted through vocal cords under direct vision,  positive ETCO2 and breath sounds checked- equal and bilateral Tube secured with: Tape Dental Injury: Teeth and Oropharynx as per pre-operative assessment

## 2018-05-20 NOTE — Anesthesia Postprocedure Evaluation (Signed)
Anesthesia Post Note  Patient: Cheryl [name]  Procedure(s) Performed: Bilateral breast fat grafting from abdomen to improve symmetry following breast reconstruction (Bilateral )     Patient location during evaluation: PACU Anesthesia Type: General Level of consciousness: awake and alert Pain management: pain level controlled Vital Signs Assessment: post-procedure vital signs reviewed and stable Respiratory status: spontaneous breathing, nonlabored ventilation and respiratory function stable Cardiovascular status: blood pressure returned to baseline and stable Postop Assessment: no apparent nausea or vomiting Anesthetic complications: no    Last Vitals:  Vitals:   05/20/18 0915 05/20/18 0930  BP: (!) 142/96 (!) 161/88  Pulse: (!) 105 (!) 105  Resp: (!) 22 16  Temp:  36.9 C  SpO2: 100% 99%    Last Pain:  Vitals:   05/20/18 0930  TempSrc:   PainSc: 0-No pain                 Catalina Gravel

## 2018-05-20 NOTE — Op Note (Signed)
DATE OF OPERATION: 05/20/2018  LOCATION: [facility]  PREOPERATIVE DIAGNOSIS: Breast asymmetry after reconstruction, history of breast cancer  POSTOPERATIVE DIAGNOSIS: Same  PROCEDURE: Fat grafting to bilateral breasts for symmetry and improved contour with release of lateral scar contracture..  SURGEON: Sherlynn Tourville Sanger Raylin Winer, DO  EBL: 10 cc  CONDITION: Stable  COMPLICATIONS: None  INDICATION: The patient, Cheryl [name], is a 42 y.o. female born on 08/13/1976, is here for treatment of breast asymmetry.  She underwent bilateral mastectomies with reconstruction via implant placement.  She has some residual asymmetry as can happen with time.     PROCEDURE DETAILS:  The patient was seen prior to surgery and marked.  The IV antibiotics were given. The patient was taken to the operating room and given a general anesthetic. A standard time out was performed and all information was confirmed by those in the room. SCDs were placed.   The chest and abdomen were prepped and draped.  The local was placed at the entry point hear the umbilicus and at the lateral breast incisions for intraoperative hemostasis and postoperative pain control.  The tumescent was then infused into the fat layers of the abdominal area and lateral right breast.  After waiting for the local to take effect liposuction of the abdomen was performed for harvesting of the fat.  The Revolve device was utilized for collection.  The manufacture guidelines were used to prepare the fat.  While the fat was being washed the lateral right breast underwent liposuction for improved contour laterally.  The picked fork was used to release the later superior aspect of the right breast and lateral aspect of the left breast.  Once ready the fat was injected / placed into the breast right medial and superior aspect for improved contour.  A total of 130 cc was placed.  The left medial and lateral breast had 90 cc of fat placed.  The  incisions were closed with the 5-0 Monocryl.  Derma bond was applied.  The patient was placed in an abdominal binder and breast binder with sterile gauze. The patient was allowed to wake up and taken to recovery room in stable condition at the end of the case. The family was notified at the end of the case.

## 2018-05-20 NOTE — Anesthesia Preprocedure Evaluation (Addendum)
Anesthesia Evaluation  Patient identified by MRN, date of birth, ID band Patient awake    Reviewed: Allergy & Precautions, NPO status , Patient's Chart, lab work & pertinent test results  Airway Mallampati: II  TM Distance: >3 FB Neck ROM: Full    Dental  (+) Teeth Intact, Dental Advisory Given   Pulmonary neg pulmonary ROS,    Pulmonary exam normal breath sounds clear to auscultation       Cardiovascular Exercise Tolerance: Good negative cardio ROS Normal cardiovascular exam Rhythm:Regular Rate:Normal     Neuro/Psych negative neurological ROS     GI/Hepatic negative GI ROS, Neg liver ROS,   Endo/Other  Obesity   Renal/GU negative Renal ROS     Musculoskeletal negative musculoskeletal ROS (+)   Abdominal   Peds  Hematology negative hematology ROS (+)   Anesthesia Other Findings Day of surgery medications reviewed with the patient.  Left breast cancer s/p bilateral mastectomy  Reproductive/Obstetrics negative OB ROS                             Anesthesia Physical Anesthesia Plan  ASA: II  Anesthesia Plan: General   Post-op Pain Management:    Induction: Intravenous  PONV Risk Score and Plan: 3 and Midazolam, Dexamethasone and Ondansetron  Airway Management Planned: Oral ETT  Additional Equipment:   Intra-op Plan:   Post-operative Plan: Extubation in OR  Informed Consent: I have reviewed the patients History and Physical, chart, labs and discussed the procedure including the risks, benefits and alternatives for the proposed anesthesia with the patient or authorized representative who has indicated his/her understanding and acceptance.   Dental advisory given  Plan Discussed with: CRNA  Anesthesia Plan Comments:        Anesthesia Quick Evaluation

## 2018-05-23 ENCOUNTER — Encounter (HOSPITAL_BASED_OUTPATIENT_CLINIC_OR_DEPARTMENT_OTHER): Payer: Self-pay | Admitting: Plastic Surgery

## 2018-05-23 DIAGNOSIS — N8501 Benign endometrial hyperplasia: Secondary | ICD-10-CM | POA: Diagnosis not present

## 2018-05-23 DIAGNOSIS — R102 Pelvic and perineal pain: Secondary | ICD-10-CM | POA: Diagnosis not present

## 2018-05-23 DIAGNOSIS — N840 Polyp of corpus uteri: Secondary | ICD-10-CM | POA: Diagnosis not present

## 2018-06-09 ENCOUNTER — Encounter: Payer: Self-pay | Admitting: Family Medicine

## 2018-06-27 ENCOUNTER — Encounter: Payer: Self-pay | Admitting: Oncology

## 2018-07-14 DIAGNOSIS — N939 Abnormal uterine and vaginal bleeding, unspecified: Secondary | ICD-10-CM | POA: Diagnosis not present

## 2018-07-22 NOTE — Patient Instructions (Addendum)
Your procedure is scheduled on:  Wednesday, September 18  Enter through the [employer] of [facility] at: 7 am  Pick up the phone at the desk and dial [phone].  Call this number if you have problems the morning of surgery: [phone].  Remember: Do NOT eat food or Do NOT drink clear liquids (including water) after midnight Tuesday.  Take these medicines the morning of surgery with a SIP OF WATER: anastrozole  Brush you teeth the morning of surgery.  Stop herbal medications, vitamin supplements, Ibuprofen/NSAIDS at this time.  Do NOT wear jewelry (body piercing), metal hair clips/bobby pins, make-up, or nail polish. Do NOT wear lotions, powders, or perfumes.  You may wear deoderant. Do NOT shave for 48 hours prior to surgery. Do NOT bring valuables to the hospital. Contacts may not be worn into surgery.  Leave suitcase in car.  After surgery it may be brought to your room.  For patients admitted to the hospital, checkout time is 11:00 AM the day of discharge.  Home with boyfriend [name] cell [phone].

## 2018-07-25 DIAGNOSIS — L0390 Cellulitis, unspecified: Secondary | ICD-10-CM

## 2018-07-25 HISTORY — DX: Cellulitis, unspecified: L03.90

## 2018-07-27 DIAGNOSIS — L7634 Postprocedural seroma of skin and subcutaneous tissue following other procedure: Secondary | ICD-10-CM | POA: Diagnosis not present

## 2018-07-27 DIAGNOSIS — Z923 Personal history of irradiation: Secondary | ICD-10-CM | POA: Diagnosis not present

## 2018-07-27 DIAGNOSIS — Z9013 Acquired absence of bilateral breasts and nipples: Secondary | ICD-10-CM | POA: Diagnosis not present

## 2018-07-27 DIAGNOSIS — Z853 Personal history of malignant neoplasm of breast: Secondary | ICD-10-CM | POA: Diagnosis not present

## 2018-07-29 ENCOUNTER — Other Ambulatory Visit: Payer: 59

## 2018-07-29 ENCOUNTER — Other Ambulatory Visit: Payer: Self-pay

## 2018-07-29 DIAGNOSIS — R7302 Impaired glucose tolerance (oral): Secondary | ICD-10-CM

## 2018-07-29 DIAGNOSIS — E559 Vitamin D deficiency, unspecified: Secondary | ICD-10-CM

## 2018-07-29 NOTE — Progress Notes (Signed)
a1c

## 2018-07-30 LAB — HEMOGLOBIN A1C
Est. average glucose Bld gHb Est-mCnc: 131 mg/dL
Hgb A1c MFr Bld: 6.2 % — ABNORMAL HIGH (ref 4.8–5.6)

## 2018-07-30 LAB — VITAMIN D 25 HYDROXY (VIT D DEFICIENCY, FRACTURES): Vit D, 25-Hydroxy: 37 ng/mL (ref 30.0–100.0)

## 2018-08-01 ENCOUNTER — Encounter: Payer: Self-pay | Admitting: Oncology

## 2018-08-03 ENCOUNTER — Other Ambulatory Visit: Payer: Self-pay

## 2018-08-03 ENCOUNTER — Encounter (HOSPITAL_COMMUNITY)
Admission: RE | Admit: 2018-08-03 | Discharge: 2018-08-03 | Disposition: A | Discharge status: Home / Self Care | Payer: 59 | Source: Ambulatory Visit | Attending: Obstetrics and Gynecology | Admitting: Obstetrics and Gynecology

## 2018-08-03 ENCOUNTER — Encounter (HOSPITAL_COMMUNITY): Payer: Self-pay

## 2018-08-03 DIAGNOSIS — N939 Abnormal uterine and vaginal bleeding, unspecified: Secondary | ICD-10-CM | POA: Insufficient documentation

## 2018-08-03 DIAGNOSIS — Z01812 Encounter for preprocedural laboratory examination: Secondary | ICD-10-CM | POA: Diagnosis not present

## 2018-08-03 DIAGNOSIS — C50919 Malignant neoplasm of unspecified site of unspecified female breast: Secondary | ICD-10-CM | POA: Insufficient documentation

## 2018-08-03 HISTORY — DX: Prediabetes: R73.03

## 2018-08-03 LAB — CBC
HCT: 35.4 % — ABNORMAL LOW (ref 36.0–46.0)
Hemoglobin: 11.8 g/dL — ABNORMAL LOW (ref 12.0–15.0)
MCH: 29.9 pg (ref 26.0–34.0)
MCHC: 33.3 g/dL (ref 30.0–36.0)
MCV: 89.6 fL (ref 78.0–100.0)
PLATELETS: 380 10*3/uL (ref 150–400)
RBC: 3.95 MIL/uL (ref 3.87–5.11)
RDW: 13 % (ref 11.5–15.5)
WBC: 6.7 10*3/uL (ref 4.0–10.5)

## 2018-08-03 LAB — BASIC METABOLIC PANEL
ANION GAP: 11 (ref 5–15)
BUN: 13 mg/dL (ref 6–20)
CO2: 23 mmol/L (ref 22–32)
Calcium: 9.2 mg/dL (ref 8.9–10.3)
Chloride: 102 mmol/L (ref 98–111)
Creatinine, Ser: 0.84 mg/dL (ref 0.44–1.00)
GFR calc Af Amer: >60 mL/min (ref >60)
GLUCOSE: 152 mg/dL — AB (ref 70–99)
POTASSIUM: 4.6 mmol/L (ref 3.5–5.1)
Sodium: 136 mmol/L (ref 135–145)

## 2018-08-03 LAB — TYPE AND SCREEN
ABO/RH(D): A POS
Antibody Screen: NEGATIVE

## 2018-08-03 LAB — ABO/RH: ABO/RH(D): A POS

## 2018-08-03 MED ORDER — CEFAZOLIN SODIUM-DEXTROSE 2-4 GM/100ML-% IV SOLN: 2.0000 g | INTRAVENOUS | Status: DC

## 2018-08-03 MED ORDER — LACTATED RINGERS IV SOLN: INTRAVENOUS | Status: DC

## 2018-08-05 ENCOUNTER — Ambulatory Visit: Payer: 59 | Admitting: Family Medicine

## 2018-08-08 ENCOUNTER — Telehealth: Payer: Self-pay | Admitting: *Deleted

## 2018-08-08 ENCOUNTER — Inpatient Hospital Stay: Payer: 59 | Attending: Oncology

## 2018-08-08 ENCOUNTER — Inpatient Hospital Stay: Payer: 59

## 2018-08-08 ENCOUNTER — Telehealth: Payer: Self-pay | Admitting: Oncology

## 2018-08-08 VITALS — BP 128/88 | HR 91 | Temp 98.5°F | Resp 16

## 2018-08-08 DIAGNOSIS — C50212 Malignant neoplasm of upper-inner quadrant of left female breast: Secondary | ICD-10-CM | POA: Insufficient documentation

## 2018-08-08 DIAGNOSIS — R509 Fever, unspecified: Secondary | ICD-10-CM

## 2018-08-08 DIAGNOSIS — L03818 Cellulitis of other sites: Secondary | ICD-10-CM

## 2018-08-08 LAB — CMP (CANCER CENTER ONLY)
ALT: 16 U/L (ref 0–44)
AST: 14 U/L — AB (ref 15–41)
Albumin: 3.8 g/dL (ref 3.5–5.0)
Alkaline Phosphatase: 110 U/L (ref 38–126)
Anion gap: 10 (ref 5–15)
BUN: 8 mg/dL (ref 6–20)
CHLORIDE: 104 mmol/L (ref 98–111)
CO2: 27 mmol/L (ref 22–32)
Calcium: 9.9 mg/dL (ref 8.9–10.3)
Creatinine: 0.81 mg/dL (ref 0.44–1.00)
Glucose, Bld: 91 mg/dL (ref 70–99)
Potassium: 3.9 mmol/L (ref 3.5–5.1)
Sodium: 141 mmol/L (ref 135–145)
TOTAL PROTEIN: 7.8 g/dL (ref 6.5–8.1)
Total Bilirubin: 0.6 mg/dL (ref 0.3–1.2)

## 2018-08-08 LAB — URINALYSIS, COMPLETE (UACMP) WITH MICROSCOPIC
Bacteria, UA: NONE SEEN
Bilirubin Urine: NEGATIVE
GLUCOSE, UA: NEGATIVE mg/dL
KETONES UR: NEGATIVE mg/dL
Nitrite: NEGATIVE
PROTEIN: NEGATIVE mg/dL
Specific Gravity, Urine: 1.013 (ref 1.005–1.030)
pH: 6 (ref 5.0–8.0)

## 2018-08-08 LAB — CBC WITH DIFFERENTIAL (CANCER CENTER ONLY)
BASOS ABS: 0 10*3/uL (ref 0.0–0.1)
Basophils Relative: 0 %
EOS PCT: 2 %
Eosinophils Absolute: 0.1 10*3/uL (ref 0.0–0.5)
HEMATOCRIT: 37.7 % (ref 34.8–46.6)
Hemoglobin: 12.4 g/dL (ref 11.6–15.9)
LYMPHS ABS: 2.1 10*3/uL (ref 0.9–3.3)
LYMPHS PCT: 28 %
MCH: 29.7 pg (ref 25.1–34.0)
MCHC: 32.9 g/dL (ref 31.5–36.0)
MCV: 90.4 fL (ref 79.5–101.0)
Monocytes Absolute: 0.5 10*3/uL (ref 0.1–0.9)
Monocytes Relative: 7 %
NEUTROS ABS: 4.8 10*3/uL (ref 1.5–6.5)
Neutrophils Relative %: 63 %
Platelet Count: 348 10*3/uL (ref 145–400)
RBC: 4.17 MIL/uL (ref 3.70–5.45)
RDW: 13.1 % (ref 11.2–14.5)
WBC: 7.7 10*3/uL (ref 3.9–10.3)

## 2018-08-08 MED ORDER — LEVOFLOXACIN 500 MG PO TABS: 500.0000 mg | ORAL_TABLET | Freq: Every day | ORAL | 0 refills | Status: DC

## 2018-08-08 MED ORDER — VANCOMYCIN HCL 1000 MG IV SOLR
1000.0000 mg | Freq: Once | INTRAVENOUS | Status: AC
  Administered 2018-08-08: 1000 mg via INTRAVENOUS
  Filled 2018-08-08: qty 1000

## 2018-08-08 NOTE — Telephone Encounter (Signed)
This RN assessed pt per concern with area of increased warmth and firmness in her left breast while she was getting IV Vancomycin in infusion area.  Left breast is hard, firm and smooth compared to right breast  from mid breast surgical site down and then to the outside of her breast. Surgical wounds are well adhered. No focal redness noted though left breast in general is more red then the right breast.   No pitting, no dimpling , no nodules felt by this RN.  Above reviewed with covering MD, Dr Lindi Adie, as well as lab results from today including a U/A with abnormality.  Per Dr Lindi Adie - levaquin prescription sent to pt's pharmacy.  This RN called and spoke with intake at Dr Nena Alexander office per scheduled BSO with hysterectomy scheduled for this Wednesday.  All the above discussed with the patient as well as copy of labs and written information regarding name of antibiotic.

## 2018-08-08 NOTE — Patient Instructions (Signed)
Vancomycin injection What is this medicine? VANCOMYCIN (van koe MYE sin) is a glycopeptide antibiotic. It is used to treat certain kinds of bacterial infections. It will not work for colds, flu, or other viral infections. This medicine may be used for other purposes; ask your health care provider or pharmacist if you have questions. COMMON BRAND NAME(S): Vancocin What should I tell my health care provider before I take this medicine? They need to know if you have any of these conditions: -dehydration -hearing loss -kidney disease -other chronic illness -an unusual or allergic reaction to vancomycin, other medicines, foods, dyes, or preservatives -pregnant or trying to get pregnant -breast-feeding How should I use this medicine? This medicine is infused into a vein. It is usually given by a health care provider in a hospital or clinic. If you receive this medicine at home, you will receive special instructions. Take your medicine at regular intervals. Do not take your medicine more often than directed. Take all of your medicine as directed even if you think you are better. Do not skip doses or stop your medicine early. It is important that you put your used needles and syringes in a special sharps container. Do not put them in a trash can. If you do not have a sharps container, call your pharmacist or healthcare provider to get one. Talk to your pediatrician regarding the use of this medicine in children. While this drug may be prescribed for even very young infants for selected conditions, precautions do apply. Overdosage: If you think you have taken too much of this medicine contact a poison control center or emergency room at once. NOTE: This medicine is only for you. Do not share this medicine with others. What if I miss a dose? If you miss a dose, take it as soon as you can. If it is almost time for your next dose, take only that dose. Do not take double or extra doses. What may interact  with this medicine? -amphotericin B -anesthetics -bacitracin -birth control pills -cisplatin -colistin -diuretics -other aminoglycoside antibiotics -polymyxin B This list may not describe all possible interactions. Give your health care provider a list of all the medicines, herbs, non-prescription drugs, or dietary supplements you use. Also tell them if you smoke, drink alcohol, or use illegal drugs. Some items may interact with your medicine. What should I watch for while using this medicine? Tell your doctor or health care professional if your symptoms do not improve or if you get new symptoms. Your condition and lab work will be monitored while you are taking this medicine. Do not treat diarrhea with over the counter products. Contact your doctor if you have diarrhea that lasts more than 2 days or if it is severe and watery. What side effects may I notice from receiving this medicine? Side effects that you should report to your doctor or health care professional as soon as possible: -allergic reactions like skin rash, itching or hives, swelling of the face, lips, or tongue -breathing difficulty, wheezing -change in amount, color of urine -change in hearing -chest pain -dizziness -fever, chills -flushing of the face and neck (reddening) -low blood pressure -redness, blistering, peeling or loosening of the skin, including inside the mouth -unusual bleeding or bruising -unusually weak or tired Side effects that usually do not require medical attention (report to your doctor or health care professional if they continue or are bothersome): -nausea, vomiting -pain, swelling where injected -stomach cramps This list may not describe all possible side effects.   Call your doctor for medical advice about side effects. You may report side effects to FDA at 1-800-FDA-1088. Where should I keep my medicine? Keep out of the reach of children. You will be instructed on how to store this medicine,  if needed. Throw away any unused medicine after the expiration date on the label. NOTE: This sheet is a summary. It may not cover all possible information. If you have questions about this medicine, talk to your doctor, pharmacist, or health care provider.  2018 Elsevier/Gold Standard (2013-06-16 14:46:02)  

## 2018-08-08 NOTE — Telephone Encounter (Signed)
This RN spoke with pt per her call stating she completed antibiotic per Psychiatric nurse.  She states she continues to have an area of firmness that is concerning her.  She states she had a low grade temp over the weekend with temp last night of 100.7 - she took tylenol. Presently she is at work.  Note pt had reconstruction under Dr Marla Roe - who is currently not available due to out of her office. Francy was seen by covering MD with recommendation to follow up in a week but Leticia is very reluctant to return due to her feeling of " I just didn't find her helpful ".  Caiden is due to have surgery this Wednesday for BSO with hysterectomy under Dr Marvel Plan.  Per covering MD - recommended pt to come in today for labs and IV Vancomycin.  Appointment obtained - pt aware including need to inform Dr Marvel Plan of above.  Orders obtained from MD for above request.

## 2018-08-08 NOTE — Telephone Encounter (Signed)
Pt sched per 9/16 sch message.pt aware of d&t.

## 2018-08-09 LAB — URINE CULTURE: Culture: 50000 — AB

## 2018-08-13 LAB — CULTURE, BLOOD (SINGLE): Culture: NO GROWTH

## 2018-08-15 ENCOUNTER — Encounter: Payer: Self-pay | Admitting: Adult Health

## 2018-08-15 ENCOUNTER — Encounter: Payer: Self-pay | Admitting: Family Medicine

## 2018-08-18 DIAGNOSIS — C50912 Malignant neoplasm of unspecified site of left female breast: Secondary | ICD-10-CM | POA: Diagnosis not present

## 2018-08-18 DIAGNOSIS — Z9011 Acquired absence of right breast and nipple: Secondary | ICD-10-CM | POA: Diagnosis not present

## 2018-08-23 ENCOUNTER — Ambulatory Visit: Payer: 59 | Admitting: Plastic Surgery

## 2018-08-26 NOTE — Patient Instructions (Addendum)
Your procedure is scheduled on  09-08-18  Report to [place] AM   Call this number if you have problems the morning of [name]  :[phone].   OUR ADDRESS IS [address].  WE ARE LOCATED IN THE [place]                                   [facility].                                     REMEMBER:  DO NOT EAT FOOD OR DRINK LIQUIDS AFTER MIDNIGHT .  TAKE THESE MEDICATIONS MORNING OF [name] WITH A SIP OF WATER: ARMIDEX                                    DO NOT WEAR JEWERLY, MAKE UP, OR NAIL POLISH,  DO NOT WEAR LOTIONS, POWDERS, PERFUMES OR DEODORANT. DO NOT SHAVE FOR 24 HOURS PRIOR TO DAY OF [name]. MEN MAY SHAVE FACE AND NECK. CONTACTS, GLASSES, OR DENTURES MAY NOT BE WORN TO [name].                                    [facility] IS NOT RESPONSIBLE  FOR ANY BELONGINGS.                                                                    Cheryl [name]      [facility] - Preparing for [name] Before [name], you can play an important role.  Because skin is not sterile, your skin needs to be as free of germs as possible.  You can reduce the number of germs on your skin by washing with CHG (chlorahexidine gluconate) soap before [name].  CHG is an antiseptic cleaner which kills germs and bonds with the skin to continue killing germs even after washing. Please DO NOT use if you have an allergy to CHG or antibacterial soaps.  If your skin becomes reddened/irritated stop using the CHG and inform your nurse when you arrive at Short Stay. Do not shave (including legs and underarms) for at least 48 hours prior to the first CHG shower.  You may shave your face/neck. Please follow these instructions carefully:  1.  Shower with CHG Soap the night before [name] and the  morning of [name].  2.  If you choose to wash your hair, wash your hair first as usual with your  normal  shampoo.  3.  After you shampoo, rinse your hair and body thoroughly to remove the  shampoo.                            4.  Use CHG as you would any other liquid soap.  You can apply chg directly  to the skin and wash  Gently with a scrungie or clean washcloth.  5.  Apply the CHG Soap to your body ONLY FROM THE NECK DOWN.   Do not use on face/ open                           Wound or open sores. Avoid contact with eyes, ears mouth and genitals (private parts).                       Wash face,  Genitals (private parts) with your normal soap.             6.  Wash thoroughly, paying special attention to the area where your [name]  will be performed.  7.  Thoroughly rinse your body with warm water from the neck down.  8.  DO NOT shower/wash with your normal soap after using and rinsing off  the CHG Soap.                9.  Pat yourself dry with a clean towel.            10.  Wear clean pajamas.            11.  Place clean sheets on your bed the night of your first shower and do not  sleep with pets. Day of [name] : Do not apply any lotions/deodorants the morning of [name].  Please wear clean clothes to the hospital/[name] center.  FAILURE TO FOLLOW THESE INSTRUCTIONS MAY RESULT IN THE CANCELLATION OF YOUR [name] PATIENT SIGNATURE_________________________________  NURSE SIGNATURE__________________________________  ________________________________________________________________________                                     Cheryl [name]  An incentive spirometer is a tool that can help keep your lungs clear and active. This tool measures how well you are filling your lungs with each breath. Taking long deep breaths may help reverse or decrease the chance of developing breathing (pulmonary) problems (especially infection) following:  A long period of time when you are unable to move or be active. BEFORE THE PROCEDURE   If the spirometer includes an indicator to show your best effort, your nurse or respiratory therapist will set it to a desired goal.  If possible,  sit up straight or lean slightly forward. Try not to slouch.  Hold the incentive spirometer in an upright position. INSTRUCTIONS FOR USE  1. Sit on the edge of your bed if possible, or sit up as far as you can in bed or on a chair. 2. Hold the incentive spirometer in an upright position. 3. Breathe out normally. 4. Place the mouthpiece in your mouth and seal your lips tightly around it. 5. Breathe in slowly and as deeply as possible, raising the piston or the ball toward the top of the column. 6. Hold your breath for 3-5 seconds or for as long as possible. Allow the piston or ball to fall to the bottom of the column. 7. Remove the mouthpiece from your mouth and breathe out normally. 8. Rest for a few seconds and repeat Steps 1 through 7 at least 10 times every 1-2 hours when you are awake. Take your time and take a few normal breaths between deep breaths. 9. The spirometer may include an indicator to show your best effort. Use the indicator as a goal to work  toward during each repetition. 10. After each set of 10 deep breaths, practice coughing to be sure your lungs are clear. If you have an incision (the cut made at the time of [name]), support your incision when coughing by placing a pillow or rolled up towels firmly against it. Once you are able to get out of bed, walk around indoors and cough well. You may stop using the incentive spirometer when instructed by your caregiver.  RISKS AND COMPLICATIONS  Take your time so you do not get dizzy or light-headed.  If you are in pain, you may need to take or ask for pain medication before doing incentive spirometry. It is harder to take a deep breath if you are having pain. AFTER USE  Rest and breathe slowly and easily.  It can be helpful to keep track of a log of your progress. Your caregiver can provide you with a simple table to help with this. If you are using the spirometer at home, follow these instructions: [place] IF:   You  are having difficultly using the spirometer.  You have trouble using the spirometer as often as instructed.  Your pain medication is not giving enough relief while using the spirometer.  You develop fever of 100.5 F (38.1 C) or higher. SEEK IMMEDIATE MEDICAL CARE IF:   You cough up bloody sputum that had not been present before.  You develop fever of 102 F (38.9 C) or greater.  You develop worsening pain at or near the incision site. MAKE SURE YOU:   Understand these instructions.  Will watch your condition.  Will get help right away if you are not doing well or get worse. Document Released: 03/22/2007 Document Revised: 02/01/2012 Document Reviewed: 05/23/2007 [facility] Patient Information 2014 [place], [place].   ________________________________________________________________________

## 2018-08-26 NOTE — Progress Notes (Signed)
HEMAGLOBIN A1C 07-29-18 EPIC

## 2018-09-01 ENCOUNTER — Other Ambulatory Visit: Payer: Self-pay

## 2018-09-01 ENCOUNTER — Encounter (HOSPITAL_COMMUNITY)
Admission: RE | Admit: 2018-09-01 | Discharge: 2018-09-01 | Disposition: A | Discharge status: Home / Self Care | Payer: 59 | Source: Ambulatory Visit | Attending: Obstetrics and Gynecology | Admitting: Obstetrics and Gynecology

## 2018-09-01 ENCOUNTER — Encounter (HOSPITAL_COMMUNITY): Payer: Self-pay

## 2018-09-01 DIAGNOSIS — Z01812 Encounter for preprocedural laboratory examination: Secondary | ICD-10-CM | POA: Insufficient documentation

## 2018-09-01 HISTORY — DX: Cellulitis, unspecified: L03.90

## 2018-09-01 LAB — CBC
HEMATOCRIT: 38.5 % (ref 36.0–46.0)
Hemoglobin: 12.3 g/dL (ref 12.0–15.0)
MCH: 29.1 pg (ref 26.0–34.0)
MCHC: 31.9 g/dL (ref 30.0–36.0)
MCV: 91.2 fL (ref 80.0–100.0)
NRBC: 0 % (ref 0.0–0.2)
PLATELETS: 349 10*3/uL (ref 150–400)
RBC: 4.22 MIL/uL (ref 3.87–5.11)
RDW: 13.2 % (ref 11.5–15.5)
WBC: 5.8 10*3/uL (ref 4.0–10.5)

## 2018-09-01 LAB — BASIC METABOLIC PANEL
ANION GAP: 11 (ref 5–15)
BUN: 13 mg/dL (ref 6–20)
CALCIUM: 9.7 mg/dL (ref 8.9–10.3)
CHLORIDE: 106 mmol/L (ref 98–111)
CO2: 26 mmol/L (ref 22–32)
Creatinine, Ser: 0.85 mg/dL (ref 0.44–1.00)
GFR calc non Af Amer: >60 mL/min (ref >60)
Glucose, Bld: 139 mg/dL — ABNORMAL HIGH (ref 70–99)
Potassium: 4.1 mmol/L (ref 3.5–5.1)
Sodium: 143 mmol/L (ref 135–145)

## 2018-09-01 NOTE — Progress Notes (Signed)
TEDS ARE THIGH HIGH PER HANNAH AT DR Marvel Plan OFFICE

## 2018-09-07 NOTE — H&P (Signed)
Cheryl [name] is an 42 y.o. female [phone] presents for scheduled  LAVH/BSO for a 1.3 cm polyp and h/o breast cancer x 2. Pt had an EMB and SIUS given thickened endometrium noted on recent US at [facility] for pelvic pain. They noted an endometrial lining that was 8.4 mm in the setting of menopause induced by her chemotherapy.Marland KitchenShe was on tamoxifen about 6 months and switched to Arimidex 2 1/2 years ago. [name] on effexor to control menopausal sx and FSH was 127 7/19.  At 42 y/o she is aware that ovarian function could still return. [name] interested in ovarian removal to further reduce her risk and at the same time would like a hysterectomy as she will likely be on longterm oral chemo. She was diagnosed with triple negative breast cancer in 03/2015. (chemo, radiation) then ER/PR positive DCIS 2018 and had double mastectomy. She had repeat chemo due to some areas of possible microinvasion and tolerated well. [name] now on arimidex, because got the latest breast cancer on tamoxifen.    Pertinent Gynecological History: OB History: NSVD x 3                     14 week demise, NSVD                     SAB x 1   Menstrual History:  Patient's last menstrual period was 04/02/2015 (approximate).    Past Medical History:  Diagnosis Date  . Breast cancer ([place]) 2016 and 2018   left breast, surgery and chemo done 2018  . Cellulitis 07/25/2018   left breast  . Pre-diabetes   . SVD (spontaneous vaginal delivery)    x 3  . Wears contact lenses     Past Surgical History:  Procedure Laterality Date  . BILATERAL TOTAL MASTECTOMY WITH AXILLARY LYMPH NODE DISSECTION  04/20/2017  . BREAST BIOPSY Left 02/18/2015  . BREAST BIOPSY Right 02/18/2015  . BREAST EXCISIONAL BIOPSY Left 04/05/2015  . BREAST LUMPECTOMY Left 03/27/2015  . BREAST LUMPECTOMY WITH NEEDLE LOCALIZATION AND AXILLARY SENTINEL LYMPH NODE BX Left 03/27/2015   Procedure: LEFT BREAST LUMPECTOMY WITH BRACKETED NEEDLE LOCALIZATION AND LEFT   AXILLARY SENTINEL LYMPH NODE BX;  Surgeon: [name], MD;  Location: [place];  Service: General;  Laterality: Left;  . BREAST RECONSTRUCTION WITH PLACEMENT OF TISSUE EXPANDER AND FLEX HD (ACELLULAR HYDRATED DERMIS) Bilateral 04/20/2017   Procedure: BILATARAL BREAST RECONSTRUCTION WITH PLACEMENT OF TISSUE EXPANDER AND FLEX HD (ACELLULAR HYDRATED DERMIS);  Surgeon: Wallace Going, DO;  Location: [place];  Service: Plastics;  Laterality: Bilateral;  . BREAST SURGERY Bilateral    masectomy  . CHOLECYSTECTOMY N/A 07/07/2017   Procedure: LAPAROSCOPIC CHOLECYSTECTOMY WITH INTRAOPERATIVE CHOLANGIOGRAM;  Surgeon: [name], MD;  Location: [facility];  Service: General;  Laterality: N/A;  . Insertion of Essure  yrs ago   For Birth control  . LIPOSUCTION WITH LIPOFILLING Bilateral 05/20/2018   Procedure: Bilateral breast fat grafting from abdomen to improve symmetry following breast reconstruction;  Surgeon: Wallace Going, DO;  Location: [place];  Service: Plastics;  Laterality: Bilateral;  . MASTECTOMY W/ SENTINEL NODE BIOPSY Bilateral 04/20/2017   Procedure: BILATERAL TOTAL MASTECTOMY WITH LEFT AXILLARY SENTINEL LYMPH NODE BIOPSY;  Surgeon: [name], MD;  Location: [place];  Service: General;  Laterality: Bilateral;  . PORTA CATH REMOVAL Right 11/04/2017   Procedure: PORTA CATH REMOVAL;  Surgeon: Wallace Going, DO;  Location:  [place];  Service: Plastics;  Laterality: Right;  . PORTACATH PLACEMENT Right 04/25/2015   Procedure: INSERTION PORT-A-CATH;  Surgeon: [name], MD;  Location: [place];  Service: General;  Laterality: Right;, renoved in 2016  . PORTACATH PLACEMENT Right 04/20/2017   Procedure: INSERTION PORT-A-CATH;  Surgeon: [name], MD;  Location: [place];  Service: General;  Laterality: Right;  . RE-EXCISION OF BREAST LUMPECTOMY Left 04/05/2015   Procedure: RE-EXCISION LEFT   BREAST LUMPECTOMY;  Surgeon: [name], MD;  Location: [place];  Service: General;  Laterality: Left;  . REMOVAL OF TISSUE EXPANDER AND PLACEMENT OF IMPLANT Bilateral 11/04/2017   Procedure: BILATERAL REMOVAL OF TISSUE EXPANDER AND PLACEMENT OF  BILATERAL BREAST IMPLANT;  Surgeon: Wallace Going, DO;  Location: [place];  Service: Plastics;  Laterality: Bilateral;    Family History  Problem Relation Age of Onset  . Lung cancer Maternal Grandmother 25       non smoker  . Lung cancer Maternal Grandfather 23       non smoker worked in [employer]  . Cancer Paternal Grandfather 35       throat cancer ? smoker  . Cancer Cousin 41       throat cancer ? smoker    Social History:  reports that she has never smoked. She has never used smokeless tobacco. She reports that she drinks alcohol. She reports that she does not use drugs.  Allergies:  Allergies  Allergen Reactions  . Adhesive [Tape] Other (See Comments)    Band Aids causes Skin blisters.  All other tape is okay to use    No medications prior to admission.    Review of Systems  Constitutional: Negative for fever.  Gastrointestinal: Negative for abdominal pain.  Genitourinary: Negative for flank pain.    Last menstrual period 04/02/2015. Physical Exam  Constitutional: She appears well-developed.  Cardiovascular: Normal rate and regular rhythm.  Respiratory: Effort normal.  GI: Soft.  Genitourinary: Vagina normal and uterus normal.  Neurological: [name] alert.  Psychiatric: She has a normal mood and affect.    No results found for this or any previous visit (from the past 24 hour(s)).  No results found.  Assessment/Plan: The patient was counseled regarding the risks of laparoscopic assisted vaginal hysterectomy with BSO. The procedure was reviewed in detail and expectations regarding recovery. Risks of bleeding, infection and possible damage to bowel and bladder were  reviewed. The patient understands that should a complication arise she would likely need a larger abdominal incision and that this would delay her recovery. She would accept a blood transfusion if needed. We also discussed removal of the fallopian tubes as a means of possibly reducing future risk of ovarian cancer and she is agreeable to this. She definitely would like to remove her ovaries given her recent estrogen receptor positive breast cancer.  [name] ready to proceed. Logan Bores 09/07/2018, 7:25 PM

## 2018-09-07 NOTE — Anesthesia Preprocedure Evaluation (Addendum)
Anesthesia Evaluation  Patient identified by MRN, date of birth, ID band Patient awake    Reviewed: Allergy & Precautions, NPO status , Patient's Chart, lab work & pertinent test results  History of Anesthesia Complications Negative for: history of anesthetic complications  Airway Mallampati: II  TM Distance: >3 FB Neck ROM: Full    Dental  (+) Teeth Intact, Dental Advisory Given   Pulmonary neg pulmonary ROS,    breath sounds clear to auscultation       Cardiovascular Exercise Tolerance: Good negative cardio ROS   Rhythm:Regular Rate:Normal     Neuro/Psych  Neuromuscular disease    GI/Hepatic negative GI ROS, Neg liver ROS,   Endo/Other  Morbid obesity   Renal/GU negative Renal ROS     Musculoskeletal negative musculoskeletal ROS (+)   Abdominal   Peds  Hematology negative hematology ROS (+)   Anesthesia Other Findings   Reproductive/Obstetrics                            Anesthesia Physical  Anesthesia Plan  ASA: II  Anesthesia Plan: General   Post-op Pain Management:    Induction: Intravenous  PONV Risk Score and Plan: 3 and Dexamethasone, Ondansetron, Scopolamine patch - Pre-op and Treatment may vary due to age or medical condition  Airway Management Planned: Oral ETT  Additional Equipment:   Intra-op Plan:   Post-operative Plan: Extubation in OR  Informed Consent: I have reviewed the patients History and Physical, chart, labs and discussed the procedure including the risks, benefits and alternatives for the proposed anesthesia with the patient or authorized representative who has indicated his/her understanding and acceptance.   Dental advisory given  Plan Discussed with: CRNA  Anesthesia Plan Comments:        Anesthesia Quick Evaluation

## 2018-09-08 ENCOUNTER — Ambulatory Visit (HOSPITAL_BASED_OUTPATIENT_CLINIC_OR_DEPARTMENT_OTHER): Payer: 59 | Admitting: Anesthesiology

## 2018-09-08 ENCOUNTER — Encounter (HOSPITAL_BASED_OUTPATIENT_CLINIC_OR_DEPARTMENT_OTHER): Payer: Self-pay

## 2018-09-08 ENCOUNTER — Ambulatory Visit (HOSPITAL_BASED_OUTPATIENT_CLINIC_OR_DEPARTMENT_OTHER)
Admission: AD | Admit: 2018-09-08 | Discharge: 2018-09-08 | Disposition: A | Discharge status: Home / Self Care | Payer: 59 | Source: Ambulatory Visit | Attending: Obstetrics and Gynecology | Admitting: Obstetrics and Gynecology

## 2018-09-08 ENCOUNTER — Encounter (HOSPITAL_BASED_OUTPATIENT_CLINIC_OR_DEPARTMENT_OTHER)
Admission: AD | Disposition: A | Discharge status: Home / Self Care | Payer: Self-pay | Source: Ambulatory Visit | Attending: Obstetrics and Gynecology

## 2018-09-08 DIAGNOSIS — Z79899 Other long term (current) drug therapy: Secondary | ICD-10-CM | POA: Diagnosis not present

## 2018-09-08 DIAGNOSIS — Z90710 Acquired absence of both cervix and uterus: Secondary | ICD-10-CM | POA: Diagnosis present

## 2018-09-08 DIAGNOSIS — Z853 Personal history of malignant neoplasm of breast: Secondary | ICD-10-CM | POA: Diagnosis not present

## 2018-09-08 DIAGNOSIS — N840 Polyp of corpus uteri: Secondary | ICD-10-CM | POA: Insufficient documentation

## 2018-09-08 DIAGNOSIS — R7303 Prediabetes: Secondary | ICD-10-CM | POA: Insufficient documentation

## 2018-09-08 DIAGNOSIS — N800 Endometriosis of uterus: Secondary | ICD-10-CM | POA: Insufficient documentation

## 2018-09-08 DIAGNOSIS — R102 Pelvic and perineal pain: Secondary | ICD-10-CM | POA: Diagnosis not present

## 2018-09-08 DIAGNOSIS — C50919 Malignant neoplasm of unspecified site of unspecified female breast: Secondary | ICD-10-CM | POA: Diagnosis not present

## 2018-09-08 HISTORY — DX: Acquired absence of both cervix and uterus: Z90.710

## 2018-09-08 HISTORY — PX: LAPAROSCOPIC VAGINAL HYSTERECTOMY WITH SALPINGO OOPHORECTOMY: SHX6681

## 2018-09-08 LAB — POCT PREGNANCY, URINE: Preg Test, Ur: NEGATIVE

## 2018-09-08 SURGERY — HYSTERECTOMY, VAGINAL, LAPAROSCOPY-ASSISTED, WITH SALPINGO-OOPHORECTOMY
Anesthesia: General | Site: Abdomen | Laterality: Bilateral

## 2018-09-08 MED ORDER — SUGAMMADEX SODIUM 200 MG/2ML IV SOLN
INTRAVENOUS | Status: DC | PRN
  Administered 2018-09-08: 200 mg via INTRAVENOUS

## 2018-09-08 MED ORDER — FENTANYL CITRATE (PF) 100 MCG/2ML IJ SOLN
INTRAMUSCULAR | Status: DC | PRN
  Administered 2018-09-08: 50 ug via INTRAVENOUS
  Administered 2018-09-08 (×3): 100 ug via INTRAVENOUS
  Administered 2018-09-08 (×2): 50 ug via INTRAVENOUS
  Administered 2018-09-08: 100 ug via INTRAVENOUS

## 2018-09-08 MED ORDER — LACTATED RINGERS IV SOLN
INTRAVENOUS | Status: DC
  Administered 2018-09-08 (×2): via INTRAVENOUS
  Filled 2018-09-08: qty 1000

## 2018-09-08 MED ORDER — CEFAZOLIN SODIUM-DEXTROSE 2-4 GM/100ML-% IV SOLN
2.0000 g | INTRAVENOUS | Status: AC
  Administered 2018-09-08: 2 g via INTRAVENOUS
  Filled 2018-09-08: qty 100

## 2018-09-08 MED ORDER — FENTANYL CITRATE (PF) 100 MCG/2ML IJ SOLN
INTRAMUSCULAR | Status: AC
  Filled 2018-09-08: qty 2

## 2018-09-08 MED ORDER — MIDAZOLAM HCL 2 MG/2ML IJ SOLN
INTRAMUSCULAR | Status: AC
  Filled 2018-09-08: qty 2

## 2018-09-08 MED ORDER — BUPIVACAINE HCL (PF) 0.25 % IJ SOLN
INTRAMUSCULAR | Status: DC | PRN
  Administered 2018-09-08: 10 mL

## 2018-09-08 MED ORDER — MAGNESIUM HYDROXIDE 400 MG/5ML PO SUSP
30.0000 mL | Freq: Every day | ORAL | Status: DC | PRN
  Filled 2018-09-08 (×2): qty 30

## 2018-09-08 MED ORDER — IBUPROFEN 200 MG PO TABS
ORAL_TABLET | ORAL | Status: AC
  Filled 2018-09-08: qty 3

## 2018-09-08 MED ORDER — LACTATED RINGERS IV SOLN
INTRAVENOUS | Status: DC
  Administered 2018-09-08: 11:00:00 via INTRAVENOUS
  Filled 2018-09-08: qty 1000

## 2018-09-08 MED ORDER — SCOPOLAMINE 1 MG/3DAYS TD PT72
1.0000 | MEDICATED_PATCH | TRANSDERMAL | Status: DC
  Administered 2018-09-08: 1.5 mg via TRANSDERMAL
  Filled 2018-09-08: qty 1

## 2018-09-08 MED ORDER — ROCURONIUM BROMIDE 100 MG/10ML IV SOLN
INTRAVENOUS | Status: DC | PRN
  Administered 2018-09-08: 10 mg via INTRAVENOUS
  Administered 2018-09-08: 60 mg via INTRAVENOUS

## 2018-09-08 MED ORDER — SCOPOLAMINE 1 MG/3DAYS TD PT72
MEDICATED_PATCH | TRANSDERMAL | Status: AC
  Filled 2018-09-08: qty 1

## 2018-09-08 MED ORDER — PROMETHAZINE HCL 25 MG/ML IJ SOLN
INTRAMUSCULAR | Status: AC
  Filled 2018-09-08: qty 1

## 2018-09-08 MED ORDER — SCOPOLAMINE 1 MG/3DAYS TD PT72
1.0000 | MEDICATED_PATCH | Freq: Once | TRANSDERMAL | Status: DC
  Filled 2018-09-08: qty 1

## 2018-09-08 MED ORDER — KETOROLAC TROMETHAMINE 30 MG/ML IJ SOLN
INTRAMUSCULAR | Status: AC
  Filled 2018-09-08: qty 1

## 2018-09-08 MED ORDER — OXYCODONE HCL 5 MG PO TABS
10.0000 mg | ORAL_TABLET | ORAL | Status: DC | PRN
  Administered 2018-09-08 (×2): 10 mg via ORAL
  Filled 2018-09-08: qty 2

## 2018-09-08 MED ORDER — DEXAMETHASONE SODIUM PHOSPHATE 10 MG/ML IJ SOLN
INTRAMUSCULAR | Status: DC | PRN
  Administered 2018-09-08: 5 mg via INTRAVENOUS

## 2018-09-08 MED ORDER — VASOPRESSIN 20 UNIT/ML IV SOLN
INTRAVENOUS | Status: DC | PRN
  Administered 2018-09-08: 8 mL via INTRAMUSCULAR

## 2018-09-08 MED ORDER — SODIUM CHLORIDE 0.9 % IR SOLN
Status: DC | PRN
  Administered 2018-09-08: 3000 mL

## 2018-09-08 MED ORDER — PROPOFOL 10 MG/ML IV BOLUS
INTRAVENOUS | Status: DC | PRN
  Administered 2018-09-08: 20 mg via INTRAVENOUS
  Administered 2018-09-08: 180 mg via INTRAVENOUS

## 2018-09-08 MED ORDER — FENTANYL CITRATE (PF) 250 MCG/5ML IJ SOLN
INTRAMUSCULAR | Status: AC
  Filled 2018-09-08: qty 5

## 2018-09-08 MED ORDER — MEPERIDINE HCL 25 MG/ML IJ SOLN
INTRAMUSCULAR | Status: AC
  Filled 2018-09-08: qty 1

## 2018-09-08 MED ORDER — FENTANYL CITRATE (PF) 100 MCG/2ML IJ SOLN
25.0000 ug | INTRAMUSCULAR | Status: DC | PRN
  Administered 2018-09-08 (×2): 50 ug via INTRAVENOUS
  Filled 2018-09-08: qty 1

## 2018-09-08 MED ORDER — LIDOCAINE 2% (20 MG/ML) 5 ML SYRINGE
INTRAMUSCULAR | Status: AC
  Filled 2018-09-08: qty 5

## 2018-09-08 MED ORDER — PROPOFOL 10 MG/ML IV BOLUS
INTRAVENOUS | Status: AC
  Filled 2018-09-08: qty 20

## 2018-09-08 MED ORDER — ROCURONIUM BROMIDE 10 MG/ML (PF) SYRINGE
PREFILLED_SYRINGE | INTRAVENOUS | Status: AC
  Filled 2018-09-08: qty 10

## 2018-09-08 MED ORDER — DEXAMETHASONE SODIUM PHOSPHATE 10 MG/ML IJ SOLN
INTRAMUSCULAR | Status: AC
  Filled 2018-09-08: qty 1

## 2018-09-08 MED ORDER — ONDANSETRON HCL 4 MG/2ML IJ SOLN
INTRAMUSCULAR | Status: DC | PRN
  Administered 2018-09-08: 4 mg via INTRAVENOUS

## 2018-09-08 MED ORDER — CEFAZOLIN SODIUM-DEXTROSE 2-4 GM/100ML-% IV SOLN
INTRAVENOUS | Status: AC
  Filled 2018-09-08: qty 100

## 2018-09-08 MED ORDER — ONDANSETRON HCL 4 MG/2ML IJ SOLN
4.0000 mg | Freq: Four times a day (QID) | INTRAMUSCULAR | Status: DC | PRN
  Administered 2018-09-08: 4 mg via INTRAVENOUS
  Filled 2018-09-08: qty 2

## 2018-09-08 MED ORDER — OXYCODONE HCL 10 MG PO TABS: 10.0000 mg | ORAL_TABLET | ORAL | 0 refills | Status: DC | PRN

## 2018-09-08 MED ORDER — SUGAMMADEX SODIUM 200 MG/2ML IV SOLN
INTRAVENOUS | Status: AC
  Filled 2018-09-08: qty 2

## 2018-09-08 MED ORDER — IBUPROFEN 200 MG PO TABS
ORAL_TABLET | ORAL | Status: AC
  Filled 2018-09-08: qty 1

## 2018-09-08 MED ORDER — ONDANSETRON HCL 4 MG/2ML IJ SOLN
INTRAMUSCULAR | Status: AC
  Filled 2018-09-08: qty 2

## 2018-09-08 MED ORDER — ONDANSETRON HCL 4 MG PO TABS
4.0000 mg | ORAL_TABLET | Freq: Four times a day (QID) | ORAL | Status: DC | PRN
  Filled 2018-09-08: qty 1

## 2018-09-08 MED ORDER — MIDAZOLAM HCL 5 MG/5ML IJ SOLN
INTRAMUSCULAR | Status: DC | PRN
  Administered 2018-09-08: 2 mg via INTRAVENOUS

## 2018-09-08 MED ORDER — KETOROLAC TROMETHAMINE 30 MG/ML IJ SOLN
INTRAMUSCULAR | Status: DC | PRN
  Administered 2018-09-08: 30 mg via INTRAVENOUS

## 2018-09-08 MED ORDER — IBUPROFEN 600 MG PO TABS: 600.0000 mg | ORAL_TABLET | Freq: Four times a day (QID) | ORAL | 0 refills | Status: DC | PRN

## 2018-09-08 MED ORDER — MEPERIDINE HCL 25 MG/ML IJ SOLN
6.2500 mg | INTRAMUSCULAR | Status: DC | PRN
  Administered 2018-09-08 (×2): 6.25 mg via INTRAVENOUS
  Filled 2018-09-08: qty 1

## 2018-09-08 MED ORDER — ANASTROZOLE 1 MG PO TABS
1.0000 mg | ORAL_TABLET | Freq: Every day | ORAL | Status: DC
  Administered 2018-09-08: 1 mg via ORAL
  Filled 2018-09-08 (×2): qty 1

## 2018-09-08 MED ORDER — HYDROMORPHONE HCL 1 MG/ML IJ SOLN
0.2000 mg | INTRAMUSCULAR | Status: DC | PRN
  Administered 2018-09-08 (×2): 0.5 mg via INTRAVENOUS
  Filled 2018-09-08: qty 1

## 2018-09-08 MED ORDER — SIMETHICONE 80 MG PO CHEW
80.0000 mg | CHEWABLE_TABLET | Freq: Four times a day (QID) | ORAL | Status: DC | PRN
  Administered 2018-09-08: 80 mg via ORAL
  Filled 2018-09-08 (×2): qty 1

## 2018-09-08 MED ORDER — MENTHOL 3 MG MT LOZG
1.0000 | LOZENGE | OROMUCOSAL | Status: DC | PRN
  Filled 2018-09-08: qty 9

## 2018-09-08 MED ORDER — HYDROMORPHONE HCL 1 MG/ML IJ SOLN
INTRAMUSCULAR | Status: AC
  Filled 2018-09-08: qty 1

## 2018-09-08 MED ORDER — LIDOCAINE HCL (CARDIAC) PF 100 MG/5ML IV SOSY
PREFILLED_SYRINGE | INTRAVENOUS | Status: DC | PRN
  Administered 2018-09-08: 90 mg via INTRAVENOUS

## 2018-09-08 MED ORDER — OXYCODONE HCL 5 MG PO TABS
ORAL_TABLET | ORAL | Status: AC
  Filled 2018-09-08: qty 2

## 2018-09-08 MED ORDER — IBUPROFEN 600 MG PO TABS
600.0000 mg | ORAL_TABLET | Freq: Four times a day (QID) | ORAL | Status: DC | PRN
  Administered 2018-09-08: 600 mg via ORAL
  Filled 2018-09-08: qty 1

## 2018-09-08 MED ORDER — PROMETHAZINE HCL 25 MG/ML IJ SOLN
6.2500 mg | INTRAMUSCULAR | Status: DC | PRN
  Filled 2018-09-08: qty 1

## 2018-09-08 SURGICAL SUPPLY — 42 items
CABLE HIGH FREQUENCY MONO STRZ (ELECTRODE) IMPLANT
COVER BACK TABLE 60X90IN (DRAPES) ×2 IMPLANT
COVER MAYO STAND STRL (DRAPES) ×2 IMPLANT
DERMABOND ADVANCED (GAUZE/BANDAGES/DRESSINGS) ×1
DERMABOND ADVANCED .7 DNX12 (GAUZE/BANDAGES/DRESSINGS) ×1 IMPLANT
DRSG COVADERM PLUS 2X2 (GAUZE/BANDAGES/DRESSINGS) IMPLANT
DRSG OPSITE POSTOP 3X4 (GAUZE/BANDAGES/DRESSINGS) ×2 IMPLANT
DURAPREP 26ML APPLICATOR (WOUND CARE) ×2 IMPLANT
ELECT REM PT RETURN 9FT ADLT (ELECTROSURGICAL)
ELECTRODE REM PT RTRN 9FT ADLT (ELECTROSURGICAL) IMPLANT
FILTER SMOKE EVAC LAPAROSHD (FILTER) IMPLANT
GAUZE 4X4 16PLY RFD (DISPOSABLE) ×2 IMPLANT
GLOVE BIO SURGEON STRL SZ 6.5 (GLOVE) ×2 IMPLANT
GLOVE BIO SURGEON STRL SZ7 (GLOVE) ×4 IMPLANT
GLOVE BIO SURGEON STRL SZ7.5 (GLOVE) ×2 IMPLANT
GLOVE BIOGEL PI IND STRL 6.5 (GLOVE) ×1 IMPLANT
GLOVE BIOGEL PI IND STRL 7.0 (GLOVE) ×1 IMPLANT
GLOVE BIOGEL PI IND STRL 7.5 (GLOVE) ×2 IMPLANT
GLOVE BIOGEL PI INDICATOR 6.5 (GLOVE) ×1
GLOVE BIOGEL PI INDICATOR 7.0 (GLOVE) ×1
GLOVE BIOGEL PI INDICATOR 7.5 (GLOVE) ×2
GLOVE ECLIPSE 6.5 STRL STRAW (GLOVE) ×2 IMPLANT
GLOVE ECLIPSE 7.0 STRL STRAW (GLOVE) ×2 IMPLANT
NEEDLE INSUFFLATION 120MM (ENDOMECHANICALS) ×2 IMPLANT
NS IRRIG 500ML POUR BTL (IV SOLUTION) ×2 IMPLANT
PACK LAVH (CUSTOM PROCEDURE TRAY) ×2 IMPLANT
PACK ROBOTIC GOWN (GOWN DISPOSABLE) ×2 IMPLANT
PACK TRENDGUARD 450 HYBRID PRO (MISCELLANEOUS) IMPLANT
PROTECTOR NERVE ULNAR (MISCELLANEOUS) ×4 IMPLANT
SET IRRIG TUBING LAPAROSCOPIC (IRRIGATION / IRRIGATOR) ×2 IMPLANT
SET IRRIG Y TYPE TUR BLADDER L (SET/KITS/TRAYS/PACK) ×2 IMPLANT
SHEARS HARMONIC ACE PLUS 36CM (ENDOMECHANICALS) ×2 IMPLANT
SUT VIC AB 0 CT1 18XCR BRD8 (SUTURE) ×2 IMPLANT
SUT VIC AB 0 CT1 8-18 (SUTURE) ×2
SUT VIC AB 2-0 CT1 (SUTURE) ×4 IMPLANT
SUT VICRYL 0 TIES 12 18 (SUTURE) IMPLANT
SUT VICRYL 4-0 PS2 18IN ABS (SUTURE) ×2 IMPLANT
TOWEL OR 17X24 6PK STRL BLUE (TOWEL DISPOSABLE) ×4 IMPLANT
TRAY FOLEY W/BAG SLVR 14FR (SET/KITS/TRAYS/PACK) ×2 IMPLANT
TRENDGUARD 450 HYBRID PRO PACK (MISCELLANEOUS)
TROCAR BLADELESS OPT 5 100 (ENDOMECHANICALS) ×6 IMPLANT
WARMER LAPAROSCOPE (MISCELLANEOUS) ×2 IMPLANT

## 2018-09-08 NOTE — Discharge Summary (Signed)
Physician Discharge Summary  Patient ID: Cheryl [name] MRN: [id number] DOB/AGE: 43-21-1977 43 y.o.  Admit date: 09/08/2018 Discharge date: 09/08/2018  Admission Diagnoses: Uterine polyps Breast Cancer x2  Discharge Diagnoses:  Active Problems:   S/P laparoscopic assisted vaginal hysterectomy (LAVH) Bilateral salpingoophorectomies Cystoscopy  Discharged Condition: good  Hospital Course:  Pt admitted for routine post-operative care and by the evening of surgery was ambulating, tolerating regular diet, and voiding without problem.  Consults: None  Significant Diagnostic Studies: labs: CBC, BMP  Treatments: surgery: LAVH BSO  Cystoscopy  Discharge Exam: Blood pressure 131/82, pulse 73, temperature 98.1 F (36.7 C), resp. rate 18, height 5\' 2"  (1.575 m), weight 95.8 kg, last menstrual period 04/02/2015, SpO2 97 %. General appearance: alert and cooperative GI: soft NT Incision/Wound: CDI  Disposition: Discharge disposition: 01-Home or Self Care       Discharge Instructions    Call MD for:  persistant nausea and vomiting   Complete by:  As directed    Call MD for:  redness, tenderness, or signs of infection (pain, swelling, redness, odor or green/yellow discharge around incision site)   Complete by:  As directed    Call MD for:  severe uncontrolled pain   Complete by:  As directed    Call MD for:  temperature >100.4   Complete by:  As directed    Diet - low sodium heart healthy   Complete by:  As directed    Discharge instructions   Complete by:  As directed    Avoid driving for at least 1-2 weeks or until off narcotic pain meds.  No heavy lifting greater than 10 lbs for 6 weeks.  Nothing in vagina for 6 weeks.    Shower over incision and pat dry.     Allergies as of 09/08/2018      Reactions   Adhesive [tape] Other (See Comments)   Band Aids causes Skin blisters.  All other tape is okay to use      Medication List    STOP taking these medications    levofloxacin 500 MG tablet Commonly known as:  LEVAQUIN   sulfamethoxazole-trimethoprim 800-160 MG tablet Commonly known as:  BACTRIM DS,SEPTRA DS     TAKE these medications   anastrozole 1 MG tablet Commonly known as:  ARIMIDEX Take 1 tablet (1 mg total) by mouth daily.   b complex vitamins tablet Take 1 tablet by mouth daily.   cholecalciferol 1000 units tablet Commonly known as:  VITAMIN D Take 1,000 Units by mouth daily.   ibuprofen 600 MG tablet Commonly known as:  ADVIL,MOTRIN Take 1 tablet (600 mg total) by mouth every 6 (six) hours as needed (mild pain). What changed:    medication strength  how much to take  when to take this  reasons to take this   Oxycodone HCl 10 MG Tabs Take 1 tablet (10 mg total) by mouth every 4 (four) hours as needed for moderate pain.   Vitamin D (Ergocalciferol) 50000 units Caps capsule Commonly known as:  DRISDOL Take 1 capsule (50,000 Units total) by mouth every 7 (seven) days.      Follow-up Information    [name], MD. Schedule an appointment as soon as possible for a visit in 2 week(s).   Specialty:  Obstetrics and Gynecology Why:  incision check Contact information: [address] [place] [address] [phone]           Signed: Logan Bores 09/08/2018, 6:25 PM

## 2018-09-08 NOTE — Progress Notes (Signed)
Patient ID: Cheryl [name], female   DOB: 12-Feb-1976, 42 y.o.   MRN: [id number] Per pt no changes in dictated H&P.  Breast cellulitis of a couple weeks ago is resolved.  Reviewed procedure and patient ready to proceed.

## 2018-09-08 NOTE — Transfer of Care (Signed)
Immediate Anesthesia Transfer of Care Note  Patient: Cheryl [name]  Procedure(s) Performed: LAPAROSCOPIC ASSISTED VAGINAL HYSTERECTOMY WITH SALPINGO OOPHORECTOMY (Bilateral Abdomen)  Patient Location: PACU  Anesthesia Type:General  Level of Consciousness: awake, alert  and oriented  Airway & Oxygen Therapy: Patient Spontanous Breathing and Patient connected to face mask oxygen  Post-op Assessment: Report given to RN and Post -op Vital signs reviewed and stable  Post vital signs: Reviewed and stable  Last Vitals:  Vitals Value Taken Time  BP    Temp    Pulse 93 09/08/2018  9:31 AM  Resp 18 09/08/2018  9:31 AM  SpO2 98 % 09/08/2018  9:31 AM  Vitals shown include unvalidated device data.  Last Pain:  Vitals:   09/08/18 0622  TempSrc:   PainSc: 0-No pain      Patients Stated Pain Goal: 5 (28/76/81 1572)  Complications: No apparent anesthesia complications

## 2018-09-08 NOTE — Anesthesia Postprocedure Evaluation (Signed)
Anesthesia Post Note  Patient: Cheryl [name]  Procedure(s) Performed: LAPAROSCOPIC ASSISTED VAGINAL HYSTERECTOMY WITH SALPINGO OOPHORECTOMY (Bilateral Abdomen)     Patient location during evaluation: PACU Anesthesia Type: General Level of consciousness: sedated Pain management: pain level controlled Vital Signs Assessment: post-procedure vital signs reviewed and stable Respiratory status: spontaneous breathing and respiratory function stable Cardiovascular status: stable Postop Assessment: no apparent nausea or vomiting Anesthetic complications: no    Last Vitals:  Vitals:   09/08/18 1030 09/08/18 1041  BP: 140/85 (!) 150/89  Pulse: 70 72  Resp: 12 14  Temp: 36.9 C 36.8 C  SpO2: 96% 100%    Last Pain:  Vitals:   09/08/18 1030  TempSrc:   PainSc: 4                  Kohan Azizi DANIEL

## 2018-09-08 NOTE — Op Note (Signed)
Operative Note    Preoperative Diagnosis Uterine polyps Pelvic Pain Breast Cancer x 2   Postoperative Diagnosis same  Procedure Laparoscopic assisted vaginal hysterectomy and bilateral salpingoophorectomy Cystoscopy  Surgeon [name], MD Cheri Fowler, MD  Anesthesia GETA  Fluids: EBL 154ml UOP 558mL clear IVF 1071mL LR  Findings Uterus tubes and ovaries appeared normal as did the remainder of abdomen and pelvis.  Bladder intact with no sutures noted and normal ureteral jets bilaterally  Specimen Uterus, ovaries and tubes  Procedure Note Patient was taken to the operating room where general anesthesia was obtained without difficulty. She was then prepped and draped in the normal sterile fashion in the dorsal lithotomy position. An appropriate timeout was performed. A speculum was then placed within the vagina and a Hulka tenaculum placed within the cervix for uterine manipulation. A foley catheter was placed in the bladder.  Attention was then turned to the patient's abdomen after draping where the infraumbilical area was injected with approximately 10 cc of quarter percent Marcaine. A 1 cm incision was then made within the umbilicus and the varies needle easily introduced into the peritoneal cavity. Intraperitoneal placement was confirmed by aspiration and injection with normal saline. Gas flow was then applied and a pneumoperitoneum obtained with approximate 3 L of CO2 gas. The varies needle was then removed and a 5 mm optiview trocar was easily introduced into the abdomen under direct visualization. . With patient in Trendelenburg the uterus and tubes and ovaries were inspected with findings as previously stated. Two additional trocars were placed in the upper lateral quadrants under direct visualization after injection with quarter percent marcaine.  The Harmonic scalpel was then utilized to take down the infundibulopelvic ligament  Bilaterally and free the ovaries an  tubes from the side walls.  The remainder of the uteroovarian ligament and the round ligament were then also taken down with the Harmonic to the level of the bladder flap.  The bladder flap was taken down from the lower uterine segment and pushed away to expose the cervix. The uterine arteries were taken down bilaterally with Harmonic scalpel and rendered hemostatic.  Attention was then turned to the vagina after all instruments were removed and the trocars covered with a sterile drape. The cervix was grasped with Yates Decamp tenaculums x 2 and injected with a dilute solution of Pitressin circumferentially.  The bovie was then used to make a circumferential incision.  The mayo scissors then further dissected the vaginal mucosa from the underlying cervix and the anterior and posterior cul de sac entered sharply.  With a banana speculum and deaver retractor isolating the uterus from the bladder and rectum.  The uterosacral ligaments and paracervical tissue was taken down sequentially with parametrial clamps and suture ligated with zero vicryl at each step.  When  the was uterus freed on the patient's left, it was then delivered and the remaining tissue on the right clamped and transected completely freeing the uterus and tubes.  It was handed off to pathology.  The posterior cuff was run in a locking suture for hemostasis.  The uterosacral ligaments were approximated with zero vicryl.  The short weighted speculum was placed and the cuff run with a running locked 2-0 vicryl for hemostasis. All instruments were then removed from the vagina. A cystoscopy was performed and the bladder noted to be normal with no sutures and the ureters with normal jets bilaterally at the orifices.   Gowns and gloves were changed and attention was returned to the  abdomen, where pneumoperitoneum was again obtained and all inspected.  The cuff and pedicles were hemostatic and the ureters visualized and normal in appearance. A four quadrant  view of the pelvis and abdomen was performed and found to be normal with no bleeding or injuries noted.  The instruments were removed from the abdomen.  The pneumoperitoneum was reduced through the trocar. The trocar was finally removed and the infraumbilical incision and lateral incisions were closed with a subcuticular stitch of 3-0 Vicryl. Dermabond and a bandage were placed. Patient was then awakened and taken to the recovery room in good condition.

## 2018-09-08 NOTE — Anesthesia Procedure Notes (Signed)
Procedure Name: Intubation Date/Time: 09/08/2018 7:27 AM Performed by: Jonna Munro, CRNA Pre-anesthesia Checklist: Patient identified, Emergency Drugs available, Suction available, Patient being monitored and Timeout performed Patient Re-evaluated:Patient Re-evaluated prior to induction Oxygen Delivery Method: Circle system utilized Preoxygenation: Pre-oxygenation with 100% oxygen Induction Type: IV induction Ventilation: Mask ventilation without difficulty Laryngoscope Size: Mac and 3 Grade View: Grade I Tube type: Oral Tube size: 7.0 mm Number of attempts: 1 Airway Equipment and Method: Stylet Placement Confirmation: ETT inserted through vocal cords under direct vision,  positive ETCO2 and breath sounds checked- equal and bilateral Secured at: 22 cm Tube secured with: Tape Dental Injury: Teeth and Oropharynx as per pre-operative assessment

## 2018-09-08 NOTE — Discharge Instructions (Signed)

## 2018-09-08 NOTE — Progress Notes (Signed)
Day of Surgery Procedure(s) (LRB): LAPAROSCOPIC ASSISTED VAGINAL HYSTERECTOMY WITH SALPINGO OOPHORECTOMY (Bilateral)  Subjective: Patient reports tolerating PO and no problems voiding.  She states her pain is much better over the last several hours and she would like d/c home this PM.  Ambulating fine.    Objective: I have reviewed patient's vital signs and intake and output.  General: alert and cooperative GI: incision: clean, dry and intact Vaginal Bleeding: minimal  Assessment: s/p Procedure(s): LAPAROSCOPIC ASSISTED VAGINAL HYSTERECTOMY WITH SALPINGO OOPHORECTOMY (Bilateral): progressing well  Plan: Discharge home  D/c instructions reviewed with pt in detail  LOS: 0 days    Logan Bores 09/08/2018, 6:19 PM

## 2018-09-09 ENCOUNTER — Encounter (HOSPITAL_BASED_OUTPATIENT_CLINIC_OR_DEPARTMENT_OTHER): Payer: Self-pay | Admitting: Obstetrics and Gynecology

## 2018-09-13 ENCOUNTER — Encounter: Payer: Self-pay | Admitting: Plastic Surgery

## 2018-09-13 ENCOUNTER — Other Ambulatory Visit: Payer: Self-pay

## 2018-09-13 ENCOUNTER — Ambulatory Visit: Payer: 59 | Admitting: Plastic Surgery

## 2018-09-13 VITALS — BP 138/88 | HR 100 | Ht 62.0 in | Wt 214.0 lb

## 2018-09-13 DIAGNOSIS — C50912 Malignant neoplasm of unspecified site of left female breast: Secondary | ICD-10-CM

## 2018-09-13 DIAGNOSIS — Z9882 Breast implant status: Secondary | ICD-10-CM | POA: Diagnosis not present

## 2018-09-13 DIAGNOSIS — Z9889 Other specified postprocedural states: Secondary | ICD-10-CM | POA: Insufficient documentation

## 2018-09-13 DIAGNOSIS — Z171 Estrogen receptor negative status [ER-]: Secondary | ICD-10-CM

## 2018-09-13 DIAGNOSIS — Z9013 Acquired absence of bilateral breasts and nipples: Secondary | ICD-10-CM

## 2018-09-13 DIAGNOSIS — C50412 Malignant neoplasm of upper-outer quadrant of left female breast: Secondary | ICD-10-CM

## 2018-09-13 HISTORY — DX: Other specified postprocedural states: Z98.890

## 2018-09-13 NOTE — H&P (View-Only) (Signed)
Patient ID: Cheryl [name], female    DOB: [birth year], 42 y.o.   MRN: [id number]   Chief Complaint  Patient presents with  . Breast Problem    follow up    The patient is a 42 year old white female here for evaluation of her left breast.  [name] concerned about some swelling, discoloration, and tenderness on the lateral aspect of the left breast.  She currently has a mentor smooth round ultra high profile gel implants 590 cc.  Her history is the following: In 2016 she had 2 lumpectomies of the last left breast followed by radiation.  She then had a new triple negative grade 2 invasive ductal carcinoma of the lower inner left breast.  It was ER / PR positive.  The right breast had a lesion that was a fibroadenoma.   She underwent the mastectomies in 03/2017 then exchange 12/18.  In June 2019 she had fat grafting for improved contour.  She was doing well until one month ago when she had severe swelling and redness of the left breast.  She was seen at another office and given antibiotics.  The redness improved but the swelling remained and now has a 3 x 4 cm area of extremely thin skin which is very concerning for rupture laterally.  [name] 5 feet 2 inches tall her preop bra equals 3 8 DD   Review of Systems  Constitutional: Positive for activity change. Negative for appetite change.  HENT: Negative.   Eyes: Negative.   Respiratory: Negative.   Cardiovascular: Negative.   Gastrointestinal: Negative.   Genitourinary: Negative.   Musculoskeletal: Negative.   Skin: Positive for color change. Negative for wound.  Neurological: Negative.   Hematological: Negative.   Psychiatric/Behavioral: Negative.     Past Medical History:  Diagnosis Date  . Breast cancer ([place]) 2016 and 2018   left breast, surgery and chemo done 2018  . Cellulitis 07/25/2018   left breast  . Pre-diabetes   . SVD (spontaneous vaginal delivery)    x 3  . Wears contact lenses     Past Surgical History:    Procedure Laterality Date  . BILATERAL TOTAL MASTECTOMY WITH AXILLARY LYMPH NODE DISSECTION  04/20/2017  . BREAST BIOPSY Left 02/18/2015  . BREAST BIOPSY Right 02/18/2015  . BREAST EXCISIONAL BIOPSY Left 04/05/2015  . BREAST LUMPECTOMY Left 03/27/2015  . BREAST LUMPECTOMY WITH NEEDLE LOCALIZATION AND AXILLARY SENTINEL LYMPH NODE BX Left 03/27/2015   Procedure: LEFT BREAST LUMPECTOMY WITH BRACKETED NEEDLE LOCALIZATION AND LEFT  AXILLARY SENTINEL LYMPH NODE BX;  Surgeon: [name], MD;  Location: [facility];  Service: General;  Laterality: Left;  . BREAST RECONSTRUCTION WITH PLACEMENT OF TISSUE EXPANDER AND FLEX HD (ACELLULAR HYDRATED DERMIS) Bilateral 04/20/2017   Procedure: BILATARAL BREAST RECONSTRUCTION WITH PLACEMENT OF TISSUE EXPANDER AND FLEX HD (ACELLULAR HYDRATED DERMIS);  Surgeon: Wallace Going, DO;  Location: [facility];  Service: Plastics;  Laterality: Bilateral;  . BREAST SURGERY Bilateral    masectomy  . CHOLECYSTECTOMY N/A 07/07/2017   Procedure: LAPAROSCOPIC CHOLECYSTECTOMY WITH INTRAOPERATIVE CHOLANGIOGRAM;  Surgeon: [name], MD;  Location: [facility];  Service: General;  Laterality: N/A;  . Insertion of Essure  yrs ago   For Birth control  . LAPAROSCOPIC VAGINAL HYSTERECTOMY WITH SALPINGO OOPHORECTOMY Bilateral 09/08/2018   Procedure: LAPAROSCOPIC ASSISTED VAGINAL HYSTERECTOMY WITH SALPINGO OOPHORECTOMY;  Surgeon: [name], MD;  Location: [facility];  Service: Gynecology;  Laterality: Bilateral;  . LIPOSUCTION WITH LIPOFILLING Bilateral  05/20/2018   Procedure: Bilateral breast fat grafting from abdomen to improve symmetry following breast reconstruction;  Surgeon: Wallace Going, DO;  Location: [facility];  Service: Plastics;  Laterality: Bilateral;  . MASTECTOMY W/ SENTINEL NODE BIOPSY Bilateral 04/20/2017   Procedure: BILATERAL TOTAL MASTECTOMY WITH LEFT AXILLARY SENTINEL LYMPH NODE BIOPSY;  Surgeon:  [name], MD;  Location: [place];  Service: General;  Laterality: Bilateral;  . PORTA CATH REMOVAL Right 11/04/2017   Procedure: PORTA CATH REMOVAL;  Surgeon: Wallace Going, DO;  Location: [facility];  Service: Plastics;  Laterality: Right;  . PORTACATH PLACEMENT Right 04/25/2015   Procedure: INSERTION PORT-A-CATH;  Surgeon: [name], MD;  Location: [facility];  Service: General;  Laterality: Right;, renoved in 2016  . PORTACATH PLACEMENT Right 04/20/2017   Procedure: INSERTION PORT-A-CATH;  Surgeon: [name], MD;  Location: [facility];  Service: General;  Laterality: Right;  . RE-EXCISION OF BREAST LUMPECTOMY Left 04/05/2015   Procedure: RE-EXCISION LEFT  BREAST LUMPECTOMY;  Surgeon: [name], MD;  Location: [place] [facility];  Service: General;  Laterality: Left;  . REMOVAL OF TISSUE EXPANDER AND PLACEMENT OF IMPLANT Bilateral 11/04/2017   Procedure: BILATERAL REMOVAL OF TISSUE EXPANDER AND PLACEMENT OF  BILATERAL BREAST IMPLANT;  Surgeon: Wallace Going, DO;  Location: [facility];  Service: Plastics;  Laterality: Bilateral;      Current Outpatient Medications:  .  anastrozole (ARIMIDEX) 1 MG tablet, Take 1 tablet (1 mg total) by mouth daily., Disp: 90 tablet, Rfl: 4 .  b complex vitamins tablet, Take 1 tablet by mouth daily., Disp: , Rfl:  .  cholecalciferol (VITAMIN D) 1000 units tablet, Take 1,000 Units by mouth daily., Disp: , Rfl:  .  ibuprofen (ADVIL,MOTRIN) 600 MG tablet, Take 1 tablet (600 mg total) by mouth every 6 (six) hours as needed (mild pain)., Disp: 30 tablet, Rfl: 0 .  oxyCODONE 10 MG TABS, Take 1 tablet (10 mg total) by mouth every 4 (four) hours as needed for moderate pain., Disp: 30 tablet, Rfl: 0 .  Vitamin D, Ergocalciferol, (DRISDOL) 50000 units CAPS capsule, Take 1 capsule (50,000 Units total) by mouth every 7 (seven) days. (Patient taking differently: Take 50,000 Units by mouth  every 7 (seven) days. ), Disp: 12 capsule, Rfl: 10   Objective:   Vitals:   09/13/18 1118  BP: 138/88  Pulse: 100  SpO2: 99%    Physical Exam  Constitutional: [name] oriented to person, place, and time. She appears well-developed and well-nourished.  HENT:  Head: Normocephalic and atraumatic.  Eyes: Pupils are equal, round, and reactive to light. EOM are normal.  Cardiovascular: Normal rate.  Pulmonary/Chest: Effort normal.  Neurological: [name] alert and oriented to person, place, and time.  Skin: Skin is warm.  Psychiatric: She has a normal mood and affect. Her behavior is normal. Judgment and thought content normal.    Assessment & Plan:  Malignant neoplasm of upper-outer quadrant of left breast in female, estrogen receptor negative ([place])  Acquired absence of bilateral breasts and nipples  Recurrent breast cancer, left ([facility])  S/P breast reconstruction  Status post left breast implant  We discussed the options for salvaging the reconstruction.  The skin is not broken however it is extremely thin and very concerning for extrusion.  This would increase her risk of infection.  We discussed going with a smaller implant.  This may or may not help.  This would not be my  first choice.  We also discussed a latissimus myocutaneous flap which would be the best bet for salvage and longevity.  The patient is in agreement and said she thought this would most likely be the best option as well.  Therefore we will plan on a left latissimus myocutaneous flap with expander placement.  This will be a two-stage surgery  [place], DO

## 2018-09-13 NOTE — Progress Notes (Signed)
Patient ID: Cheryl [name], female    DOB: Nov 03, 1976, 42 y.o.   MRN: [id number]   Chief Complaint  Patient presents with  . Breast Problem    follow up    The patient is a 42 year old white female here for evaluation of her left breast.  [name] concerned about some swelling, discoloration, and tenderness on the lateral aspect of the left breast.  She currently has a mentor smooth round ultra high profile gel implants 590 cc.  Her history is the following: In 2016 she had 2 lumpectomies of the last left breast followed by radiation.  She then had a new triple negative grade 2 invasive ductal carcinoma of the lower inner left breast.  It was ER / PR positive.  The right breast had a lesion that was a fibroadenoma.   She underwent the mastectomies in 03/2017 then exchange 12/18.  In June 2019 she had fat grafting for improved contour.  She was doing well until one month ago when she had severe swelling and redness of the left breast.  She was seen at another office and given antibiotics.  The redness improved but the swelling remained and now has a 3 x 4 cm area of extremely thin skin which is very concerning for rupture laterally.  [name] 5 feet 2 inches tall her preop bra equals 3 8 DD   Review of Systems  Constitutional: Positive for activity change. Negative for appetite change.  HENT: Negative.   Eyes: Negative.   Respiratory: Negative.   Cardiovascular: Negative.   Gastrointestinal: Negative.   Genitourinary: Negative.   Musculoskeletal: Negative.   Skin: Positive for color change. Negative for wound.  Neurological: Negative.   Hematological: Negative.   Psychiatric/Behavioral: Negative.     Past Medical History:  Diagnosis Date  . Breast cancer ([place]) 2016 and 2018   left breast, surgery and chemo done 2018  . Cellulitis 07/25/2018   left breast  . Pre-diabetes   . SVD (spontaneous vaginal delivery)    x 3  . Wears contact lenses     Past Surgical History:    Procedure Laterality Date  . BILATERAL TOTAL MASTECTOMY WITH AXILLARY LYMPH NODE DISSECTION  04/20/2017  . BREAST BIOPSY Left 02/18/2015  . BREAST BIOPSY Right 02/18/2015  . BREAST EXCISIONAL BIOPSY Left 04/05/2015  . BREAST LUMPECTOMY Left 03/27/2015  . BREAST LUMPECTOMY WITH NEEDLE LOCALIZATION AND AXILLARY SENTINEL LYMPH NODE BX Left 03/27/2015   Procedure: LEFT BREAST LUMPECTOMY WITH BRACKETED NEEDLE LOCALIZATION AND LEFT  AXILLARY SENTINEL LYMPH NODE BX;  Surgeon: [name], MD;  Location: [facility];  Service: General;  Laterality: Left;  . BREAST RECONSTRUCTION WITH PLACEMENT OF TISSUE EXPANDER AND FLEX HD (ACELLULAR HYDRATED DERMIS) Bilateral 04/20/2017   Procedure: BILATARAL BREAST RECONSTRUCTION WITH PLACEMENT OF TISSUE EXPANDER AND FLEX HD (ACELLULAR HYDRATED DERMIS);  Surgeon: Wallace Going, DO;  Location: [facility];  Service: Plastics;  Laterality: Bilateral;  . BREAST SURGERY Bilateral    masectomy  . CHOLECYSTECTOMY N/A 07/07/2017   Procedure: LAPAROSCOPIC CHOLECYSTECTOMY WITH INTRAOPERATIVE CHOLANGIOGRAM;  Surgeon: [name], MD;  [facility];  Service: General;  Laterality: N/A;  . Insertion of Essure  yrs ago   For Birth control  . LAPAROSCOPIC VAGINAL HYSTERECTOMY WITH SALPINGO OOPHORECTOMY Bilateral 09/08/2018   Procedure: LAPAROSCOPIC ASSISTED VAGINAL HYSTERECTOMY WITH SALPINGO OOPHORECTOMY;  Surgeon: [name], MD;  Location: [facility];  Service: Gynecology;  Laterality: Bilateral;  . LIPOSUCTION WITH LIPOFILLING Bilateral  05/20/2018   Procedure: Bilateral breast fat grafting from abdomen to improve symmetry following breast reconstruction;  Surgeon: Wallace Going, DO;  Location: [facility];  Service: Plastics;  Laterality: Bilateral;  . MASTECTOMY W/ SENTINEL NODE BIOPSY Bilateral 04/20/2017   Procedure: BILATERAL TOTAL MASTECTOMY WITH LEFT AXILLARY SENTINEL LYMPH NODE BIOPSY;  Surgeon:  [name], MD;  Location: [place] [facility] [place];  Service: General;  Laterality: Bilateral;  . PORTA CATH REMOVAL Right 11/04/2017   Procedure: PORTA CATH REMOVAL;  Surgeon: Wallace Going, DO;  Location: [facility];  Service: Plastics;  Laterality: Right;  . PORTACATH PLACEMENT Right 04/25/2015   Procedure: INSERTION PORT-A-CATH;  Surgeon: [name], MD;  Location: [facility];  Service: General;  Laterality: Right;, renoved in 2016  . PORTACATH PLACEMENT Right 04/20/2017   Procedure: INSERTION PORT-A-CATH;  Surgeon: [name], MD;  Location: [facility];  Service: General;  Laterality: Right;  . RE-EXCISION OF BREAST LUMPECTOMY Left 04/05/2015   Procedure: RE-EXCISION LEFT  BREAST LUMPECTOMY;  Surgeon: [name], MD;  Location: [facility];  Service: General;  Laterality: Left;  . REMOVAL OF TISSUE EXPANDER AND PLACEMENT OF IMPLANT Bilateral 11/04/2017   Procedure: BILATERAL REMOVAL OF TISSUE EXPANDER AND PLACEMENT OF  BILATERAL BREAST IMPLANT;  Surgeon: Wallace Going, DO;  Location: [facility];  Service: Plastics;  Laterality: Bilateral;      Current Outpatient Medications:  .  anastrozole (ARIMIDEX) 1 MG tablet, Take 1 tablet (1 mg total) by mouth daily., Disp: 90 tablet, Rfl: 4 .  b complex vitamins tablet, Take 1 tablet by mouth daily., Disp: , Rfl:  .  cholecalciferol (VITAMIN D) 1000 units tablet, Take 1,000 Units by mouth daily., Disp: , Rfl:  .  ibuprofen (ADVIL,MOTRIN) 600 MG tablet, Take 1 tablet (600 mg total) by mouth every 6 (six) hours as needed (mild pain)., Disp: 30 tablet, Rfl: 0 .  oxyCODONE 10 MG TABS, Take 1 tablet (10 mg total) by mouth every 4 (four) hours as needed for moderate pain., Disp: 30 tablet, Rfl: 0 .  Vitamin D, Ergocalciferol, (DRISDOL) 50000 units CAPS capsule, Take 1 capsule (50,000 Units total) by mouth every 7 (seven) days. (Patient taking differently: Take 50,000 Units by mouth  every 7 (seven) days. ), Disp: 12 capsule, Rfl: 10   Objective:   Vitals:   09/13/18 1118  BP: 138/88  Pulse: 100  SpO2: 99%    Physical Exam  Constitutional: [name] oriented to person, place, and time. She appears well-developed and well-nourished.  HENT:  Head: Normocephalic and atraumatic.  Eyes: Pupils are equal, round, and reactive to light. EOM are normal.  Cardiovascular: Normal rate.  Pulmonary/Chest: Effort normal.  Neurological: [name] alert and oriented to person, place, and time.  Skin: Skin is warm.  Psychiatric: She has a normal mood and affect. Her behavior is normal. Judgment and thought content normal.    Assessment & Plan:  Malignant neoplasm of upper-outer quadrant of left breast in female, estrogen receptor negative ([place])  Acquired absence of bilateral breasts and nipples  Recurrent breast cancer, left ([facility])  S/P breast reconstruction  Status post left breast implant  We discussed the options for salvaging the reconstruction.  The skin is not broken however it is extremely thin and very concerning for extrusion.  This would increase her risk of infection.  We discussed going with a smaller implant.  This may or may not help.  This would not be my  first choice.  We also discussed a latissimus myocutaneous flap which would be the best bet for salvage and longevity.  The patient is in agreement and said she thought this would most likely be the best option as well.  Therefore we will plan on a left latissimus myocutaneous flap with expander placement.  This will be a two-stage surgery  [place], DO

## 2018-09-14 ENCOUNTER — Other Ambulatory Visit: Payer: Self-pay

## 2018-09-14 ENCOUNTER — Encounter (HOSPITAL_COMMUNITY): Payer: Self-pay | Admitting: *Deleted

## 2018-09-14 NOTE — Progress Notes (Signed)
Pt denies SOB, chest pain, and being under the care of a cardiologist. Pt denies having a stress test and cardiac cath. Pt denies having a chest x ray and EKG within the last year. Pt made aware to stop taking  Aspirin, vitamins, fish oil and herbal medications. Do not take any NSAIDs ie: Ibuprofen, Advil, Naproxen (Aleve), Motrin, BC and Goody Powder. Pt verbalized understanding of all pre-op instructions.

## 2018-09-15 ENCOUNTER — Inpatient Hospital Stay (HOSPITAL_COMMUNITY)
Admission: RE | Admit: 2018-09-15 | Discharge: 2018-09-16 | DRG: 585 | Disposition: A | Discharge status: Home / Self Care | Payer: 59 | Attending: Plastic Surgery | Admitting: Plastic Surgery

## 2018-09-15 ENCOUNTER — Inpatient Hospital Stay (HOSPITAL_COMMUNITY): Payer: 59 | Admitting: Anesthesiology

## 2018-09-15 ENCOUNTER — Other Ambulatory Visit: Payer: Self-pay

## 2018-09-15 ENCOUNTER — Encounter (HOSPITAL_COMMUNITY): Payer: Self-pay

## 2018-09-15 ENCOUNTER — Encounter (HOSPITAL_COMMUNITY)
Admission: RE | Disposition: A | Discharge status: Home / Self Care | Payer: Self-pay | Source: Home / Self Care | Attending: Plastic Surgery

## 2018-09-15 DIAGNOSIS — Z9012 Acquired absence of left breast and nipple: Secondary | ICD-10-CM | POA: Diagnosis not present

## 2018-09-15 DIAGNOSIS — Z79811 Long term (current) use of aromatase inhibitors: Secondary | ICD-10-CM

## 2018-09-15 DIAGNOSIS — Z9013 Acquired absence of bilateral breasts and nipples: Secondary | ICD-10-CM | POA: Diagnosis not present

## 2018-09-15 DIAGNOSIS — Y838 Other surgical procedures as the cause of abnormal reaction of the patient, or of later complication, without mention of misadventure at the time of the procedure: Secondary | ICD-10-CM | POA: Diagnosis present

## 2018-09-15 DIAGNOSIS — T8549XA Other mechanical complication of breast prosthesis and implant, initial encounter: Secondary | ICD-10-CM | POA: Diagnosis not present

## 2018-09-15 DIAGNOSIS — Z90710 Acquired absence of both cervix and uterus: Secondary | ICD-10-CM

## 2018-09-15 DIAGNOSIS — Z171 Estrogen receptor negative status [ER-]: Secondary | ICD-10-CM | POA: Diagnosis not present

## 2018-09-15 DIAGNOSIS — Z853 Personal history of malignant neoplasm of breast: Secondary | ICD-10-CM | POA: Diagnosis not present

## 2018-09-15 DIAGNOSIS — C50412 Malignant neoplasm of upper-outer quadrant of left female breast: Secondary | ICD-10-CM | POA: Diagnosis not present

## 2018-09-15 HISTORY — PX: BREAST IMPLANT REMOVAL: SHX5361

## 2018-09-15 HISTORY — PX: TISSUE EXPANDER PLACEMENT: SHX2530

## 2018-09-15 HISTORY — PX: LATISSIMUS FLAP TO BREAST: SHX5357

## 2018-09-15 LAB — CBC
HEMATOCRIT: 38 % (ref 36.0–46.0)
Hemoglobin: 12 g/dL (ref 12.0–15.0)
MCH: 28.2 pg (ref 26.0–34.0)
MCHC: 31.6 g/dL (ref 30.0–36.0)
MCV: 89.2 fL (ref 80.0–100.0)
NRBC: 0 % (ref 0.0–0.2)
PLATELETS: 355 10*3/uL (ref 150–400)
RBC: 4.26 MIL/uL (ref 3.87–5.11)
RDW: 13.3 % (ref 11.5–15.5)
WBC: 13.8 10*3/uL — AB (ref 4.0–10.5)

## 2018-09-15 LAB — CREATININE, SERUM: CREATININE: 0.89 mg/dL (ref 0.44–1.00)

## 2018-09-15 LAB — GLUCOSE, CAPILLARY: Glucose-Capillary: 109 mg/dL — ABNORMAL HIGH (ref 70–99)

## 2018-09-15 SURGERY — RECONSTRUCTION, BREAST, USING LATISSIMUS DORSI MYOCUTANEOUS FLAP
Anesthesia: General | Site: Breast | Laterality: Left

## 2018-09-15 MED ORDER — POLYETHYLENE GLYCOL 3350 17 G PO PACK: 17.0000 g | PACK | Freq: Every day | ORAL | Status: DC | PRN

## 2018-09-15 MED ORDER — KETAMINE HCL 50 MG/5ML IJ SOSY
PREFILLED_SYRINGE | INTRAMUSCULAR | Status: AC
  Filled 2018-09-15: qty 5

## 2018-09-15 MED ORDER — LIDOCAINE 2% (20 MG/ML) 5 ML SYRINGE
INTRAMUSCULAR | Status: DC | PRN
  Administered 2018-09-15: 80 mg via INTRAVENOUS

## 2018-09-15 MED ORDER — CEFAZOLIN SODIUM-DEXTROSE 2-4 GM/100ML-% IV SOLN
2.0000 g | Freq: Three times a day (TID) | INTRAVENOUS | Status: DC
  Administered 2018-09-15 – 2018-09-16 (×2): 2 g via INTRAVENOUS
  Filled 2018-09-15 (×3): qty 100

## 2018-09-15 MED ORDER — ONDANSETRON HCL 4 MG/2ML IJ SOLN: 4.0000 mg | Freq: Four times a day (QID) | INTRAMUSCULAR | Status: DC | PRN

## 2018-09-15 MED ORDER — ENOXAPARIN SODIUM 40 MG/0.4ML ~~LOC~~ SOLN: 40.0000 mg | SUBCUTANEOUS | 0 refills | Status: DC

## 2018-09-15 MED ORDER — DEXMEDETOMIDINE HCL IN NACL 200 MCG/50ML IV SOLN
INTRAVENOUS | Status: DC | PRN
  Administered 2018-09-15: 4 ug via INTRAVENOUS
  Administered 2018-09-15 (×2): 8 ug via INTRAVENOUS

## 2018-09-15 MED ORDER — PHENYLEPHRINE 40 MCG/ML (10ML) SYRINGE FOR IV PUSH (FOR BLOOD PRESSURE SUPPORT)
PREFILLED_SYRINGE | INTRAVENOUS | Status: DC | PRN
  Administered 2018-09-15 (×5): 40 ug via INTRAVENOUS
  Administered 2018-09-15: 80 ug via INTRAVENOUS

## 2018-09-15 MED ORDER — MEPERIDINE HCL 50 MG/ML IJ SOLN: 6.2500 mg | INTRAMUSCULAR | Status: DC | PRN

## 2018-09-15 MED ORDER — NAPROXEN 250 MG PO TABS: 500.0000 mg | ORAL_TABLET | Freq: Two times a day (BID) | ORAL | Status: DC | PRN

## 2018-09-15 MED ORDER — PROPOFOL 10 MG/ML IV BOLUS
INTRAVENOUS | Status: DC | PRN
  Administered 2018-09-15: 150 mg via INTRAVENOUS
  Administered 2018-09-15: 50 mg via INTRAVENOUS

## 2018-09-15 MED ORDER — PROMETHAZINE HCL 25 MG/ML IJ SOLN: 6.2500 mg | INTRAMUSCULAR | Status: DC | PRN

## 2018-09-15 MED ORDER — SODIUM CHLORIDE 0.9 % IV SOLN
INTRAVENOUS | Status: DC | PRN
  Administered 2018-09-15: 500 mL

## 2018-09-15 MED ORDER — DEXAMETHASONE SODIUM PHOSPHATE 10 MG/ML IJ SOLN
INTRAMUSCULAR | Status: DC | PRN
  Administered 2018-09-15: 10 mg via INTRAVENOUS

## 2018-09-15 MED ORDER — SODIUM CHLORIDE 0.9 % IR SOLN
Status: DC | PRN
  Administered 2018-09-15: 1000 mL

## 2018-09-15 MED ORDER — SUGAMMADEX SODIUM 200 MG/2ML IV SOLN
INTRAVENOUS | Status: DC | PRN
  Administered 2018-09-15: 200 mg via INTRAVENOUS

## 2018-09-15 MED ORDER — MORPHINE SULFATE (PF) 2 MG/ML IV SOLN
2.0000 mg | INTRAVENOUS | Status: DC | PRN
  Administered 2018-09-15 – 2018-09-16 (×2): 2 mg via INTRAVENOUS
  Filled 2018-09-15 (×2): qty 1

## 2018-09-15 MED ORDER — 0.9 % SODIUM CHLORIDE (POUR BTL) OPTIME
TOPICAL | Status: DC | PRN
  Administered 2018-09-15 (×2): 1000 mL

## 2018-09-15 MED ORDER — HYDROMORPHONE HCL 1 MG/ML IJ SOLN
0.2500 mg | INTRAMUSCULAR | Status: DC | PRN
  Administered 2018-09-15 (×2): 0.5 mg via INTRAVENOUS

## 2018-09-15 MED ORDER — ROCURONIUM BROMIDE 50 MG/5ML IV SOSY
PREFILLED_SYRINGE | INTRAVENOUS | Status: AC
  Filled 2018-09-15: qty 5

## 2018-09-15 MED ORDER — HYDROCODONE-ACETAMINOPHEN 5-325 MG PO TABS: 1.0000 | ORAL_TABLET | ORAL | 0 refills | Status: DC | PRN

## 2018-09-15 MED ORDER — OXYCODONE HCL 5 MG PO TABS: 5.0000 mg | ORAL_TABLET | Freq: Once | ORAL | Status: DC | PRN

## 2018-09-15 MED ORDER — SUCCINYLCHOLINE CHLORIDE 200 MG/10ML IV SOSY
PREFILLED_SYRINGE | INTRAVENOUS | Status: AC
  Filled 2018-09-15: qty 10

## 2018-09-15 MED ORDER — SODIUM CHLORIDE 0.9 % IV SOLN
INTRAVENOUS | Status: DC | PRN
  Administered 2018-09-15: 10 ug/min via INTRAVENOUS

## 2018-09-15 MED ORDER — ENOXAPARIN SODIUM 40 MG/0.4ML ~~LOC~~ SOLN
40.0000 mg | SUBCUTANEOUS | Status: DC
  Filled 2018-09-15: qty 0.4

## 2018-09-15 MED ORDER — MIDAZOLAM HCL 5 MG/5ML IJ SOLN
INTRAMUSCULAR | Status: DC | PRN
  Administered 2018-09-15: 2 mg via INTRAVENOUS

## 2018-09-15 MED ORDER — OXYCODONE HCL 5 MG/5ML PO SOLN: 5.0000 mg | Freq: Once | ORAL | Status: DC | PRN

## 2018-09-15 MED ORDER — CEPHALEXIN 500 MG PO CAPS: 500.0000 mg | ORAL_CAPSULE | Freq: Four times a day (QID) | ORAL | 0 refills | Status: AC

## 2018-09-15 MED ORDER — MORPHINE SULFATE (PF) 2 MG/ML IV SOLN
INTRAVENOUS | Status: AC
  Filled 2018-09-15: qty 1

## 2018-09-15 MED ORDER — ONDANSETRON HCL 4 MG/2ML IJ SOLN
INTRAMUSCULAR | Status: AC
  Filled 2018-09-15: qty 2

## 2018-09-15 MED ORDER — FENTANYL CITRATE (PF) 250 MCG/5ML IJ SOLN
INTRAMUSCULAR | Status: AC
  Filled 2018-09-15: qty 5

## 2018-09-15 MED ORDER — ONDANSETRON 4 MG PO TBDP: 4.0000 mg | ORAL_TABLET | Freq: Four times a day (QID) | ORAL | Status: DC | PRN

## 2018-09-15 MED ORDER — SODIUM CHLORIDE 0.9 % IV SOLN
INTRAVENOUS | Status: AC
  Filled 2018-09-15: qty 500000

## 2018-09-15 MED ORDER — OXYCODONE HCL 5 MG PO TABS
5.0000 mg | ORAL_TABLET | ORAL | Status: DC | PRN
  Administered 2018-09-15 – 2018-09-16 (×3): 10 mg via ORAL
  Filled 2018-09-15 (×3): qty 2

## 2018-09-15 MED ORDER — ENOXAPARIN SODIUM 40 MG/0.4ML ~~LOC~~ SOLN
SUBCUTANEOUS | Status: DC | PRN
  Administered 2018-09-15: 40 mg via SUBCUTANEOUS

## 2018-09-15 MED ORDER — LIDOCAINE 2% (20 MG/ML) 5 ML SYRINGE
INTRAMUSCULAR | Status: AC
  Filled 2018-09-15: qty 5

## 2018-09-15 MED ORDER — TRAMADOL HCL 50 MG PO TABS
50.0000 mg | ORAL_TABLET | Freq: Two times a day (BID) | ORAL | Status: DC
  Administered 2018-09-15 – 2018-09-16 (×2): 50 mg via ORAL
  Filled 2018-09-15 (×2): qty 1

## 2018-09-15 MED ORDER — KCL IN DEXTROSE-NACL 20-5-0.45 MEQ/L-%-% IV SOLN
INTRAVENOUS | Status: DC
  Administered 2018-09-15: 19:00:00 via INTRAVENOUS
  Filled 2018-09-15 (×2): qty 1000

## 2018-09-15 MED ORDER — CEFAZOLIN SODIUM-DEXTROSE 2-4 GM/100ML-% IV SOLN
2.0000 g | INTRAVENOUS | Status: AC
  Administered 2018-09-15: 2 g via INTRAVENOUS

## 2018-09-15 MED ORDER — ROCURONIUM BROMIDE 10 MG/ML (PF) SYRINGE
PREFILLED_SYRINGE | INTRAVENOUS | Status: DC | PRN
  Administered 2018-09-15: 20 mg via INTRAVENOUS
  Administered 2018-09-15: 10 mg via INTRAVENOUS
  Administered 2018-09-15: 50 mg via INTRAVENOUS
  Administered 2018-09-15: 20 mg via INTRAVENOUS

## 2018-09-15 MED ORDER — CEFAZOLIN SODIUM-DEXTROSE 2-3 GM-%(50ML) IV SOLR
INTRAVENOUS | Status: DC | PRN
  Administered 2018-09-15: 2 g via INTRAVENOUS

## 2018-09-15 MED ORDER — HYDROMORPHONE HCL 1 MG/ML IJ SOLN
INTRAMUSCULAR | Status: AC
  Filled 2018-09-15: qty 1

## 2018-09-15 MED ORDER — FENTANYL CITRATE (PF) 250 MCG/5ML IJ SOLN
INTRAMUSCULAR | Status: DC | PRN
  Administered 2018-09-15 (×3): 50 ug via INTRAVENOUS
  Administered 2018-09-15: 100 ug via INTRAVENOUS
  Administered 2018-09-15: 50 ug via INTRAVENOUS
  Administered 2018-09-15: 100 ug via INTRAVENOUS
  Administered 2018-09-15 (×2): 50 ug via INTRAVENOUS

## 2018-09-15 MED ORDER — EVICEL 5 ML EX KIT
PACK | CUTANEOUS | Status: DC | PRN
  Administered 2018-09-15: 5 mL

## 2018-09-15 MED ORDER — EPHEDRINE 5 MG/ML INJ
INTRAVENOUS | Status: AC
  Filled 2018-09-15: qty 10

## 2018-09-15 MED ORDER — LACTATED RINGERS IV SOLN
INTRAVENOUS | Status: DC
  Administered 2018-09-15 (×3): via INTRAVENOUS

## 2018-09-15 MED ORDER — EVICEL 5 ML EX KIT
PACK | CUTANEOUS | Status: AC
  Filled 2018-09-15: qty 1

## 2018-09-15 MED ORDER — DIPHENHYDRAMINE HCL 12.5 MG/5ML PO ELIX: 12.5000 mg | ORAL_SOLUTION | Freq: Four times a day (QID) | ORAL | Status: DC | PRN

## 2018-09-15 MED ORDER — CEFAZOLIN SODIUM-DEXTROSE 2-4 GM/100ML-% IV SOLN
INTRAVENOUS | Status: AC
  Filled 2018-09-15: qty 100

## 2018-09-15 MED ORDER — MORPHINE SULFATE (PF) 2 MG/ML IV SOLN
2.0000 mg | Freq: Once | INTRAVENOUS | Status: AC
  Administered 2018-09-15: 2 mg via INTRAVENOUS

## 2018-09-15 MED ORDER — DEXAMETHASONE SODIUM PHOSPHATE 10 MG/ML IJ SOLN
INTRAMUSCULAR | Status: AC
  Filled 2018-09-15: qty 1

## 2018-09-15 MED ORDER — BUPIVACAINE-EPINEPHRINE (PF) 0.25% -1:200000 IJ SOLN
INTRAMUSCULAR | Status: AC
  Filled 2018-09-15: qty 30

## 2018-09-15 MED ORDER — ONDANSETRON HCL 4 MG/2ML IJ SOLN
INTRAMUSCULAR | Status: DC | PRN
  Administered 2018-09-15: 4 mg via INTRAVENOUS

## 2018-09-15 MED ORDER — MIDAZOLAM HCL 2 MG/2ML IJ SOLN
INTRAMUSCULAR | Status: AC
  Filled 2018-09-15: qty 2

## 2018-09-15 MED ORDER — DIPHENHYDRAMINE HCL 50 MG/ML IJ SOLN: 12.5000 mg | Freq: Four times a day (QID) | INTRAMUSCULAR | Status: DC | PRN

## 2018-09-15 MED ORDER — ONDANSETRON HCL 4 MG PO TABS: 4.0000 mg | ORAL_TABLET | Freq: Every day | ORAL | 1 refills | Status: DC | PRN

## 2018-09-15 MED ORDER — PHENYLEPHRINE 40 MCG/ML (10ML) SYRINGE FOR IV PUSH (FOR BLOOD PRESSURE SUPPORT)
PREFILLED_SYRINGE | INTRAVENOUS | Status: AC
  Filled 2018-09-15: qty 10

## 2018-09-15 MED ORDER — ACETAMINOPHEN 500 MG PO TABS: 1000.0000 mg | ORAL_TABLET | ORAL | Status: DC

## 2018-09-15 MED ORDER — BUPIVACAINE-EPINEPHRINE 0.25% -1:200000 IJ SOLN
INTRAMUSCULAR | Status: DC | PRN
  Administered 2018-09-15: 30 mL

## 2018-09-15 MED ORDER — KETAMINE HCL 10 MG/ML IJ SOLN
INTRAMUSCULAR | Status: DC | PRN
  Administered 2018-09-15 (×2): 20 mg via INTRAVENOUS
  Administered 2018-09-15: 10 mg via INTRAVENOUS

## 2018-09-15 MED ORDER — SENNA 8.6 MG PO TABS
1.0000 | ORAL_TABLET | Freq: Two times a day (BID) | ORAL | Status: DC
  Administered 2018-09-15 – 2018-09-16 (×2): 8.6 mg via ORAL
  Filled 2018-09-15 (×2): qty 1

## 2018-09-15 SURGICAL SUPPLY — 76 items
APPLIER CLIP 9.375 MED OPEN (MISCELLANEOUS) ×2
BAG DECANTER FOR FLEXI CONT (MISCELLANEOUS) ×2 IMPLANT
BINDER BREAST XXLRG (GAUZE/BANDAGES/DRESSINGS) ×2 IMPLANT
BIOPATCH RED 1 DISK 7.0 (GAUZE/BANDAGES/DRESSINGS) ×4 IMPLANT
BLADE SURG 10 STRL SS (BLADE) ×2 IMPLANT
BLADE SURG 15 STRL LF DISP TIS (BLADE) ×1 IMPLANT
BLADE SURG 15 STRL SS (BLADE) ×1
BNDG COHESIVE 4X5 TAN STRL (GAUZE/BANDAGES/DRESSINGS) IMPLANT
CANISTER SUCT 3000ML PPV (MISCELLANEOUS) ×2 IMPLANT
CHLORAPREP W/TINT 26ML (MISCELLANEOUS) ×4 IMPLANT
CLIP APPLIE 9.375 MED OPEN (MISCELLANEOUS) ×1 IMPLANT
CONNECTOR 5 IN 1 STRAIGHT STRL (MISCELLANEOUS) ×2 IMPLANT
COVER SURGICAL LIGHT HANDLE (MISCELLANEOUS) ×4 IMPLANT
COVER WAND RF STERILE (DRAPES) ×2 IMPLANT
DECANTER SPIKE VIAL GLASS SM (MISCELLANEOUS) ×2 IMPLANT
DERMABOND ADVANCED (GAUZE/BANDAGES/DRESSINGS) ×2
DERMABOND ADVANCED .7 DNX12 (GAUZE/BANDAGES/DRESSINGS) ×2 IMPLANT
DRAIN CHANNEL 19F RND (DRAIN) ×4 IMPLANT
DRAPE HALF SHEET 40X57 (DRAPES) ×4 IMPLANT
DRAPE INCISE 23X17 IOBAN STRL (DRAPES)
DRAPE INCISE IOBAN 23X17 STRL (DRAPES) IMPLANT
DRAPE INCISE IOBAN 85X60 (DRAPES) IMPLANT
DRAPE ORTHO SPLIT 77X108 STRL (DRAPES) ×4
DRAPE SURG ORHT 6 SPLT 77X108 (DRAPES) ×4 IMPLANT
DRAPE WARM FLUID 44X44 (DRAPE) ×2 IMPLANT
DRESSING ALLEVYN LIFE SACRUM (GAUZE/BANDAGES/DRESSINGS) ×2 IMPLANT
DRSG MEPILEX BORDER 4X8 (GAUZE/BANDAGES/DRESSINGS) ×2 IMPLANT
DRSG PAD ABDOMINAL 8X10 ST (GAUZE/BANDAGES/DRESSINGS) ×2 IMPLANT
ELECT BLADE 4.0 EZ CLEAN MEGAD (MISCELLANEOUS) ×2
ELECT BLADE 6.5 EXT (BLADE) IMPLANT
ELECT CAUTERY BLADE 6.4 (BLADE) ×4 IMPLANT
ELECT REM PT RETURN 9FT ADLT (ELECTROSURGICAL) ×2
ELECTRODE BLDE 4.0 EZ CLN MEGD (MISCELLANEOUS) ×1 IMPLANT
ELECTRODE REM PT RTRN 9FT ADLT (ELECTROSURGICAL) ×1 IMPLANT
EVACUATOR SILICONE 100CC (DRAIN) ×4 IMPLANT
GAUZE SPONGE 4X4 12PLY STRL (GAUZE/BANDAGES/DRESSINGS) ×2 IMPLANT
GLOVE BIO SURGEON STRL SZ 6.5 (GLOVE) ×10 IMPLANT
GLOVE BIOGEL PI IND STRL 7.0 (GLOVE) ×1 IMPLANT
GLOVE BIOGEL PI INDICATOR 7.0 (GLOVE) ×1
GLOVE SS BIOGEL STRL SZ 7 (GLOVE) ×1 IMPLANT
GLOVE SUPERSENSE BIOGEL SZ 7 (GLOVE) ×1
GOWN STRL REUS W/ TWL LRG LVL3 (GOWN DISPOSABLE) ×6 IMPLANT
GOWN STRL REUS W/TWL LRG LVL3 (GOWN DISPOSABLE) ×6
IMPL EXPANDER BREAST 375CC (Breast) ×1 IMPLANT
IMPLANT BREAST 375CC (Breast) ×1 IMPLANT
IMPLANT EXPANDER BREAST 375CC (Breast) ×1 IMPLANT
KIT BASIN OR (CUSTOM PROCEDURE TRAY) ×2 IMPLANT
NEEDLE 22X1 1/2 (OR ONLY) (NEEDLE) ×2 IMPLANT
NEEDLE HYPO 25GX1X1/2 BEV (NEEDLE) ×2 IMPLANT
NS IRRIG 1000ML POUR BTL (IV SOLUTION) ×4 IMPLANT
PACK GENERAL/GYN (CUSTOM PROCEDURE TRAY) ×2 IMPLANT
PAD ARMBOARD 7.5X6 YLW CONV (MISCELLANEOUS) ×6 IMPLANT
PENCIL BUTTON HOLSTER BLD 10FT (ELECTRODE) ×2 IMPLANT
PENCIL SMOKE EVACUATOR (MISCELLANEOUS) ×2 IMPLANT
PIN SAFETY STERILE (MISCELLANEOUS) ×2 IMPLANT
SPONGE LAP 18X18 RF (DISPOSABLE) ×2 IMPLANT
SPONGE LAP 18X18 X RAY DECT (DISPOSABLE) IMPLANT
STAPLER VISISTAT 35W (STAPLE) ×2 IMPLANT
STOCKINETTE IMPERVIOUS 9X36 MD (GAUZE/BANDAGES/DRESSINGS) IMPLANT
STOPCOCK 4 WAY LG BORE MALE ST (IV SETS) IMPLANT
STRIP CLOSURE SKIN 1/2X4 (GAUZE/BANDAGES/DRESSINGS) IMPLANT
SUT ETHILON 2 0 FS 18 (SUTURE) ×6 IMPLANT
SUT MNCRL AB 3-0 PS2 18 (SUTURE) ×20 IMPLANT
SUT MNCRL AB 4-0 PS2 18 (SUTURE) ×4 IMPLANT
SUT MON AB 5-0 PS2 18 (SUTURE) ×10 IMPLANT
SUT PDS AB 3-0 SH 27 (SUTURE) ×2 IMPLANT
SUT VIC AB 3-0 PS2 18 (SUTURE) ×8
SUT VIC AB 3-0 PS2 18XBRD (SUTURE) ×8 IMPLANT
SUT VIC AB 3-0 SH 8-18 (SUTURE) ×2 IMPLANT
SYR BULB IRRIGATION 50ML (SYRINGE) ×2 IMPLANT
SYR CONTROL 10ML LL (SYRINGE) ×2 IMPLANT
TOWEL GREEN STERILE (TOWEL DISPOSABLE) ×2 IMPLANT
TOWEL GREEN STERILE FF (TOWEL DISPOSABLE) ×2 IMPLANT
TRAY FOLEY MTR SLVR 14FR STAT (SET/KITS/TRAYS/PACK) IMPLANT
TUBE CONNECTING 12X1/4 (SUCTIONS) ×2 IMPLANT
YANKAUER SUCT BULB TIP NO VENT (SUCTIONS) ×2 IMPLANT

## 2018-09-15 NOTE — Anesthesia Procedure Notes (Signed)
Procedure Name: Intubation Date/Time: 09/15/2018 11:33 AM Performed by: Oletta Lamas, CRNA Pre-anesthesia Checklist: Patient identified, Emergency Drugs available, Suction available and Patient being monitored Patient Re-evaluated:Patient Re-evaluated prior to induction Oxygen Delivery Method: Circle System Utilized Preoxygenation: Pre-oxygenation with 100% oxygen Induction Type: IV induction Ventilation: Mask ventilation without difficulty Laryngoscope Size: Miller and 2 Grade View: Grade I Tube type: Oral Tube size: 7.0 mm Number of attempts: 1 Airway Equipment and Method: Stylet Placement Confirmation: ETT inserted through vocal cords under direct vision,  positive ETCO2 and breath sounds checked- equal and bilateral Secured at: 21 cm Tube secured with: Tape Dental Injury: Teeth and Oropharynx as per pre-operative assessment

## 2018-09-15 NOTE — Anesthesia Preprocedure Evaluation (Signed)
Anesthesia Evaluation  Patient identified by MRN, date of birth, ID band Patient awake    Reviewed: Allergy & Precautions, NPO status , Patient's Chart, lab work & pertinent test results  History of Anesthesia Complications Negative for: history of anesthetic complications  Airway Mallampati: II  TM Distance: >3 FB Neck ROM: Full    Dental  (+) Teeth Intact, Dental Advisory Given   Pulmonary neg pulmonary ROS,    breath sounds clear to auscultation       Cardiovascular Exercise Tolerance: Good negative cardio ROS   Rhythm:Regular Rate:Normal     Neuro/Psych  Neuromuscular disease    GI/Hepatic negative GI ROS, Neg liver ROS,   Endo/Other     Renal/GU negative Renal ROS     Musculoskeletal negative musculoskeletal ROS (+)   Abdominal (+) + obese,   Peds  Hematology negative hematology ROS (+)   Anesthesia Other Findings   Reproductive/Obstetrics                             Anesthesia Physical  Anesthesia Plan  ASA: II  Anesthesia Plan: General   Post-op Pain Management:    Induction: Intravenous  PONV Risk Score and Plan: 3 and Dexamethasone, Ondansetron, Treatment may vary due to age or medical condition and Midazolam  Airway Management Planned: Oral ETT  Additional Equipment:   Intra-op Plan:   Post-operative Plan: Extubation in OR  Informed Consent: I have reviewed the patients History and Physical, chart, labs and discussed the procedure including the risks, benefits and alternatives for the proposed anesthesia with the patient or authorized representative who has indicated his/her understanding and acceptance.   Dental advisory given  Plan Discussed with: CRNA  Anesthesia Plan Comments:         Anesthesia Quick Evaluation

## 2018-09-15 NOTE — Plan of Care (Signed)
  Problem: Pain Managment: Goal: General experience of comfort will improve Outcome: Progressing   Problem: Skin Integrity: Goal: Risk for impaired skin integrity will decrease Outcome: Progressing   Problem: Education: Goal: Knowledge of disease or condition will improve Outcome: Progressing   Problem: Activity: Goal: Ability to maintain or regain function will improve Outcome: Progressing   Problem: Clinical Measurements: Goal: Postoperative complications will be avoided or minimized Outcome: Progressing   Problem: Self-Concept: Goal: Ability to verbalize positive feelings about self will improve Outcome: Progressing   Problem: Skin Integrity: Goal: Demonstration of wound healing without infection will improve Outcome: Progressing

## 2018-09-15 NOTE — Interval H&P Note (Signed)
History and Physical Interval Note:  09/15/2018 10:54 AM  Cheryl [name]  has presented today for surgery, with the diagnosis of acquired absence left breast  The various methods of treatment have been discussed with the patient and family. After consideration of risks, benefits and other options for treatment, the patient has consented to  Procedure(s): LATISSIMUS FLAP TO LEFT BREAST, PLACEMENT OF EXPANDER, REMOVAL OF IMPLANT (Left) as a surgical intervention .  The patient's history has been reviewed, patient examined, no change in status, stable for surgery.  I have reviewed the patient's chart and labs.  Questions were answered to the patient's satisfaction.     Cheryl [name] Cheryl [name]

## 2018-09-15 NOTE — Op Note (Signed)
DATE OF OPERATION: 09/15/2018  LOCATION: [facility]  PREOPERATIVE DIAGNOSIS:  1. Breast Cancer Status post Left mastectomy. 2.   Acquired absence of Left Breast. 3.   Near extrusion of left breast implant  POSTOPERATIVE DIAGNOSIS: Same  PROCEDURE: 1. Latissimus myocutaneous flap to reconstruct the left breast CPT 19361 2. Tissue expander placement CPT 19357  SURGEON: Jarielys Girardot [place] Davidmichael Zarazua, DO  EBL: 150 cc  EXPANDER: Ref:  [id number], SN: [id number], 375 cc (100 cc placed)  DRAINS: 3 total 72 blake round drains  CONDITION: Stable  COMPLICATIONS: None  INDICATION: The patient, Cheryl [name], is a 42 y.o. female born on [birth year], is here for treatment after bilateral mastectomies with resulting absence of breast and asymmetry.  She did very well with the right reconstruction.  The left side required radiation and has therefore been challenging.  The implant was placed and the breast was doing well until the patient noted swelling and extreme thinning of the lateral skin.  The concern was for immenent extrusion of the implant.  PROCEDURE DETAILS:  The patient was seen on the morning of her surgery and marked for the location of the flap.  An IV was placed and IV antibiotics were given. The patient was taken to the operating room and placed on the operating room table.  A general anesthetic was administered.  A standard time out was performed and all information was confirmed to be correct by those in the room. Leg compression devices were placed on her legs.  The patient was placed in the right lateral decubitus position on a beanbag.  All key prominent points were padded and an axillary roll was placed in the dependent axilla. The ipsilateral arm was placed on a padded rest anteriosuperiorly.  She was then prepped and draped in the standard sterile fashion. The paddle design and position were confirmed.   The procedure began by incising the skin at the marked skin  paddle.  The #10 blade and bovie were used to dissect down to the latissimus muscle.  Anterior and posterior flaps were raised to expose the latissimus muscle edges.  The skin and fat flaps were elevated to the extent necessary for release of the muscle.  The muscle was released at the superior edge of the inferior angle of the scapula.  This aids in identification of the serratus muscle to prevent lifting it with the latissimus muscle.  The larger caliber perforator were clipped and the smaller vessels controlled with electrocautery.  The dissection continued toward the midline and inferior toward the iliac crest.  The subscapular artery to the thoracodorsal artery was palpated in the axilla.  The branch to the serratus is ligated.  Care was taken to protect the vascular pedicle throughout this portion of the procedure. The paddle and muscle looked healthy throughout the case.  The old mastectomy scar was excised and the flaps on top of the pectoralis muscle were raised.  The posterior and anterior pockets were then connected in the plane above the muscle. The muscle from the back and the skin paddle were then rotated into the chest pocket and covered with an ioban dressing. The flap and skin pedicle were inspected and there was no tension. The back pocket was hemostased and Evicel placed. Two #19 blake round drains were place in the anterior skin flap and secured with 3-0 Silk. One drain was positioned inferiorly and one superiorly.  The back incision was closed in layers with buried 3-0 Vicryl, followed by 4-0 Monocryl  and 5-0 Monocryl.  Dermabond and a protective dressing was applied.   The patient was repositioned onto her back and the chest was prepped and draped. The previous implant was removed. The breast pocket was inspected and hemostases was achieved with electrocautery. The muscle was then secured superiorly to the pectoralis muscle with 3-0 Monocryl. A 375 cc expander was chosen. It was soaked in  triple antibiotic solution and evacuated of air and then filled with 100 cc of sterile saline. The inferior portion of the muscle was tacked to the inframammary fold with 3-0 Monocryl.  One drain was placed on this side and secured with 3-0 Silk. The flap was then closed with 3-0 Monocryl deep, followed by 4-0 Monocryl and the skin closed with 5-0 Monocryl.  Dermabond, ABDs and a breast binder was applied.   The patient was allowed to wake up and taken to recovery room in stable condition at the end of the case. The family was notified at the end of the case.

## 2018-09-15 NOTE — Addendum Note (Signed)
Addended by: Wallace Going on: 09/15/2018 03:59 PM   Modules accepted: Orders

## 2018-09-15 NOTE — Transfer of Care (Signed)
Immediate Anesthesia Transfer of Care Note  Patient: Cheryl [name]  Procedure(s) Performed: LATISSIMUS FLAP TO LEFT BREAST (Left Breast) PLACEMENT OF TISSUE EXPANDER (Left Breast) REMOVAL BREAST IMPLANTS (Left Breast)  Patient Location: PACU  Anesthesia Type:General  Level of Consciousness: awake, alert  and oriented  Airway & Oxygen Therapy: Patient Spontanous Breathing  Post-op Assessment: Report given to RN and Post -op Vital signs reviewed and stable  Post vital signs: Reviewed and stable  Last Vitals:  Vitals Value Taken Time  BP 118/75 09/15/2018  3:33 PM  Temp    Pulse 98 09/15/2018  3:39 PM  Resp 19 09/15/2018  3:39 PM  SpO2 100 % 09/15/2018  3:39 PM  Vitals shown include unvalidated device data.  Last Pain:  Vitals:   09/15/18 0926  TempSrc:   PainSc: 0-No pain         Complications: No apparent anesthesia complications

## 2018-09-16 ENCOUNTER — Encounter (HOSPITAL_COMMUNITY): Payer: Self-pay | Admitting: Plastic Surgery

## 2018-09-16 ENCOUNTER — Telehealth: Payer: Self-pay | Admitting: Plastic Surgery

## 2018-09-16 LAB — HIV ANTIBODY (ROUTINE TESTING W REFLEX): HIV Screen 4th Generation wRfx: NONREACTIVE

## 2018-09-16 MED ORDER — HYDROCODONE-ACETAMINOPHEN 5-325 MG PO TABS: 1.0000 | ORAL_TABLET | Freq: Four times a day (QID) | ORAL | 0 refills | Status: DC | PRN

## 2018-09-16 MED ORDER — DIAZEPAM 2 MG PO TABS: 2.0000 mg | ORAL_TABLET | Freq: Four times a day (QID) | ORAL | 0 refills | Status: DC | PRN

## 2018-09-16 NOTE — Addendum Note (Signed)
Addended by: Wallace Going on: 09/16/2018 01:16 PM   Modules accepted: Orders

## 2018-09-16 NOTE — Telephone Encounter (Signed)
Patient called after being discharged from hospital. Patient states that she spoke with Dr. Marla Roe about muscle relaxers. Patient did not receive any from pharmacy, and wants call back concerning same. Patient is also expressing concern about the amount of pain medication she received. Patient wanting to confirm how much pain medication she should have to take home after surgery.

## 2018-09-16 NOTE — Progress Notes (Signed)
Dr. Marla Roe office informed about the pt went home without discharge order yet, I spoke to Cheryl [name].

## 2018-09-16 NOTE — Progress Notes (Signed)
Pt for discharge going home, waiting for the MD to put orders for discharge but I noticed she is not in her room, I tried to call her but unsuccessful I left a message , I even call emergency contact fiancee, he said he will give me a call,

## 2018-09-16 NOTE — Progress Notes (Signed)
Dr. Marla Roe did the discharge summary but still need to reconcile, I left message to said MD still waiting for response.

## 2018-09-16 NOTE — Progress Notes (Signed)
I go over the discharge instructions health teachings, next appointment, prescription to be pick up in the pharmacy,due med explained and understood to the pt over the phone but her fiancee will pick up the form anytime today.

## 2018-09-16 NOTE — Discharge Summary (Signed)
Physician Discharge Summary  Patient ID: Cheryl [name] MRN: [id number] DOB/AGE: 1976-02-14 42 y.o.  Admit date: 09/15/2018 Discharge date: 09/16/2018  Admission Diagnoses:  Discharge Diagnoses:  Active Problems:   Acquired absence of left breast   Discharged Condition: good  Hospital Course: The patient underwent a left latissimus breast reconstruction with expander placement.  She did very well during the surgery and was managed on the surgical unit postoperatively.  She was walking, eating and her pain was well controlled. She was asking to go home.  Consults: None  Significant Diagnostic Studies: none  Treatments: surgery  Discharge Exam: Blood pressure 114/72, pulse 93, temperature 98.2 F (36.8 C), temperature source Oral, resp. rate 17, height 5\' 2"  (1.575 m), weight 100 kg, last menstrual period 04/02/2015, SpO2 98 %. General appearance: alert, cooperative and no distress Incision/Wound:  Disposition: Discharge disposition: 01-Home or Self Care       Discharge Instructions    Call MD for:  persistant nausea and vomiting   Complete by:  As directed    Call MD for:  redness, tenderness, or signs of infection (pain, swelling, redness, odor or green/yellow discharge around incision site)   Complete by:  As directed    Call MD for:  severe uncontrolled pain   Complete by:  As directed    Diet general   Complete by:  As directed    Discharge wound care:   Complete by:  As directed    drain care   Driving Restrictions   Complete by:  As directed    No driving while on pain meds.  Recommend no driving for next week.   Increase activity slowly   Complete by:  As directed    Lifting restrictions   Complete by:  As directed    No lifting more than 5 pounds      Follow-up Information    Jaquavious Mercer, [name], DO In 1 week.   Specialty:  Plastic Surgery Contact information: [place] [address] [phone]            Signed: [place] 09/16/2018, 10:46 AM

## 2018-09-16 NOTE — Progress Notes (Signed)
I spoke to the patient and according to her she went home, I informed the charge nurse and said I can give  Discharge instruction over the phone, I'm still waiting for discharge order, according to Dr. Marla Roe she is still working her discharge order.

## 2018-09-22 ENCOUNTER — Encounter: Payer: 59 | Admitting: Plastic Surgery

## 2018-09-23 ENCOUNTER — Encounter: Payer: 59 | Admitting: Plastic Surgery

## 2018-09-23 ENCOUNTER — Ambulatory Visit (INDEPENDENT_AMBULATORY_CARE_PROVIDER_SITE_OTHER): Payer: 59 | Admitting: Plastic Surgery

## 2018-09-23 ENCOUNTER — Encounter: Payer: Self-pay | Admitting: Plastic Surgery

## 2018-09-23 DIAGNOSIS — Z9012 Acquired absence of left breast and nipple: Secondary | ICD-10-CM

## 2018-09-23 DIAGNOSIS — C50412 Malignant neoplasm of upper-outer quadrant of left female breast: Secondary | ICD-10-CM

## 2018-09-23 DIAGNOSIS — C50912 Malignant neoplasm of unspecified site of left female breast: Secondary | ICD-10-CM | POA: Diagnosis not present

## 2018-09-23 DIAGNOSIS — Z9013 Acquired absence of bilateral breasts and nipples: Secondary | ICD-10-CM

## 2018-09-23 DIAGNOSIS — Z171 Estrogen receptor negative status [ER-]: Secondary | ICD-10-CM

## 2018-09-23 DIAGNOSIS — Z9882 Breast implant status: Secondary | ICD-10-CM

## 2018-09-23 DIAGNOSIS — Z9889 Other specified postprocedural states: Secondary | ICD-10-CM

## 2018-09-23 DIAGNOSIS — Z9011 Acquired absence of right breast and nipple: Secondary | ICD-10-CM | POA: Diagnosis not present

## 2018-09-23 MED ORDER — HYDROCODONE-ACETAMINOPHEN 5-325 MG PO TABS: 1.0000 | ORAL_TABLET | ORAL | 0 refills | Status: DC | PRN

## 2018-09-23 MED ORDER — DIAZEPAM 2 MG PO TABS: 2.0000 mg | ORAL_TABLET | Freq: Two times a day (BID) | ORAL | 0 refills | Status: AC | PRN

## 2018-09-23 NOTE — Progress Notes (Signed)
   Subjective:    Patient ID: Cheryl [name], female    DOB: [birth year], 42 y.o.   MRN: [id number]  The patient is a 42 yrs old wf here for follow up on her left breast reconstruction with latissimus and expander placement.  [name] doing very well.  No fevers or signs of infection.  Her pain is well controlled with the medication.  [name] pleased with her progress.  The flap is alive with excellent capillary refill.  Drain output as expected.     Review of Systems  Constitutional: Negative.  Negative for activity change.  HENT: Negative.   Eyes: Negative.   Respiratory: Negative.   Cardiovascular: Negative.   Genitourinary: Negative.   Skin: Negative for color change and wound.  Hematological: Negative.   Psychiatric/Behavioral: Negative.       Objective:   Physical Exam  Constitutional: She appears well-developed and well-nourished.  HENT:  Head: Normocephalic and atraumatic.  Cardiovascular: Normal rate.  Pulmonary/Chest: Effort normal.  Neurological: [name] alert.  Skin: Skin is warm.  Psychiatric: She has a normal mood and affect. Her behavior is normal. Judgment and thought content normal.       Assessment & Plan:  Malignant neoplasm of upper-outer quadrant of left breast in female, estrogen receptor negative ([facility])  Recurrent breast cancer, left ([facility])  Acquired absence of bilateral breasts and nipples  S/P breast reconstruction  Acquired absence of left breast  The superior back drain was removed.  We will plan to remove another one next week.

## 2018-09-23 NOTE — Anesthesia Postprocedure Evaluation (Signed)
Anesthesia Post Note  Patient: Cheryl [name]  Procedure(s) Performed: LATISSIMUS FLAP TO LEFT BREAST (Left Breast) PLACEMENT OF TISSUE EXPANDER (Left Breast) REMOVAL BREAST IMPLANTS (Left Breast)     Patient location during evaluation: PACU Anesthesia Type: General Level of consciousness: awake and alert Pain management: pain level controlled Vital Signs Assessment: post-procedure vital signs reviewed and stable Respiratory status: spontaneous breathing, nonlabored ventilation, respiratory function stable and patient connected to nasal cannula oxygen Cardiovascular status: blood pressure returned to baseline and stable Postop Assessment: no apparent nausea or vomiting Anesthetic complications: no    Last Vitals:  Vitals:   09/16/18 0519 09/16/18 1005  BP: 117/77 114/72  Pulse: 84 93  Resp: 18 17  Temp: 36.5 C 36.8 C  SpO2: 98% 98%    Last Pain:  Vitals:   09/16/18 1005  TempSrc: Oral  PainSc:                  Catalina Gravel

## 2018-09-30 ENCOUNTER — Encounter: Payer: Self-pay | Admitting: Plastic Surgery

## 2018-09-30 ENCOUNTER — Ambulatory Visit (INDEPENDENT_AMBULATORY_CARE_PROVIDER_SITE_OTHER): Payer: 59 | Admitting: Plastic Surgery

## 2018-09-30 VITALS — BP 132/76 | HR 99 | Ht 62.0 in | Wt 207.0 lb

## 2018-09-30 DIAGNOSIS — Z9889 Other specified postprocedural states: Secondary | ICD-10-CM

## 2018-09-30 DIAGNOSIS — Z9882 Breast implant status: Secondary | ICD-10-CM

## 2018-09-30 DIAGNOSIS — C50412 Malignant neoplasm of upper-outer quadrant of left female breast: Secondary | ICD-10-CM

## 2018-09-30 DIAGNOSIS — Z171 Estrogen receptor negative status [ER-]: Secondary | ICD-10-CM

## 2018-09-30 DIAGNOSIS — Z9013 Acquired absence of bilateral breasts and nipples: Secondary | ICD-10-CM

## 2018-09-30 DIAGNOSIS — Z9012 Acquired absence of left breast and nipple: Secondary | ICD-10-CM

## 2018-09-30 NOTE — Progress Notes (Signed)
  Subjective:     Patient ID: Cheryl [name], female   DOB: June 30, 1976, 42 y.o.   MRN: [id number]  HPI Very pleasant 41 year old female pt presents to the clinic s/p left breast reconstruction with latissimus and expender placement.  [name] doing well.  She reports pain 5/10 where the drains are placed.  Denies pain of the breast of posterior thorax incision.  She denies fevers, chills and night sweats.  She reports drain output of 5 an15 and 30cc  Review of Systems  Constitutional: Negative.   HENT: Negative.   Eyes: Negative.   Respiratory: Negative.   Cardiovascular: Negative.   Gastrointestinal: Negative.   Musculoskeletal: Negative.   Psychiatric/Behavioral: Negative.        Objective:   Physical Exam  Constitutional: [name] oriented to person, place, and time. She appears well-developed and well-nourished.  HENT:  Head: Normocephalic.  Neck: Normal range of motion.  Pulmonary/Chest: Effort normal.  Abdominal: Soft.  Musculoskeletal: Normal range of motion.  Neurological: [name] alert and oriented to person, place, and time.  Skin: Skin is warm and dry.  Psychiatric: She has a normal mood and affect. Her behavior is normal. Judgment and thought content normal.  visualized posterior wound no edema, no erythema, no sign of infection Pulled both drains, placed dressing     Assessment:     S/p left breast reconstruction with latissimus and extender placed    Plan:     Pt will return in 1 week

## 2018-10-07 ENCOUNTER — Encounter: Payer: Self-pay | Admitting: Plastic Surgery

## 2018-10-07 ENCOUNTER — Ambulatory Visit (INDEPENDENT_AMBULATORY_CARE_PROVIDER_SITE_OTHER): Payer: 59 | Admitting: Plastic Surgery

## 2018-10-07 VITALS — HR 70 | Resp 14

## 2018-10-07 DIAGNOSIS — Z9013 Acquired absence of bilateral breasts and nipples: Secondary | ICD-10-CM

## 2018-10-07 DIAGNOSIS — Z9012 Acquired absence of left breast and nipple: Secondary | ICD-10-CM

## 2018-10-07 HISTORY — DX: Acquired absence of bilateral breasts and nipples: Z90.13

## 2018-10-07 NOTE — Progress Notes (Signed)
   Subjective:    Patient ID: Cheryl [name], female    DOB: March 10, 1976, 42 y.o.   MRN: [id number]  The patient is a 42 yrs old wf here for follow up after left breast reconstruction with expander and Latissimus muscle flap placement 10/24.  The left breast is healing nicely at the incision.  There is no sign of seroma or hematoma.  [name] getting her strength back and pain is well controlled.  She has fluid in the back space.  This was drained.  No sign of infection.         Review of Systems  Constitutional: Negative.   HENT: Negative.   Eyes: Negative.   Respiratory: Negative.   Cardiovascular: Negative.   Gastrointestinal: Negative.   Endocrine: Negative.   Genitourinary: Negative for difficulty urinating.  Neurological: Negative.   Hematological: Negative.        Objective:   Physical Exam  Constitutional: She appears well-developed and well-nourished.  HENT:  Head: Normocephalic and atraumatic.  Eyes: Pupils are equal, round, and reactive to light.  Cardiovascular: Normal rate.  Pulmonary/Chest: Effort normal.  Neurological: [name] alert.  Skin: Skin is warm.       Assessment & Plan:  Acquired absence of bilateral breasts and nipples  Acquired absence of left breast  S/P mastectomy, bilateral  There was fluid in the back noted.  We drained 70 cc serosanginous fluid.  We placed injectable saline in the Expander using a sterile technique: Left: 50 cc for a total of 150 / 375 cc

## 2018-10-13 ENCOUNTER — Telehealth: Payer: Self-pay | Admitting: Oncology

## 2018-10-13 ENCOUNTER — Inpatient Hospital Stay: Payer: 59 | Attending: Oncology | Admitting: Oncology

## 2018-10-13 ENCOUNTER — Inpatient Hospital Stay: Payer: 59

## 2018-10-13 NOTE — Telephone Encounter (Signed)
Left message per 11/21 sch message -

## 2018-10-14 ENCOUNTER — Encounter: Payer: Self-pay | Admitting: Plastic Surgery

## 2018-10-14 ENCOUNTER — Ambulatory Visit (INDEPENDENT_AMBULATORY_CARE_PROVIDER_SITE_OTHER): Payer: 59 | Admitting: Plastic Surgery

## 2018-10-14 VITALS — BP 130/80 | HR 96 | Resp 14

## 2018-10-14 DIAGNOSIS — C50912 Malignant neoplasm of unspecified site of left female breast: Secondary | ICD-10-CM | POA: Diagnosis not present

## 2018-10-14 DIAGNOSIS — Z9012 Acquired absence of left breast and nipple: Secondary | ICD-10-CM

## 2018-10-14 DIAGNOSIS — Z9013 Acquired absence of bilateral breasts and nipples: Secondary | ICD-10-CM

## 2018-10-14 DIAGNOSIS — Z9011 Acquired absence of right breast and nipple: Secondary | ICD-10-CM | POA: Diagnosis not present

## 2018-10-14 DIAGNOSIS — Z9889 Other specified postprocedural states: Secondary | ICD-10-CM

## 2018-10-14 DIAGNOSIS — Z9882 Breast implant status: Secondary | ICD-10-CM

## 2018-10-14 NOTE — Progress Notes (Signed)
   Subjective:    Patient ID: Cheryl [name], female    DOB: 11/22/1976, 42 y.o.   MRN: [id number]  The patient is a 41 year old white female here today with her mom for follow-up after a left latissimus muscle flap with expander placement.  Overall she is doing well and increasing her activity.  She may have increased it a little too much with some vacuuming but did use her right arm.  She has not felt ill or febrile over the past week.  There is no sign of cellulitis or seroma in the breast.  She does have fluid buildup in the back.  No sign of infection.     Review of Systems  Constitutional: Negative.   HENT: Negative.   Eyes: Negative.   Respiratory: Negative.   Cardiovascular: Negative.   Gastrointestinal: Negative.   Endocrine: Negative.   Genitourinary: Negative.   Musculoskeletal: Negative.   Skin: Negative.        Objective:   Physical Exam  Constitutional: She appears well-developed and well-nourished.  HENT:  Head: Normocephalic and atraumatic.  Eyes: Pupils are equal, round, and reactive to light.  Cardiovascular: Normal rate.  Pulmonary/Chest: Effort normal.  Abdominal: She exhibits no distension.  Neurological: [name] alert.  Psychiatric: She has a normal mood and affect. Her behavior is normal. Judgment and thought content normal.       Assessment & Plan:  S/P breast reconstruction  Acquired absence of left breast  S/P mastectomy, bilateral Plan for a follow-up in a week. Drained the back and removed 70 cc of serous drainage. We placed injectable saline in the Expander using a sterile technique: Left: 70 cc for a total of 220 / 375 cc

## 2018-10-24 DIAGNOSIS — R8290 Unspecified abnormal findings in urine: Secondary | ICD-10-CM | POA: Diagnosis not present

## 2018-10-25 ENCOUNTER — Ambulatory Visit (INDEPENDENT_AMBULATORY_CARE_PROVIDER_SITE_OTHER): Payer: 59 | Admitting: Plastic Surgery

## 2018-10-25 ENCOUNTER — Encounter: Payer: Self-pay | Admitting: Plastic Surgery

## 2018-10-25 VITALS — BP 135/94 | HR 85 | Resp 16 | Ht 63.0 in | Wt 230.0 lb

## 2018-10-25 DIAGNOSIS — Z9012 Acquired absence of left breast and nipple: Secondary | ICD-10-CM

## 2018-10-25 DIAGNOSIS — Z9889 Other specified postprocedural states: Secondary | ICD-10-CM

## 2018-10-25 DIAGNOSIS — Z9013 Acquired absence of bilateral breasts and nipples: Secondary | ICD-10-CM

## 2018-10-25 DIAGNOSIS — Z9882 Breast implant status: Secondary | ICD-10-CM

## 2018-10-25 NOTE — Progress Notes (Signed)
   Subjective:    Patient ID: Cheryl [name], female    DOB: January 17, 1976, 42 y.o.   MRN: [id number]  The patient is a 42 year old white female here for follow-up on her left breast reconstruction with latissimus and expander placement.  She had a good Thanksgiving.  Today there is no signs of infection seroma or hematoma in the left breast pocket.  She does have fluid in the back but no redness.  All incisions are healing well.   Review of Systems  Constitutional: Negative.  Negative for activity change.  HENT: Negative.   Respiratory: Negative.   Gastrointestinal: Negative.   Genitourinary: Negative.   Musculoskeletal: Negative.   Skin: Negative for color change and wound.       Objective:   Physical Exam  Constitutional: She appears well-developed and well-nourished.  HENT:  Head: Normocephalic and atraumatic.  Right Ear: External ear normal.  Cardiovascular: Normal rate.  Pulmonary/Chest: Effort normal. No stridor. No respiratory distress.  Neurological: [name] alert.  Skin: Skin is warm.  Psychiatric: She has a normal mood and affect.       Assessment & Plan:  Acquired absence of bilateral breasts and nipples  S/P mastectomy, bilateral  Acquired absence of left breast  S/P breast reconstruction We placed injectable saline in the Expander using a sterile technique: Left: 70 cc for a total of 290 / 375 cc Left back = drained 50 cc serous drainage

## 2018-10-25 NOTE — Progress Notes (Signed)
135/94 

## 2018-11-04 ENCOUNTER — Ambulatory Visit: Payer: 59 | Admitting: Family Medicine

## 2018-11-04 ENCOUNTER — Encounter: Payer: Self-pay | Admitting: Plastic Surgery

## 2018-11-04 ENCOUNTER — Ambulatory Visit (INDEPENDENT_AMBULATORY_CARE_PROVIDER_SITE_OTHER): Payer: 59 | Admitting: Plastic Surgery

## 2018-11-04 VITALS — BP 118/80 | HR 95 | Ht 62.0 in | Wt 218.0 lb

## 2018-11-04 DIAGNOSIS — Z9013 Acquired absence of bilateral breasts and nipples: Secondary | ICD-10-CM

## 2018-11-04 DIAGNOSIS — Z9882 Breast implant status: Secondary | ICD-10-CM

## 2018-11-04 DIAGNOSIS — Z9889 Other specified postprocedural states: Secondary | ICD-10-CM

## 2018-11-04 NOTE — Progress Notes (Signed)
   Subjective:    Patient ID: Cheryl [name], female    DOB: 1975-12-28, 42 y.o.   MRN: [id number]  Cheryl [name] is a 42 year old white female here for follow-up on her breast reconstruction.  She has a right implant in place and underwent a left latissimus flap with expander placement a month ago.  [name] doing very well.  [name] pleased with her progress and improving symmetry.  There is been no sign of infection.  All incisions are healing well.  She has a small seroma in her posterior left back area from where her latissimus was.  No sign of cellulitis.  Her pain is well controlled and she is getting ready for the holidays.   Review of Systems  Constitutional: Negative.   HENT: Negative.   Eyes: Negative.   Respiratory: Negative.   Genitourinary: Negative.   Musculoskeletal: Negative.   Skin: Negative.  Negative for color change and wound.  Psychiatric/Behavioral: Negative.       Objective:   Physical Exam Vitals signs reviewed.  HENT:     Head: Normocephalic.  Cardiovascular:     Rate and Rhythm: Normal rate.  Pulmonary:     Effort: Pulmonary effort is normal.  Neurological:     General: No focal deficit present.     Mental Status: [name] alert.  Psychiatric:        Mood and Affect: Mood normal.        Thought Content: Thought content normal.        Assessment & Plan:  S/P mastectomy, bilateral  S/P breast reconstruction  Acquired absence of bilateral breasts and nipples  We placed injectable saline in the Expander using a sterile technique: Right: 50 cc for a total of 340 / 375 cc

## 2018-11-12 DIAGNOSIS — H04123 Dry eye syndrome of bilateral lacrimal glands: Secondary | ICD-10-CM | POA: Diagnosis not present

## 2018-11-12 DIAGNOSIS — H40033 Anatomical narrow angle, bilateral: Secondary | ICD-10-CM | POA: Diagnosis not present

## 2018-11-18 ENCOUNTER — Ambulatory Visit: Payer: 59 | Admitting: Plastic Surgery

## 2018-11-18 ENCOUNTER — Ambulatory Visit (INDEPENDENT_AMBULATORY_CARE_PROVIDER_SITE_OTHER): Payer: 59 | Admitting: Family Medicine

## 2018-11-18 ENCOUNTER — Encounter: Payer: Self-pay | Admitting: Family Medicine

## 2018-11-18 VITALS — BP 127/86 | HR 83 | Temp 98.3°F | Ht 62.0 in | Wt 216.4 lb

## 2018-11-18 DIAGNOSIS — C50412 Malignant neoplasm of upper-outer quadrant of left female breast: Secondary | ICD-10-CM | POA: Diagnosis not present

## 2018-11-18 DIAGNOSIS — Z9013 Acquired absence of bilateral breasts and nipples: Secondary | ICD-10-CM

## 2018-11-18 DIAGNOSIS — R7302 Impaired glucose tolerance (oral): Secondary | ICD-10-CM | POA: Diagnosis not present

## 2018-11-18 DIAGNOSIS — E559 Vitamin D deficiency, unspecified: Secondary | ICD-10-CM

## 2018-11-18 DIAGNOSIS — Z9012 Acquired absence of left breast and nipple: Secondary | ICD-10-CM | POA: Diagnosis not present

## 2018-11-18 DIAGNOSIS — Z833 Family history of diabetes mellitus: Secondary | ICD-10-CM

## 2018-11-18 DIAGNOSIS — E7800 Pure hypercholesterolemia, unspecified: Secondary | ICD-10-CM

## 2018-11-18 DIAGNOSIS — Z171 Estrogen receptor negative status [ER-]: Secondary | ICD-10-CM

## 2018-11-18 LAB — POCT GLYCOSYLATED HEMOGLOBIN (HGB A1C): Hemoglobin A1C: 6.4 % — AB (ref 4.0–5.6)

## 2018-11-18 NOTE — Progress Notes (Signed)
Impression and Recommendations:    1. Malignant neoplasm of upper-outer quadrant of left breast in female, estrogen receptor negative ([place])   2. Morbid obesity ([place])   3. Glucose intolerance (impaired glucose tolerance)   4. Acquired absence of left breast   5. S/P mastectomy, bilateral   6. Acquired absence of bilateral breasts and nipples   7. Elevated LDL cholesterol level-  10-year risk less than 2% 5/19   8. Family history of diabetes mellitus in mother-   in her mid 8s   9. Vitamin D insufficiency     - Six months since last full lab draw.  - Patient declines flu vaccine today.  - Blood pressure slightly elevated on intake today.  Patient knows to keep an eye on her blood pressure at home.  Reviewed goal blood pressure with patient today.  1. Malignant neoplasm of upper-outer quadrant of left breast in female, estrogen receptor negative ([place]); acquired absence of left breast; acquired absence of bilateral breasts and nipples; S/P mastectomy, bilateral - Patient's treatment plan will continue to be managed by oncology. - Will continue to monitor.  2. Glucose Intolerance; family hx of DM in mother (mid 62's): - HbA1c is up to 6.4 today. - Educated patient that an HbA1c of 6.5 or greater constitutes a diabetes diagnosis. - Encouraged patient to purchase a glucometer and test strips to monitor her sugars. - Patient knows that she may come in for education at any time.  - Re-check in 3 months.  Will continue to monitor closely.  - Counseled patient on prevention of disease and discussed dietary and lifestyle modifications as first line.  Importance of low carb/ketogenic diet discussed with patient in addition to regular exercise.   - Discussed that exercising and eating more prudently will help to prevent onset of diabetes.  - Encouraged patient to begin a program like Weight Watchers to begin losing weight.  - Information provided today on prediabetes diet, and  diabetes prevention.  3. Elevated LDL cholesterol level-  10-year risk less than 2% 5/19 - Dietary changes such as low saturated & trans fat and low carb/ ketogenic diets discussed with patient.  Encouraged regular exercise and weight loss when appropriate.  - Will continue to monitor.  4. Vitamin D insufficiency - Continue supplementation as prescribed. - Will continue to monitor.  5. Specialty Care - Encouraged patient to keep all appointments with specialists as scheduled, especially given her recent history of chemotherapy.  6. BMI Counseling - Morbid Obesity Explained to patient what BMI refers to, and what it means medically.    Told patient to think about it as a "medical risk stratification measurement" and how increasing BMI is associated with increasing risk/ or worsening state of various diseases such as hypertension, hyperlipidemia, diabetes, premature OA, depression etc.  American Heart Association guidelines for healthy diet, basically Mediterranean diet, and exercise guidelines of 30 minutes 5 days per week or more discussed in detail.  Health counseling performed.  All questions answered.  7. Lifestyle & Preventative Health Maintenance - Advised patient to continue working toward exercising to improve overall mental, physical, and emotional health.    - Reviewed the "spokes of the wheel" of mood and health management.  Stressed the importance of ongoing prudent habits, including regular exercise, appropriate sleep hygiene, healthful dietary habits, and prayer/meditation to relax.  - Encouraged patient to engage in daily physical activity, especially a formal exercise routine.  Recommended that the patient eventually strive for at  least 150 minutes of moderate cardiovascular activity per week according to guidelines established by the [facility].   - Healthy dietary habits encouraged, including low-carb, and high amounts of lean protein in diet.   - Patient should also consume  adequate amounts of water.    Orders Placed This Encounter  Procedures  . POCT glycosylated hemoglobin (Hb A1C)     Gross side effects, risk and benefits, and alternatives of medications and treatment plan in general discussed with patient.  Patient is aware that all medications have potential side effects and we are unable to predict every side effect or drug-drug interaction that may occur.   Patient will call with any questions prior to using medication if they have concerns.    Expresses verbal understanding and consents to current therapy and treatment regimen.  No barriers to understanding were identified.  Red flag symptoms and signs discussed in detail.  Patient expressed understanding regarding what to do in case of emergency\urgent symptoms  Please see AVS handed out to patient at the end of our visit for further patient instructions/ counseling done pertaining to today's office visit.   Return in about 3 months (around 02/17/2019), or update photo please, for 2) f/up pre-DM, chol, wt loss; FBW 1 wk prior.     Note:  This note was prepared with assistance of Dragon voice recognition software. Occasional wrong-word or sound-a-like substitutions may have occurred due to the inherent limitations of voice recognition software.   This document serves as a record of services personally performed by Mellody Dance, DO. It was created on her behalf by Toni Amend, a trained medical scribe. The creation of this record is based on the scribe's personal observations and the provider's statements to them.   I have reviewed the above medical documentation for accuracy and completeness and I concur.  Mellody Dance, DO 11/20/2018 9:17 PM        ------------------------------------------------------------------------------------------------------------------------------    Subjective:     HPI: Cheryl [name] is a 42 y.o. female who presents to [place] at [facility] today for issues as discussed below.  Patient notes she never smoked.    Overall, states she has been sleeping well and her mood has been well.  Has an appointment to see Dermatology February the 6th.  Recurrent Breast Cancer of the Left Breast Since her surgeries in October, patient has been busy recuperating.  [name] tired, but back to work.  Patient has had a bilateral mastectomy for treatment.  Her neuropathy from chemo has been "about the same."  She has not needed any medicine for relief of her neuropathic pain.  At one time, she was managed on gabapentin, but did not like the way it made her feel.  She continues treatment on estrogen blocker.  Prediabetes - A1c up to 6.4 Patient has a family history of diabetes; her mother was diagnosed with DM in her mid forties.  Notes she hasn't been watching it, but plans to resume in the near future.  She has been occupied with her breast concerns and treatment.   Mom is currently just eating better and doing well with management of her diabetes diagnosis.  Denies polyuria/polydipsia.  Denies hypo/ hyperglycemia symptoms  Last A1C in the office was:  Lab Results  Component Value Date   HGBA1C 6.4 (A) 11/18/2018   HGBA1C 6.2 (H) 07/29/2018   HGBA1C 6.1 (H) 04/01/2018    Lab Results  Component Value Date   LDLCALC 177 (H)  04/01/2018   CREATININE 0.89 09/15/2018     Wt Readings from Last 3 Encounters:  11/18/18 216 lb 6.4 oz (98.2 kg)  11/04/18 218 lb (98.9 kg)  10/25/18 230 lb (104.3 kg)   BP Readings from Last 3 Encounters:  11/18/18 127/86  11/04/18 118/80  10/25/18 (!) 135/94   Pulse Readings from Last 3 Encounters:  11/18/18 83  11/04/18 95  10/25/18 85   BMI Readings from Last 3 Encounters:  11/18/18 39.58 kg/m  11/04/18 39.87 kg/m  10/25/18 40.74 kg/m     Patient Care Team    Relationship Specialty Notifications Start End  Mellody Dance, DO PCP - General Family Medicine  03/03/18     [name], MD Consulting Physician Obstetrics and Gynecology  02/13/15   [name], MD Consulting Physician General Surgery  02/13/15   Magrinat, Virgie Dad, MD Consulting Physician Oncology  02/13/15   [name], RN Registered Nurse   02/13/15   [name], RN Registered Nurse   02/13/15   Dillingham, [name], DO Attending Physician Plastic Surgery  03/21/17      Patient Active Problem List   Diagnosis Date Noted  . S/P mastectomy, bilateral 10/07/2018  . Acquired absence of left breast 09/15/2018  . S/P breast reconstruction 09/13/2018  . S/P laparoscopic assisted vaginal hysterectomy ([facility]) 09/08/2018  . Glucose intolerance (impaired glucose tolerance) 04/04/2018  . Elevated LDL cholesterol level-  10-year risk less than 2% 5/19 04/04/2018  . Hypertriglyceridemia 04/04/2018  . Morbid obesity ([place]) 04/04/2018  . Vitamin D insufficiency 04/04/2018  . Health education/counseling 04/04/2018  . Family history of diabetes mellitus in mother-   in her mid 59s 04/04/2018  . Neuropathy 03/13/2018  . History of therapeutic radiation- 30+ txmnts 03/03/2018  . Port-A-Cath in place 09/09/2017  . Focal nodular hyperplasia of liver by MRI  07/26/2017  . Acquired absence of bilateral breasts and nipples 04/27/2017  . Recurrent breast cancer, left ([place]) 03/18/2017  . Genetic testing 03/05/2016  . Chemotherapy-induced neuropathy ([place]) 07/11/2015  . Bronchitis 06/13/2015  . Flank pain 05/30/2015  . UTI (urinary tract infection) 05/16/2015  . Malignant neoplasm of upper-outer quadrant of left breast in female, estrogen receptor negative ([place]) 02/13/2015    Past Medical history, Surgical history, Family history, Social history, Allergies and Medications have been entered into the medical record, reviewed and changed as needed.    Current Meds  Medication Sig  . anastrozole (ARIMIDEX) 1 MG tablet Take 1 tablet (1 mg total) by mouth daily.  Marland Kitchen b complex vitamins tablet  Take 1 tablet by mouth daily.  . cholecalciferol (VITAMIN D) 1000 units tablet Take 1,000 Units by mouth daily.  Marland Kitchen ibuprofen (ADVIL,MOTRIN) 600 MG tablet Take 1 tablet (600 mg total) by mouth every 6 (six) hours as needed (mild pain).    Allergies:  Allergies  Allergen Reactions  . Adhesive [Tape] Other (See Comments)    Band Aids causes Skin blisters.  All other tape is okay to use     Review of Systems:  A fourteen system review of systems was performed and found to be positive as per HPI.   Objective:   Blood pressure 127/86, pulse 83, temperature 98.3 F (36.8 C), height 5\' 2"  (1.575 m), weight 216 lb 6.4 oz (98.2 kg), last menstrual period 04/02/2015, SpO2 100 %. Body mass index is 39.58 kg/m. General:  Well Developed, well nourished, appropriate for stated age.  Neuro:  Alert and oriented,  extra-ocular muscles intact  HEENT:  Normocephalic, atraumatic, neck supple, no carotid bruits appreciated  Skin:  no gross rash, warm, pink. Cardiac:  RRR, S1 S2 Respiratory:  ECTA B/L and A/P, Not using accessory muscles, speaking in full sentences- unlabored. Vascular:  Ext warm, no cyanosis apprec.; cap RF less 2 sec. Psych:  No HI/SI, judgement and insight good, Euthymic mood. Full Affect.

## 2018-11-18 NOTE — Patient Instructions (Signed)
Risk factors for prediabetes and type 2 diabetes ° °Researchers don't fully understand why some people develop prediabetes and type 2 diabetes and others don't.  It's clear that certain factors increase the risk, however, including: ° °Weight. The more fatty tissue you have, the more resistant your cells become to insulin.  °Inactivity. The less active you are, the greater your risk. Physical activity helps you control your weight, uses up glucose as energy and makes your cells more sensitive to insulin.  °Family history. Your risk increases if a parent or sibling has type 2 diabetes.  °Race. Although it's unclear why, people of certain races -- including blacks, Hispanics, American Indians and Asian-Americans -- are at higher risk.  °Age. Your risk increases as you get older. This may be because you tend to exercise less, lose muscle mass and gain weight as you age. But type 2 diabetes is also increasing dramatically among children, adolescents and younger adults.  °Gestational diabetes. If you developed gestational diabetes when you were pregnant, your risk of developing prediabetes and type 2 diabetes later increases. If you gave birth to a baby weighing more than 9 pounds (4 kilograms), you're also at risk of type 2 diabetes.  °Polycystic ovary syndrome. For women, having polycystic ovary syndrome -- a common condition characterized by irregular menstrual periods, excess hair growth and obesity -- increases the risk of diabetes.  °High blood pressure. Having blood pressure over 140/90 millimeters of mercury (mm Hg) is linked to an increased risk of type 2 diabetes.  °Abnormal cholesterol and triglyceride levels. If you have low levels of high-density lipoprotein (HDL), or "good," cholesterol, your risk of type 2 diabetes is higher. Triglycerides are another type of fat carried in the blood. People with high levels of triglycerides have an increased risk of type 2 diabetes. Your doctor can let you know what your  cholesterol and triglyceride levels are. ° °A good guide to good carbs: The glycemic index °---If you have diabetes, or at risk for diabetes, you know all too well that when you eat carbohydrates, your blood sugar goes up. The total amount of carbs you consume at a meal or in a snack mostly determines what your blood sugar will do. But the food itself also plays a role. A serving of white rice has almost the same effect as eating pure table sugar -- a quick, high spike in blood sugar. A serving of lentils has a slower, smaller effect. ° °---Picking good sources of carbs can help you control your blood sugar and your weight. Even if you don't have diabetes, eating healthier carbohydrate-rich foods can help ward off a host of chronic conditions, from heart disease to various cancers to, well, diabetes. ° °---One way to choose foods is with the glycemic index (GI). This tool measures how much a food boosts blood sugar.  The glycemic index rates the effect of a specific amount of a food on blood sugar compared with the same amount of pure glucose. A food with a glycemic index of 28 boosts blood sugar only 28% as much as pure glucose. One with a GI of 95 acts like pure glucose. ° ° ° °High glycemic foods result in a quick spike in insulin and blood sugar (also known as blood glucose).  Low glycemic foods have a slower, smaller effect- these are healthier for you.  ° °Using the glycemic index °Using the glycemic index is easy: choose foods in the low GI category instead of those in the high   GI category (see below), and go easy on those in between. °Low glycemic index (GI of 55 or less): Most fruits and vegetables, beans, minimally processed grains, pasta, low-fat dairy foods, and nuts.  °Moderate glycemic index (GI 56 to 69): White and sweet potatoes, corn, white rice, couscous, breakfast cereals such as Cream of Wheat and Mini Wheats.  °High glycemic index (GI of 70 or higher): White bread, rice cakes, most crackers,  bagels, cakes, doughnuts, croissants, most packaged breakfast cereals. °You can see the values for 100 commons foods and get links to more at www.health.[employer].edu/glycemic. ° °Swaps for lowering glycemic index  °Instead of this high-glycemic index food Eat this lower-glycemic index food  °White rice Brown rice or converted rice  °Instant oatmeal Steel-cut oats  °Cornflakes Bran flakes  °Baked potato Pasta, bulgur  °White bread Whole-grain bread  °Corn Peas or leafy greens  ° ° ° ° ° °Prediabetes Eating Plan ° °Prediabetes--also called impaired glucose tolerance or impaired fasting glucose--is a condition that causes blood sugar (blood glucose) levels to be higher than normal. Following a healthy diet can help to keep prediabetes under control. It can also help to lower the risk of type 2 diabetes and heart disease, which are increased in people who have prediabetes. Along with regular exercise, a healthy diet: °· Promotes weight loss. °· Helps to control blood sugar levels. °· Helps to improve the way that the body uses insulin. ° ° °WHAT DO I NEED TO KNOW ABOUT THIS EATING PLAN? ° °· Use the glycemic index (GI) to plan your meals. The index tells you how quickly a food will raise your blood sugar. Choose low-GI foods. These foods take a longer time to raise blood sugar. °· Pay close attention to the amount of carbohydrates in the food that you eat. Carbohydrates increase blood sugar levels. °· Keep track of how many calories you take in. Eating the right amount of calories will help you to achieve a healthy weight. Losing about 7 percent of your starting weight can help to prevent type 2 diabetes. °· You may want to follow a Mediterranean diet. This diet includes a lot of vegetables, lean meats or fish, whole grains, fruits, and healthy oils and fats. ° ° °WHAT FOODS CAN I EAT? ° °Grains °Whole grains, such as whole-wheat or whole-grain breads, crackers, cereals, and pasta. Unsweetened oatmeal. Bulgur. Barley.  Quinoa. Brown rice. Corn or whole-wheat flour tortillas or taco shells. °Vegetables °Lettuce. Spinach. Peas. Beets. Cauliflower. Cabbage. Broccoli. Carrots. Tomatoes. Squash. Eggplant. Herbs. Peppers. Onions. Cucumbers. Brussels sprouts. °Fruits °Berries. Bananas. Apples. Oranges. Grapes. Papaya. Mango. Pomegranate. Kiwi. Grapefruit. Cherries. °Meats and Other Protein Sources °Seafood. Lean meats, such as chicken and turkey or lean cuts of pork and beef. Tofu. Eggs. Nuts. Beans. °Dairy °Low-fat or fat-free dairy products, such as yogurt, cottage cheese, and cheese. °Beverages °Water. Tea. Coffee. Sugar-free or diet soda. Seltzer water. Milk. Milk alternatives, such as soy or almond milk. °Condiments °Mustard. Relish. Low-fat, low-sugar ketchup. Low-fat, low-sugar barbecue sauce. Low-fat or fat-free mayonnaise. °Sweets and Desserts °Sugar-free or low-fat pudding. Sugar-free or low-fat ice cream and other frozen treats. °Fats and Oils °Avocado. Walnuts. Olive oil. °The items listed above may not be a complete list of recommended foods or beverages. Contact your dietitian for more options.  ° ° °WHAT FOODS ARE NOT RECOMMENDED? ° °Grains °Refined white flour and flour products, such as bread, pasta, snack foods, and cereals. °Beverages °Sweetened drinks, such as sweet iced tea and soda. °Sweets and Desserts °  Baked goods, such as cake, cupcakes, pastries, cookies, and cheesecake. °The items listed above may not be a complete list of foods and beverages to avoid. Contact your dietitian for more information. °  °This information is not intended to replace advice given to you by your health care provider. Make sure you discuss any questions you have with your health care provider. °  °Document Released: 03/26/2015 Document Reviewed: 03/26/2015 °Elsevier Interactive Patient Education ©2016 [employer]vier Inc. ° ° ° °

## 2018-11-22 ENCOUNTER — Encounter: Payer: Self-pay | Admitting: Plastic Surgery

## 2018-11-22 ENCOUNTER — Ambulatory Visit (INDEPENDENT_AMBULATORY_CARE_PROVIDER_SITE_OTHER): Payer: 59 | Admitting: Plastic Surgery

## 2018-11-22 VITALS — BP 135/90 | HR 100 | Temp 98.4°F | Ht 62.0 in | Wt 217.0 lb

## 2018-11-22 DIAGNOSIS — C50412 Malignant neoplasm of upper-outer quadrant of left female breast: Secondary | ICD-10-CM

## 2018-11-22 DIAGNOSIS — C50912 Malignant neoplasm of unspecified site of left female breast: Secondary | ICD-10-CM

## 2018-11-22 DIAGNOSIS — Z171 Estrogen receptor negative status [ER-]: Secondary | ICD-10-CM

## 2018-11-22 DIAGNOSIS — Z9012 Acquired absence of left breast and nipple: Secondary | ICD-10-CM

## 2018-11-22 DIAGNOSIS — Z9013 Acquired absence of bilateral breasts and nipples: Secondary | ICD-10-CM

## 2018-11-22 NOTE — Progress Notes (Signed)
   Subjective:    Patient ID: Cheryl [name], female    DOB: 09-15-1976, 42 y.o.   MRN: [id number]  Cheryl [name] is a 42 year old female here for follow-up on her breast reconstruction.  She underwent bilateral mastectomies with expander placement.  The left breast was radiated.  She thenunderwent exchange to implants.  She did well for several months.  Then had some thinning of the skin on the left lateral breast.  There was concern for exposure.  The decision was made for the latissimus which is been discussed multiple times in the past due to the radiation.  The latissimus was done and she has been expanded and doing very well.  There is no sign of infection.  No redness.  [name] looking very good.  And is ready for exchange.  Today she is a little sad due to the loss of her uncle who died suddenly a few days ago.  The left expander has 340 cc / 375 cc.     Review of Systems  Constitutional: Negative for diaphoresis.  HENT: Negative.   Eyes: Negative.   Respiratory: Negative.   Cardiovascular: Negative.   Gastrointestinal: Negative.   Endocrine: Negative.   Genitourinary: Negative.   Musculoskeletal: Negative.        Objective:   Physical Exam Vitals signs and nursing note reviewed.  Constitutional:      Appearance: Normal appearance.  HENT:     Head: Normocephalic and atraumatic.  Cardiovascular:     Rate and Rhythm: Normal rate.  Skin:    General: Skin is warm.  Neurological:     General: No focal deficit present.     Mental Status: [name] alert.  Psychiatric:        Mood and Affect: Mood normal.        Thought Content: Thought content normal.        Judgment: Judgment normal.       Assessment & Plan:  Malignant neoplasm of upper-outer quadrant of left breast in female, estrogen receptor negative ([facility])  Recurrent breast cancer, left ([facility])  Acquired absence of bilateral breasts and nipples  Acquired absence of left breast  S/P mastectomy, bilateral Plan for  left breast exchange of expander for implant with liposuction of the right breast for symmetry.  Plan for early January early February surgery.

## 2018-11-28 ENCOUNTER — Telehealth: Payer: Self-pay | Admitting: Plastic Surgery

## 2018-11-28 NOTE — Telephone Encounter (Signed)
Patient called inquiring about surgery for exchanging expander for implant. Best dates are last week of January or first week of February. Patient requesting call back for process update.

## 2018-12-02 ENCOUNTER — Telehealth: Payer: Self-pay | Admitting: Oncology

## 2018-12-02 NOTE — Telephone Encounter (Signed)
Patient called to reschedule  °

## 2018-12-02 NOTE — Telephone Encounter (Signed)
Patient has been scheduled for Surgery

## 2018-12-05 ENCOUNTER — Telehealth: Payer: Self-pay | Admitting: Family Medicine

## 2018-12-05 NOTE — Telephone Encounter (Signed)
Patient called and left VM during lunch stating that at last OV Dr. Jenetta Downer discussed the ability to prescribe weight loss meds, patient has been trying on her own but has decided she could use the med help. Please advise if this is something we are still able to do for the patient.

## 2018-12-06 ENCOUNTER — Encounter: Payer: Self-pay | Admitting: Plastic Surgery

## 2018-12-06 ENCOUNTER — Ambulatory Visit (INDEPENDENT_AMBULATORY_CARE_PROVIDER_SITE_OTHER): Payer: 59 | Admitting: Plastic Surgery

## 2018-12-06 VITALS — BP 147/91 | HR 99 | Temp 99.0°F | Ht 62.0 in | Wt 217.0 lb

## 2018-12-06 DIAGNOSIS — C50412 Malignant neoplasm of upper-outer quadrant of left female breast: Secondary | ICD-10-CM

## 2018-12-06 DIAGNOSIS — Z9012 Acquired absence of left breast and nipple: Secondary | ICD-10-CM

## 2018-12-06 DIAGNOSIS — Z9882 Breast implant status: Secondary | ICD-10-CM

## 2018-12-06 DIAGNOSIS — Z171 Estrogen receptor negative status [ER-]: Secondary | ICD-10-CM

## 2018-12-06 DIAGNOSIS — Z9889 Other specified postprocedural states: Secondary | ICD-10-CM

## 2018-12-06 DIAGNOSIS — Z9013 Acquired absence of bilateral breasts and nipples: Secondary | ICD-10-CM

## 2018-12-06 NOTE — Progress Notes (Signed)
   Subjective:    Patient ID: Cheryl [name], female    DOB: 1976/03/10, 43 y.o.   MRN: [id number]  The patient is a 43 year old female here for follow-up on her left breast reconstruction.  She had a little bit of what seemed like a blister on the medial aspect of her left breast.  There is no redness and no draining.  It is soft.  She has not had any fevers.  It is slightly bigger than it was a few weeks ago.   Review of Systems  Constitutional: Negative.   HENT: Negative.   Eyes: Negative.   Respiratory: Negative for apnea and shortness of breath.   Cardiovascular: Negative.   Gastrointestinal: Negative.   Genitourinary: Negative.   Musculoskeletal: Negative.   Skin: Negative for color change and wound.       Objective:   Physical Exam Vitals signs and nursing note reviewed.  Constitutional:      Appearance: Normal appearance.  Cardiovascular:     Rate and Rhythm: Normal rate and regular rhythm.  Pulmonary:     Effort: Pulmonary effort is normal.  Chest:    Neurological:     General: No focal deficit present.     Mental Status: [name] alert.  Psychiatric:        Mood and Affect: Mood normal.        Thought Content: Thought content normal.        Judgment: Judgment normal.       Assessment & Plan:  Malignant neoplasm of upper-outer quadrant of left breast in female, estrogen receptor negative ([place])  Acquired absence of bilateral breasts and nipples  S/P mastectomy, bilateral  Acquired absence of left breast  S/P breast reconstruction  We discussed the options of drainage, antibiotics and pressure>  At this point feel it is best to wait.  If it gets red or drains in any way the patient is aware to give [place] a call immediately.  This could affect our surgical procedure timing.  We will plan for surgery in Feb.  She will be here next week for her H and P.

## 2018-12-06 NOTE — Telephone Encounter (Signed)
Called and advised patient per Dr. Raliegh Scarlet she would need an OV.  Appointment made for 12/21/2018 at 415. MPulliam, CMA/RT(R)

## 2018-12-13 ENCOUNTER — Encounter: Payer: Self-pay | Admitting: Plastic Surgery

## 2018-12-13 ENCOUNTER — Ambulatory Visit (INDEPENDENT_AMBULATORY_CARE_PROVIDER_SITE_OTHER): Payer: 59 | Admitting: Plastic Surgery

## 2018-12-13 VITALS — BP 136/90 | HR 98 | Temp 98.4°F | Ht 62.0 in | Wt 217.0 lb

## 2018-12-13 DIAGNOSIS — Z9013 Acquired absence of bilateral breasts and nipples: Secondary | ICD-10-CM

## 2018-12-13 DIAGNOSIS — Z9889 Other specified postprocedural states: Secondary | ICD-10-CM

## 2018-12-13 DIAGNOSIS — Z9882 Breast implant status: Secondary | ICD-10-CM

## 2018-12-13 DIAGNOSIS — Z9012 Acquired absence of left breast and nipple: Secondary | ICD-10-CM

## 2018-12-13 MED ORDER — CEPHALEXIN 500 MG PO CAPS: 500.0000 mg | ORAL_CAPSULE | Freq: Four times a day (QID) | ORAL | 0 refills | Status: AC

## 2018-12-13 MED ORDER — HYDROCODONE-ACETAMINOPHEN 5-325 MG PO TABS: 1.0000 | ORAL_TABLET | Freq: Four times a day (QID) | ORAL | 0 refills | Status: AC | PRN

## 2018-12-13 MED ORDER — DIAZEPAM 2 MG PO TABS: 2.0000 mg | ORAL_TABLET | Freq: Two times a day (BID) | ORAL | 0 refills | Status: DC | PRN

## 2018-12-13 MED ORDER — ONDANSETRON HCL 4 MG PO TABS: 4.0000 mg | ORAL_TABLET | Freq: Three times a day (TID) | ORAL | 0 refills | Status: AC | PRN

## 2018-12-13 NOTE — H&P (View-Only) (Signed)
Patient ID: Cheryl [name], female    DOB: 1976-03-08, 43 y.o.   MRN: [id number]   Chief Complaint  Patient presents with  . Pre-op Exam    for implant    The patient is a 43 year old white female here with her mom for her history and physical for left breast reconstruction.  She underwent bilateral mastectomies after a complicated course.  She required left sided radiation.  She had expanders that then went to implants.  The left side had breakdown as had been explained as a possibility preoperatively due to the radiation.  The decision was made to salvage the reconstruction with a latissimus muscle flap and placement of expander.  On the Right has a Mentor smooth round ultra high profile gel 590 cc implant.  On the left she currently has 340 cc.  She has a little bit of a blister type lesion on the medial aspect of the left breast that is about 2 cm in size.  It seems stable from her last visit.  There is no sign of redness or infection.  She has good symmetry but the left side is a little bit tight.  She has not had any recent illnesses and is otherwise doing well.   Review of Systems  Past Medical History:  Diagnosis Date  . Breast cancer ([place]) 2016 and 2018   left breast, surgery and chemo done 2018  . Cellulitis 07/25/2018   left breast  . Pre-diabetes   . SVD (spontaneous vaginal delivery)    x 3  . Wears contact lenses     Past Surgical History:  Procedure Laterality Date  . ABDOMINAL HYSTERECTOMY    . BILATERAL TOTAL MASTECTOMY WITH AXILLARY LYMPH NODE DISSECTION  04/20/2017  . BREAST BIOPSY Left 02/18/2015  . BREAST BIOPSY Right 02/18/2015  . BREAST EXCISIONAL BIOPSY Left 04/05/2015  . BREAST IMPLANT REMOVAL Left 09/15/2018   Procedure: REMOVAL BREAST IMPLANTS;  Surgeon: Wallace Going, DO;  Location: [place];  Service: Plastics;  Laterality: Left;  . BREAST LUMPECTOMY Left 03/27/2015  . BREAST LUMPECTOMY WITH NEEDLE LOCALIZATION AND AXILLARY SENTINEL LYMPH  NODE BX Left 03/27/2015   Procedure: LEFT BREAST LUMPECTOMY WITH BRACKETED NEEDLE LOCALIZATION AND LEFT  AXILLARY SENTINEL LYMPH NODE BX;  Surgeon: [name], MD;  Location: [place];  Service: General;  Laterality: Left;  . BREAST RECONSTRUCTION WITH PLACEMENT OF TISSUE EXPANDER AND FLEX HD (ACELLULAR HYDRATED DERMIS) Bilateral 04/20/2017   Procedure: BILATARAL BREAST RECONSTRUCTION WITH PLACEMENT OF TISSUE EXPANDER AND FLEX HD (ACELLULAR HYDRATED DERMIS);  Surgeon: Wallace Going, DO;  Location: [place];  Service: Plastics;  Laterality: Bilateral;  . BREAST SURGERY Bilateral    masectomy  . CHOLECYSTECTOMY N/A 07/07/2017   Procedure: LAPAROSCOPIC CHOLECYSTECTOMY WITH INTRAOPERATIVE CHOLANGIOGRAM;  Surgeon: [name], MD;  Location: [facility];  Service: General;  Laterality: N/A;  . Insertion of Essure  yrs ago   For Birth control  . LAPAROSCOPIC VAGINAL HYSTERECTOMY WITH SALPINGO OOPHORECTOMY Bilateral 09/08/2018   Procedure: LAPAROSCOPIC ASSISTED VAGINAL HYSTERECTOMY WITH SALPINGO OOPHORECTOMY;  Surgeon: [name], MD;  Location: [place];  Service: Gynecology;  Laterality: Bilateral;  . LATISSIMUS FLAP TO BREAST Left 09/15/2018   Procedure: LATISSIMUS FLAP TO LEFT BREAST;  Surgeon: Wallace Going, DO;  Location: [place];  Service: Plastics;  Laterality: Left;  . LIPOSUCTION WITH LIPOFILLING Bilateral 05/20/2018   Procedure: Bilateral breast fat grafting from abdomen to improve symmetry following breast reconstruction;  Surgeon: Marla Roe,  [name], DO;  Location: [place];  Service: Plastics;  Laterality: Bilateral;  . MASTECTOMY W/ SENTINEL NODE BIOPSY Bilateral 04/20/2017   Procedure: BILATERAL TOTAL MASTECTOMY WITH LEFT AXILLARY SENTINEL LYMPH NODE BIOPSY;  Surgeon: [name], MD;  Location: [place];  Service: General;  Laterality: Bilateral;  . PORTA CATH REMOVAL Right 11/04/2017   Procedure: PORTA CATH  REMOVAL;  Surgeon: Wallace Going, DO;  Location: [place];  Service: Plastics;  Laterality: Right;  . PORTACATH PLACEMENT Right 04/25/2015   Procedure: INSERTION PORT-A-CATH;  Surgeon: [name], MD;  Location: [place];  Service: General;  Laterality: Right;, renoved in 2016  . PORTACATH PLACEMENT Right 04/20/2017   Procedure: INSERTION PORT-A-CATH;  Surgeon: [name], MD;  Location: [place];  Service: General;  Laterality: Right;  . RE-EXCISION OF BREAST LUMPECTOMY Left 04/05/2015   Procedure: RE-EXCISION LEFT  BREAST LUMPECTOMY;  Surgeon: [name], MD;  Location: [place];  Service: General;  Laterality: Left;  . REMOVAL OF TISSUE EXPANDER AND PLACEMENT OF IMPLANT Bilateral 11/04/2017   Procedure: BILATERAL REMOVAL OF TISSUE EXPANDER AND PLACEMENT OF  BILATERAL BREAST IMPLANT;  Surgeon: Wallace Going, DO;  Location: [place];  Service: Plastics;  Laterality: Bilateral;  . TISSUE EXPANDER PLACEMENT Left 09/15/2018   Procedure: PLACEMENT OF TISSUE EXPANDER;  Surgeon: Wallace Going, DO;  Location: [place];  Service: Plastics;  Laterality: Left;      Current Outpatient Medications:  .  b complex vitamins tablet, Take 1 tablet by mouth daily., Disp: , Rfl:  .  cholecalciferol (VITAMIN D) 1000 units tablet, Take 1,000 Units by mouth daily., Disp: , Rfl:  .  ibuprofen (ADVIL,MOTRIN) 600 MG tablet, Take 1 tablet (600 mg total) by mouth every 6 (six) hours as needed (mild pain)., Disp: 30 tablet, Rfl: 0 .  anastrozole (ARIMIDEX) 1 MG tablet, Take 1 tablet (1 mg total) by mouth daily., Disp: 90 tablet, Rfl: 4   Objective:   Vitals:   12/13/18 1506  BP: 136/90  Pulse: 98  Temp: 98.4 F (36.9 C)  SpO2: 98%    Physical Exam Vitals signs and nursing note reviewed.  Constitutional:      Appearance: Normal appearance.  HENT:     Head: Normocephalic and atraumatic.     Nose: Nose normal.      Mouth/Throat:     Mouth: Mucous membranes are moist.  Cardiovascular:     Rate and Rhythm: Normal rate.  Pulmonary:     Effort: Pulmonary effort is normal.  Abdominal:     General: Abdomen is flat.  Skin:    General: Skin is warm.  Neurological:     General: No focal deficit present.     Mental Status: [name] alert.  Psychiatric:        Mood and Affect: Mood normal.        Thought Content: Thought content normal.        Judgment: Judgment normal.     Assessment & Plan:  S/P mastectomy, bilateral  Acquired absence of left breast  S/P breast reconstruction  Due to the tightness on the left side 50 cc was placed for a total of 390 cc in the 375 cc expander.  Plan for removal of left breast expander and placement of an implant. The risks that can be encountered with and after placement of a breast implant placement were discussed and include the following but not limited to these:  bleeding, infection, delayed healing, anesthesia risks, skin sensation changes, injury to structures including nerves, blood vessels, and muscles which may be temporary or permanent, allergies to tape, suture materials and glues, blood products, topical preparations or injected agents, skin contour irregularities, skin discoloration and swelling, deep vein thrombosis, cardiac and pulmonary complications, pain, which may persist, fluid accumulation, wrinkling of the skin over the expander, changes in nipple or breast sensation, expander leakage or rupture, faulty position of the expander, persistent pain, formation of tight scar tissue around the expander (capsular contracture), possible need for revisional surgery or staged procedures.  Prescription sent into pharmacy.   [place], DO

## 2018-12-13 NOTE — Progress Notes (Addendum)
Patient ID: Cheryl [name], female    DOB: 02/07/1976, 43 y.o.   MRN: [id number]   Chief Complaint  Patient presents with  . Pre-op Exam    for implant    The patient is a 43 year old white female here with her mom for her history and physical for left breast reconstruction.  She underwent bilateral mastectomies after a complicated course.  She required left sided radiation.  She had expanders that then went to implants.  The left side had breakdown as had been explained as a possibility preoperatively due to the radiation.  The decision was made to salvage the reconstruction with a latissimus muscle flap and placement of expander.  On the Right has a Mentor smooth round ultra high profile gel 590 cc implant.  On the left she currently has 340 cc.  She has a little bit of a blister type lesion on the medial aspect of the left breast that is about 2 cm in size.  It seems stable from her last visit.  There is no sign of redness or infection.  She has good symmetry but the left side is a little bit tight.  She has not had any recent illnesses and is otherwise doing well.   Review of Systems  Past Medical History:  Diagnosis Date  . Breast cancer ([place]) 2016 and 2018   left breast, surgery and chemo done 2018  . Cellulitis 07/25/2018   left breast  . Pre-diabetes   . SVD (spontaneous vaginal delivery)    x 3  . Wears contact lenses     Past Surgical History:  Procedure Laterality Date  . ABDOMINAL HYSTERECTOMY    . BILATERAL TOTAL MASTECTOMY WITH AXILLARY LYMPH NODE DISSECTION  04/20/2017  . BREAST BIOPSY Left 02/18/2015  . BREAST BIOPSY Right 02/18/2015  . BREAST EXCISIONAL BIOPSY Left 04/05/2015  . BREAST IMPLANT REMOVAL Left 09/15/2018   Procedure: REMOVAL BREAST IMPLANTS;  Surgeon: Wallace Going, DO;  Location: [place];  Service: Plastics;  Laterality: Left;  . BREAST LUMPECTOMY Left 03/27/2015  . BREAST LUMPECTOMY WITH NEEDLE LOCALIZATION AND AXILLARY SENTINEL LYMPH  NODE BX Left 03/27/2015   Procedure: LEFT BREAST LUMPECTOMY WITH BRACKETED NEEDLE LOCALIZATION AND LEFT  AXILLARY SENTINEL LYMPH NODE BX;  Surgeon: [name], MD;  Location: [place];  Service: General;  Laterality: Left;  . BREAST RECONSTRUCTION WITH PLACEMENT OF TISSUE EXPANDER AND FLEX HD (ACELLULAR HYDRATED DERMIS) Bilateral 04/20/2017   Procedure: BILATARAL BREAST RECONSTRUCTION WITH PLACEMENT OF TISSUE EXPANDER AND FLEX HD (ACELLULAR HYDRATED DERMIS);  Surgeon: Wallace Going, DO;  Location: [place];  Service: Plastics;  Laterality: Bilateral;  . BREAST SURGERY Bilateral    masectomy  . CHOLECYSTECTOMY N/A 07/07/2017   Procedure: LAPAROSCOPIC CHOLECYSTECTOMY WITH INTRAOPERATIVE CHOLANGIOGRAM;  Surgeon: [name], MD;  [facility];  Service: General;  Laterality: N/A;  . Insertion of Essure  yrs ago   For Birth control  . LAPAROSCOPIC VAGINAL HYSTERECTOMY WITH SALPINGO OOPHORECTOMY Bilateral 09/08/2018   Procedure: LAPAROSCOPIC ASSISTED VAGINAL HYSTERECTOMY WITH SALPINGO OOPHORECTOMY;  Surgeon: [name], MD;  Location: [place];  Service: Gynecology;  Laterality: Bilateral;  . LATISSIMUS FLAP TO BREAST Left 09/15/2018   Procedure: LATISSIMUS FLAP TO LEFT BREAST;  Surgeon: Wallace Going, DO;  Location: [place];  Service: Plastics;  Laterality: Left;  . LIPOSUCTION WITH LIPOFILLING Bilateral 05/20/2018   Procedure: Bilateral breast fat grafting from abdomen to improve symmetry following breast reconstruction;  Surgeon: Marla Roe,  [name], DO;  Location: [place];  Service: Plastics;  Laterality: Bilateral;  . MASTECTOMY W/ SENTINEL NODE BIOPSY Bilateral 04/20/2017   Procedure: BILATERAL TOTAL MASTECTOMY WITH LEFT AXILLARY SENTINEL LYMPH NODE BIOPSY;  Surgeon: [name], MD;  Location: [place];  Service: General;  Laterality: Bilateral;  . PORTA CATH REMOVAL Right 11/04/2017   Procedure: PORTA CATH  REMOVAL;  Surgeon: Wallace Going, DO;  Location: [place];  Service: Plastics;  Laterality: Right;  . PORTACATH PLACEMENT Right 04/25/2015   Procedure: INSERTION PORT-A-CATH;  Surgeon: [name], MD;  Location: [place];  Service: General;  Laterality: Right;, renoved in 2016  . PORTACATH PLACEMENT Right 04/20/2017   Procedure: INSERTION PORT-A-CATH;  Surgeon: [name], MD;  Location: [place];  Service: General;  Laterality: Right;  . RE-EXCISION OF BREAST LUMPECTOMY Left 04/05/2015   Procedure: RE-EXCISION LEFT  BREAST LUMPECTOMY;  Surgeon: [name], MD;  Location: [place];  Service: General;  Laterality: Left;  . REMOVAL OF TISSUE EXPANDER AND PLACEMENT OF IMPLANT Bilateral 11/04/2017   Procedure: BILATERAL REMOVAL OF TISSUE EXPANDER AND PLACEMENT OF  BILATERAL BREAST IMPLANT;  Surgeon: Wallace Going, DO;  Location: [place];  Service: Plastics;  Laterality: Bilateral;  . TISSUE EXPANDER PLACEMENT Left 09/15/2018   Procedure: PLACEMENT OF TISSUE EXPANDER;  Surgeon: Wallace Going, DO;  Location: [place];  Service: Plastics;  Laterality: Left;      Current Outpatient Medications:  .  b complex vitamins tablet, Take 1 tablet by mouth daily., Disp: , Rfl:  .  cholecalciferol (VITAMIN D) 1000 units tablet, Take 1,000 Units by mouth daily., Disp: , Rfl:  .  ibuprofen (ADVIL,MOTRIN) 600 MG tablet, Take 1 tablet (600 mg total) by mouth every 6 (six) hours as needed (mild pain)., Disp: 30 tablet, Rfl: 0 .  anastrozole (ARIMIDEX) 1 MG tablet, Take 1 tablet (1 mg total) by mouth daily., Disp: 90 tablet, Rfl: 4   Objective:   Vitals:   12/13/18 1506  BP: 136/90  Pulse: 98  Temp: 98.4 F (36.9 C)  SpO2: 98%    Physical Exam Vitals signs and nursing note reviewed.  Constitutional:      Appearance: Normal appearance.  HENT:     Head: Normocephalic and atraumatic.     Nose: Nose normal.      Mouth/Throat:     Mouth: Mucous membranes are moist.  Cardiovascular:     Rate and Rhythm: Normal rate.  Pulmonary:     Effort: Pulmonary effort is normal.  Abdominal:     General: Abdomen is flat.  Skin:    General: Skin is warm.  Neurological:     General: No focal deficit present.     Mental Status: [name] alert.  Psychiatric:        Mood and Affect: Mood normal.        Thought Content: Thought content normal.        Judgment: Judgment normal.     Assessment & Plan:  S/P mastectomy, bilateral  Acquired absence of left breast  S/P breast reconstruction  Due to the tightness on the left side 50 cc was placed for a total of 390 cc in the 375 cc expander.  Plan for removal of left breast expander and placement of an implant. The risks that can be encountered with and after placement of a breast implant placement were discussed and include the following but not limited to these:  bleeding, infection, delayed healing, anesthesia risks, skin sensation changes, injury to structures including nerves, blood vessels, and muscles which may be temporary or permanent, allergies to tape, suture materials and glues, blood products, topical preparations or injected agents, skin contour irregularities, skin discoloration and swelling, deep vein thrombosis, cardiac and pulmonary complications, pain, which may persist, fluid accumulation, wrinkling of the skin over the expander, changes in nipple or breast sensation, expander leakage or rupture, faulty position of the expander, persistent pain, formation of tight scar tissue around the expander (capsular contracture), possible need for revisional surgery or staged procedures.  Prescription sent into pharmacy.   [place], DO

## 2018-12-16 DIAGNOSIS — Z853 Personal history of malignant neoplasm of breast: Secondary | ICD-10-CM | POA: Insufficient documentation

## 2018-12-16 DIAGNOSIS — R05 Cough: Secondary | ICD-10-CM | POA: Diagnosis not present

## 2018-12-16 DIAGNOSIS — R509 Fever, unspecified: Secondary | ICD-10-CM | POA: Diagnosis not present

## 2018-12-16 DIAGNOSIS — R0981 Nasal congestion: Secondary | ICD-10-CM | POA: Diagnosis not present

## 2018-12-21 ENCOUNTER — Encounter: Payer: Self-pay | Admitting: Family Medicine

## 2018-12-21 ENCOUNTER — Other Ambulatory Visit: Payer: Self-pay

## 2018-12-21 ENCOUNTER — Ambulatory Visit: Payer: 59 | Admitting: Family Medicine

## 2018-12-21 ENCOUNTER — Encounter (HOSPITAL_BASED_OUTPATIENT_CLINIC_OR_DEPARTMENT_OTHER): Payer: Self-pay | Admitting: *Deleted

## 2018-12-21 VITALS — BP 131/89 | HR 100 | Temp 98.5°F | Ht 62.0 in | Wt 224.0 lb

## 2018-12-21 DIAGNOSIS — R030 Elevated blood-pressure reading, without diagnosis of hypertension: Secondary | ICD-10-CM

## 2018-12-21 DIAGNOSIS — Z171 Estrogen receptor negative status [ER-]: Principal | ICD-10-CM

## 2018-12-21 DIAGNOSIS — C50412 Malignant neoplasm of upper-outer quadrant of left female breast: Secondary | ICD-10-CM

## 2018-12-21 NOTE — Progress Notes (Signed)
[place]  [phone] [phone]     ID: [name] DOB: Apr 03, 1976  MR#: [id number]  RWE#:[id number]  Patient Care Team: Mellody Dance, DO as PCP - General (Family Medicine) [name], MD as Consulting Physician (Obstetrics and Gynecology) [name], MD as Consulting Physician (General Surgery) Magrinat, Virgie Dad, MD as Consulting Physician (Oncology) [name], RN as Registered Nurse [name], RN as Registered Nurse Dillingham, [name], DO as Attending Physician (Plastic Surgery) OTHER MD:  CHIEF COMPLAINT: bilateral breast cancers, one estrogen receptor positive  CURRENT TREATMENT: Anastrozole   INTERVAL HISTORY:  Lear returns today for a follow-up and treatment of her recurrent triple negative breast cancer.   The patient continues on anastrozole, which she tolerates well. She reports hot flashes, but notes they are less than when she was on tamoxifen. She also reports vaginal dryness.   Of note since her last visit, Yamina experienced infection issues with her left breast implant following a fat grafting to improve symmetry procedure on 05/20/2018. She had expander extrusion then underwent left latissimus flap procedure on 09/15/2018 and is scheduled to undergo saline left breast implant placement under Dr. Marla Roe on 12/28/2018.   REVIEW OF SYSTEMS: Brogan is doing well overall. She reports pain in her legs, describes it as a bone pain that is achy. Since her last visit, she underwent total hysterectomy with BSO on 09/08/2018 due to persistent abdominal pain. Pathology [id number]) showed no malignancy. The patient denies unusual headaches, visual changes, nausea, vomiting, stiff neck, dizziness, or gait imbalance. There has been no cough, phlegm production, or pleurisy, no chest pain or pressure, and no change in bowel or bladder habits. The patient denies fever, rash, bleeding, unexplained  fatigue or unexplained weight loss. A detailed review of systems was otherwise entirely negative.   HISTORY OF RECURRENT DISEASE: From my 03/18/2017 note:  [place] that he'll returns today for follow-up of her triple negative breast cancer, which is now recurrent. She had routine bilateral diagnostic mammography at the [facility] 03/10/2017 and this showed the breast density to be category C. The old lumpectomy site, which was in the upper outer quadrant, appears stable. However there was a new area of asymmetry in the lower inner quadrant of the left breast. Ultrasound confirmed this to be a 1.0 cm mass at the 7:00 radiant 5 cm from the nipple. Aspiration was attempted but no fluid was aspirated and biopsy of this mass was obtained 03/05/2017. This showed ([id number]) invasive mammary carcinoma, grade 2, estrogen and progesterone receptor negative, HER-2 not amplified with a signals ratio 1.17 and a number per cell of 2.45, with an MIB-1 of 15%.  [name] here today to discuss subsequent management   BREAST CANCER HISTORY: From the original intake note:  The patient had screening mammography 02/01/2015 which showed a calcifications in the upper outer quadrant of the left breast. On 02/08/2015 she underwent left diagnostic mammography with tomosynthesis and left breast ultrasonography at the breast Center. The breast density was category C. Mammography confirmed a group of pleomorphic calcifications spanning 4.2 cm maximally. Physical examination of the upper outer quadrant of the left breast showed an area of thickening at the approximate 2:00 position. Ultrasound of this area showed no discrete masses. There was vague shadowing present. Calcifications could not be clearly identified sonographically. There was no lymphadenopathy in the left axilla.  On the same day the patient underwent biopsy of the left breast area in question, with the pathology (  S AAA [id number]) showing ductal carcinoma in  situ, grade 2 or 3, with possible areas of microinvasion, the cancer cells being strongly estrogen and progesterone receptor positive, both at 100%.  On 02/15/2015 the patient underwent bilateral breast MRI. This showed the area of malignancy in the upper outer quadrant to measure 4.5 cm, including a large area of non-masslike enhancement and a 1 cm spiculated mass. There was also an indeterminate oval mass in the central left breast and another indeterminant mass in the slightly outer right breast anteriorly. Biopsy of the right breast mass 02/18/2015 ([id number]) showed a fibroadenoma, with no evidence of malignancy. Biopsy of 2 additional areas of the left breast (at 10:00 and 2:00) showed a fibroadenoma in the upper inner quadrant, and pseudo-angiomatous stromal hyperplasia (PA SH) at the 2:00 area of the left breast.  The patient's subsequent history is as detailed below.   PAST MEDICAL HISTORY: Past Medical History:  Diagnosis Date  . Breast cancer ([place]) 2016 and 2018   left breast, surgery and chemo done 2018  . Cellulitis 07/25/2018   left breast  . Pre-diabetes   . SVD (spontaneous vaginal delivery)    x 3  . Wears contact lenses     PAST SURGICAL HISTORY: Past Surgical History:  Procedure Laterality Date  . ABDOMINAL HYSTERECTOMY    . BILATERAL TOTAL MASTECTOMY WITH AXILLARY LYMPH NODE DISSECTION  04/20/2017  . BREAST BIOPSY Left 02/18/2015  . BREAST BIOPSY Right 02/18/2015  . BREAST EXCISIONAL BIOPSY Left 04/05/2015  . BREAST IMPLANT REMOVAL Left 09/15/2018   Procedure: REMOVAL BREAST IMPLANTS;  Surgeon: Wallace Going, DO;  Location: [place];  Service: Plastics;  Laterality: Left;  . BREAST LUMPECTOMY Left 03/27/2015  . BREAST LUMPECTOMY WITH NEEDLE LOCALIZATION AND AXILLARY SENTINEL LYMPH NODE BX Left 03/27/2015   Procedure: LEFT BREAST LUMPECTOMY WITH BRACKETED NEEDLE LOCALIZATION AND LEFT  AXILLARY SENTINEL LYMPH NODE BX;  Surgeon: [name], MD;  Location:  [place];  Service: General;  Laterality: Left;  . BREAST RECONSTRUCTION WITH PLACEMENT OF TISSUE EXPANDER AND FLEX HD (ACELLULAR HYDRATED DERMIS) Bilateral 04/20/2017   Procedure: BILATARAL BREAST RECONSTRUCTION WITH PLACEMENT OF TISSUE EXPANDER AND FLEX HD (ACELLULAR HYDRATED DERMIS);  Surgeon: Wallace Going, DO;  Location: [place];  Service: Plastics;  Laterality: Bilateral;  . BREAST SURGERY Bilateral    masectomy  . CHOLECYSTECTOMY N/A 07/07/2017   Procedure: LAPAROSCOPIC CHOLECYSTECTOMY WITH INTRAOPERATIVE CHOLANGIOGRAM;  Surgeon: [name], MD;  Location: [facility];  Service: General;  Laterality: N/A;  . Insertion of Ess[place]  yrs ago   For Birth control  . LAPAROSCOPIC VAGINAL HYSTERECTOMY WITH SALPINGO OOPHORECTOMY Bilateral 09/08/2018   Procedure: LAPAROSCOPIC ASSISTED VAGINAL HYSTERECTOMY WITH SALPINGO OOPHORECTOMY;  Surgeon: [name], MD;  Location: [place];  Service: Gynecology;  Laterality: Bilateral;  . LATISSIMUS FLAP TO BREAST Left 09/15/2018   Procedure: LATISSIMUS FLAP TO LEFT BREAST;  Surgeon: Wallace Going, DO;  Location: [place];  Service: Plastics;  Laterality: Left;  . LIPOSUCTION WITH LIPOFILLING Bilateral 05/20/2018   Procedure: Bilateral breast fat grafting from abdomen to improve symmetry following breast reconstruction;  Surgeon: Wallace Going, DO;  Location: [place];  Service: Plastics;  Laterality: Bilateral;  . MASTECTOMY W/ SENTINEL NODE BIOPSY Bilateral 04/20/2017   Procedure: BILATERAL TOTAL MASTECTOMY WITH LEFT AXILLARY SENTINEL LYMPH NODE BIOPSY;  Surgeon: [name], MD;  Location: [place];  Service: General;  Laterality: Bilateral;  . PORTA CATH REMOVAL Right 11/04/2017  Procedure: PORTA CATH REMOVAL;  Surgeon: Wallace Going, DO;  Location: [place];  Service: Plastics;  Laterality: Right;  . PORTACATH PLACEMENT Right 04/25/2015   Procedure:  INSERTION PORT-A-CATH;  Surgeon: [name], MD;  Location: [place];  Service: General;  Laterality: Right;, renoved in 2016  . PORTACATH PLACEMENT Right 04/20/2017   Procedure: INSERTION PORT-A-CATH;  Surgeon: [name], MD;  Location: [place];  Service: General;  Laterality: Right;  . RE-EXCISION OF BREAST LUMPECTOMY Left 04/05/2015   Procedure: RE-EXCISION LEFT  BREAST LUMPECTOMY;  Surgeon: [name], MD;  Location: [place];  Service: General;  Laterality: Left;  . REMOVAL OF TISSUE EXPANDER AND PLACEMENT OF IMPLANT Bilateral 11/04/2017   Procedure: BILATERAL REMOVAL OF TISSUE EXPANDER AND PLACEMENT OF  BILATERAL BREAST IMPLANT;  Surgeon: Wallace Going, DO;  Location: [place];  Service: Plastics;  Laterality: Bilateral;  . TISSUE EXPANDER PLACEMENT Left 09/15/2018   Procedure: PLACEMENT OF TISSUE EXPANDER;  Surgeon: Wallace Going, DO;  Location: [place];  Service: Plastics;  Laterality: Left;    FAMILY HISTORY Family History  Problem Relation Age of Onset  . Lung cancer Maternal Grandmother 47       non smoker  . Lung cancer Maternal Grandfather 4       non smoker worked in [employer]  . Cancer Paternal Grandfather 35       throat cancer ? smoker  . Cancer Cousin 41       throat cancer ? smoker   the patient's parents are living, in their mid-50s as of March 2016. The patient had no siblings. Both the patient's mother's parents died from lung cancer although they did not smoke. [place] and her maternal grandfather was a Ecologist. There is no history of breast or ovarian cancer in the family to her knowledge  GYNECOLOGIC HISTORY:  Patient's last menstrual period was 04/02/2015 (approximate). Menarche age 88, first live birth age 75. The patient is GX P3. [name] status post hysterectomy with bilateral salpingo-oophorectomy 09/08/2018.  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 Her first husband [name] is a Freight forwarder.  They divorced in 2016.  Their daughter [name] lives in [place] where she works in Press photographer. Daughter Summer is a Research scientist (physical sciences).  Daughter Journalist, newspaper lives at home and is 44 years old.  Also at home is the patient's significant other, Tommye Standard, who does have children from a prior marriage but they are all grown. She also has a 10 year old grandson "who has a 15 month old sister."    ADVANCED DIRECTIVES: Not in place   HEALTH MAINTENANCE: Social History   Tobacco Use  . Smoking status: Never Smoker  . Smokeless tobacco: Never Used  Substance Use Topics  . Alcohol use: Yes    Alcohol/week: 0.0 standard drinks    Comment: socially twice month  . Drug use: No     Colonoscopy:  PAP:  Bone density: 03/21/2018, T score of -1.4  Lipid panel:  Allergies  Allergen Reactions  . Adhesive [Tape] Other (See Comments)    Band Aids causes Skin blisters.  All other tape is okay to use    Current Outpatient Medications  Medication Sig Dispense Refill  . anastrozole (ARIMIDEX) 1 MG tablet Take 1 tablet (1 mg total) by mouth daily. 90 tablet 4  . b complex vitamins tablet  Take 1 tablet by mouth daily.    . cholecalciferol (VITAMIN D) 1000 units tablet Take 1,000 Units by mouth daily.    Marland Kitchen ibuprofen (ADVIL,MOTRIN) 600 MG tablet Take 1 tablet (600 mg total) by mouth every 6 (six) hours as needed (mild pain). 30 tablet 0   No current facility-administered medications for this visit.     OBJECTIVE: Morbidly obese white woman who appears stated age  40:   12/22/18 0850  BP: 136/87  Pulse: 97  Resp: 20  Temp: 98.8 F (37.1 C)  SpO2: 98%     Body mass index is 39.95 kg/m.    ECOG FS:1 - Symptomatic but completely ambulatory   Sclerae unicteric, EOMs intact Oropharynx clear and moist No cervical or supraclavicular adenopathy Lungs no rales or rhonchi Heart regular rate and  rhythm Abd soft, nontender, positive bowel sounds MSK no focal spinal tenderness, no upper extremity lymphedema Neuro: nonfocal, well oriented, appropriate affect Breasts: Status post bilateral mastectomies with bilateral reconstruction.  The implant on the right is unremarkable.  The implant on the left, which includes a latissimus flap, is imaged below.  Both axillae are benign.   Left breast 12/22/2018     LAB RESULTS:  CMP     Component Value Date/Time   NA 143 09/01/2018 0934   NA 138 04/26/2018 1221   NA 140 10/21/2017 1206   K 4.1 09/01/2018 0934   K 4.0 10/21/2017 1206   CL 106 09/01/2018 0934   CO2 26 09/01/2018 0934   CO2 24 10/21/2017 1206   GLUCOSE 139 (H) 09/01/2018 0934   GLUCOSE 98 10/21/2017 1206   BUN 13 09/01/2018 0934   BUN 10 04/26/2018 1221   BUN 6.5 (L) 10/21/2017 1206   CREATININE 0.89 09/15/2018 1841   CREATININE 0.81 08/08/2018 1405   CREATININE 0.9 10/21/2017 1206   CALCIUM 9.7 09/01/2018 0934   CALCIUM 9.8 10/21/2017 1206   PROT 7.8 08/08/2018 1405   PROT 7.3 04/26/2018 1221   PROT 7.5 10/21/2017 1206   ALBUMIN 3.8 08/08/2018 1405   ALBUMIN 4.8 04/26/2018 1221   ALBUMIN 3.9 10/21/2017 1206   AST 14 (L) 08/08/2018 1405   AST 32 10/21/2017 1206   ALT 16 08/08/2018 1405   ALT 37 10/21/2017 1206   ALKPHOS 110 08/08/2018 1405   ALKPHOS 105 10/21/2017 1206   BILITOT 0.6 08/08/2018 1405   BILITOT 0.64 10/21/2017 1206   GFRNONAA >60 09/15/2018 1841   GFRNONAA >60 08/08/2018 1405   GFRAA >60 09/15/2018 1841   GFRAA >60 08/08/2018 1405    INo results found for: SPEP, UPEP  Lab Results  Component Value Date   WBC 6.3 12/22/2018   NEUTROABS 3.6 12/22/2018   HGB 12.5 12/22/2018   HCT 38.3 12/22/2018   MCV 88.0 12/22/2018   PLT 345 12/22/2018      Chemistry      Component Value Date/Time   NA 143 09/01/2018 0934   NA 138 04/26/2018 1221   NA 140 10/21/2017 1206   K 4.1 09/01/2018 0934   K 4.0 10/21/2017 1206   CL 106 09/01/2018  0934   CO2 26 09/01/2018 0934   CO2 24 10/21/2017 1206   BUN 13 09/01/2018 0934   BUN 10 04/26/2018 1221   BUN 6.5 (L) 10/21/2017 1206   CREATININE 0.89 09/15/2018 1841   CREATININE 0.81 08/08/2018 1405   CREATININE 0.9 10/21/2017 1206      Component Value Date/Time   CALCIUM 9.7 09/01/2018 0934   CALCIUM  9.8 10/21/2017 1206   ALKPHOS 110 08/08/2018 1405   ALKPHOS 105 10/21/2017 1206   AST 14 (L) 08/08/2018 1405   AST 32 10/21/2017 1206   ALT 16 08/08/2018 1405   ALT 37 10/21/2017 1206   BILITOT 0.6 08/08/2018 1405   BILITOT 0.64 10/21/2017 1206       No results found for: LABCA2  No components found for: LABCA125  No results for input(s): INR in the last 168 hours.  Urinalysis    Component Value Date/Time   COLORURINE YELLOW 08/08/2018 1407   APPEARANCEUR CLEAR 08/08/2018 1407   LABSPEC 1.013 08/08/2018 1407   LABSPEC 1.020 05/30/2015 1502   PHURINE 6.0 08/08/2018 1407   GLUCOSEU NEGATIVE 08/08/2018 1407   GLUCOSEU Negative 05/30/2015 1502   HGBUR SMALL (A) 08/08/2018 1407   BILIRUBINUR NEGATIVE 08/08/2018 1407   BILIRUBINUR negative 04/26/2018 1144   BILIRUBINUR Negative 05/30/2015 1502   KETONESUR NEGATIVE 08/08/2018 1407   PROTEINUR NEGATIVE 08/08/2018 1407   UROBILINOGEN 0.2 04/26/2018 1144   UROBILINOGEN 0.2 05/30/2015 1502   NITRITE NEGATIVE 08/08/2018 1407   LEUKOCYTESUR LARGE (A) 08/08/2018 1407   LEUKOCYTESUR Negative 05/30/2015 1502    STUDIES: No results found.  ASSESSMENT: 43 y.o. [place] woman status post left breast upper outer quadrant biopsy 02/08/2015 for ductal carcinoma in situ, grade 2 or 3, strongly estrogen and progesterone receptor positive, with likely areas of microinvasion  (a) biopsy of an area in the left breast upper outer quadrant showed PASH  (b) biopsy of 2 additional questionable areas, one in each breast, showed bilateral fibroadenomas  (1) genetics testing March 2016 through the BreastNext gene panel offered by [employer] showed no deleterious mutations in ATM, BARD1, BRCA1, BRCA2, BRIP1, CDH1, CHEK2, MRE11A, MUTYH, NBN, NF1, PALB2, PTEN, RAD50, RAD51C, RAD51D, and TP53.  (2) status post left lumpectomy with sentinel lymph node sampling 03/27/2015 for a pT1c pN0, stage IA invasive ductal carcinoma, grade 3, triple negative, with an MIB-1 of 33%  (a) close margins were cleared with subsequent excision 04/05/2015.  (3) Oncotype DX score of 38 predicts a risk of 26% outside the breast recurrence within 10 years if the patient's only systemic treatment is tamoxifen for 5 years  (4) adjuvant chemotherapy started 05/02/2015 consisting of doxorubicin and cyclophosphamide in dose dense fashion 4, completed 06/13/2015, followed by paclitaxel weekly 12.   (a) Paclitaxel stopped after only 2 cycles because of persistent neuropathy.  (5) adjuvant radiation completed 11/08  (5) tamoxifen started 02/20/2015 (neoadjuvantly), discontinued 04/19/2015 so as not to overlap chemotherapy; resumed 02/07/2016, discontinued April 2018 with disease recurrence  RECURRENT DISEASE: April 2018 (6) left breast lower outer quadrant biopsy 03/05/2017 shows invasive ductal carcinoma, grade 2, triple negative  (7) status post bilateral mastectomies and left axillary lymph node dissection 04/20/2017 showing a residual left pT2 pN0 (8 nodes removed) invasive ductal carcinoma, grade 3, with negative margins; the right breast was benign  (a) she underwent expander exchange for bilateral gel implants with capsulectomies 11/04/2017  (b) status post implant extrusion on the left, with latissimus reconstruction 09/15/2018  (c) scheduled for definitive implant and wound revision Left FEB 2020  (8) chemotherapy for her recurrence consisting of cyclophosphamide, methotrexate and fluorouracil (CMF) given every 21 days 8, started 05/13/2017  (a) eighth cycle omitted because of intercurrent plastic surgery and complications  (9) status post  cholecystectomy 07/07/2017, with benign pathology  (10) anastrozole started 12/27/2017  (a) bone density scan 03/21/2018 shows a T score of -1.4  (11) status  post hysterectomy with bilateral salpingo-oophorectomy 09/08/2018, with benign pathology  PLAN  Lajuanda continues to have problems with her reconstruction but hopefully she will get all that straightened out next week with the final procedure.  [name] tolerating the anastrozole well and the plan is to continue that for 3 more years, when she will be 5 years out from her definitive surgery for her breast cancer recurrence  I suggested that she do knee exercises now.  Otherwise within a few years she may need knee replacement.  I also suggested she consider getting a stand-up desk.  I also gave her information on our pelvic health program.  If she wishes to participate she will let me know and I will put in the referral.  She will see me again in 1 year.  She knows to call for any other issue that may develop before the next visit.  Magrinat, Virgie Dad, MD  12/22/18 9:15 AM Medical Oncology and Hematology [facility] [address] [place], [place] [address] Tel. [phone]    Fax. [phone]  I, Wilburn Mylar, am acting as scribe for Dr. Virgie Dad. Magrinat.  I, Lurline Del MD, have reviewed the above documentation for accuracy and completeness, and I agree with the above.

## 2018-12-21 NOTE — Patient Instructions (Addendum)
Here are your patient goals: Patient Goals:  -Meditate for 15 minutes each day on the way to work -Track her foods daily and switch to Purple on [employer] -Walk 15 minutes 3 days a week     Please do some "mindfulness eating meditations ".  Headspace or CALM app can be very beneficial.     Create an environment for your success!!!!          If you have insomnia or difficulty sleeping, this information is for you:  - Avoid caffeinated beverages after lunch,  no alcoholic beverages,  no eating within 2-3 hours of lying down,  avoid exposure to blue light before bed,  avoid daytime naps, and  needs to maintain a regular sleep schedule- go to sleep and wake up around the same time every night.   - Resolve concerns or worries before entering bedroom:  Discussed relaxation techniques with patient and to keep a journal to write down fears\ worries.  I suggested seeing a counselor for CBT.   - Recommend patient meditate or do deep breathing exercises to help relax.   Incorporate the use of white noise machines or listen to "sleep meditation music", or recordings of guided meditations for sleep from YouTube which are free, such as  "guided meditation for detachment from over thinking"  by Mayford Knife.

## 2018-12-21 NOTE — Progress Notes (Signed)
Wt loss OV note   Impression and Recommendations:    1. Elevated blood pressure reading   2. obesity BMI >er 40 (HCC)     - Weight Mgt: -Previous weight from 11/18/2018 was at 216 lbs, however, today (12/21/2018) the patients weight is increased to 224 lbs.  -Extensively counseled the patient regarding utilizing the Weight Watchers app as well as healthy eating habits.  -Advised the patient to start with weighing and measuring her foods.  -Explained to patient what BMI refers to, and what it means medically. Told patient to think about it as a "medical risk stratification measurement" and how increasing BMI is associated with increasing risk/ or worsening state of various diseases such as hypertension, hyperlipidemia, diabetes, premature OA, depression etc. -American Heart Association guidelines for healthy diet, basically Mediterranean diet, and exercise guidelines of 30 minutes 5 days per week or more discussed in detail. -Discussed the Calm or Headspace app to help with giving "me time" -Advised the patient to utilize a Gratefulness journal.  -Reminded patient the need for yearly complete physical exam office visits in addition to office visits for management of the chronic diseases  Patient Goals:  -Patient will meditate for 15 minutes each day on the way to work -Patient will track her foods daily and switch to Purple on Weight Watchers -Patients will walk 15 minutes 3 days a week    Education and routine counseling performed. Handouts provided.   Pt was in the office today for 20.5+ minutes, with over 50% time spent in face to face counseling of patients various medical conditions, treatment plans of those medical conditions including medicine management and lifestyle modification, strategies to improve health and well being; and in coordination of care. SEE ABOVE TREATMENT PLAN FOR DETAILS.   The patient was counseled, risk factors were discussed, anticipatory guidance  given.  -Gross side effects, risk and benefits, and alternatives of medications discussed with patient.  Patient is aware that all medications have potential side effects and we are unable to predict every side effect or drug-drug interaction that may occur.  Expresses verbal understanding and consents to current therapy plan and treatment regimen.   Return for 4wks- healthy living goals!.   Please see AVS handed out to patient at the end of our visit for further patient instructions/ counseling done pertaining to today's office visit.    Note:  This document was prepared using Dragon voice recognition software and may include unintentional dictation errors.   This document serves as a record of services personally performed by Mellody Dance, DO. It was created on her behalf by Steva Colder, a trained medical scribe. The creation of this record is based on the scribe's personal observations and the provider's statements to them.   I have reviewed the above medical documentation for accuracy and completeness and I concur.  Mellody Dance, DO 12/22/2018 9:12 PM     ______________________________________________________________________    Subjective:  HPI: Cheryl [name] y.o. female  presents for 3 month follow up for multiple medical problems.  Weight loss:  During her last visit on 11/18/2018, she weighed 216 lbs and she is now 224 lbs. She has been doing weight watchers, but she hasn't been tracking her points daily. She notes that at night, she eats worse due to having to feed her family regular meals.   She has been following up with Plastic Surgeon, Dr. Marla Roe for tissue expander procedure. She notes increasing fatigue.   Weight:  Wt Readings  from Last 3 Encounters:  12/22/18 218 lb 6.4 oz (99.1 kg)  12/21/18 224 lb (101.6 kg)  12/13/18 217 lb (98.4 kg)   BMI Readings from Last 3 Encounters:  12/22/18 39.95 kg/m  12/21/18 40.97 kg/m  12/13/18 39.69 kg/m    Lab Results  Component Value Date   HGBA1C 6.4 (A) 11/18/2018   HGBA1C 6.2 (H) 07/29/2018   HGBA1C 6.1 (H) 04/01/2018    Review of Systems: General:   No F/C, wt loss Pulm:   No DIB, SOB, pleuritic chest pain Card:  No CP, palpitations Abd:  No n/v/d or pain Ext:  No inc edema from baseline   Objective: Physical Exam: BP 131/89   Pulse 100   Temp 98.5 F (36.9 C)   Ht 5\' 2"  (1.575 m)   Wt 224 lb (101.6 kg)   LMP 04/02/2015 (Approximate) Comment: after chemo - no periods  SpO2 99%   BMI 40.97 kg/m  Body mass index is 40.97 kg/m. General: Well nourished, in no apparent distress. Eyes: PERRLA, EOMs, conjunctiva clr no swelling or erythema ENT/Mouth: Hearing appears normal.  Mucus Membranes Moist  Neck: Supple, no masses Resp: Respiratory effort- normal, ECTA B/L w/o W/R/R  Cardio: RRR w/o MRGs. Abdomen: no gross distention. Lymphatics:  Brisk peripheral pulses, less 2 sec cap RF, no gross edema  M-sk: Full ROM, 5/5 strength, normal gait.  Skin: Warm, dry without rashes, lesions, ecchymosis.  Neuro: Alert, Oriented Psych: Normal affect, Insight and Judgment appropriate.    Current Medications:  Current Outpatient Medications on File Prior to Visit  Medication Sig Dispense Refill  . b complex vitamins tablet Take 1 tablet by mouth daily.    . cholecalciferol (VITAMIN D) 1000 units tablet Take 1,000 Units by mouth daily.    Marland Kitchen ibuprofen (ADVIL,MOTRIN) 600 MG tablet Take 1 tablet (600 mg total) by mouth every 6 (six) hours as needed (mild pain). 30 tablet 0   No current facility-administered medications on file prior to visit.     Medical History:  Patient Active Problem List   Diagnosis Date Noted  . S/P mastectomy, bilateral 10/07/2018  . Acquired absence of left breast 09/15/2018  . S/P breast reconstruction 09/13/2018  . S/P laparoscopic assisted vaginal hysterectomy ([facility]) 09/08/2018  . Glucose intolerance (impaired glucose tolerance) 04/04/2018  .  Elevated LDL cholesterol level-  10-year risk less than 2% 5/19 04/04/2018  . Hypertriglyceridemia 04/04/2018  . Morbid obesity ([place]) 04/04/2018  . Vitamin D insufficiency 04/04/2018  . Health education/counseling 04/04/2018  . Family history of diabetes mellitus in mother-   in her mid 39s 04/04/2018  . Neuropathy 03/13/2018  . History of therapeutic radiation- 30+ txmnts 03/03/2018  . Port-A-Cath in place 09/09/2017  . Focal nodular hyperplasia of liver by MRI  07/26/2017  . Acquired absence of bilateral breasts and nipples 04/27/2017  . Recurrent breast cancer, left ([place]) 03/18/2017  . Genetic testing 03/05/2016  . Chemotherapy-induced neuropathy ([place]) 07/11/2015  . Bronchitis 06/13/2015  . Flank pain 05/30/2015  . UTI (urinary tract infection) 05/16/2015  . Malignant neoplasm of upper-outer quadrant of left breast in female, estrogen receptor negative ([place]) 02/13/2015    Allergies:  Allergies  Allergen Reactions  . Adhesive [Tape] Other (See Comments)    Band Aids causes Skin blisters.  All other tape is okay to use     Family history-  Reviewed; changed as appropriate  Social history-  Reviewed; changed as appropriate

## 2018-12-22 ENCOUNTER — Inpatient Hospital Stay: Payer: 59 | Attending: Oncology

## 2018-12-22 ENCOUNTER — Telehealth: Payer: Self-pay | Admitting: Oncology

## 2018-12-22 ENCOUNTER — Inpatient Hospital Stay: Payer: 59 | Admitting: Oncology

## 2018-12-22 VITALS — BP 136/87 | HR 97 | Temp 98.8°F | Resp 20 | Ht 62.0 in | Wt 218.4 lb

## 2018-12-22 DIAGNOSIS — N951 Menopausal and female climacteric states: Secondary | ICD-10-CM | POA: Insufficient documentation

## 2018-12-22 DIAGNOSIS — C50412 Malignant neoplasm of upper-outer quadrant of left female breast: Secondary | ICD-10-CM | POA: Diagnosis present

## 2018-12-22 DIAGNOSIS — Z90710 Acquired absence of both cervix and uterus: Secondary | ICD-10-CM | POA: Insufficient documentation

## 2018-12-22 DIAGNOSIS — N898 Other specified noninflammatory disorders of vagina: Secondary | ICD-10-CM

## 2018-12-22 DIAGNOSIS — Z171 Estrogen receptor negative status [ER-]: Secondary | ICD-10-CM

## 2018-12-22 DIAGNOSIS — C50912 Malignant neoplasm of unspecified site of left female breast: Secondary | ICD-10-CM

## 2018-12-22 DIAGNOSIS — E7800 Pure hypercholesterolemia, unspecified: Secondary | ICD-10-CM

## 2018-12-22 LAB — CBC WITH DIFFERENTIAL (CANCER CENTER ONLY)
Abs Immature Granulocytes: 0.02 10*3/uL (ref 0.00–0.07)
BASOS PCT: 1 %
Basophils Absolute: 0 10*3/uL (ref 0.0–0.1)
EOS ABS: 0.2 10*3/uL (ref 0.0–0.5)
Eosinophils Relative: 3 %
HCT: 38.3 % (ref 36.0–46.0)
Hemoglobin: 12.5 g/dL (ref 12.0–15.0)
Immature Granulocytes: 0 %
Lymphocytes Relative: 32 %
Lymphs Abs: 2 10*3/uL (ref 0.7–4.0)
MCH: 28.7 pg (ref 26.0–34.0)
MCHC: 32.6 g/dL (ref 30.0–36.0)
MCV: 88 fL (ref 80.0–100.0)
Monocytes Absolute: 0.4 10*3/uL (ref 0.1–1.0)
Monocytes Relative: 6 %
NRBC: 0 % (ref 0.0–0.2)
Neutro Abs: 3.6 10*3/uL (ref 1.7–7.7)
Neutrophils Relative %: 58 %
PLATELETS: 345 10*3/uL (ref 150–400)
RBC: 4.35 MIL/uL (ref 3.87–5.11)
RDW: 13.3 % (ref 11.5–15.5)
WBC Count: 6.3 10*3/uL (ref 4.0–10.5)

## 2018-12-22 LAB — CMP (CANCER CENTER ONLY)
ALT: 21 U/L (ref 0–44)
AST: 16 U/L (ref 15–41)
Albumin: 3.9 g/dL (ref 3.5–5.0)
Alkaline Phosphatase: 113 U/L (ref 38–126)
Anion gap: 13 (ref 5–15)
BILIRUBIN TOTAL: 0.8 mg/dL (ref 0.3–1.2)
BUN: 10 mg/dL (ref 6–20)
CALCIUM: 9.6 mg/dL (ref 8.9–10.3)
CO2: 23 mmol/L (ref 22–32)
Chloride: 104 mmol/L (ref 98–111)
Creatinine: 0.87 mg/dL (ref 0.44–1.00)
GFR, Est AFR Am: >60 mL/min (ref >60)
GFR, Estimated: >60 mL/min (ref >60)
Glucose, Bld: 145 mg/dL — ABNORMAL HIGH (ref 70–99)
Potassium: 4.1 mmol/L (ref 3.5–5.1)
Sodium: 140 mmol/L (ref 135–145)
Total Protein: 7.7 g/dL (ref 6.5–8.1)

## 2018-12-22 MED ORDER — ANASTROZOLE 1 MG PO TABS: 1.0000 mg | ORAL_TABLET | Freq: Every day | ORAL | 4 refills | Status: DC

## 2018-12-22 NOTE — Telephone Encounter (Signed)
Gave avs and calendar ° °

## 2018-12-27 NOTE — Anesthesia Preprocedure Evaluation (Signed)
Anesthesia Evaluation  Patient identified by MRN, date of birth, ID band Patient awake    Reviewed: Allergy & Precautions, NPO status , Patient's Chart, lab work & pertinent test results  History of Anesthesia Complications Negative for: history of anesthetic complications  Airway Mallampati: II  TM Distance: >3 FB Neck ROM: Full    Dental  (+) Teeth Intact, Dental Advisory Given   Pulmonary neg pulmonary ROS,    breath sounds clear to auscultation       Cardiovascular Exercise Tolerance: Good negative cardio ROS   Rhythm:Regular Rate:Normal     Neuro/Psych  Neuromuscular disease    GI/Hepatic negative GI ROS, Neg liver ROS,   Endo/Other     Renal/GU negative Renal ROS     Musculoskeletal negative musculoskeletal ROS (+)   Abdominal (+) + obese,   Peds  Hematology negative hematology ROS (+)   Anesthesia Other Findings   Reproductive/Obstetrics                             Anesthesia Physical  Anesthesia Plan  ASA: II  Anesthesia Plan: General   Post-op Pain Management:    Induction: Intravenous  PONV Risk Score and Plan: 3 and Dexamethasone, Ondansetron, Treatment may vary due to age or medical condition and Midazolam  Airway Management Planned: Oral ETT and LMA  Additional Equipment:   Intra-op Plan:   Post-operative Plan: Extubation in OR  Informed Consent: I have reviewed the patients History and Physical, chart, labs and discussed the procedure including the risks, benefits and alternatives for the proposed anesthesia with the patient or authorized representative who has indicated his/her understanding and acceptance.     Dental advisory given  Plan Discussed with: CRNA, Anesthesiologist and Surgeon  Anesthesia Plan Comments:         Anesthesia Quick Evaluation

## 2018-12-28 ENCOUNTER — Encounter (HOSPITAL_BASED_OUTPATIENT_CLINIC_OR_DEPARTMENT_OTHER)
Admission: RE | Disposition: A | Discharge status: Home / Self Care | Payer: Self-pay | Source: Home / Self Care | Attending: Plastic Surgery

## 2018-12-28 ENCOUNTER — Ambulatory Visit (HOSPITAL_BASED_OUTPATIENT_CLINIC_OR_DEPARTMENT_OTHER): Payer: 59 | Admitting: Anesthesiology

## 2018-12-28 ENCOUNTER — Ambulatory Visit (HOSPITAL_BASED_OUTPATIENT_CLINIC_OR_DEPARTMENT_OTHER)
Admission: RE | Admit: 2018-12-28 | Discharge: 2018-12-28 | Disposition: A | Discharge status: Home / Self Care | Payer: 59 | Attending: Plastic Surgery | Admitting: Plastic Surgery

## 2018-12-28 ENCOUNTER — Encounter (HOSPITAL_BASED_OUTPATIENT_CLINIC_OR_DEPARTMENT_OTHER): Payer: Self-pay | Admitting: *Deleted

## 2018-12-28 ENCOUNTER — Other Ambulatory Visit: Payer: Self-pay

## 2018-12-28 DIAGNOSIS — Z421 Encounter for breast reconstruction following mastectomy: Secondary | ICD-10-CM | POA: Diagnosis not present

## 2018-12-28 DIAGNOSIS — Z853 Personal history of malignant neoplasm of breast: Secondary | ICD-10-CM | POA: Diagnosis not present

## 2018-12-28 DIAGNOSIS — Z9049 Acquired absence of other specified parts of digestive tract: Secondary | ICD-10-CM | POA: Diagnosis not present

## 2018-12-28 DIAGNOSIS — Z9013 Acquired absence of bilateral breasts and nipples: Secondary | ICD-10-CM | POA: Diagnosis not present

## 2018-12-28 DIAGNOSIS — G709 Myoneural disorder, unspecified: Secondary | ICD-10-CM | POA: Insufficient documentation

## 2018-12-28 DIAGNOSIS — L905 Scar conditions and fibrosis of skin: Secondary | ICD-10-CM | POA: Diagnosis not present

## 2018-12-28 DIAGNOSIS — Z79899 Other long term (current) drug therapy: Secondary | ICD-10-CM | POA: Insufficient documentation

## 2018-12-28 DIAGNOSIS — R7303 Prediabetes: Secondary | ICD-10-CM | POA: Diagnosis not present

## 2018-12-28 DIAGNOSIS — C50412 Malignant neoplasm of upper-outer quadrant of left female breast: Secondary | ICD-10-CM | POA: Diagnosis not present

## 2018-12-28 DIAGNOSIS — E669 Obesity, unspecified: Secondary | ICD-10-CM | POA: Diagnosis not present

## 2018-12-28 DIAGNOSIS — Z6839 Body mass index (BMI) 39.0-39.9, adult: Secondary | ICD-10-CM | POA: Insufficient documentation

## 2018-12-28 DIAGNOSIS — N6032 Fibrosclerosis of left breast: Secondary | ICD-10-CM | POA: Diagnosis not present

## 2018-12-28 DIAGNOSIS — Z90710 Acquired absence of both cervix and uterus: Secondary | ICD-10-CM | POA: Insufficient documentation

## 2018-12-28 DIAGNOSIS — Z791 Long term (current) use of non-steroidal anti-inflammatories (NSAID): Secondary | ICD-10-CM | POA: Diagnosis not present

## 2018-12-28 HISTORY — PX: BREAST REDUCTION SURGERY: SHX8

## 2018-12-28 HISTORY — PX: REMOVAL OF TISSUE EXPANDER AND PLACEMENT OF IMPLANT: SHX6457

## 2018-12-28 SURGERY — REMOVAL, TISSUE EXPANDER, BREAST, WITH IMPLANT INSERTION
Anesthesia: General | Site: Breast | Laterality: Right

## 2018-12-28 MED ORDER — ACETAMINOPHEN 325 MG PO TABS: 325.0000 mg | ORAL_TABLET | ORAL | Status: DC | PRN

## 2018-12-28 MED ORDER — LIDOCAINE HCL (PF) 1 % IJ SOLN
INTRAMUSCULAR | Status: AC
  Filled 2018-12-28: qty 60

## 2018-12-28 MED ORDER — OXYCODONE HCL 5 MG PO TABS: 5.0000 mg | ORAL_TABLET | Freq: Once | ORAL | Status: DC | PRN

## 2018-12-28 MED ORDER — ACETAMINOPHEN 160 MG/5ML PO SOLN: 325.0000 mg | ORAL | Status: DC | PRN

## 2018-12-28 MED ORDER — OXYCODONE HCL 5 MG PO TABS: 5.0000 mg | ORAL_TABLET | ORAL | Status: DC | PRN

## 2018-12-28 MED ORDER — FENTANYL CITRATE (PF) 100 MCG/2ML IJ SOLN
50.0000 ug | INTRAMUSCULAR | Status: AC | PRN
  Administered 2018-12-28: 100 ug via INTRAVENOUS
  Administered 2018-12-28: 50 ug via INTRAVENOUS
  Administered 2018-12-28: 25 ug via INTRAVENOUS
  Administered 2018-12-28: 50 ug via INTRAVENOUS
  Administered 2018-12-28: 25 ug via INTRAVENOUS
  Administered 2018-12-28: 50 ug via INTRAVENOUS

## 2018-12-28 MED ORDER — PROPOFOL 10 MG/ML IV BOLUS
INTRAVENOUS | Status: DC | PRN
  Administered 2018-12-28: 200 mg via INTRAVENOUS

## 2018-12-28 MED ORDER — MIDAZOLAM HCL 2 MG/2ML IJ SOLN
INTRAMUSCULAR | Status: AC
  Filled 2018-12-28: qty 2

## 2018-12-28 MED ORDER — CEFAZOLIN SODIUM-DEXTROSE 2-4 GM/100ML-% IV SOLN
INTRAVENOUS | Status: AC
  Filled 2018-12-28: qty 100

## 2018-12-28 MED ORDER — SUGAMMADEX SODIUM 500 MG/5ML IV SOLN
INTRAVENOUS | Status: AC
  Filled 2018-12-28: qty 5

## 2018-12-28 MED ORDER — DEXAMETHASONE SODIUM PHOSPHATE 10 MG/ML IJ SOLN
INTRAMUSCULAR | Status: DC | PRN
  Administered 2018-12-28: 10 mg via INTRAVENOUS

## 2018-12-28 MED ORDER — ACETAMINOPHEN 650 MG RE SUPP: 650.0000 mg | RECTAL | Status: DC | PRN

## 2018-12-28 MED ORDER — LIDOCAINE HCL 1 % IJ SOLN: INTRAMUSCULAR | Status: DC | PRN

## 2018-12-28 MED ORDER — OXYCODONE HCL 5 MG/5ML PO SOLN: 5.0000 mg | Freq: Once | ORAL | Status: DC | PRN

## 2018-12-28 MED ORDER — SODIUM CHLORIDE 0.9 % IV SOLN
INTRAVENOUS | Status: DC | PRN
  Administered 2018-12-28 (×2): 500 mL

## 2018-12-28 MED ORDER — SODIUM CHLORIDE 0.9 % IV SOLN
INTRAVENOUS | Status: AC
  Filled 2018-12-28: qty 500000

## 2018-12-28 MED ORDER — FENTANYL CITRATE (PF) 100 MCG/2ML IJ SOLN
25.0000 ug | INTRAMUSCULAR | Status: DC | PRN
  Administered 2018-12-28: 50 ug via INTRAVENOUS

## 2018-12-28 MED ORDER — ACETAMINOPHEN 325 MG PO TABS: 650.0000 mg | ORAL_TABLET | ORAL | Status: DC | PRN

## 2018-12-28 MED ORDER — SODIUM CHLORIDE 0.9 % IV SOLN: 250.0000 mL | INTRAVENOUS | Status: DC | PRN

## 2018-12-28 MED ORDER — LIDOCAINE HCL (CARDIAC) PF 100 MG/5ML IV SOSY
PREFILLED_SYRINGE | INTRAVENOUS | Status: DC | PRN
  Administered 2018-12-28: 100 mg via INTRAVENOUS

## 2018-12-28 MED ORDER — CEFAZOLIN SODIUM-DEXTROSE 2-3 GM-%(50ML) IV SOLR
INTRAVENOUS | Status: DC | PRN
  Administered 2018-12-28: 2 g via INTRAVENOUS

## 2018-12-28 MED ORDER — MIDAZOLAM HCL 2 MG/2ML IJ SOLN
1.0000 mg | INTRAMUSCULAR | Status: DC | PRN
  Administered 2018-12-28: 2 mg via INTRAVENOUS

## 2018-12-28 MED ORDER — ROCURONIUM BROMIDE 50 MG/5ML IV SOSY
PREFILLED_SYRINGE | INTRAVENOUS | Status: AC
  Filled 2018-12-28: qty 5

## 2018-12-28 MED ORDER — LIDOCAINE-EPINEPHRINE 1 %-1:100000 IJ SOLN
INTRAMUSCULAR | Status: DC | PRN
  Administered 2018-12-28: 20 mL

## 2018-12-28 MED ORDER — FENTANYL CITRATE (PF) 100 MCG/2ML IJ SOLN
INTRAMUSCULAR | Status: AC
  Filled 2018-12-28: qty 2

## 2018-12-28 MED ORDER — EPINEPHRINE 30 MG/30ML IJ SOLN
INTRAMUSCULAR | Status: AC
  Filled 2018-12-28: qty 1

## 2018-12-28 MED ORDER — ONDANSETRON HCL 4 MG/2ML IJ SOLN: 4.0000 mg | Freq: Once | INTRAMUSCULAR | Status: DC | PRN

## 2018-12-28 MED ORDER — SODIUM CHLORIDE 0.9% FLUSH: 3.0000 mL | Freq: Two times a day (BID) | INTRAVENOUS | Status: DC

## 2018-12-28 MED ORDER — BUPIVACAINE HCL (PF) 0.25 % IJ SOLN
INTRAMUSCULAR | Status: AC
  Filled 2018-12-28: qty 30

## 2018-12-28 MED ORDER — SCOPOLAMINE 1 MG/3DAYS TD PT72: 1.0000 | MEDICATED_PATCH | Freq: Once | TRANSDERMAL | Status: DC | PRN

## 2018-12-28 MED ORDER — LIDOCAINE-EPINEPHRINE 1 %-1:100000 IJ SOLN
INTRAMUSCULAR | Status: AC
  Filled 2018-12-28: qty 1

## 2018-12-28 MED ORDER — LACTATED RINGERS IV SOLN
INTRAVENOUS | Status: DC
  Administered 2018-12-28: 08:00:00 via INTRAVENOUS

## 2018-12-28 MED ORDER — MEPERIDINE HCL 25 MG/ML IJ SOLN: 6.2500 mg | INTRAMUSCULAR | Status: DC | PRN

## 2018-12-28 MED ORDER — CEFAZOLIN SODIUM-DEXTROSE 2-4 GM/100ML-% IV SOLN: 2.0000 g | INTRAVENOUS | Status: DC

## 2018-12-28 MED ORDER — PROPOFOL 10 MG/ML IV BOLUS
INTRAVENOUS | Status: AC
  Filled 2018-12-28: qty 40

## 2018-12-28 MED ORDER — SODIUM CHLORIDE 0.9% FLUSH: 3.0000 mL | INTRAVENOUS | Status: DC | PRN

## 2018-12-28 SURGICAL SUPPLY — 81 items
BAG DECANTER FOR FLEXI CONT (MISCELLANEOUS) ×3 IMPLANT
BINDER ABDOMINAL  9 SM 30-45 (SOFTGOODS)
BINDER ABDOMINAL 10 UNV 27-48 (MISCELLANEOUS) IMPLANT
BINDER ABDOMINAL 12 SM 30-45 (SOFTGOODS) IMPLANT
BINDER ABDOMINAL 9 SM 30-45 (SOFTGOODS) IMPLANT
BINDER BREAST LRG (GAUZE/BANDAGES/DRESSINGS) IMPLANT
BINDER BREAST MEDIUM (GAUZE/BANDAGES/DRESSINGS) IMPLANT
BINDER BREAST XLRG (GAUZE/BANDAGES/DRESSINGS) IMPLANT
BINDER BREAST XXLRG (GAUZE/BANDAGES/DRESSINGS) ×3 IMPLANT
BIOPATCH RED 1 DISK 7.0 (GAUZE/BANDAGES/DRESSINGS) IMPLANT
BLADE HEX COATED 2.75 (ELECTRODE) ×3 IMPLANT
BLADE SURG 15 STRL LF DISP TIS (BLADE) ×4 IMPLANT
BLADE SURG 15 STRL SS (BLADE) ×2
BNDG GAUZE ELAST 4 BULKY (GAUZE/BANDAGES/DRESSINGS) ×6 IMPLANT
CANISTER SUCT 1200ML W/VALVE (MISCELLANEOUS) ×3 IMPLANT
CHLORAPREP W/TINT 26ML (MISCELLANEOUS) ×6 IMPLANT
COVER BACK TABLE 60X90IN (DRAPES) ×3 IMPLANT
COVER MAYO STAND STRL (DRAPES) ×3 IMPLANT
COVER WAND RF STERILE (DRAPES) IMPLANT
DECANTER SPIKE VIAL GLASS SM (MISCELLANEOUS) IMPLANT
DERMABOND ADVANCED (GAUZE/BANDAGES/DRESSINGS) ×2
DERMABOND ADVANCED .7 DNX12 (GAUZE/BANDAGES/DRESSINGS) ×4 IMPLANT
DRAIN CHANNEL 19F RND (DRAIN) IMPLANT
DRAPE LAPAROSCOPIC ABDOMINAL (DRAPES) ×3 IMPLANT
DRSG PAD ABDOMINAL 8X10 ST (GAUZE/BANDAGES/DRESSINGS) ×6 IMPLANT
ELECT BLADE 4.0 EZ CLEAN MEGAD (MISCELLANEOUS) ×3
ELECT COATED BLADE 2.86 ST (ELECTRODE) ×3 IMPLANT
ELECT REM PT RETURN 9FT ADLT (ELECTROSURGICAL) ×3
ELECTRODE BLDE 4.0 EZ CLN MEGD (MISCELLANEOUS) ×2 IMPLANT
ELECTRODE REM PT RTRN 9FT ADLT (ELECTROSURGICAL) ×2 IMPLANT
EVACUATOR SILICONE 100CC (DRAIN) IMPLANT
EXTRACTOR CANIST REVOLVE STRL (CANNISTER) IMPLANT
GAUZE SPONGE 4X4 12PLY STRL LF (GAUZE/BANDAGES/DRESSINGS) ×6 IMPLANT
GLOVE BIO SURGEON STRL SZ 6.5 (GLOVE) ×6 IMPLANT
GOWN STRL REUS W/ TWL LRG LVL3 (GOWN DISPOSABLE) ×4 IMPLANT
GOWN STRL REUS W/TWL LRG LVL3 (GOWN DISPOSABLE) ×2
IMPL BREAST GEL 380CC (Breast) ×2 IMPLANT
IMPLANT BREAST GEL 380CC (Breast) ×3 IMPLANT
IV LACTATED RINGERS 1000ML (IV SOLUTION) ×3 IMPLANT
IV NS 1000ML (IV SOLUTION)
IV NS 1000ML BAXH (IV SOLUTION) IMPLANT
LINER CANISTER 1000CC FLEX (MISCELLANEOUS) ×3 IMPLANT
NDL SAFETY ECLIPSE 18X1.5 (NEEDLE) ×2 IMPLANT
NEEDLE HYPO 18GX1.5 SHARP (NEEDLE) ×1
NEEDLE HYPO 25X1 1.5 SAFETY (NEEDLE) ×3 IMPLANT
PACK BASIN DAY SURGERY FS (CUSTOM PROCEDURE TRAY) ×3 IMPLANT
PAD ALCOHOL SWAB (MISCELLANEOUS) ×3 IMPLANT
PENCIL BUTTON HOLSTER BLD 10FT (ELECTRODE) ×3 IMPLANT
PIN SAFETY STERILE (MISCELLANEOUS) IMPLANT
SHEET MEDIUM DRAPE 40X70 STRL (DRAPES) ×3 IMPLANT
SIZER BREAST REUSE 375CC (SIZER) ×1
SIZER BREAST REUSE 380CC (SIZER) ×3
SIZER BRST REUSE 380CC (SIZER) ×2 IMPLANT
SIZER BRST REUSE P4.8 12 375CC (SIZER) ×2 IMPLANT
SLEEVE SCD COMPRESS KNEE MED (MISCELLANEOUS) ×3 IMPLANT
SPONGE LAP 18X18 RF (DISPOSABLE) ×6 IMPLANT
STRIP SUTURE WOUND CLOSURE 1/2 (SUTURE) IMPLANT
SUT MNCRL AB 3-0 PS2 18 (SUTURE) IMPLANT
SUT MNCRL AB 4-0 PS2 18 (SUTURE) ×15 IMPLANT
SUT MON AB 3-0 SH 27 (SUTURE) ×3
SUT MON AB 3-0 SH27 (SUTURE) ×6 IMPLANT
SUT MON AB 5-0 PS2 18 (SUTURE) ×6 IMPLANT
SUT PDS 3-0 CT2 (SUTURE)
SUT PDS AB 2-0 CT2 27 (SUTURE) IMPLANT
SUT PDS II 3-0 CT2 27 ABS (SUTURE) IMPLANT
SUT SILK 3 0 PS 1 (SUTURE) IMPLANT
SUT VIC AB 3-0 SH 27 (SUTURE) ×1
SUT VIC AB 3-0 SH 27X BRD (SUTURE) ×2 IMPLANT
SUT VICRYL 4-0 PS2 18IN ABS (SUTURE) ×3 IMPLANT
SYR 10ML LL (SYRINGE) IMPLANT
SYR 3ML 18GX1 1/2 (SYRINGE) ×3 IMPLANT
SYR 50ML LL SCALE MARK (SYRINGE) ×3 IMPLANT
SYR BULB IRRIGATION 50ML (SYRINGE) ×3 IMPLANT
SYR CONTROL 10ML LL (SYRINGE) ×3 IMPLANT
SYR TOOMEY 50ML (SYRINGE) IMPLANT
TOWEL GREEN STERILE FF (TOWEL DISPOSABLE) ×6 IMPLANT
TUBE CONNECTING 20X1/4 (TUBING) ×3 IMPLANT
TUBING INFILTRATION IT-10001 (TUBING) ×3 IMPLANT
TUBING SET GRADUATE ASPIR 12FT (MISCELLANEOUS) ×3 IMPLANT
UNDERPAD 30X30 (UNDERPADS AND DIAPERS) ×6 IMPLANT
YANKAUER SUCT BULB TIP NO VENT (SUCTIONS) ×3 IMPLANT

## 2018-12-28 NOTE — Op Note (Signed)
Op report Unilateral Breast Exchange   DATE OF OPERATION:  12/28/2018  LOCATION: [place]  SURGICAL DIVISION: Plastic Surgery  PREOPERATIVE DIAGNOSES:  1. History of left breast cancer.  2. Acquired absence of left breast.   POSTOPERATIVE DIAGNOSES:  1. History of left breast cancer.  2. Acquired absence of bilateral breast.   PROCEDURE:  1. Exchange of left breast tissue expander for implant. 2. Capsulotomies for implant respositioning. 3. Liposuction of medial right breast. 4. Excision of left medial breast tissue 2 x 3 cm.  SURGEON: Ison Wichmann Sanger Lalonnie Shaffer, DO  ASSISTANT: Carmen Mayo,PA   ANESTHESIA:  General.   COMPLICATIONS: None.   IMPLANTS: [employer] Smooth Round High Profile Xtra Gel 380cc. Ref #[id number].  Serial Number [id number]  INDICATIONS FOR PROCEDURE:  The patient, Cheryl [name], is a 43 y.o. female born on Mar 22, 1976, is here for treatment for further treatment after a mastectomy and placement of a tissue expander. She then had the implant placed.  Due to the radiation there was skin breakdown.  She had a latissimus and expander placed. She now presents for exchange of her expander for an implant.  She requires capsulotomies to better position the implant. MRN: [id number]  CONSENT:  Informed consent was obtained directly from the patient. Risks, benefits and alternatives were fully discussed. Specific risks including but not limited to bleeding, infection, hematoma, seroma, scarring, pain, implant infection, implant extrusion, capsular contracture, asymmetry, wound healing problems, and need for further surgery were all discussed. The patient did have an ample opportunity to have her questions answered to her satisfaction.   DESCRIPTION OF PROCEDURE:  The patient was taken to the operating room. SCDs were placed and IV antibiotics were given. The patient's chest was prepped and draped in a sterile fashion. A time out was performed  and the implants to be used were identified.  Local with epinephrine was used to infiltrate the area.    Right:  The local was placed on the medial right breast.  Liposuction was done medially to help with contour and symmetry.  Left: The old latissimus scar was opened completely around the flap.  Care was taken to not undermine the muscle superiorly.  The flap was lifted superficial to the muscle down to the inframammary fold.  The latissimus was split to expose the tissue expander which was removed. Inspection of the pocket showed a normal healthy capsule.   Circumferential capsulotomies were performed to allow for breast pocket expansion.  Measurements were made to confirm adequate pocket size for the implant dimensions.  Hemostasis was ensured with electrocautery.  The pocket was irrigated with antibiotic solution.  New gloves were placed.  The implant was placed in the pocket and oriented appropriately.   There was a 2 x 3 cm area of thin skin with possible cyst like appearance that was medial to the previous scar of the latissimus.  This 2 x 2 cm area was excised. The flap was positioned more medially.  This was sent to pathology.  The latissimus muscle was re-closed with a 3-0 interrupted Monocryl suture. The remaining skin was closed with 4-0 Monocryl deep dermal and 5-0 Monocryl subcuticular stitches.  Dermabond was applied.  A breast binder and ABD was applied.  The patient was allowed to wake from anesthesia and taken to the recovery room in satisfactory condition.  The advanced practice practitioner (APP) assisted throughout the case.  The APP was essential in retraction and counter traction when needed to make the case progress  smoothly.  This retraction and assistance made it possible to see the tissue plans for the procedure.  The assistance was needed for blood control, tissue re-approximation and assisted with closure of the incision site.

## 2018-12-28 NOTE — Transfer of Care (Signed)
Immediate Anesthesia Transfer of Care Note  Patient: Cheryl [name]  Procedure(s) Performed: Removal of left breast expander and placement of silicone implant. (Left Breast) Liposuction of medial right breast for improved symmetry (Right Breast)  Patient Location: PACU  Anesthesia Type:General  Level of Consciousness: awake, alert  and oriented  Airway & Oxygen Therapy: Patient Spontanous Breathing and Patient connected to face mask oxygen  Post-op Assessment: Report given to RN and Post -op Vital signs reviewed and stable  Post vital signs: Reviewed and stable  Last Vitals:  Vitals Value Taken Time  BP    Temp    Pulse 105 12/28/2018 10:33 AM  Resp    SpO2 100 % 12/28/2018 10:33 AM  Vitals shown include unvalidated device data.  Last Pain:  Vitals:   12/28/18 0756  TempSrc: Oral  PainSc: 0-No pain         Complications: No apparent anesthesia complications

## 2018-12-28 NOTE — Discharge Instructions (Addendum)
°Post Anesthesia Home Care Instructions ° °Activity: °Get plenty of rest for the remainder of the day. A responsible individual must stay with you for 24 hours following the procedure.  °For the next 24 hours, DO NOT: °-Drive a car °-Operate machinery °-Drink alcoholic beverages °-Take any medication unless instructed by your physician °-Make any legal decisions or sign important papers. ° °Meals: °Start with liquid foods such as gelatin or soup. Progress to regular foods as tolerated. Avoid greasy, spicy, heavy foods. If nausea and/or vomiting occur, drink only clear liquids until the nausea and/or vomiting subsides. Call your physician if vomiting continues. ° °Special Instructions/Symptoms: °Your throat may feel dry or sore from the anesthesia or the breathing tube placed in your throat during surgery. If this causes discomfort, gargle with warm salt water. The discomfort should disappear within 24 hours. ° °If you had a scopolamine patch placed behind your ear for the management of post- operative nausea and/or vomiting: ° °1. The medication in the patch is effective for 72 hours, after which it should be removed.  Wrap patch in a tissue and discard in the trash. Wash hands thoroughly with soap and water. °2. You may remove the patch earlier than 72 hours if you experience unpleasant side effects which may include dry mouth, dizziness or visual disturbances. °3. Avoid touching the patch. Wash your hands with soap and water after contact with the patch. °   °INSTRUCTIONS FOR AFTER BREAST SURGERY ° ° °You are getting ready to undergo breast surgery.  You will likely have some questions about what to expect following your operation.  The following information will help you and your family understand what to expect when you are discharged from the hospital.  Following these guidelines will help ensure a smooth recovery and reduce risks of complications.   °Postoperative instructions include information on: diet,  wound care, medications and physical activity. ° °AFTER SURGERY °Expect to go home after the procedure.  In some cases, you may need to spend one night in the hospital for observation. ° °DIET °Breast surgery does not require a specific diet.  However, I have to mention that the healthier you eat the better your body can start healing. It is important to increasing your protein intake.  This means limiting the foods with sugar and carbohydrates.  Focus on vegetables and some meat.  If you have any liposuction during your procedure be sure to drink water.  If your urine is bright yellow, then it is concentrated, and you need to drink more water.  As a general rule after surgery, you should have 8 ounces of water every hour while awake.  If you find you are persistently nauseated or unable to take in liquids let us know.  NO TOBACCO USE or EXPOSURE.  This will slow your healing process and increase the risk of a wound. ° °WOUND CARE °You can shower the day after surgery if you don't have a drain.  Use fragrance free soap.  Dial, Dove and Ivory are usually mild on the skin. If you have a drain clean with baby wipes until the drain is removed.  If you have steri-strips / tape directly attached to your skin leave them in place. It is OK to get these wet.  No baths, pools or hot tubs for two weeks. °We close your incision to leave the smallest and best-looking scar. No ointment or creams on your incisions until given the go ahead.  Especially not Neosporin (Too many skin   reactions with this one).  A few weeks after surgery you can use Mederma and start massaging the scar. °We ask you to wear your binder or sports bra for the first 6 weeks around the clock, including while sleeping. This provides added comfort and helps reduce the fluid accumulation at the surgery site. ° °ACTIVITY °No heavy lifting until cleared by the doctor.  This usually means no more than a half-gallon of milk.  It is OK to walk and climb stairs. In  fact, moving your legs is very important to decrease your risk of a blood clot.  It will also help keep you from getting deconditioned.  Every 1 to 2 hours get up and walk for 5 minutes. This will help with a quicker recovery back to normal.  Let pain be your guide so you don't do too much.  NO, you cannot do the spring cleaning and don't plan on taking care of anyone else.  This is your time for TLC.  °You will be more comfortable if you sleep and rest with your head elevated either with a few pillows under you or in a recliner.  No stomach sleeping for a few months. ° °WORK °Everyone returns to work at different times. As a rough guide, most people take at least 1 - 2 weeks off prior to returning to work. If you need documentation for your job, bring the forms to your postoperative follow up visit. ° °DRIVING °Arrange for someone to bring you home from the hospital.  You may be able to drive a few days after surgery but not while taking any narcotics or valium. ° °BOWEL MOVEMENTS °Constipation can occur after anesthesia and while taking pain medication.  It is important to stay ahead for your comfort.  We recommend taking Milk of Magnesia (2 tablespoons; twice a day) while taking the pain pills. ° °SEROMA °This is fluid your body tried to put in the surgical site.  This is normal but if it creates tight skinny skin let us know.  It usually decreases in a few weeks. ° °WHEN TO CALL °Call your surgeon's office if any of the following occur: °• Fever 101 degrees F or greater °• Excessive bleeding or fluid from the incision site. °• Pain that increases over time without aid from the medications °• Redness, warmth, or pus draining from incision sites °• Persistent nausea or inability to take in liquids °• Severe misshapen area that underwent the operation. ° °Here are some resources: ° °1. Plastic surgery website:  https://www.plasticsurgery.org/for-medical-professionals/education-and-resources/publications/breast-reconstruction-magazine °2. Breast Reconstruction Awareness Campaign:  http://www.breastreconusa.org/ °3. Plastic surgery Implant information:  https://www.plasticsurgery.org/patient-safety/breast-implant-safety ° °

## 2018-12-28 NOTE — Interval H&P Note (Signed)
History and Physical Interval Note:  12/28/2018 7:55 AM  Cheryl [name]  has presented today for surgery, with the diagnosis of Malignant Neoplasm Of Upper-outer Quadrant Of Left Breast In Female  The various methods of treatment have been discussed with the patient and family. After consideration of risks, benefits and other options for treatment, the patient has consented to  Procedure(s): Removal of left breast expander and placement of silicone implant. (Left) Liposuction of medial right breast for improved symmetry (Right) as a surgical intervention .  The patient's history has been reviewed, patient examined, no change in status, stable for surgery.  I have reviewed the patient's chart and labs.  Questions were answered to the patient's satisfaction.     Cheryl [name] Cheryl [name]

## 2018-12-28 NOTE — Anesthesia Postprocedure Evaluation (Signed)
Anesthesia Post Note  Patient: Cheryl [name]  Procedure(s) Performed: Removal of left breast expander and placement of silicone implant. (Left Breast) Liposuction of medial right breast for improved symmetry (Right Breast)     Patient location during evaluation: PACU Anesthesia Type: General Level of consciousness: awake and alert Pain management: pain level controlled Vital Signs Assessment: post-procedure vital signs reviewed and stable Respiratory status: spontaneous breathing, nonlabored ventilation, respiratory function stable and patient connected to nasal cannula oxygen Cardiovascular status: blood pressure returned to baseline and stable Postop Assessment: no apparent nausea or vomiting Anesthetic complications: no    Last Vitals:  Vitals:   12/28/18 1116 12/28/18 1130  BP: (!) 125/92 124/81  Pulse: 94 95  Resp: 17 18  Temp:    SpO2: 95% 95%    Last Pain:  Vitals:   12/28/18 1130  TempSrc:   PainSc: 3                  Temima Kutsch

## 2018-12-29 ENCOUNTER — Encounter (HOSPITAL_BASED_OUTPATIENT_CLINIC_OR_DEPARTMENT_OTHER): Payer: Self-pay | Admitting: Plastic Surgery

## 2019-01-03 ENCOUNTER — Ambulatory Visit (INDEPENDENT_AMBULATORY_CARE_PROVIDER_SITE_OTHER): Payer: 59 | Admitting: Plastic Surgery

## 2019-01-03 ENCOUNTER — Encounter: Payer: Self-pay | Admitting: Plastic Surgery

## 2019-01-03 VITALS — BP 122/85 | HR 100 | Ht 62.0 in | Wt 214.0 lb

## 2019-01-03 DIAGNOSIS — Z9882 Breast implant status: Secondary | ICD-10-CM

## 2019-01-03 DIAGNOSIS — Z9013 Acquired absence of bilateral breasts and nipples: Secondary | ICD-10-CM

## 2019-01-03 DIAGNOSIS — Z9012 Acquired absence of left breast and nipple: Secondary | ICD-10-CM

## 2019-01-03 DIAGNOSIS — Z9889 Other specified postprocedural states: Secondary | ICD-10-CM

## 2019-01-03 NOTE — Progress Notes (Signed)
   Subjective:    Patient ID: Cheryl [name], female    DOB: 09-29-1976, 43 y.o.   MRN: [id number]  The patient is a 43 year old female who is here for follow-up after her left breast reconstruction.  She had advancement of the latissimus with exchange of the expander for the implant.  [name] doing very well.  No drains.  No sign of hematoma or seroma.  Her skin is healing well and the incision is intact.  [name] happy with her size.  Review of Systems  Constitutional: Negative.   HENT: Negative.   Eyes: Negative.   Respiratory: Negative.   Cardiovascular: Negative.   Gastrointestinal: Negative.   Endocrine: Negative.   Genitourinary: Positive for vaginal discharge.  Musculoskeletal: Negative.   Hematological: Negative.   Psychiatric/Behavioral: Negative.        Objective:   Physical Exam Vitals signs and nursing note reviewed.  Constitutional:      Appearance: Normal appearance.  HENT:     Head: Normocephalic and atraumatic.  Cardiovascular:     Rate and Rhythm: Normal rate.  Pulmonary:     Effort: Pulmonary effort is normal.  Abdominal:     General: Abdomen is flat.  Neurological:     General: No focal deficit present.     Mental Status: [name] alert.  Psychiatric:        Mood and Affect: Mood normal.        Thought Content: Thought content normal.        Judgment: Judgment normal.        Assessment & Plan:  S/P mastectomy, bilateral  Acquired absence of left breast  S/P breast reconstruction  May return to work next week.  No heavy lifting yet but can increase slowly each day.  The need to talk to her about the nipple areole a tattoo and they will get that set up in the next couple of months.

## 2019-01-20 ENCOUNTER — Ambulatory Visit (INDEPENDENT_AMBULATORY_CARE_PROVIDER_SITE_OTHER): Payer: 59 | Admitting: Plastic Surgery

## 2019-01-20 ENCOUNTER — Encounter: Payer: Self-pay | Admitting: Plastic Surgery

## 2019-01-20 VITALS — BP 149/96 | HR 84 | Temp 98.3°F | Ht 62.0 in | Wt 214.0 lb

## 2019-01-20 DIAGNOSIS — Z9882 Breast implant status: Secondary | ICD-10-CM

## 2019-01-20 DIAGNOSIS — Z9012 Acquired absence of left breast and nipple: Secondary | ICD-10-CM

## 2019-01-20 DIAGNOSIS — Z9013 Acquired absence of bilateral breasts and nipples: Secondary | ICD-10-CM

## 2019-01-20 DIAGNOSIS — Z9889 Other specified postprocedural states: Secondary | ICD-10-CM

## 2019-01-20 NOTE — Progress Notes (Signed)
   Subjective:    Patient ID: Cheryl [name], female    DOB: June 07, 1976, 43 y.o.   MRN: [id number]  The patient is a 43 year old female here for follow-up on her bilateral breast reconstruction.  She had expander implant placement on the right and latissimus implant placement on the left.  There is no signs of seroma or hematoma.  She has some firmness on the lateral aspect of the left side.  [name] this was also the site she was radiated on.  Her skin is healing very nicely.  There is no signs of infection either.  [name] happy with her symmetry.  [name] back to work.  She would like to start increasing her range of motion and lifting and I think that will be fine.   Review of Systems  Constitutional: Positive for activity change. Negative for appetite change.  HENT: Negative.   Eyes: Negative.   Respiratory: Negative.   Cardiovascular: Negative.   Genitourinary: Negative.   Musculoskeletal: Negative.   Hematological: Negative.   Psychiatric/Behavioral: Negative.        Objective:   Physical Exam Vitals signs and nursing note reviewed.  HENT:     Head: Normocephalic.  Neck:     Musculoskeletal: Normal range of motion.  Cardiovascular:     Rate and Rhythm: Normal rate.  Pulmonary:     Effort: Pulmonary effort is normal.  Neurological:     General: No focal deficit present.     Mental Status: [name] alert.  Psychiatric:        Mood and Affect: Mood normal.        Thought Content: Thought content normal.        Judgment: Judgment normal.        Assessment & Plan:  S/P mastectomy, bilateral  Acquired absence of left breast  S/P breast reconstruction  I will submit for nipple areole a tattoo with [facility].  I would like to see her back in a year for follow-up.  We discussed the new recommendations for evaluation of implants and she agrees with ultrasound or MRI as needed and U/S every other year as an option.  [name] to let me know if anything changes.

## 2019-01-23 ENCOUNTER — Ambulatory Visit: Payer: 59 | Admitting: Family Medicine

## 2019-02-06 DIAGNOSIS — Z9011 Acquired absence of right breast and nipple: Secondary | ICD-10-CM | POA: Diagnosis not present

## 2019-02-06 DIAGNOSIS — C50912 Malignant neoplasm of unspecified site of left female breast: Secondary | ICD-10-CM | POA: Diagnosis not present

## 2019-03-09 ENCOUNTER — Other Ambulatory Visit: Payer: Self-pay

## 2019-03-09 ENCOUNTER — Encounter: Payer: Self-pay | Admitting: Family Medicine

## 2019-03-09 ENCOUNTER — Ambulatory Visit (INDEPENDENT_AMBULATORY_CARE_PROVIDER_SITE_OTHER): Payer: 59 | Admitting: Family Medicine

## 2019-03-09 VITALS — Ht 62.0 in | Wt 214.0 lb

## 2019-03-09 DIAGNOSIS — R39198 Other difficulties with micturition: Secondary | ICD-10-CM

## 2019-03-09 DIAGNOSIS — R350 Frequency of micturition: Secondary | ICD-10-CM | POA: Diagnosis not present

## 2019-03-09 DIAGNOSIS — R102 Pelvic and perineal pain: Secondary | ICD-10-CM | POA: Diagnosis not present

## 2019-03-09 DIAGNOSIS — R82998 Other abnormal findings in urine: Secondary | ICD-10-CM

## 2019-03-09 LAB — POCT URINALYSIS DIPSTICK
Bilirubin, UA: NEGATIVE
Glucose, UA: NEGATIVE
Ketones, UA: NEGATIVE
Nitrite, UA: NEGATIVE
Protein, UA: NEGATIVE
Spec Grav, UA: >=1.03 — AB (ref 1.010–1.025)
Urobilinogen, UA: 0.2 E.U./dL
pH, UA: 6 (ref 5.0–8.0)

## 2019-03-09 MED ORDER — PHENAZOPYRIDINE HCL 200 MG PO TABS: 200.0000 mg | ORAL_TABLET | Freq: Three times a day (TID) | ORAL | 0 refills | Status: AC

## 2019-03-09 NOTE — Progress Notes (Signed)
Virtual Visit via Telephone Note for [employer], D.O- Primary Care Physician at [facility]   I connected with current patient today by telephone and verified that I am speaking with the correct person using two identifiers.   Because of federal recommendations of social distancing due to the current novel COVID-19 outbreak, an audio/video telehealth visit is felt to be most appropriate for this patient at this time.  My staff members also discussed with the patient that there may be a patient charge related to this service.   The patient expressed understanding, and agreed to proceed.    History of Present Illness:   Sx started over wkend- lower abd discomfort/ pressure and inc freq, and dec amnt of urination.   NO F/C, n/v.  No vag d/c.   Patient had UTI in the past and this feels similar to those symptoms she had then.        Wt Readings from Last 3 Encounters:  03/09/19 214 lb (97.1 kg)  01/20/19 214 lb (97.1 kg)  01/03/19 214 lb (97.1 kg)    BP Readings from Last 3 Encounters:  01/20/19 (!) 149/96  01/03/19 122/85  12/28/18 123/81    Pulse Readings from Last 3 Encounters:  01/20/19 84  01/03/19 100  12/28/18 (!) 101    BMI Readings from Last 3 Encounters:  03/09/19 39.14 kg/m  01/20/19 39.14 kg/m  01/03/19 39.14 kg/m      -Vitals obtained; Medications, allergies reconciled;  personal medical, social, Sx etc. etc. histories were updated by Lanier Prude the medical assistant today and are reflected in below chart   Patient Care Team    Relationship Specialty Notifications Start End  Mellody Dance, DO PCP - General Family Medicine  03/03/18   [name], MD Consulting Physician Obstetrics and Gynecology  02/13/15   [name], MD Consulting Physician General Surgery  02/13/15   Magrinat, Virgie Dad, MD Consulting Physician Oncology  02/13/15   [name], RN Registered Nurse   02/13/15   [name], RN Registered Nurse    02/13/15   Dillingham, [name], DO Attending Physician Plastic Surgery  03/21/17      Patient Active Problem List   Diagnosis Date Noted  . S/P mastectomy, bilateral 10/07/2018  . Acquired absence of left breast 09/15/2018  . S/P breast reconstruction 09/13/2018  . S/P laparoscopic assisted vaginal hysterectomy (LAVH) 09/08/2018  . Glucose intolerance (impaired glucose tolerance) 04/04/2018  . Elevated LDL cholesterol level-  10-year risk less than 2% 5/19 04/04/2018  . Hypertriglyceridemia 04/04/2018  . Morbid obesity ([place]) 04/04/2018  . Vitamin D insufficiency 04/04/2018  . Health education/counseling 04/04/2018  . Family history of diabetes mellitus in mother-   in her mid 25s 04/04/2018  . Neuropathy 03/13/2018  . History of therapeutic radiation- 30+ txmnts 03/03/2018  . Port-A-Cath in place 09/09/2017  . Focal nodular hyperplasia of liver by MRI  07/26/2017  . Acquired absence of bilateral breasts and nipples 04/27/2017  . Recurrent breast cancer, left ([place]) 03/18/2017  . Genetic testing 03/05/2016  . Chemotherapy-induced neuropathy ([place]) 07/11/2015  . Bronchitis 06/13/2015  . Flank pain 05/30/2015  . UTI (urinary tract infection) 05/16/2015  . Malignant neoplasm of upper-outer quadrant of left breast in female, estrogen receptor negative ([place]) 02/13/2015     Current Meds  Medication Sig  . anastrozole (ARIMIDEX) 1 MG tablet Take 1 tablet (1 mg total) by mouth daily.  Marland Kitchen b complex vitamins tablet Take 1  tablet by mouth daily.  Marland Kitchen ibuprofen (ADVIL,MOTRIN) 600 MG tablet Take 1 tablet (600 mg total) by mouth every 6 (six) hours as needed (mild pain).  . Vitamin D, Ergocalciferol, (DRISDOL) 1.25 MG (50000 UT) CAPS capsule Take 1 capsule by mouth once a week.     Allergies:  Allergies  Allergen Reactions  . Adhesive [Tape] Other (See Comments)    Band Aids causes Skin blisters.  All other tape is okay to use     ROS:  See above HPI for pertinent positives and  negatives   Objective:   Height 5\' 2"  (1.575 m), weight 214 lb (97.1 kg), last menstrual period 04/02/2015. (if some vitals are omitted, this means that patient was UNABLE to obtain them even though asked to get them prior to [place] today) General: sounds in no acute distress.  Skin: Pt confirms warm and dry  extremities and pink fingertips Respiratory: speaking in full sentences, no conversational dyspnea Psych: A and O *3, appears insight good, mood- full      Impression and Recommendations:      ICD-10-CM   1. Urine frequency R35.0 POCT urinalysis dipstick    Urine Culture    phenazopyridine (PYRIDIUM) 200 MG tablet  2. Decreased urine stream R39.198 phenazopyridine (PYRIDIUM) 200 MG tablet  3. Suprapubic discomfort R10.2 phenazopyridine (PYRIDIUM) 200 MG tablet  4. Urine leukocytes R82.998      --Discussed with patient that nitrates were negative and hence, we should wait to see what the culture shows.  Explained patient can take anywhere from 2 up to 5 to 6 days to see if any of the bacteria grows out. -In the meantime she will use Tylenol and push fluids. -Patient preferred prescription Pyridium versus over-the-counter and that prescription was sent for her. -She knows to call us sooner if any increased symptoms of lower abdominal discomfort or if she develops any fever chills nausea vomiting etc.  As part of my medical decision making, I reviewed the following data within the [facility] History obtained from pt/family, CMA notes reviewed and incorporated, Labs reviewed, Radiograph/ tests reviewed if applicable and OV notes from prior OV's with me, as well as other specialists he has seen since seeing me last, were all reviewed and used in my medical decision making process today. Additionally, discussion had with patient regarding txmnt plan, their biases about that plan etc were used in my medical decision making today.  I discussed the assessment and treatment  plan with the patient. The patient was provided an opportunity to ask questions and all were answered.  The patient agreed with the plan and demonstrated an understanding of the instructions.   No barriers to understanding were identified.  Red flag symptoms and signs discussed in detail.  Patient expressed understanding regarding what to do in case of emergency\urgent symptoms   The patient was advised to call back or seek an in-person evaluation if the symptoms worsen or if the condition fails to improve as anticipated.   Return if symptoms worsen or fail to improve and also,, for Follow-up and of May/early June for chronic condition office visit.    Orders Placed This Encounter  Procedures  . Urine Culture  . POCT urinalysis dipstick    Meds ordered this encounter  Medications  . phenazopyridine (PYRIDIUM) 200 MG tablet    Sig: Take 1 tablet (200 mg total) by mouth 3 (three) times daily for 3 days.    Dispense:  9 tablet  Refill:  0    Medications Discontinued During This Encounter  Medication Reason  . cholecalciferol (VITAMIN D) 1000 units tablet Patient Preference      **Gross side effects, risk and benefits, and alternatives of medications and treatment plan in general discussed with patient.  Patient is aware that all medications have potential side effects and we are unable to predict every side effect or drug-drug interaction that may occur.   Patient was strongly encouraged to call with any questions or concerns they may have concerns.     I provided 8+ minutes of non-face-to-face time during this encounter.   Mellody Dance, DO

## 2019-03-11 LAB — URINE CULTURE

## 2019-03-11 IMAGING — MG STEREOTACTIC CORE NEEDLE BIOPSY
8 of 16 series · 8 of 28 positions shown · non-contrast
Comparison: Previous exams.

ADDENDUM:
Pathology revealed grade II invasive mammary carcinoma and mammary
carcinoma in situ in the left breast. This was found to be
concordant by Dr. Demetri Delos Reyes. Pathology results were discussed
with the patient by telephone. The patient reported doing well after
the biopsy with tenderness at the site. Post biopsy instructions and
care were reviewed and questions were answered. The patient was
encouraged to call The [REDACTED] for any
additional concerns. Surgical consultation has been arranged with
Dr. Noriatsu Saori at [REDACTED] on [address]

Pathology results reported by Jaylan Thong RN, BSN on 03/08/2017.
CLINICAL DATA: Patient presents for stereotactic guided core biopsy
of asymmetry in the left breast.
EXAM:
LEFT BREAST STEREOTACTIC CORE NEEDLE BIOPSY

[L (1 of 8)]
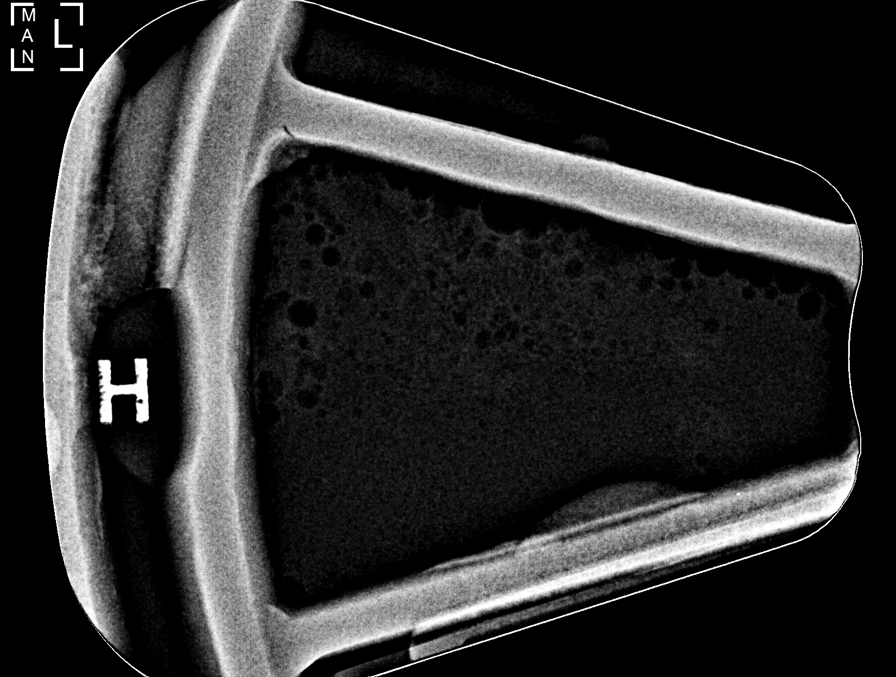

[L (2 of 8)]
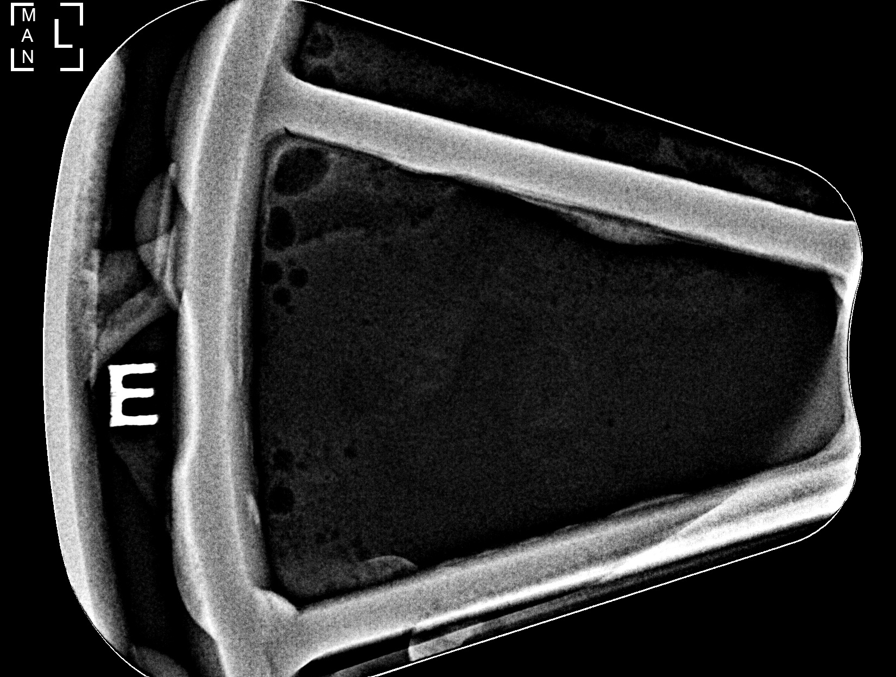

[L (3 of 8)]
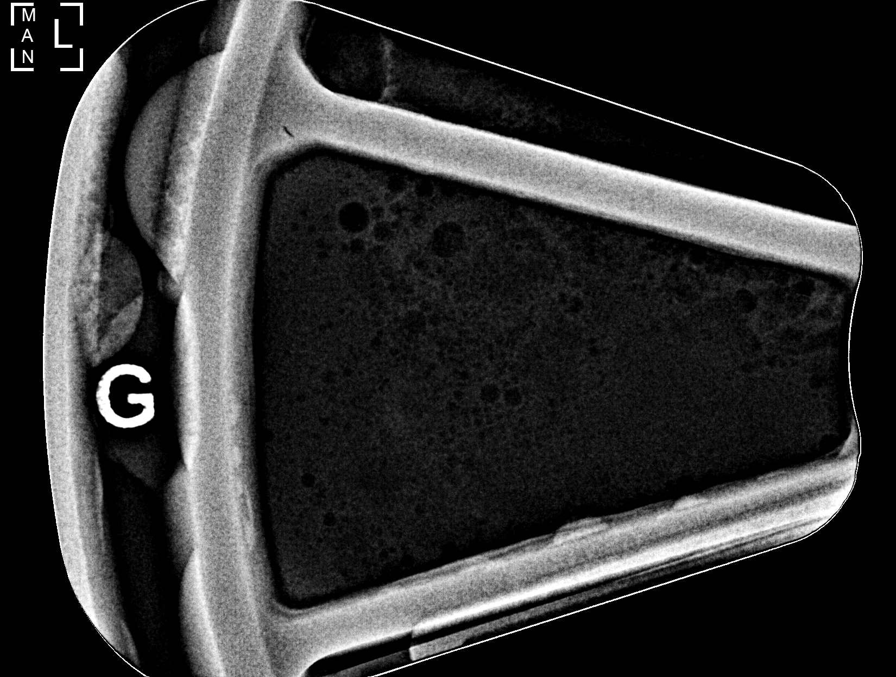

[L (4 of 8)]
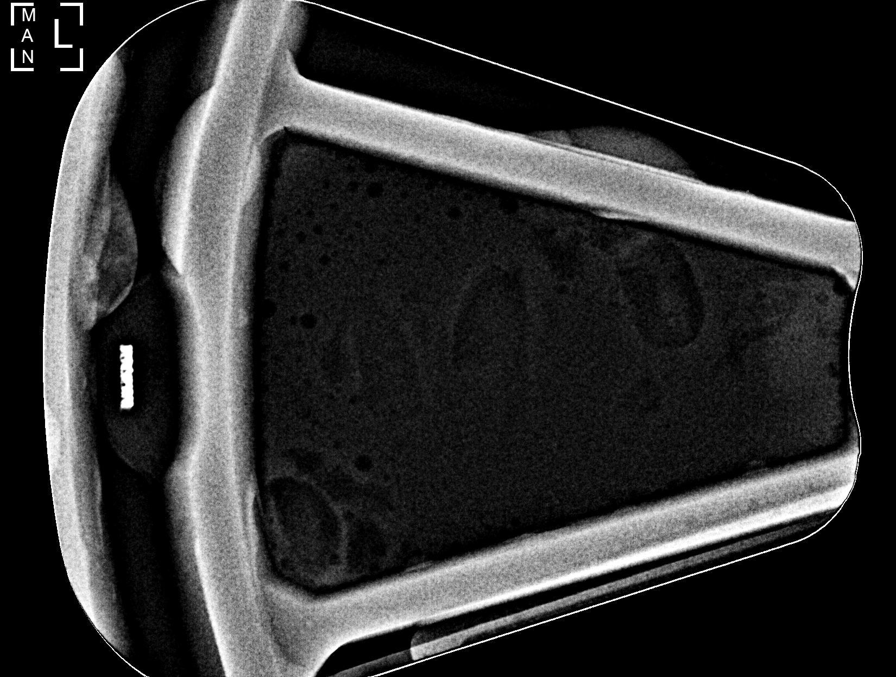

[L (5 of 8)]
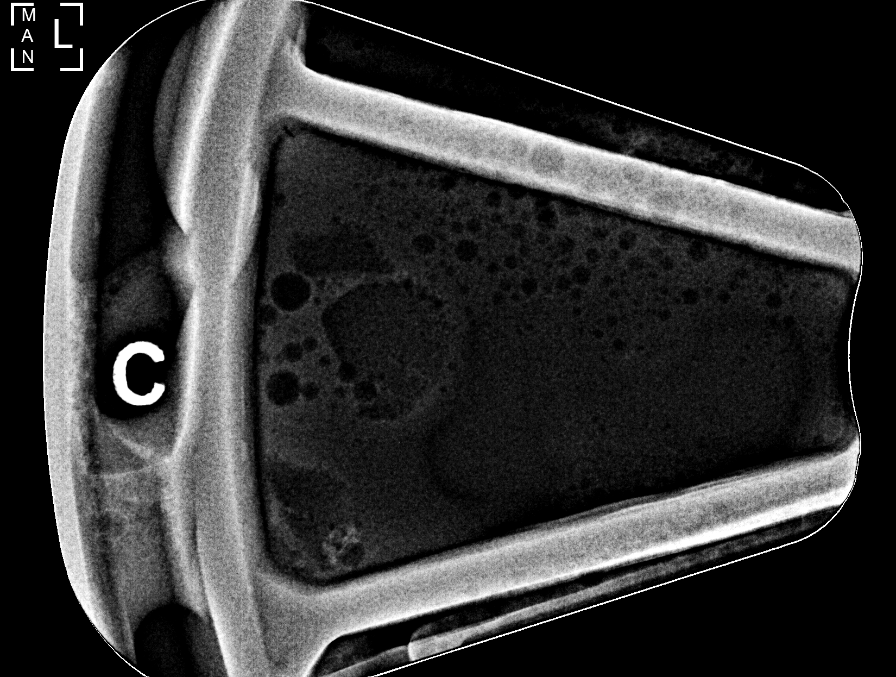

[L (6 of 8)]
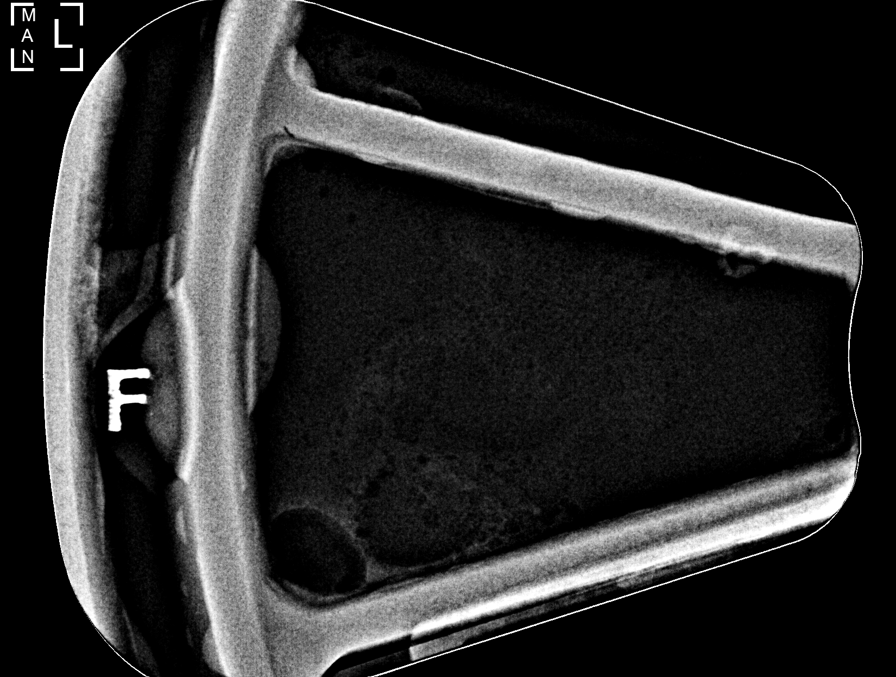

[L (7 of 8)]
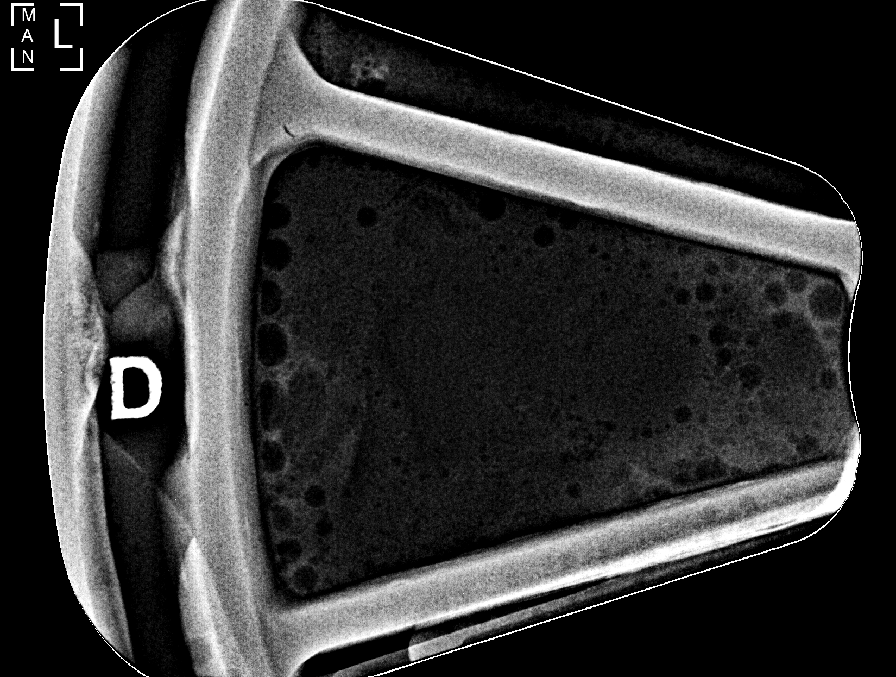

[L (8 of 8)]
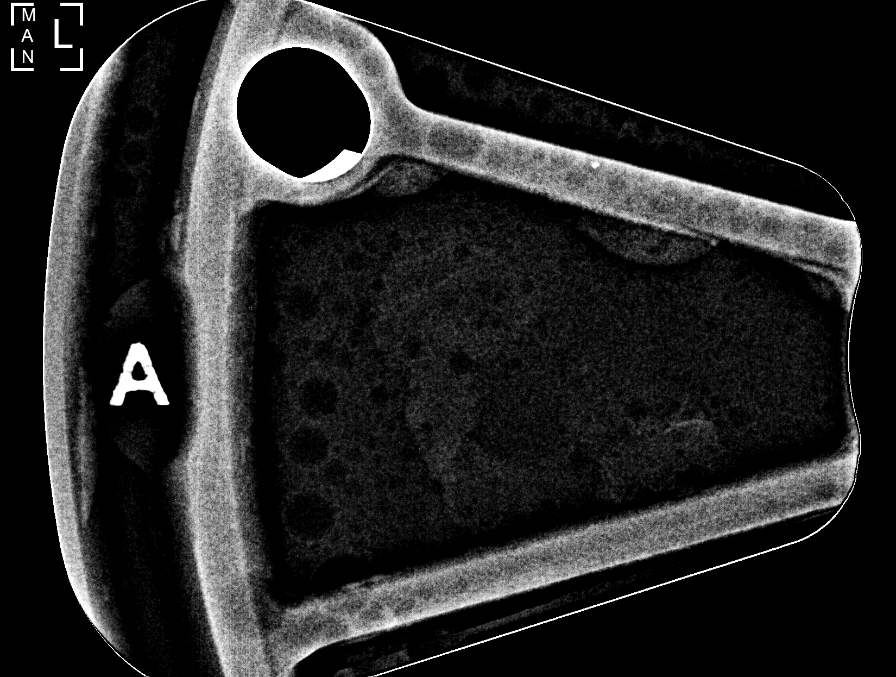

[8 of 28 positions shown; findings below may reference images not displayed]

FINDINGS: Prior to the procedure, additional 2D/3D images are performed of the
left breast which confirm presence of asymmetric density in the
lower inner quadrant of the left breast.

The patient and I discussed the procedure of stereotactic-guided
biopsy including benefits and alternatives. We discussed the high
likelihood of a successful procedure. We discussed the risks of the
procedure including infection, bleeding, tissue injury, clip
migration, and inadequate sampling. Informed written consent was
given. The usual time out protocol was performed immediately prior
to the procedure.

Using sterile technique and 1% Lidocaine as local anesthetic, under
stereotactic guidance, a 9 gauge vacuum assisted device was used to
perform core needle biopsy of mass in the lower inner quadrant of
the left breast using a cephalad approach.

Lesion quadrant: Lower inner quadrant of the left breast

At the conclusion of the procedure, an X shaped marker clip was
deployed into the biopsy cavity. Follow-up 2-view mammogram was
performed and dictated separately.
IMPRESSION: Stereotactic-guided biopsy of left breast mass. No apparent
complications.

## 2019-03-15 ENCOUNTER — Telehealth: Payer: Self-pay

## 2019-03-15 NOTE — Telephone Encounter (Signed)
Per Dr. Raliegh Scarlet called the patient and notified her that urine culture negative and there is no need for antibiotic use at this time. MPulliam, CMA/RT(R)

## 2019-04-28 ENCOUNTER — Other Ambulatory Visit: Payer: Self-pay | Admitting: Family Medicine

## 2019-06-13 ENCOUNTER — Telehealth: Payer: Self-pay | Admitting: Family Medicine

## 2019-06-13 NOTE — Telephone Encounter (Signed)
-----   Message from Jerilee Field, [place] sent at 05/01/2019 11:24 AM EDT ----- Patient is due for follow up, please call the patient to make an appointment.  Thanks. MPulliam, CMA/RT(R)

## 2019-06-13 NOTE — Telephone Encounter (Signed)
-----   Message from Jerilee Field, [place] sent at 05/01/2019 11:24 AM EDT ----- Patient is due for follow up, please call the patient to make an appointment.  Thanks. MPulliam, CMA/RT(R)

## 2019-06-13 NOTE — Telephone Encounter (Signed)
Called pt to set up provider required OV/Telehealth F/U pt declined states has Rx needed but did inquire if Labs were needed( do not see any order in chart) .  --Forwarding message to medical assistant as Juluis Rainier.  -glh

## 2019-08-15 ENCOUNTER — Ambulatory Visit: Payer: 59 | Admitting: Plastic Surgery

## 2019-09-08 ENCOUNTER — Ambulatory Visit (INDEPENDENT_AMBULATORY_CARE_PROVIDER_SITE_OTHER): Payer: 59 | Admitting: Plastic Surgery

## 2019-09-08 ENCOUNTER — Other Ambulatory Visit: Payer: Self-pay

## 2019-09-08 ENCOUNTER — Encounter: Payer: Self-pay | Admitting: Plastic Surgery

## 2019-09-08 VITALS — BP 150/94 | HR 90 | Temp 98.9°F | Ht 61.0 in | Wt 220.4 lb

## 2019-09-08 DIAGNOSIS — Z9889 Other specified postprocedural states: Secondary | ICD-10-CM | POA: Diagnosis not present

## 2019-09-08 DIAGNOSIS — Z9013 Acquired absence of bilateral breasts and nipples: Secondary | ICD-10-CM

## 2019-09-08 NOTE — Progress Notes (Signed)
   Subjective:    Patient ID: Cheryl [name], female    DOB: 1976/05/20, 43 y.o.   MRN: [id number]  The patient is a 43 yrs old wf here for follow up on her breast reconstruction.  She had bilateral breast reconstruction with a left-sided latissimus and right-sided implant.  She had some complications and in the and has done extremely well.  Today she is very concerned about some changes she is feeling in the left breast.  She has noticed some change in shape and she is worried.  Her husband was just diagnosed with stage IV: Disease so she is very emotional understandably.  On exam I do not feel anything of concern.  It seems like there is relaxing of the tissue.  Her symmetry is.  I do not feel any lumps or bumps.     Review of Systems  Constitutional: Negative.   HENT: Negative.   Eyes: Negative.   Respiratory: Negative.   Cardiovascular: Negative.   Gastrointestinal: Negative.  Negative for abdominal pain.  Endocrine: Negative.   Genitourinary: Negative.   Musculoskeletal: Negative.  Negative for back pain.  Hematological: Negative.   Psychiatric/Behavioral: Negative.        Objective:   Physical Exam Vitals signs and nursing note reviewed.  Constitutional:      Appearance: Normal appearance.  Eyes:     Extraocular Movements: Extraocular movements intact.  Cardiovascular:     Rate and Rhythm: Normal rate.     Pulses: Normal pulses.     Heart sounds: No murmur.  Pulmonary:     Effort: Pulmonary effort is normal.     Breath sounds: Normal breath sounds.  Abdominal:     General: Abdomen is flat. There is no distension.     Tenderness: There is no abdominal tenderness.  Skin:    General: Skin is warm.  Neurological:     General: No focal deficit present.     Mental Status: [name] alert and oriented to person, place, and time.  Psychiatric:        Mood and Affect: Mood normal.        Behavior: Behavior normal.        Thought Content: Thought content normal.        Assessment & Plan:     ICD-10-CM   1. S/P mastectomy, bilateral  Z90.13 US BREAST COMPLETE UNI LEFT INC AXILLA  2. S/P breast reconstruction  Z98.890 US BREAST COMPLETE UNI LEFT INC AXILLA  With her history I think it is important to go for an ultrasound.  In a depending on the results of the ultrasound we may need to do an MRI.  I would like to see her back after she has the ultrasound. Pictures were obtained of the patient and placed in the chart with the patient's or guardian's permission.

## 2019-09-12 ENCOUNTER — Other Ambulatory Visit: Payer: Self-pay | Admitting: Plastic Surgery

## 2019-09-12 DIAGNOSIS — N644 Mastodynia: Secondary | ICD-10-CM

## 2019-09-19 ENCOUNTER — Ambulatory Visit: Payer: 59 | Admitting: Plastic Surgery

## 2019-09-20 ENCOUNTER — Telehealth: Payer: Self-pay | Admitting: *Deleted

## 2019-09-20 ENCOUNTER — Ambulatory Visit
Admission: RE | Admit: 2019-09-20 | Discharge: 2019-09-20 | Disposition: A | Discharge status: Home / Self Care | Payer: 59 | Source: Ambulatory Visit | Attending: Plastic Surgery | Admitting: Plastic Surgery

## 2019-09-20 ENCOUNTER — Other Ambulatory Visit: Payer: Self-pay

## 2019-09-20 DIAGNOSIS — Z978 Presence of other specified devices: Secondary | ICD-10-CM

## 2019-09-20 DIAGNOSIS — Z9013 Acquired absence of bilateral breasts and nipples: Secondary | ICD-10-CM

## 2019-09-20 DIAGNOSIS — Z9889 Other specified postprocedural states: Secondary | ICD-10-CM

## 2019-09-20 DIAGNOSIS — N644 Mastodynia: Secondary | ICD-10-CM

## 2019-09-20 NOTE — Telephone Encounter (Signed)
-----   Message from Wallace Going, DO sent at 09/20/2019 12:33 PM EDT ----- Can you please let the patient know the Ultrasound looks good.  No ultrasound evidence implant rupture. No masses to suggest breast carcinoma.

## 2019-09-20 NOTE — Telephone Encounter (Signed)
Called and [facility] @ 12:57pm) asking the patient to return phone call.//AB/CMA

## 2019-09-25 NOTE — Telephone Encounter (Signed)
Pt returned call for Angie

## 2019-09-25 NOTE — Telephone Encounter (Signed)
Spoke with the patient on (09/20/19) and informed her of her recent Ultrasound results.  She verbalized understanding and agreed.  Patient asked if I would ask Dr. Marla Roe about her during a MRI, and she also wanted know what kind of implants was put in.  She stated that the doctor who looked at the Ultrasound said the implants look different, and he saw 2 different lines.  I informed the patient that most of the time Dr. Marla Roe uses silicone implants, but I will ask.  Informed the patient that I will give her a call back.  Patient verbalized understanding and agreed.//AB/CMA

## 2019-09-25 NOTE — Telephone Encounter (Signed)
Patient called back and stated that she would like to have the MRI done.  Informed her that I will let Dr. Marla Roe know and we will get it scheduled for her.  Patient verbalized understanding and agreed.//AB/CMA

## 2019-09-25 NOTE — Telephone Encounter (Signed)
Called the patient back on (09/22/19) and (LMOM @ 3:20pm) informing the patient that I spoke with Dr. Marla Roe and she stated she can have the MRI only if she wants to.  It's her decision.  And the implant was Silcone [place]or Smooth Round High Profile Xtra Gel [id number].  Informed the patient I can give her a call back on Monday.//AB/CMA

## 2019-09-28 ENCOUNTER — Encounter: Payer: Self-pay | Admitting: Oncology

## 2019-10-04 ENCOUNTER — Telehealth: Payer: Self-pay | Admitting: Plastic Surgery

## 2019-10-05 ENCOUNTER — Ambulatory Visit (HOSPITAL_COMMUNITY): Admission: RE | Admit: 2019-10-05 | Payer: 59 | Source: Ambulatory Visit

## 2019-10-09 ENCOUNTER — Ambulatory Visit (HOSPITAL_COMMUNITY): Payer: 59

## 2019-10-13 ENCOUNTER — Encounter (HOSPITAL_COMMUNITY): Payer: Self-pay

## 2019-10-13 ENCOUNTER — Other Ambulatory Visit: Payer: Self-pay

## 2019-10-13 ENCOUNTER — Ambulatory Visit (HOSPITAL_COMMUNITY)
Admission: RE | Admit: 2019-10-13 | Discharge: 2019-10-13 | Disposition: A | Discharge status: Home / Self Care | Payer: 59 | Source: Ambulatory Visit | Attending: Plastic Surgery | Admitting: Plastic Surgery

## 2019-10-13 DIAGNOSIS — Z978 Presence of other specified devices: Secondary | ICD-10-CM

## 2019-10-13 MED ORDER — GADOBUTROL 1 MMOL/ML IV SOLN: 10.0000 mL | Freq: Once | INTRAVENOUS | Status: DC | PRN

## 2019-10-20 ENCOUNTER — Ambulatory Visit (HOSPITAL_COMMUNITY): Admission: RE | Admit: 2019-10-20 | Payer: 59 | Source: Ambulatory Visit

## 2019-12-15 ENCOUNTER — Ambulatory Visit: Payer: 59 | Admitting: Plastic Surgery

## 2019-12-24 NOTE — Progress Notes (Signed)
[place]  [phone] [phone]     ID: Cheryl [name] DOB: 12-23-1975  MR#: [id number]  SWF#:[id number]  Patient Care Team: Mellody Dance, DO as PCP - General (Family Medicine) [name], MD as Consulting Physician (Obstetrics and Gynecology) [name], MD (Inactive) as Consulting Physician (General Surgery) Havannah Streat, Virgie Dad, MD as Consulting Physician (Oncology) [name], RN as Registered Nurse [name], RN as Registered Nurse Dillingham, [name], DO as Attending Physician (Plastic Surgery) OTHER MD:  CHIEF COMPLAINT: bilateral breast cancers, one estrogen receptor positive (s/p bilateral mastectomies)  CURRENT TREATMENT: Anastrozole   INTERVAL HISTORY:  Cheryl [name] returns today for a follow-up of her recurrent breast cancers  She continues on anastrozole.  She obtains this at a good price.  Vaginal dryness is not an issue and she is aware of our pelvic health program.  Hot flashes are minimal.  Since her last visit, she underwent left breast implant placement on 12/28/2018 under Dr. Marla Roe. Pathology from the procedure ([id number]) was negative for malignancy.  She presented for left breast ultrasound on 09/20/2019 for discomfort and change in contour of the left breast concerning for implant dislodgement or disruption. Physical exam showed a mild contour bulge along the lower-outer aspect of the breast, underlying implant intact, and no discrete palpable mass. Ultrasound showed: no evidence of implant rupture; implant may still be displaced rotated; no masses to suggest breast carcinoma. Breast MRI was recommended to further assess implant integrity and position.   REVIEW OF SYSTEMS: Cheryl [name] had a "bad cold" mid December and tested positive for COVID-19.  Her daughter Cheryl [name] also at home also was positive.  Her significant other Cheryl [name] however tested negative.  She temporarily lost her taste but has  regained that.  She and her daughter still have not regained their sense of smell.  Cheryl [name] is not walking these days because of weather issues.  They do not have equipment at home.  She has changed to a keto diet in January of this year and has already lost 6 pounds.  The diet is constipating her slightly.  Otherwise a detailed review of systems today was stable   HISTORY OF RECURRENT DISEASE: From my 03/18/2017 note:  Cheryl [name] returns today for follow-up of her triple negative breast cancer, which is now recurrent. She had routine bilateral diagnostic mammography at the [facility] 03/10/2017 and this showed the breast density to be category C. The old lumpectomy site, which was in the upper outer quadrant, appears stable. However there was a new area of asymmetry in the lower inner quadrant of the left breast. Ultrasound confirmed this to be a 1.0 cm mass at the 7:00 radiant 5 cm from the nipple. Aspiration was attempted but no fluid was aspirated and biopsy of this mass was obtained 03/05/2017. This showed ([id number]) invasive mammary carcinoma, grade 2, estrogen and progesterone receptor negative, HER-2 not amplified with a signals ratio 1.17 and a number per cell of 2.45, with an MIB-1 of 15%.  [name] here today to discuss subsequent management   BREAST CANCER HISTORY: From the original intake note:  The patient had screening mammography 02/01/2015 which showed a calcifications in the upper outer quadrant of the left breast. On 02/08/2015 she underwent left diagnostic mammography with tomosynthesis and left breast ultrasonography at the breast Center. The breast density was category C. Mammography confirmed a group of pleomorphic calcifications spanning 4.2 cm maximally. Physical examination of the upper outer quadrant of the  left breast showed an area of thickening at the approximate 2:00 position. Ultrasound of this area showed no discrete masses. There was vague shadowing  present. Calcifications could not be clearly identified sonographically. There was no lymphadenopathy in the left axilla.  On the same day the patient underwent biopsy of the left breast area in question, with the pathology (S [id number]) showing ductal carcinoma in situ, grade 2 or 3, with possible areas of microinvasion, the cancer cells being strongly estrogen and progesterone receptor positive, both at 100%.  On 02/15/2015 the patient underwent bilateral breast MRI. This showed the area of malignancy in the upper outer quadrant to measure 4.5 cm, including a large area of non-masslike enhancement and a 1 cm spiculated mass. There was also an indeterminate oval mass in the central left breast and another indeterminant mass in the slightly outer right breast anteriorly. Biopsy of the right breast mass 02/18/2015 ([id number]) showed a fibroadenoma, with no evidence of malignancy. Biopsy of 2 additional areas of the left breast (at 10:00 and 2:00) showed a fibroadenoma in the upper inner quadrant, and pseudo-angiomatous stromal hyperplasia (PA SH) at the 2:00 area of the left breast.  The patient's subsequent history is as detailed below.   PAST MEDICAL HISTORY: Past Medical History:  Diagnosis Date  . Breast cancer ([place]) 2016 and 2018   left breast, surgery and chemo done 2018  . Cellulitis 07/25/2018   left breast  . Pre-diabetes   . SVD (spontaneous vaginal delivery)    x 3  . Wears contact lenses     PAST SURGICAL HISTORY: Past Surgical History:  Procedure Laterality Date  . ABDOMINAL HYSTERECTOMY    . BILATERAL TOTAL MASTECTOMY WITH AXILLARY LYMPH NODE DISSECTION  04/20/2017  . BREAST BIOPSY Left 02/18/2015  . BREAST BIOPSY Right 02/18/2015  . BREAST EXCISIONAL BIOPSY Left 04/05/2015  . BREAST IMPLANT REMOVAL Left 09/15/2018   Procedure: REMOVAL BREAST IMPLANTS;  Surgeon: Wallace Going, DO;  Location: [place];  Service: Plastics;  Laterality: Left;  . BREAST LUMPECTOMY  Left 03/27/2015  . BREAST LUMPECTOMY WITH NEEDLE LOCALIZATION AND AXILLARY SENTINEL LYMPH NODE BX Left 03/27/2015   Procedure: LEFT BREAST LUMPECTOMY WITH BRACKETED NEEDLE LOCALIZATION AND LEFT  AXILLARY SENTINEL LYMPH NODE BX;  Surgeon: [name], MD;  Location: [place];  Service: General;  Laterality: Left;  . BREAST RECONSTRUCTION WITH PLACEMENT OF TISSUE EXPANDER AND FLEX HD (ACELLULAR HYDRATED DERMIS) Bilateral 04/20/2017   Procedure: BILATARAL BREAST RECONSTRUCTION WITH PLACEMENT OF TISSUE EXPANDER AND FLEX HD (ACELLULAR HYDRATED DERMIS);  Surgeon: Wallace Going, DO;  Location: [place];  Service: Plastics;  Laterality: Bilateral;  . BREAST REDUCTION SURGERY Right 12/28/2018   Procedure: Liposuction of medial right breast for improved symmetry;  Surgeon: Wallace Going, DO;  Location: [place];  Service: Plastics;  Laterality: Right;  . BREAST SURGERY Bilateral    masectomy  . CHOLECYSTECTOMY N/A 07/07/2017   Procedure: LAPAROSCOPIC CHOLECYSTECTOMY WITH INTRAOPERATIVE CHOLANGIOGRAM;  Surgeon: [name], MD;  Location: [facility];  Service: General;  Laterality: N/A;  . Insertion of Essure  yrs ago   For Birth control  . LAPAROSCOPIC VAGINAL HYSTERECTOMY WITH SALPINGO OOPHORECTOMY Bilateral 09/08/2018   Procedure: LAPAROSCOPIC ASSISTED VAGINAL HYSTERECTOMY WITH SALPINGO OOPHORECTOMY;  Surgeon: [name], MD;  Location: [place];  Service: Gynecology;  Laterality: Bilateral;  . LATISSIMUS FLAP TO BREAST Left 09/15/2018   Procedure: LATISSIMUS FLAP TO LEFT BREAST;  Surgeon: Wallace Going, DO;  Location: [place];  Service: Plastics;  Laterality: Left;  . LIPOSUCTION WITH LIPOFILLING Bilateral 05/20/2018   Procedure: Bilateral breast fat grafting from abdomen to improve symmetry following breast reconstruction;  Surgeon: Wallace Going, DO;  Location: [place];  Service: Plastics;  Laterality:  Bilateral;  . MASTECTOMY W/ SENTINEL NODE BIOPSY Bilateral 04/20/2017   Procedure: BILATERAL TOTAL MASTECTOMY WITH LEFT AXILLARY SENTINEL LYMPH NODE BIOPSY;  Surgeon: [name], MD;  Location: [place];  Service: General;  Laterality: Bilateral;  . PORTA CATH REMOVAL Right 11/04/2017   Procedure: PORTA CATH REMOVAL;  Surgeon: Wallace Going, DO;  Location: [place];  Service: Plastics;  Laterality: Right;  . PORTACATH PLACEMENT Right 04/25/2015   Procedure: INSERTION PORT-A-CATH;  Surgeon: [name], MD;  Location: [place];  Service: General;  Laterality: Right;, renoved in 2016  . PORTACATH PLACEMENT Right 04/20/2017   Procedure: INSERTION PORT-A-CATH;  Surgeon: [name], MD;  Location: [place];  Service: General;  Laterality: Right;  . RE-EXCISION OF BREAST LUMPECTOMY Left 04/05/2015   Procedure: RE-EXCISION LEFT  BREAST LUMPECTOMY;  Surgeon: [name], MD;  Location: [place];  Service: General;  Laterality: Left;  . REMOVAL OF TISSUE EXPANDER AND PLACEMENT OF IMPLANT Bilateral 11/04/2017   Procedure: BILATERAL REMOVAL OF TISSUE EXPANDER AND PLACEMENT OF  BILATERAL BREAST IMPLANT;  Surgeon: Wallace Going, DO;  Location: [place];  Service: Plastics;  Laterality: Bilateral;  . REMOVAL OF TISSUE EXPANDER AND PLACEMENT OF IMPLANT Left 12/28/2018   Procedure: Removal of left breast expander and placement of silicone implant.;  Surgeon: Wallace Going, DO;  Location: [place];  Service: Plastics;  Laterality: Left;  . TISSUE EXPANDER PLACEMENT Left 09/15/2018   Procedure: PLACEMENT OF TISSUE EXPANDER;  Surgeon: Wallace Going, DO;  Location: [place];  Service: Plastics;  Laterality: Left;    FAMILY HISTORY Family History  Problem Relation Age of Onset  . Lung cancer Maternal Grandmother 97       non smoker  . Lung cancer Maternal Grandfather 19       non smoker  worked in [employer]  . Cancer Paternal Grandfather 35       throat cancer ? smoker  . Cancer Cousin 41       throat cancer ? smoker   the patient's parents are living, in their mid-50s as of March 2016. The patient had no siblings. Both the patient's mother's parents died from lung cancer although they did not smoke. [place] and her maternal grandfather was a Ecologist. There is no history of breast or ovarian cancer in the family to her knowledge   GYNECOLOGIC HISTORY:  Patient's last menstrual period was 04/02/2015 (approximate). Menarche age 55, first live birth age 53. The patient is GX P3. [name] status post hysterectomy with bilateral salpingo-oophorectomy 09/08/2018.   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 Her first husband [name] is a Freight forwarder.  They divorced in 2016.  Their daughter Cheryl [name] lives in [place] where she works in Press photographer. Daughter Cheryl [name] is a Research scientist (physical sciences).  Daughter Journalist, newspaper lives at home and is 45 years old as of JAN 2021.  Also at home is the patient's significant other, Cheryl [name], who does have children from a prior marriage but they are all grown. Cheryl [name] is followed by Dr Marin Olp for stage IV colon CA. The  patient also has a 49 year old grandson "who has a 46 month old sister."    ADVANCED DIRECTIVES: Not in place   HEALTH MAINTENANCE: Social History   Tobacco Use  . Smoking status: Never Smoker  . Smokeless tobacco: Never Used  Substance Use Topics  . Alcohol use: Yes    Alcohol/week: 0.0 [name] drinks    Comment: socially twice month  . Drug use: No     Colonoscopy:  PAP:  Bone density: 03/21/2018, T score of -1.4  Lipid panel:  Allergies  Allergen Reactions  . Adhesive [Tape] Other (See Comments)    Band Aids causes Skin blisters.  All other tape is okay to use    Current Outpatient Medications  Medication Sig Dispense Refill  . anastrozole (ARIMIDEX)  1 MG tablet Take 1 tablet (1 mg total) by mouth daily. 90 tablet 4  . b complex vitamins tablet Take 1 tablet by mouth daily.    Marland Kitchen ibuprofen (ADVIL,MOTRIN) 600 MG tablet Take 1 tablet (600 mg total) by mouth every 6 (six) hours as needed (mild pain). 30 tablet 0  . Vitamin D, Ergocalciferol, (DRISDOL) 1.25 MG (50000 UT) CAPS capsule TAKE 1 CAPSULE (50,000 UNITS TOTAL) BY MOUTH EVERY 7 (SEVEN) DAYS. 12 capsule 10   No current facility-administered medications for this visit.    OBJECTIVE: Morbidly obese white woman in no acute distress  Vitals:   12/25/19 0855  BP: 127/73  Pulse: 77  Resp: 18  Temp: 98.7 F (37.1 C)  SpO2: 99%     Body mass index is 42 kg/m.    ECOG FS:1 - Symptomatic but completely ambulatory   Sclerae unicteric, EOMs intact Wearing a mask No cervical or supraclavicular adenopathy Lungs no rales or rhonchi Heart regular rate and rhythm Abd soft, nontender, positive bowel sounds MSK no focal spinal tenderness, no upper extremity lymphedema Neuro: nonfocal, well oriented, appropriate affect Breasts: The right breast is status post mastectomy with implant placement.  The left breast is status post mastectomy and radiation with latissimus and implant reconstruction.  The scaly area of proud flesh noted in prior photos has resolved.  There is no evidence of local recurrence.  Both axillae are benign.  Left breast 12/22/2018     LAB RESULTS:  CMP     Component Value Date/Time   NA 140 12/22/2018 0834   NA 138 04/26/2018 1221   NA 140 10/21/2017 1206   K 4.1 12/22/2018 0834   K 4.0 10/21/2017 1206   CL 104 12/22/2018 0834   CO2 23 12/22/2018 0834   CO2 24 10/21/2017 1206   GLUCOSE 145 (H) 12/22/2018 0834   GLUCOSE 98 10/21/2017 1206   BUN 10 12/22/2018 0834   BUN 10 04/26/2018 1221   BUN 6.5 (L) 10/21/2017 1206   CREATININE 0.87 12/22/2018 0834   CREATININE 0.9 10/21/2017 1206   CALCIUM 9.6 12/22/2018 0834   CALCIUM 9.8 10/21/2017 1206   PROT 7.7  12/22/2018 0834   PROT 7.3 04/26/2018 1221   PROT 7.5 10/21/2017 1206   ALBUMIN 3.9 12/22/2018 0834   ALBUMIN 4.8 04/26/2018 1221   ALBUMIN 3.9 10/21/2017 1206   AST 16 12/22/2018 0834   AST 32 10/21/2017 1206   ALT 21 12/22/2018 0834   ALT 37 10/21/2017 1206   ALKPHOS 113 12/22/2018 0834   ALKPHOS 105 10/21/2017 1206   BILITOT 0.8 12/22/2018 0834   BILITOT 0.64 10/21/2017 1206   GFRNONAA >60 12/22/2018 0834   GFRAA >60 12/22/2018 7829  INo results found for: SPEP, UPEP  Lab Results  Component Value Date   WBC 6.5 12/25/2019   NEUTROABS 3.6 12/25/2019   HGB 12.6 12/25/2019   HCT 39.6 12/25/2019   MCV 91.7 12/25/2019   PLT 322 12/25/2019      Chemistry      Component Value Date/Time   NA 140 12/22/2018 0834   NA 138 04/26/2018 1221   NA 140 10/21/2017 1206   K 4.1 12/22/2018 0834   K 4.0 10/21/2017 1206   CL 104 12/22/2018 0834   CO2 23 12/22/2018 0834   CO2 24 10/21/2017 1206   BUN 10 12/22/2018 0834   BUN 10 04/26/2018 1221   BUN 6.5 (L) 10/21/2017 1206   CREATININE 0.87 12/22/2018 0834   CREATININE 0.9 10/21/2017 1206      Component Value Date/Time   CALCIUM 9.6 12/22/2018 0834   CALCIUM 9.8 10/21/2017 1206   ALKPHOS 113 12/22/2018 0834   ALKPHOS 105 10/21/2017 1206   AST 16 12/22/2018 0834   AST 32 10/21/2017 1206   ALT 21 12/22/2018 0834   ALT 37 10/21/2017 1206   BILITOT 0.8 12/22/2018 0834   BILITOT 0.64 10/21/2017 1206       No results found for: LABCA2  No components found for: LABCA125  No results for input(s): INR in the last 168 hours.  Urinalysis    Component Value Date/Time   COLORURINE YELLOW 08/08/2018 1407   APPEARANCEUR CLEAR 08/08/2018 1407   LABSPEC 1.013 08/08/2018 1407   LABSPEC 1.020 05/30/2015 1502   PHURINE 6.0 08/08/2018 1407   GLUCOSEU NEGATIVE 08/08/2018 1407   GLUCOSEU Negative 05/30/2015 1502   HGBUR SMALL (A) 08/08/2018 1407   BILIRUBINUR negative 03/09/2019 0923   BILIRUBINUR Negative 05/30/2015 1502    KETONESUR NEGATIVE 08/08/2018 1407   PROTEINUR Negative 03/09/2019 0923   PROTEINUR NEGATIVE 08/08/2018 1407   UROBILINOGEN 0.2 03/09/2019 0923   UROBILINOGEN 0.2 05/30/2015 1502   NITRITE negative 03/09/2019 0923   NITRITE NEGATIVE 08/08/2018 1407   LEUKOCYTESUR Small (1+) (A) 03/09/2019 0923   LEUKOCYTESUR Negative 05/30/2015 1502    STUDIES: No results found.    ASSESSMENT: 44 y.o. [place], [place] woman status post left breast upper outer quadrant biopsy 02/08/2015 for ductal carcinoma in situ, grade 2 or 3, strongly estrogen and progesterone receptor positive, with likely areas of microinvasion  (a) biopsy of an area in the left breast upper outer quadrant showed PASH  (b) biopsy of 2 additional questionable areas, one in each breast, showed bilateral fibroadenomas  (1) genetics testing March 2016 through the BreastNext gene panel offered by [employer] showed no deleterious mutations in ATM, BARD1, BRCA1, BRCA2, BRIP1, CDH1, CHEK2, MRE11A, MUTYH, NBN, NF1, PALB2, PTEN, RAD50, RAD51C, RAD51D, and TP53.  (2) status post left lumpectomy with sentinel lymph node sampling 03/27/2015 for a pT1c pN0, stage IA invasive ductal carcinoma, grade 3, triple negative, with an MIB-1 of 33%  (a) close margins were cleared with subsequent excision 04/05/2015.  (3) Oncotype DX score of 38 predicts a risk of 26% outside the breast recurrence within 10 years if the patient's only systemic treatment is tamoxifen for 5 years  (4) adjuvant chemotherapy started 05/02/2015 consisting of doxorubicin and cyclophosphamide in dose dense fashion 4, completed 06/13/2015, followed by paclitaxel weekly 12.   (a) Paclitaxel stopped after only 2 cycles because of persistent neuropathy.  (5) adjuvant radiation 08/19/15-10/02/15 Site/dose:   Left breast/ 45 Gy at 1.8 Gy per fraction x 25 fractions.  Left breast  boost/ 16 Gy at 2 Gy per fraction x 8 fractions  (5) tamoxifen started 02/20/2015 (neoadjuvantly),  discontinued 04/19/2015 so as not to overlap chemotherapy; resumed 02/07/2016, discontinued April 2018 with disease recurrence  RECURRENT DISEASE: April 2018 (6) left breast lower outer quadrant biopsy 03/05/2017 shows invasive ductal carcinoma, grade 2, triple negative  (7) status post bilateral mastectomies and left axillary lymph node dissection 04/20/2017 showing a residual left pT2 pN0 (8 nodes removed) invasive ductal carcinoma, grade 3, with negative margins; the right breast was benign  (a) she underwent expander exchange for bilateral gel implants with capsulectomies 11/04/2017  (b) status post implant extrusion on the left, with latissimus reconstruction 09/15/2018  (c) definitive implant and wound revision Left 12/28/2018 with a [employer]or Smooth Round High Profile Xtra Gel 380cc. Ref #[id number].  Serial Number [id number]  (8) chemotherapy for her recurrence consisting of cyclophosphamide, methotrexate and fluorouracil (CMF) given every 21 days 8, started 05/13/2017  (a) eighth cycle omitted because of intercurrent plastic surgery and complications  (9) status post cholecystectomy 07/07/2017, with benign pathology  (10) anastrozole started 12/27/2017  (a) bone density scan 03/21/2018 shows a T score of -1.4  (11) status post hysterectomy with bilateral salpingo-oophorectomy 09/08/2018, with benign pathology   PLAN Cheryl [name] is now 2-1/2 years out from definitive surgery for her breast cancer with no evidence of disease recurrence.  This is very favorable.  [name] tolerating anastrozole well and the plan is to continue that a minimum of 5 years.  We have discussed the vaginal dryness issue which is not a major problem for her and she is not interested in pursuing our pelvic health program.  However she never did undergo physical therapy and she has some limitation in the use of the left upper extremity.  I have put in physical therapy referral for her.  Her bone density April 2019  showed osteopenia.  I am going to repeat that later this year.  I have encouraged her to continue to take vitamin D and to walk on a regular basis  Hopefully she will recover her sense of smell.  I encouraged her diet which seems to be working for her  Otherwise she will return to see me in 1 year.  She knows to call for any other issue that may develop before the next visit.  Total encounter time 30 minutes.*  Wynetta Seith, Virgie Dad, MD  12/25/19 9:13 AM Medical Oncology and Hematology [facility] [address] [place], [place] [address] Tel. [phone]    Fax. [phone]   I, Wilburn Mylar, am acting as scribe for Dr. Virgie Dad. Laporshia Hogen.  I, Lurline Del MD, have reviewed the above documentation for accuracy and completeness, and I agree with the above.   *Total Encounter Time as defined by the Centers for Medicare and Medicaid Services includes, in addition to the face-to-face time of a patient visit (documented in the note above) non-face-to-face time: obtaining and reviewing outside history, ordering and reviewing medications, tests or procedures, care coordination (communications with other health care professionals or caregivers) and documentation in the medical record.

## 2019-12-25 ENCOUNTER — Inpatient Hospital Stay: Payer: 59

## 2019-12-25 ENCOUNTER — Other Ambulatory Visit: Payer: Self-pay

## 2019-12-25 ENCOUNTER — Inpatient Hospital Stay: Payer: 59 | Attending: Oncology | Admitting: Oncology

## 2019-12-25 VITALS — BP 127/73 | HR 77 | Temp 98.7°F | Resp 18 | Ht 61.0 in | Wt 222.3 lb

## 2019-12-25 DIAGNOSIS — Z9013 Acquired absence of bilateral breasts and nipples: Secondary | ICD-10-CM | POA: Diagnosis not present

## 2019-12-25 DIAGNOSIS — M8580 Other specified disorders of bone density and structure, unspecified site: Secondary | ICD-10-CM

## 2019-12-25 DIAGNOSIS — C50912 Malignant neoplasm of unspecified site of left female breast: Secondary | ICD-10-CM | POA: Diagnosis not present

## 2019-12-25 DIAGNOSIS — Z79811 Long term (current) use of aromatase inhibitors: Secondary | ICD-10-CM | POA: Diagnosis not present

## 2019-12-25 DIAGNOSIS — Z79899 Other long term (current) drug therapy: Secondary | ICD-10-CM | POA: Insufficient documentation

## 2019-12-25 DIAGNOSIS — Z90710 Acquired absence of both cervix and uterus: Secondary | ICD-10-CM

## 2019-12-25 DIAGNOSIS — T451X5A Adverse effect of antineoplastic and immunosuppressive drugs, initial encounter: Secondary | ICD-10-CM

## 2019-12-25 DIAGNOSIS — E559 Vitamin D deficiency, unspecified: Secondary | ICD-10-CM

## 2019-12-25 DIAGNOSIS — Z9221 Personal history of antineoplastic chemotherapy: Secondary | ICD-10-CM | POA: Insufficient documentation

## 2019-12-25 DIAGNOSIS — Z9889 Other specified postprocedural states: Secondary | ICD-10-CM

## 2019-12-25 DIAGNOSIS — Z171 Estrogen receptor negative status [ER-]: Secondary | ICD-10-CM

## 2019-12-25 DIAGNOSIS — Z9079 Acquired absence of other genital organ(s): Secondary | ICD-10-CM | POA: Insufficient documentation

## 2019-12-25 DIAGNOSIS — R7302 Impaired glucose tolerance (oral): Secondary | ICD-10-CM

## 2019-12-25 DIAGNOSIS — Z170 Estrogen receptor positive status [ER+]: Secondary | ICD-10-CM | POA: Diagnosis not present

## 2019-12-25 DIAGNOSIS — Z923 Personal history of irradiation: Secondary | ICD-10-CM | POA: Diagnosis not present

## 2019-12-25 DIAGNOSIS — C50412 Malignant neoplasm of upper-outer quadrant of left female breast: Secondary | ICD-10-CM

## 2019-12-25 DIAGNOSIS — Z8616 Personal history of COVID-19: Secondary | ICD-10-CM | POA: Diagnosis not present

## 2019-12-25 DIAGNOSIS — K7689 Other specified diseases of liver: Secondary | ICD-10-CM

## 2019-12-25 DIAGNOSIS — Z90722 Acquired absence of ovaries, bilateral: Secondary | ICD-10-CM | POA: Insufficient documentation

## 2019-12-25 DIAGNOSIS — Z801 Family history of malignant neoplasm of trachea, bronchus and lung: Secondary | ICD-10-CM | POA: Diagnosis not present

## 2019-12-25 DIAGNOSIS — G620 Drug-induced polyneuropathy: Secondary | ICD-10-CM

## 2019-12-25 DIAGNOSIS — E7800 Pure hypercholesterolemia, unspecified: Secondary | ICD-10-CM

## 2019-12-25 HISTORY — DX: Other specified disorders of bone density and structure, unspecified site: M85.80

## 2019-12-25 LAB — CBC WITH DIFFERENTIAL/PLATELET
Abs Immature Granulocytes: 0.01 10*3/uL (ref 0.00–0.07)
Basophils Absolute: 0 10*3/uL (ref 0.0–0.1)
Basophils Relative: 1 %
Eosinophils Absolute: 0.2 10*3/uL (ref 0.0–0.5)
Eosinophils Relative: 3 %
HCT: 39.6 % (ref 36.0–46.0)
Hemoglobin: 12.6 g/dL (ref 12.0–15.0)
Immature Granulocytes: 0 %
Lymphocytes Relative: 35 %
Lymphs Abs: 2.3 10*3/uL (ref 0.7–4.0)
MCH: 29.2 pg (ref 26.0–34.0)
MCHC: 31.8 g/dL (ref 30.0–36.0)
MCV: 91.7 fL (ref 80.0–100.0)
Monocytes Absolute: 0.4 10*3/uL (ref 0.1–1.0)
Monocytes Relative: 6 %
Neutro Abs: 3.6 10*3/uL (ref 1.7–7.7)
Neutrophils Relative %: 55 %
Platelets: 322 10*3/uL (ref 150–400)
RBC: 4.32 MIL/uL (ref 3.87–5.11)
RDW: 13.6 % (ref 11.5–15.5)
WBC: 6.5 10*3/uL (ref 4.0–10.5)
nRBC: 0 % (ref 0.0–0.2)

## 2019-12-25 LAB — COMPREHENSIVE METABOLIC PANEL
ALT: 25 U/L (ref 0–44)
AST: 18 U/L (ref 15–41)
Albumin: 4.1 g/dL (ref 3.5–5.0)
Alkaline Phosphatase: 91 U/L (ref 38–126)
Anion gap: 10 (ref 5–15)
BUN: 14 mg/dL (ref 6–20)
CO2: 26 mmol/L (ref 22–32)
Calcium: 9.1 mg/dL (ref 8.9–10.3)
Chloride: 104 mmol/L (ref 98–111)
Creatinine, Ser: 0.81 mg/dL (ref 0.44–1.00)
GFR calc Af Amer: >60 mL/min (ref >60)
GFR calc non Af Amer: >60 mL/min (ref >60)
Glucose, Bld: 126 mg/dL — ABNORMAL HIGH (ref 70–99)
Potassium: 4.2 mmol/L (ref 3.5–5.1)
Sodium: 140 mmol/L (ref 135–145)
Total Bilirubin: 0.7 mg/dL (ref 0.3–1.2)
Total Protein: 7.3 g/dL (ref 6.5–8.1)

## 2019-12-25 MED ORDER — ANASTROZOLE 1 MG PO TABS: 1.0000 mg | ORAL_TABLET | Freq: Every day | ORAL | 4 refills | Status: DC

## 2019-12-26 ENCOUNTER — Telehealth: Payer: Self-pay | Admitting: Oncology

## 2019-12-26 NOTE — Telephone Encounter (Signed)
I talk with patient regarding 12/26/2020

## 2019-12-28 ENCOUNTER — Ambulatory Visit: Payer: 59 | Admitting: Physical Therapy

## 2019-12-29 ENCOUNTER — Ambulatory Visit: Payer: 59 | Admitting: Physical Therapy

## 2020-01-19 ENCOUNTER — Telehealth: Payer: Self-pay | Admitting: Plastic Surgery

## 2020-01-19 NOTE — Telephone Encounter (Signed)
Called Ms. [place] to schedule NAC tattoo. Pt coming in to see Dr. Marla Roe soon and she had a few questions for her and will schedule tattoo at that time.

## 2020-01-24 ENCOUNTER — Ambulatory Visit
Admission: EM | Admit: 2020-01-24 | Discharge: 2020-01-24 | Disposition: A | Discharge status: Home / Self Care | Payer: 59 | Attending: Emergency Medicine | Admitting: Emergency Medicine

## 2020-01-24 ENCOUNTER — Ambulatory Visit (INDEPENDENT_AMBULATORY_CARE_PROVIDER_SITE_OTHER)
Admission: RE | Admit: 2020-01-24 | Discharge: 2020-01-24 | Disposition: A | Discharge status: Other Acute Inpatient Hospital | Payer: 59 | Source: Ambulatory Visit

## 2020-01-24 DIAGNOSIS — N3000 Acute cystitis without hematuria: Secondary | ICD-10-CM

## 2020-01-24 DIAGNOSIS — Z9221 Personal history of antineoplastic chemotherapy: Secondary | ICD-10-CM | POA: Diagnosis not present

## 2020-01-24 DIAGNOSIS — R1030 Lower abdominal pain, unspecified: Secondary | ICD-10-CM | POA: Insufficient documentation

## 2020-01-24 DIAGNOSIS — Z90710 Acquired absence of both cervix and uterus: Secondary | ICD-10-CM | POA: Insufficient documentation

## 2020-01-24 DIAGNOSIS — R350 Frequency of micturition: Secondary | ICD-10-CM | POA: Insufficient documentation

## 2020-01-24 DIAGNOSIS — Z853 Personal history of malignant neoplasm of breast: Secondary | ICD-10-CM | POA: Diagnosis not present

## 2020-01-24 DIAGNOSIS — B9689 Other specified bacterial agents as the cause of diseases classified elsewhere: Secondary | ICD-10-CM | POA: Diagnosis not present

## 2020-01-24 LAB — POCT URINALYSIS DIP (MANUAL ENTRY)
Bilirubin, UA: NEGATIVE
Blood, UA: NEGATIVE
Glucose, UA: NEGATIVE mg/dL
Ketones, POC UA: NEGATIVE mg/dL
Nitrite, UA: POSITIVE — AB
Protein Ur, POC: NEGATIVE mg/dL
Spec Grav, UA: 1.025 (ref 1.010–1.025)
Urobilinogen, UA: 0.2 E.U./dL
pH, UA: 6.5 (ref 5.0–8.0)

## 2020-01-24 MED ORDER — CEPHALEXIN 500 MG PO CAPS: 500.0000 mg | ORAL_CAPSULE | Freq: Two times a day (BID) | ORAL | 0 refills | Status: AC

## 2020-01-24 NOTE — ED Provider Notes (Signed)
Virtual Visit via Video Note:  Cheryl [name]  initiated request for Telemedicine visit with [facility] team. I connected with Cheryl [name]  on 01/24/2020 at 11:45 AM  for a synchronized telemedicine visit using a video enabled HIPPA compliant telemedicine application. I verified that I am speaking with Cheryl [name]  using two identifiers. Tanzania Hall-Potvin, PA-C  was physically located in a [facility] site and Cheryl [name] was located at a different location.   The limitations of evaluation and management by telemedicine as well as the availability of in-person appointments were discussed. Patient was informed that she  may incur a bill ( including co-pay) for this virtual visit encounter. Cheryl [name]  expressed understanding and gave verbal consent to proceed with virtual visit.     History of Present Illness:Cheryl [name]  is a 44 y.o. female presents with concern for UTI.  [name] she has had low abdominal cramping and urinary frequency since Sunday.  Patient denies frequent UTIs, though [name] this feels similar to previous.  Denies vaginal discharge, anogenital lesions, urinary urgency or dysuria.  [name] she "never gets burning during UTIs ".  Patient is status post hysterectomy: Denies bleeding.  No back pain, belly pain, fever.  Denies history of renal calculi or pyelonephritis.   ROS as per HPI  Past Medical History:  Diagnosis Date  . Breast cancer ([place]) 2016 and 2018   left breast, surgery and chemo done 2018  . Cellulitis 07/25/2018   left breast  . Pre-diabetes   . SVD (spontaneous vaginal delivery)    x 3  . Wears contact lenses     Allergies  Allergen Reactions  . Adhesive [Tape] Other (See Comments)    Band Aids causes Skin blisters.  All other tape is okay to use        Observations/Objective: 44 y.o. female sitting in no acute distress.  Patient is able to speak in full sentences without coughing,  sneezing, wheezing.  Assessment and Plan: Patient concern for UTI, though history is fairly benign.  Patient does have history of prediabetes, though denies polyuria, polyphasia, nausea, vomiting.  Recommended patient come drop off urine specimen for testing prior to receiving antibiotics.  Patient is agreeable to this.  Return precautions discussed, patient verbalized understanding and is agreeable to plan.  Follow Up Instructions: Patient to present to [facility] for urine specimen drop off.  Will treat if indicated.   Patient's urine dipstick showing positive nitrates and leukocytes - culture pending.  Will start keflex today: denies post-antibiotic candida.   I discussed the assessment and treatment plan with the patient. The patient was provided an opportunity to ask questions and all were answered. The patient agreed with the plan and demonstrated an understanding of the instructions.   The patient was advised to call back or seek an in-person evaluation if the symptoms worsen or if the condition fails to improve as anticipated.  I provided 15 minutes of non-face-to-face time during this encounter.    [place], PA-C  01/24/2020 11:45 AM        Hall-Potvin, [place], PA-C 01/24/20 1148

## 2020-01-24 NOTE — Discharge Instructions (Addendum)
Take antibiotic as directed. Return for worsening urinary symptoms, blood in urine, belly/back pain, fever.

## 2020-01-24 NOTE — ED Triage Notes (Signed)
Pt had virtual visit this am, here to give urine sample.

## 2020-01-25 LAB — URINE CULTURE: Culture: NO GROWTH

## 2020-01-26 ENCOUNTER — Ambulatory Visit: Payer: 59 | Admitting: Plastic Surgery

## 2020-02-09 ENCOUNTER — Ambulatory Visit (INDEPENDENT_AMBULATORY_CARE_PROVIDER_SITE_OTHER): Payer: 59 | Admitting: Plastic Surgery

## 2020-02-09 ENCOUNTER — Other Ambulatory Visit: Payer: Self-pay

## 2020-02-09 ENCOUNTER — Encounter: Payer: Self-pay | Admitting: Plastic Surgery

## 2020-02-09 VITALS — BP 126/87 | HR 101 | Temp 97.8°F | Ht 61.0 in | Wt 217.4 lb

## 2020-02-09 DIAGNOSIS — Z9012 Acquired absence of left breast and nipple: Secondary | ICD-10-CM | POA: Diagnosis not present

## 2020-02-09 DIAGNOSIS — Z9013 Acquired absence of bilateral breasts and nipples: Secondary | ICD-10-CM

## 2020-02-09 DIAGNOSIS — Z9889 Other specified postprocedural states: Secondary | ICD-10-CM

## 2020-02-09 NOTE — Progress Notes (Signed)
   Subjective:    Patient ID: Cheryl [name], female    DOB: 08/12/1976, 44 y.o.   MRN: [id number]  Patient is a 44 year old female here for further consultation and follow-up on her bilateral breast reconstruction.  Overall she is doing well.  [name] working on weight reduction.  [name] also been dealing with her husband and his stage IV colon cancer.  [name] interested and possible revision surgery.  There is a little bit of asymmetry.  Enclosed she looks really good.  There is no sign of infection.  All incisions are healing very nicely.  There is no sign of seroma or hematoma.  The patient is 5 feet 1 inch tall and weight is 217 pounds.  She has not had nipple areolar tattoo reconstruction yet.  [name] interested in having this done.    Review of Systems  Constitutional: Negative.  Negative for activity change and appetite change.  HENT: Negative.   Eyes: Negative.   Respiratory: Negative.  Negative for chest tightness and shortness of breath.   Cardiovascular: Negative for leg swelling.  Gastrointestinal: Negative for abdominal distention and abdominal pain.  Endocrine: Negative.   Genitourinary: Negative.        Objective:   Physical Exam Vitals and nursing note reviewed.  Constitutional:      Appearance: Normal appearance.  HENT:     Head: Normocephalic and atraumatic.  Cardiovascular:     Rate and Rhythm: Normal rate.  Pulmonary:     Effort: Pulmonary effort is normal.  Abdominal:     General: Abdomen is flat. There is no distension.  Skin:    Capillary Refill: Capillary refill takes less than 2 seconds.  Neurological:     General: No focal deficit present.     Mental Status: [name] alert and oriented to person, place, and time.  Psychiatric:        Mood and Affect: Mood normal.        Behavior: Behavior normal.        Assessment & Plan:     ICD-10-CM   1. Acquired absence of left breast  Z90.12   2. Acquired absence of bilateral breasts and nipples  Z90.13    3. S/P mastectomy, bilateral  Z90.13   4. S/P breast reconstruction  Z98.890     The patient is interested in revision surgery.  There are a few things we can do to improve symmetry.  I am very happy for her and how well she is doing.  She will try to decrease her weight some more and will be back in touch with [place] in the next 3 months.  Pictures were obtained of the patient and placed in the chart with the patient's or guardian's permission.

## 2020-02-12 ENCOUNTER — Encounter: Payer: Self-pay | Admitting: Plastic Surgery

## 2020-03-11 ENCOUNTER — Telehealth: Payer: Self-pay | Admitting: Plastic Surgery

## 2020-03-11 NOTE — Telephone Encounter (Signed)
Patient called in to request prescription to be called in. [name] supposed to be having another surgery but was trying to lose a little weight before. Dr. Marla Roe told her about a prescription she could call in to help jump start weight loss and she is interested in that because she needs a little help. Please call patient to advise when medication has been called in.

## 2020-03-12 NOTE — Telephone Encounter (Signed)
Returned patients call. LMVM that Dr. Marla Roe does not prescribe medication for weight loss.

## 2020-04-04 ENCOUNTER — Ambulatory Visit: Payer: 59 | Admitting: Physician Assistant

## 2020-06-14 ENCOUNTER — Encounter: Payer: Self-pay | Admitting: Plastic Surgery

## 2020-06-14 ENCOUNTER — Other Ambulatory Visit: Payer: Self-pay

## 2020-06-14 ENCOUNTER — Ambulatory Visit (INDEPENDENT_AMBULATORY_CARE_PROVIDER_SITE_OTHER): Payer: 59 | Admitting: Plastic Surgery

## 2020-06-14 VITALS — BP 134/91 | HR 88 | Temp 98.1°F

## 2020-06-14 DIAGNOSIS — Z9013 Acquired absence of bilateral breasts and nipples: Secondary | ICD-10-CM

## 2020-06-14 DIAGNOSIS — N651 Disproportion of reconstructed breast: Secondary | ICD-10-CM | POA: Insufficient documentation

## 2020-06-14 DIAGNOSIS — Z9012 Acquired absence of left breast and nipple: Secondary | ICD-10-CM | POA: Diagnosis not present

## 2020-06-14 NOTE — Progress Notes (Signed)
Subjective:    Patient ID: Cheryl [name], female    DOB: [birth year], 44 y.o.   MRN: [id number]  A 44 year old female here for follow-up on her breast reconstruction.  She had left-sided breast cancer several years ago.  She had radiation and a lumpectomy.  She then had recurrence she decided on bilateral mastectomies.  She had a challenge with a seroma of the left breast.  This led to the need for a latissimus muscle flap.  She has a Product manager ultra high profile 590 cc implant in the right breast and a Mentor high profile Xtra gel 380 cc implant in the left breast.  She underwent fat grafting to the breasts.  This helped a little bit with symmetry.  [name] 205 pounds with a successful 10 pound weight loss over the past couple of months.  She has been trying very hard to decrease her weight.  [name] interested in improving the symmetry of her breasts.  On exam I do not feel any hematomas or seromas.  Her incisions are all well-healed.  She has some laxity of the left muscle flap can occur with time.  This has created some breast asymmetry and irregularity in the contour of the left breast.  Do not feel any lumps or bumps or capsular contracture.  The patient is wondering if she could go with larger implants.     Review of Systems  Constitutional: Negative for activity change and appetite change.  Eyes: Negative.   Respiratory: Negative for chest tightness and shortness of breath.   Cardiovascular: Negative for leg swelling.  Gastrointestinal: Negative for abdominal distention.  Endocrine: Negative.   Genitourinary: Negative.   Neurological: Negative.   Hematological: Negative.   Psychiatric/Behavioral: Negative.        Objective:   Physical Exam Vitals and nursing note reviewed.  Constitutional:      Appearance: Normal appearance.  HENT:     Head: Normocephalic and atraumatic.  Cardiovascular:     Rate and Rhythm: Normal rate.     Pulses: Normal pulses.  Pulmonary:     Effort:  Pulmonary effort is normal. No respiratory distress.  Abdominal:     General: Abdomen is flat. There is no distension.     Tenderness: There is no abdominal tenderness.  Skin:    General: Skin is warm.     Capillary Refill: Capillary refill takes less than 2 seconds.  Neurological:     General: No focal deficit present.     Mental Status: [name] alert and oriented to person, place, and time.  Psychiatric:        Mood and Affect: Mood normal.        Behavior: Behavior normal.        Thought Content: Thought content normal.       Assessment & Plan:     ICD-10-CM   1. S/P mastectomy, bilateral  Z90.13   2. Acquired absence of left breast  Z90.12   3. Breast asymmetry following reconstructive surgery  N65.1     We talked about the options for fat grafting bilaterally.  We could also tighten up the left tissue after the laxity has occurred with the latissimus muscle.  I also discussed with the patient the possibility of TRAM or DIEP flap.  This is a bit of a challenge because she is trying to lose weight.  This will affect her size potential to create to breast with her abdominal tissue.  She does not have  a lot of subcu on the right breast so a larger implant would be risky. Overall we decided to wait and give her more time to reduce her weight.  Then we will we can reevaluate.  [name] also interested in nipple recreation with areola tattooing.  Some of this could be done at the same time.  She will plan to see [place] back in 3 months. Pictures were obtained of the patient and placed in the chart with the patient's or guardian's permission.

## 2020-07-19 ENCOUNTER — Ambulatory Visit (INDEPENDENT_AMBULATORY_CARE_PROVIDER_SITE_OTHER): Payer: 59 | Admitting: Physician Assistant

## 2020-07-19 ENCOUNTER — Encounter: Payer: Self-pay | Admitting: Physician Assistant

## 2020-07-19 ENCOUNTER — Other Ambulatory Visit: Payer: Self-pay

## 2020-07-19 VITALS — BP 126/81 | HR 80 | Ht 61.0 in | Wt 202.1 lb

## 2020-07-19 DIAGNOSIS — Z13220 Encounter for screening for lipoid disorders: Secondary | ICD-10-CM

## 2020-07-19 DIAGNOSIS — R7302 Impaired glucose tolerance (oral): Secondary | ICD-10-CM

## 2020-07-19 DIAGNOSIS — E559 Vitamin D deficiency, unspecified: Secondary | ICD-10-CM

## 2020-07-19 DIAGNOSIS — Z0000 Encounter for general adult medical examination without abnormal findings: Secondary | ICD-10-CM

## 2020-07-19 DIAGNOSIS — Z171 Estrogen receptor negative status [ER-]: Secondary | ICD-10-CM

## 2020-07-19 DIAGNOSIS — Z1329 Encounter for screening for other suspected endocrine disorder: Secondary | ICD-10-CM

## 2020-07-19 DIAGNOSIS — C50412 Malignant neoplasm of upper-outer quadrant of left female breast: Secondary | ICD-10-CM

## 2020-07-19 NOTE — Progress Notes (Signed)
Established Patient Office Visit  Subjective:  Patient ID: Cheryl [name], female    DOB: November 15, 1976  Age: 44 y.o. MRN: [id number]  CC:  Chief Complaint  Patient presents with  . Thyroid Problem    HPI Cheryl [name] presents for follow up on prediabetes. Pt reports she has joined [employer] since June and has lost weight.  She walks 1 mile every day. States overall she feels so much better. Denies increased thirst or urination.   History of breast cancer: She follows up with oncology annually and is on Anastrozole.  Past Medical History:  Diagnosis Date  . Breast cancer ([facility]) 2016 and 2018   left breast, surgery and chemo done 2018  . Cellulitis 07/25/2018   left breast  . Pre-diabetes   . SVD (spontaneous vaginal delivery)    x 3  . Wears contact lenses     Past Surgical History:  Procedure Laterality Date  . ABDOMINAL HYSTERECTOMY    . BILATERAL TOTAL MASTECTOMY WITH AXILLARY LYMPH NODE DISSECTION  04/20/2017  . BREAST BIOPSY Left 02/18/2015  . BREAST BIOPSY Right 02/18/2015  . BREAST EXCISIONAL BIOPSY Left 04/05/2015  . BREAST IMPLANT REMOVAL Left 09/15/2018   Procedure: REMOVAL BREAST IMPLANTS;  Surgeon: Wallace Going, DO;  Location: [facility];  Service: Plastics;  Laterality: Left;  . BREAST LUMPECTOMY Left 03/27/2015  . BREAST LUMPECTOMY WITH NEEDLE LOCALIZATION AND AXILLARY SENTINEL LYMPH NODE BX Left 03/27/2015   Procedure: LEFT BREAST LUMPECTOMY WITH BRACKETED NEEDLE LOCALIZATION AND LEFT  AXILLARY SENTINEL LYMPH NODE BX;  Surgeon: [name], MD;  Location: [facility];  Service: General;  Laterality: Left;  . BREAST RECONSTRUCTION WITH PLACEMENT OF TISSUE EXPANDER AND FLEX HD (ACELLULAR HYDRATED DERMIS) Bilateral 04/20/2017   Procedure: BILATARAL BREAST RECONSTRUCTION WITH PLACEMENT OF TISSUE EXPANDER AND FLEX HD (ACELLULAR HYDRATED DERMIS);  Surgeon: Wallace Going, DO;  Location: [facility];  Service: Plastics;  Laterality:  Bilateral;  . BREAST REDUCTION SURGERY Right 12/28/2018   Procedure: Liposuction of medial right breast for improved symmetry;  Surgeon: Wallace Going, DO;  Location: [facility];  Service: Plastics;  Laterality: Right;  . BREAST SURGERY Bilateral    masectomy  . CHOLECYSTECTOMY N/A 07/07/2017   Procedure: LAPAROSCOPIC CHOLECYSTECTOMY WITH INTRAOPERATIVE CHOLANGIOGRAM;  Surgeon: [name], MD;  Location: [facility];  Service: General;  Laterality: N/A;  . Insertion of Essure  yrs ago   For Birth control  . LAPAROSCOPIC VAGINAL HYSTERECTOMY WITH SALPINGO OOPHORECTOMY Bilateral 09/08/2018   Procedure: LAPAROSCOPIC ASSISTED VAGINAL HYSTERECTOMY WITH SALPINGO OOPHORECTOMY;  Surgeon: [name], MD;  Location: [facility];  Service: Gynecology;  Laterality: Bilateral;  . LATISSIMUS FLAP TO BREAST Left 09/15/2018   Procedure: LATISSIMUS FLAP TO LEFT BREAST;  Surgeon: Wallace Going, DO;  Location: [facility];  Service: Plastics;  Laterality: Left;  . LIPOSUCTION WITH LIPOFILLING Bilateral 05/20/2018   Procedure: Bilateral breast fat grafting from abdomen to improve symmetry following breast reconstruction;  Surgeon: Wallace Going, DO;  Location: [facility];  Service: Plastics;  Laterality: Bilateral;  . MASTECTOMY W/ SENTINEL NODE BIOPSY Bilateral 04/20/2017   Procedure: BILATERAL TOTAL MASTECTOMY WITH LEFT AXILLARY SENTINEL LYMPH NODE BIOPSY;  Surgeon: [name], MD;  Location: [facility];  Service: General;  Laterality: Bilateral;  . PORTA CATH REMOVAL Right 11/04/2017   Procedure: PORTA CATH REMOVAL;  Surgeon: Wallace Going, DO;  Location: [facility];  Service: Plastics;  Laterality: Right;  .  PORTACATH PLACEMENT Right 04/25/2015   Procedure: INSERTION PORT-A-CATH;  Surgeon: [name], MD;  Location: [facility];  Service: General;  Laterality: Right;, renoved in 2016  . PORTACATH  PLACEMENT Right 04/20/2017   Procedure: INSERTION PORT-A-CATH;  Surgeon: [name], MD;  Location: [facility];  Service: General;  Laterality: Right;  . RE-EXCISION OF BREAST LUMPECTOMY Left 04/05/2015   Procedure: RE-EXCISION LEFT  BREAST LUMPECTOMY;  Surgeon: [name], MD;  Location: [facility];  Service: General;  Laterality: Left;  . REMOVAL OF TISSUE EXPANDER AND PLACEMENT OF IMPLANT Bilateral 11/04/2017   Procedure: BILATERAL REMOVAL OF TISSUE EXPANDER AND PLACEMENT OF  BILATERAL BREAST IMPLANT;  Surgeon: Wallace Going, DO;  Location: [facility];  Service: Plastics;  Laterality: Bilateral;  . REMOVAL OF TISSUE EXPANDER AND PLACEMENT OF IMPLANT Left 12/28/2018   Procedure: Removal of left breast expander and placement of silicone implant.;  Surgeon: Wallace Going, DO;  Location: [facility];  Service: Plastics;  Laterality: Left;  . TISSUE EXPANDER PLACEMENT Left 09/15/2018   Procedure: PLACEMENT OF TISSUE EXPANDER;  Surgeon: Wallace Going, DO;  Location: [facility];  Service: Plastics;  Laterality: Left;    Family History  Problem Relation Age of Onset  . Lung cancer Maternal Grandmother 57       non smoker  . Lung cancer Maternal Grandfather 57       non smoker worked in [employer]  . Cancer Paternal Grandfather 35       throat cancer ? smoker  . Cancer Cousin 41       throat cancer ? smoker    Social History   Socioeconomic History  . Marital status: Divorced    Spouse name: Not on file  . Number of children: 3  . Years of education: Not on file  . Highest education level: Not on file  Occupational History  . Not on file  Tobacco Use  . Smoking status: Never Smoker  . Smokeless tobacco: Never Used  Vaping Use  . Vaping Use: Never used  Substance and Sexual Activity  . Alcohol use: Yes    Alcohol/week: 0.0 standard drinks    Comment: socially twice month  . Drug use: No  . Sexual activity: Yes     Birth control/protection: Surgical    Comment: Essure   Other Topics Concern  . Not on file  Social History Narrative  . Not on file   Social Determinants of Health   Financial Resource Strain:   . Difficulty of Paying Living Expenses: Not on file  Food Insecurity:   . Worried About Charity fundraiser in the Last Year: Not on file  . Ran Out of Food in the Last Year: Not on file  Transportation Needs:   . Lack of Transportation (Medical): Not on file  . Lack of Transportation (Non-Medical): Not on file  Physical Activity:   . Days of Exercise per Week: Not on file  . Minutes of Exercise per Session: Not on file  Stress:   . Feeling of Stress : Not on file  Social Connections:   . Frequency of Communication with Friends and Family: Not on file  . Frequency of Social Gatherings with Friends and Family: Not on file  . Attends Religious Services: Not on file  . Active Member of Clubs or Organizations: Not on file  . Attends Archivist Meetings: Not on file  . Marital Status: Not on  file  Intimate Partner Violence:   . Fear of Current or Ex-Partner: Not on file  . Emotionally Abused: Not on file  . Physically Abused: Not on file  . Sexually Abused: Not on file    Outpatient Medications Prior to Visit  Medication Sig Dispense Refill  . anastrozole (ARIMIDEX) 1 MG tablet Take 1 tablet (1 mg total) by mouth daily. 90 tablet 4  . b complex vitamins tablet Take 1 tablet by mouth daily.    . Vitamin D, Ergocalciferol, (DRISDOL) 1.25 MG (50000 UT) CAPS capsule TAKE 1 CAPSULE (50,000 UNITS TOTAL) BY MOUTH EVERY 7 (SEVEN) DAYS. 12 capsule 10  . ibuprofen (ADVIL,MOTRIN) 600 MG tablet Take 1 tablet (600 mg total) by mouth every 6 (six) hours as needed (mild pain). (Patient not taking: Reported on 07/19/2020) 30 tablet 0   No facility-administered medications prior to visit.    Allergies  Allergen Reactions  . Adhesive [Tape] Other (See Comments)    Band Aids causes Skin  blisters.  All other tape is okay to use    ROS Review of Systems  A fourteen system review of systems was performed and found to be positive as per HPI.  Objective:    Physical Exam General:  Well Developed, well nourished, appropriate for stated age.  Neuro:  Alert and oriented,  extra-ocular muscles intact  HEENT:  Normocephalic, atraumatic, neck supple Skin:  no gross rash, warm, pink. Cardiac:  RRR, S1 S2 Respiratory:  ECTA B/L and A/P, Not using accessory muscles, speaking in full sentences- unlabored. Vascular:  Ext warm, no cyanosis apprec.; cap RF less 2 sec. No gross edema Psych:  No HI/SI, judgement and insight good, Euthymic mood. Full Affect.   BP 126/81   Pulse 80   Ht '5\' 1"'  (1.549 m)   Wt 202 lb 1.6 oz (91.7 kg)   LMP 04/02/2015 (Approximate) Comment: after chemo - no periods  SpO2 98%   BMI 38.19 kg/m  Wt Readings from Last 3 Encounters:  07/19/20 202 lb 1.6 oz (91.7 kg)  02/09/20 217 lb 6.4 oz (98.6 kg)  12/25/19 222 lb 4.8 oz (100.8 kg)     Health Maintenance Due  Topic Date Due  . Hepatitis C Screening  Never done  . COVID-19 Vaccine (1) Never done  . TETANUS/TDAP  Never done  . INFLUENZA VACCINE  06/23/2020    There are no preventive care reminders to display for this patient.  Lab Results  Component Value Date   TSH 2.170 04/01/2018   Lab Results  Component Value Date   WBC 6.5 12/25/2019   HGB 12.6 12/25/2019   HCT 39.6 12/25/2019   MCV 91.7 12/25/2019   PLT 322 12/25/2019   Lab Results  Component Value Date   NA 140 12/25/2019   K 4.2 12/25/2019   CHLORIDE 105 10/21/2017   CO2 26 12/25/2019   GLUCOSE 126 (H) 12/25/2019   BUN 14 12/25/2019   CREATININE 0.81 12/25/2019   BILITOT 0.7 12/25/2019   ALKPHOS 91 12/25/2019   AST 18 12/25/2019   ALT 25 12/25/2019   PROT 7.3 12/25/2019   ALBUMIN 4.1 12/25/2019   CALCIUM 9.1 12/25/2019   ANIONGAP 10 12/25/2019   EGFR >60 10/21/2017   Lab Results  Component Value Date   CHOL  265 (H) 04/01/2018   Lab Results  Component Value Date   HDL 52 04/01/2018   Lab Results  Component Value Date   LDLCALC 177 (H) 04/01/2018   Lab Results  Component Value Date   TRIG 182 (H) 04/01/2018   Lab Results  Component Value Date   CHOLHDL 5.1 (H) 04/01/2018   Lab Results  Component Value Date   HGBA1C 6.4 (A) 11/18/2018      Assessment & Plan:   Problem List Items Addressed This Visit      Endocrine   Glucose intolerance (impaired glucose tolerance) - Primary   Relevant Orders   HgB A1c     Other   Malignant neoplasm of upper-outer quadrant of left breast in female, estrogen receptor negative (HCC)   Vitamin D insufficiency   Relevant Orders   Vitamin D (25 hydroxy)    Other Visit Diagnoses    Healthcare maintenance       Relevant Orders   Comp Met (CMET)   Lipid Profile   CBC w/Diff   TSH   Vitamin D (25 hydroxy)   HgB A1c   Screening for thyroid disorder       Relevant Orders   TSH   Screening for lipid disorders       Relevant Orders   Lipid Profile     Healthcare Maintenance: -Patient has lost 20 pounds since 12/2019, praised patient on weight loss. -Continue with weight watchers and dietary and lifestyle modifications. -Patient is fasting today so will collect blood work. -Pending Vit D levels will send additional refills of Vit D 50,000 units. -Follow-up in 3 to 4 months for CPE.  Malignant neoplasm of upper -outer quadrant of left breast in female, estrogen receptor negative: -Followed by oncology, Dr. Jana Hakim -Continue current medication regimen.  No orders of the defined types were placed in this encounter.   Follow-up: Return for CPE in 3-4 months.   Note:  This note was prepared with assistance of Dragon voice recognition software. Occasional wrong-word or sound-a-like substitutions may have occurred due to the inherent limitations of voice recognition software.   Lorrene Reid, PA-C

## 2020-07-20 LAB — LIPID PANEL
Chol/HDL Ratio: 4.7 ratio — ABNORMAL HIGH (ref 0.0–4.4)
Cholesterol, Total: 253 mg/dL — ABNORMAL HIGH (ref 100–199)
HDL: 54 mg/dL (ref >39)
LDL Chol Calc (NIH): 177 mg/dL — ABNORMAL HIGH (ref 0–99)
Triglycerides: 124 mg/dL (ref 0–149)
VLDL Cholesterol Cal: 22 mg/dL (ref 5–40)

## 2020-07-20 LAB — CBC WITH DIFFERENTIAL/PLATELET
Basophils Absolute: 0 10*3/uL (ref 0.0–0.2)
Basos: 1 %
EOS (ABSOLUTE): 0.2 10*3/uL (ref 0.0–0.4)
Eos: 2 %
Hematocrit: 38.3 % (ref 34.0–46.6)
Hemoglobin: 13 g/dL (ref 11.1–15.9)
Immature Grans (Abs): 0 10*3/uL (ref 0.0–0.1)
Immature Granulocytes: 0 %
Lymphocytes Absolute: 2.2 10*3/uL (ref 0.7–3.1)
Lymphs: 34 %
MCH: 30.2 pg (ref 26.6–33.0)
MCHC: 33.9 g/dL (ref 31.5–35.7)
MCV: 89 fL (ref 79–97)
Monocytes Absolute: 0.3 10*3/uL (ref 0.1–0.9)
Monocytes: 4 %
Neutrophils Absolute: 3.9 10*3/uL (ref 1.4–7.0)
Neutrophils: 59 %
Platelets: 320 10*3/uL (ref 150–450)
RBC: 4.3 x10E6/uL (ref 3.77–5.28)
RDW: 13 % (ref 11.7–15.4)
WBC: 6.6 10*3/uL (ref 3.4–10.8)

## 2020-07-20 LAB — COMPREHENSIVE METABOLIC PANEL
ALT: 18 IU/L (ref 0–32)
AST: 21 IU/L (ref 0–40)
Albumin/Globulin Ratio: 1.6 (ref 1.2–2.2)
Albumin: 4.5 g/dL (ref 3.8–4.8)
Alkaline Phosphatase: 106 IU/L (ref 48–121)
BUN/Creatinine Ratio: 12 (ref 9–23)
BUN: 9 mg/dL (ref 6–24)
Bilirubin Total: 0.7 mg/dL (ref 0.0–1.2)
CO2: 22 mmol/L (ref 20–29)
Calcium: 9.6 mg/dL (ref 8.7–10.2)
Chloride: 104 mmol/L (ref 96–106)
Creatinine, Ser: 0.76 mg/dL (ref 0.57–1.00)
GFR calc Af Amer: 110 mL/min/{1.73_m2} (ref >59)
GFR calc non Af Amer: 95 mL/min/{1.73_m2} (ref >59)
Globulin, Total: 2.8 g/dL (ref 1.5–4.5)
Glucose: 105 mg/dL — ABNORMAL HIGH (ref 65–99)
Potassium: 4.2 mmol/L (ref 3.5–5.2)
Sodium: 141 mmol/L (ref 134–144)
Total Protein: 7.3 g/dL (ref 6.0–8.5)

## 2020-07-20 LAB — TSH: TSH: 1.38 u[IU]/mL (ref 0.450–4.500)

## 2020-07-20 LAB — HEMOGLOBIN A1C
Est. average glucose Bld gHb Est-mCnc: 126 mg/dL
Hgb A1c MFr Bld: 6 % — ABNORMAL HIGH (ref 4.8–5.6)

## 2020-07-20 LAB — VITAMIN D 25 HYDROXY (VIT D DEFICIENCY, FRACTURES): Vit D, 25-Hydroxy: 29.3 ng/mL — ABNORMAL LOW (ref 30.0–100.0)

## 2020-07-23 ENCOUNTER — Telehealth: Payer: Self-pay | Admitting: Physician Assistant

## 2020-07-23 ENCOUNTER — Other Ambulatory Visit: Payer: Self-pay | Admitting: Physician Assistant

## 2020-07-23 DIAGNOSIS — E559 Vitamin D deficiency, unspecified: Secondary | ICD-10-CM

## 2020-07-23 MED ORDER — VITAMIN D (ERGOCALCIFEROL) 1.25 MG (50000 UNIT) PO CAPS: ORAL_CAPSULE | ORAL | 4 refills | Status: DC

## 2020-07-23 NOTE — Telephone Encounter (Signed)
Patient called and left VM asking for update on lab results, please contact when available at [phone]

## 2020-07-23 NOTE — Telephone Encounter (Signed)
Left message for patient to call back. AS, CMA 

## 2020-07-24 NOTE — Telephone Encounter (Signed)
Unable to reach patient. Left message for pt to call back. AS, CMA

## 2020-07-25 NOTE — Telephone Encounter (Signed)
Attempted to call patient again with no answer. Left message to call back. Saving message to chart. AS< CMA

## 2020-08-15 ENCOUNTER — Ambulatory Visit (INDEPENDENT_AMBULATORY_CARE_PROVIDER_SITE_OTHER): Payer: 59 | Admitting: Physician Assistant

## 2020-08-15 ENCOUNTER — Other Ambulatory Visit: Payer: Self-pay

## 2020-08-15 ENCOUNTER — Encounter: Payer: Self-pay | Admitting: Physician Assistant

## 2020-08-15 VITALS — BP 136/88 | HR 98 | Ht 61.0 in | Wt 204.1 lb

## 2020-08-15 DIAGNOSIS — L729 Follicular cyst of the skin and subcutaneous tissue, unspecified: Secondary | ICD-10-CM

## 2020-08-15 NOTE — Progress Notes (Signed)
Acute Office Visit  Subjective:    Patient ID: Cheryl [name], female    DOB: Jun 17, 1976, 44 y.o.   MRN: [id number]  No chief complaint on file.   HPI Patient is in today for skin lesion behind left knee, which has been present for >1 month. Reports lesion has increased in size and sometimes is tender and/or itchy. Yesterday it was draining. Denies fever.   Past Medical History:  Diagnosis Date  . Breast cancer ([place]) 2016 and 2018   left breast, surgery and chemo done 2018  . Cellulitis 07/25/2018   left breast  . Pre-diabetes   . SVD (spontaneous vaginal delivery)    x 3  . Wears contact lenses     Past Surgical History:  Procedure Laterality Date  . ABDOMINAL HYSTERECTOMY    . BILATERAL TOTAL MASTECTOMY WITH AXILLARY LYMPH NODE DISSECTION  04/20/2017  . BREAST BIOPSY Left 02/18/2015  . BREAST BIOPSY Right 02/18/2015  . BREAST EXCISIONAL BIOPSY Left 04/05/2015  . BREAST IMPLANT REMOVAL Left 09/15/2018   Procedure: REMOVAL BREAST IMPLANTS;  Surgeon: Wallace Going, DO;  Location: [facility];  Service: Plastics;  Laterality: Left;  . BREAST LUMPECTOMY Left 03/27/2015  . BREAST LUMPECTOMY WITH NEEDLE LOCALIZATION AND AXILLARY SENTINEL LYMPH NODE BX Left 03/27/2015   Procedure: LEFT BREAST LUMPECTOMY WITH BRACKETED NEEDLE LOCALIZATION AND LEFT  AXILLARY SENTINEL LYMPH NODE BX;  Surgeon: [name], MD;  Location: [facility];  Service: General;  Laterality: Left;  . BREAST RECONSTRUCTION WITH PLACEMENT OF TISSUE EXPANDER AND FLEX HD (ACELLULAR HYDRATED DERMIS) Bilateral 04/20/2017   Procedure: BILATARAL BREAST RECONSTRUCTION WITH PLACEMENT OF TISSUE EXPANDER AND FLEX HD (ACELLULAR HYDRATED DERMIS);  Surgeon: Wallace Going, DO;  Location: [facility];  Service: Plastics;  Laterality: Bilateral;  . BREAST REDUCTION SURGERY Right 12/28/2018   Procedure: Liposuction of medial right breast for improved symmetry;  Surgeon: Wallace Going, DO;  Location:  [place] [facility];  Service: Plastics;  Laterality: Right;  . BREAST SURGERY Bilateral    masectomy  . CHOLECYSTECTOMY N/A 07/07/2017   Procedure: LAPAROSCOPIC CHOLECYSTECTOMY WITH INTRAOPERATIVE CHOLANGIOGRAM;  Surgeon: [name], MD;  Location: [facility];  Service: General;  Laterality: N/A;  . Insertion of Essure  yrs ago   For Birth control  . LAPAROSCOPIC VAGINAL HYSTERECTOMY WITH SALPINGO OOPHORECTOMY Bilateral 09/08/2018   Procedure: LAPAROSCOPIC ASSISTED VAGINAL HYSTERECTOMY WITH SALPINGO OOPHORECTOMY;  Surgeon: [name], MD;  Location: [facility];  Service: Gynecology;  Laterality: Bilateral;  . LATISSIMUS FLAP TO BREAST Left 09/15/2018   Procedure: LATISSIMUS FLAP TO LEFT BREAST;  Surgeon: Wallace Going, DO;  Location: [facility] [facility];  Service: Plastics;  Laterality: Left;  . LIPOSUCTION WITH LIPOFILLING Bilateral 05/20/2018   Procedure: Bilateral breast fat grafting from abdomen to improve symmetry following breast reconstruction;  Surgeon: Wallace Going, DO;  Location: [place];  Service: Plastics;  Laterality: Bilateral;  . MASTECTOMY W/ SENTINEL NODE BIOPSY Bilateral 04/20/2017   Procedure: BILATERAL TOTAL MASTECTOMY WITH LEFT AXILLARY SENTINEL LYMPH NODE BIOPSY;  Surgeon: [name], MD;  Location: [place];  Service: General;  Laterality: Bilateral;  . PORTA CATH REMOVAL Right 11/04/2017   Procedure: PORTA CATH REMOVAL;  Surgeon: Wallace Going, DO;  Location: [place];  Service: Plastics;  Laterality: Right;  . PORTACATH PLACEMENT Right 04/25/2015   Procedure: INSERTION PORT-A-CATH;  Surgeon: [name], MD;  Location: [facility];  Service: General;  Laterality: Right;, renoved in  2016  . PORTACATH PLACEMENT Right 04/20/2017   Procedure: INSERTION PORT-A-CATH;  Surgeon: [name], MD;  Location: [facility];  Service: General;  Laterality: Right;  . RE-EXCISION OF BREAST  LUMPECTOMY Left 04/05/2015   Procedure: RE-EXCISION LEFT  BREAST LUMPECTOMY;  Surgeon: [name], MD;  Location: [facility];  Service: General;  Laterality: Left;  . REMOVAL OF TISSUE EXPANDER AND PLACEMENT OF IMPLANT Bilateral 11/04/2017   Procedure: BILATERAL REMOVAL OF TISSUE EXPANDER AND PLACEMENT OF  BILATERAL BREAST IMPLANT;  Surgeon: Wallace Going, DO;  Location: [place];  Service: Plastics;  Laterality: Bilateral;  . REMOVAL OF TISSUE EXPANDER AND PLACEMENT OF IMPLANT Left 12/28/2018   Procedure: Removal of left breast expander and placement of silicone implant.;  Surgeon: Wallace Going, DO;  Location: [place];  Service: Plastics;  Laterality: Left;  . TISSUE EXPANDER PLACEMENT Left 09/15/2018   Procedure: PLACEMENT OF TISSUE EXPANDER;  Surgeon: Wallace Going, DO;  Location: [place] [facility];  Service: Plastics;  Laterality: Left;    Family History  Problem Relation Age of Onset  . Lung cancer Maternal Grandmother 4       non smoker  . Lung cancer Maternal Grandfather 8       non smoker worked in [employer]  . Cancer Paternal Grandfather 35       throat cancer ? smoker  . Cancer Cousin 41       throat cancer ? smoker    Social History   Socioeconomic History  . Marital status: Divorced    Spouse name: Not on file  . Number of children: 3  . Years of education: Not on file  . Highest education level: Not on file  Occupational History  . Not on file  Tobacco Use  . Smoking status: Never Smoker  . Smokeless tobacco: Never Used  Vaping Use  . Vaping Use: Never used  Substance and Sexual Activity  . Alcohol use: Yes    Alcohol/week: 0.0 standard drinks    Comment: socially twice month  . Drug use: No  . Sexual activity: Yes    Birth control/protection: Surgical    Comment: Essure   Other Topics Concern  . Not on file  Social History Narrative  . Not on file   Social Determinants of Health    Financial Resource Strain:   . Difficulty of Paying Living Expenses: Not on file  Food Insecurity:   . Worried About Charity fundraiser in the Last Year: Not on file  . Ran Out of Food in the Last Year: Not on file  Transportation Needs:   . Lack of Transportation (Medical): Not on file  . Lack of Transportation (Non-Medical): Not on file  Physical Activity:   . Days of Exercise per Week: Not on file  . Minutes of Exercise per Session: Not on file  Stress:   . Feeling of Stress : Not on file  Social Connections:   . Frequency of Communication with Friends and Family: Not on file  . Frequency of Social Gatherings with Friends and Family: Not on file  . Attends Religious Services: Not on file  . Active Member of Clubs or Organizations: Not on file  . Attends Archivist Meetings: Not on file  . Marital Status: Not on file  Intimate Partner Violence:   . Fear of Current or Ex-Partner: Not on file  . Emotionally Abused: Not on file  . Physically Abused: Not  on file  . Sexually Abused: Not on file    Outpatient Medications Prior to Visit  Medication Sig Dispense Refill  . anastrozole (ARIMIDEX) 1 MG tablet Take 1 tablet (1 mg total) by mouth daily. 90 tablet 4  . b complex vitamins tablet Take 1 tablet by mouth daily.    . Vitamin D, Ergocalciferol, (DRISDOL) 1.25 MG (50000 UNIT) CAPS capsule TAKE 1 CAPSULE (50,000 UNITS TOTAL) BY MOUTH EVERY 7 (SEVEN) DAYS. 12 capsule 4   No facility-administered medications prior to visit.    Allergies  Allergen Reactions  . Adhesive [Tape] Other (See Comments)    Band Aids causes Skin blisters.  All other tape is okay to use    Review of Systems A fourteen system review of systems was performed and found to be positive as per HPI.  Objective:    Physical Exam General:  Well Developed, well nourished, appropriate for stated age.  Neuro:  Alert and oriented,  extra-ocular muscles intact  HEENT:  Normocephalic, atraumatic,  neck supple, no carotid bruits appreciated  Skin:  Firm non-erythematous, non-draining nodule behind left popliteal fossa with healthy eschar Respiratory: Not using accessory muscles, speaking in full sentences- unlabored. Vascular:  Ext warm, no cyanosis apprec.; Psych:  No HI/SI, judgement and insight good, Euthymic mood. Full Affect.    BP 136/88   Pulse 98   Ht _0  (1.549 m)   Wt 204 lb 1.6 oz (92.6 kg)   LMP 04/02/2015 (Approximate) Comment: after chemo - no periods  SpO2 97%   BMI 38.56 kg/m  Wt Readings from Last 3 Encounters:  08/15/20 204 lb 1.6 oz (92.6 kg)  07/19/20 202 lb 1.6 oz (91.7 kg)  02/09/20 217 lb 6.4 oz (98.6 kg)    Health Maintenance Due  Topic Date Due  . Hepatitis C Screening  Never done  . COVID-19 Vaccine (1) Never done  . TETANUS/TDAP  Never done  . INFLUENZA VACCINE  Never done    There are no preventive care reminders to display for this patient.   Lab Results  Component Value Date   TSH 1.380 07/19/2020   Lab Results  Component Value Date   WBC 6.6 07/19/2020   HGB 13.0 07/19/2020   HCT 38.3 07/19/2020   MCV 89 07/19/2020   PLT 320 07/19/2020   Lab Results  Component Value Date   NA 141 07/19/2020   K 4.2 07/19/2020   CHLORIDE 105 10/21/2017   CO2 22 07/19/2020   GLUCOSE 105 (H) 07/19/2020   BUN 9 07/19/2020   CREATININE 0.76 07/19/2020   BILITOT 0.7 07/19/2020   ALKPHOS 106 07/19/2020   AST 21 07/19/2020   ALT 18 07/19/2020   PROT 7.3 07/19/2020   ALBUMIN 4.5 07/19/2020   CALCIUM 9.6 07/19/2020   ANIONGAP 10 12/25/2019   EGFR >60 10/21/2017   Lab Results  Component Value Date   CHOL 253 (H) 07/19/2020   Lab Results  Component Value Date   HDL 54 07/19/2020   Lab Results  Component Value Date   LDLCALC 177 (H) 07/19/2020   Lab Results  Component Value Date   TRIG 124 07/19/2020   Lab Results  Component Value Date   CHOLHDL 4.7 (H) 07/19/2020   Lab Results  Component Value Date   HGBA1C 6.0 (H)  07/19/2020       Assessment & Plan:   Problem List Items Addressed This Visit    None    Visit Diagnoses    Skin cyst    -  Primary   Relevant Orders   Ambulatory referral to Dermatology     Skin cyst: -Appearance and symptoms are suggestive of skin cyst and discussed with patient referral to dermatology for removal. Pt is agreeable. -Reassurance provided no signs of infection/cellulitis present and if starts developing tenderness, redness, or drainage in the area to notify the clinic and will start antibiotic. -Recommend to keep area clean and dry and use topical antibiotic if needed.    No orders of the defined types were placed in this encounter.    Lorrene Reid, PA-C

## 2020-08-19 ENCOUNTER — Encounter: Payer: Self-pay | Admitting: Physician Assistant

## 2020-08-19 ENCOUNTER — Telehealth: Payer: Self-pay | Admitting: Physician Assistant

## 2020-08-19 DIAGNOSIS — L729 Follicular cyst of the skin and subcutaneous tissue, unspecified: Secondary | ICD-10-CM

## 2020-08-19 MED ORDER — DOXYCYCLINE HYCLATE 100 MG PO TABS: 100.0000 mg | ORAL_TABLET | Freq: Two times a day (BID) | ORAL | 0 refills | Status: DC

## 2020-08-19 NOTE — Telephone Encounter (Signed)
Please advise.  T. Everest Hacking, CMA 

## 2020-08-19 NOTE — Telephone Encounter (Signed)
Patient was seen last week for acute OV for spot on her leg. She called this morning saying she was advised by Herb Grays to call our office if it started draining, which is has off and on all weekend. She was told she could get an abx if that happened. If approved please send to [facility].

## 2020-08-19 NOTE — Telephone Encounter (Signed)
Spoke with Cheryl [name] who is agreeable to Doxycycline 100mg  1 po bid x 10days. Med sent to pharmacy. AS, CMA

## 2020-09-20 ENCOUNTER — Ambulatory Visit: Payer: 59 | Admitting: Plastic Surgery

## 2020-10-09 ENCOUNTER — Other Ambulatory Visit: Payer: Self-pay | Admitting: Oncology

## 2020-10-21 ENCOUNTER — Telehealth: Payer: Self-pay | Admitting: Oncology

## 2020-10-21 NOTE — Telephone Encounter (Signed)
Rescheduled appts per 11/17 sch msg. Left voicemail with cancellation details and new appt date and time.

## 2020-10-23 ENCOUNTER — Other Ambulatory Visit: Payer: Self-pay

## 2020-10-23 ENCOUNTER — Ambulatory Visit
Admission: RE | Admit: 2020-10-23 | Discharge: 2020-10-23 | Disposition: A | Discharge status: Home / Self Care | Payer: 59 | Source: Ambulatory Visit | Attending: Oncology | Admitting: Oncology

## 2020-10-23 DIAGNOSIS — C50412 Malignant neoplasm of upper-outer quadrant of left female breast: Secondary | ICD-10-CM

## 2020-10-23 DIAGNOSIS — C50912 Malignant neoplasm of unspecified site of left female breast: Secondary | ICD-10-CM

## 2020-10-23 DIAGNOSIS — E7800 Pure hypercholesterolemia, unspecified: Secondary | ICD-10-CM

## 2020-10-23 DIAGNOSIS — Z9889 Other specified postprocedural states: Secondary | ICD-10-CM

## 2020-10-23 DIAGNOSIS — Z90710 Acquired absence of both cervix and uterus: Secondary | ICD-10-CM

## 2020-10-23 DIAGNOSIS — E559 Vitamin D deficiency, unspecified: Secondary | ICD-10-CM

## 2020-10-23 DIAGNOSIS — M8580 Other specified disorders of bone density and structure, unspecified site: Secondary | ICD-10-CM

## 2020-10-23 DIAGNOSIS — G620 Drug-induced polyneuropathy: Secondary | ICD-10-CM

## 2020-10-23 DIAGNOSIS — Z9013 Acquired absence of bilateral breasts and nipples: Secondary | ICD-10-CM

## 2020-10-23 DIAGNOSIS — R7302 Impaired glucose tolerance (oral): Secondary | ICD-10-CM

## 2020-10-23 DIAGNOSIS — K7689 Other specified diseases of liver: Secondary | ICD-10-CM

## 2020-10-23 DIAGNOSIS — T451X5A Adverse effect of antineoplastic and immunosuppressive drugs, initial encounter: Secondary | ICD-10-CM

## 2020-10-25 ENCOUNTER — Telehealth: Payer: Self-pay

## 2020-10-25 NOTE — Telephone Encounter (Signed)
Attempted to call pt to give results for Bone Density. Pt did not answer. LVM for pt to return call.

## 2020-10-25 NOTE — Telephone Encounter (Signed)
-----   Message from Gardenia Phlegm, NP sent at 10/24/2020 11:55 AM EST ----- Bone density is normal.  This is great news.   ----- Message ----- From: Interface, Rad Results In Sent: 10/23/2020   9:28 AM EST To: [name], MD

## 2020-11-12 ENCOUNTER — Telehealth: Payer: 59 | Admitting: Plastic Surgery

## 2020-11-29 ENCOUNTER — Encounter: Payer: 59 | Admitting: Physician Assistant

## 2020-12-03 ENCOUNTER — Other Ambulatory Visit: Payer: Self-pay

## 2020-12-03 ENCOUNTER — Telehealth (INDEPENDENT_AMBULATORY_CARE_PROVIDER_SITE_OTHER): Payer: 59 | Admitting: Plastic Surgery

## 2020-12-03 ENCOUNTER — Encounter: Payer: Self-pay | Admitting: Plastic Surgery

## 2020-12-03 DIAGNOSIS — Z9013 Acquired absence of bilateral breasts and nipples: Secondary | ICD-10-CM

## 2020-12-03 DIAGNOSIS — N651 Disproportion of reconstructed breast: Secondary | ICD-10-CM

## 2020-12-03 NOTE — Progress Notes (Signed)
   Subjective:    Patient ID: Cheryl [name], female    DOB: 1976-06-26, 45 y.o.   MRN: [id number]  The patient is a 45 year old female joining me by phone for further discussion on breast surgery revision.  She underwent bilateral mastectomies several years ago.  She had left-sided radiation.  She has a latissimus muscle flap with an implant on the left.  It is a Product manager high-profile extra gel 380 cc implant.  On the right she has a Mentor ultrahigh profile 590 cc gel implant.  She has some asymmetry due to weight gain and relaxation of the breasts. [name] 5 feet 2 inches tall.  Her weight is now 205 pounds. [name] interested in fat grafting for improved contour.   Review of Systems  Constitutional: Negative.   Eyes: Negative.   Respiratory: Negative.   Cardiovascular: Negative.   Gastrointestinal: Negative.   Genitourinary: Negative.   Musculoskeletal: Negative.   Skin: Negative.   Hematological: Negative.   Psychiatric/Behavioral: Negative.       Objective:   Physical Exam Neurological:     Mental Status: [name] alert.       Assessment & Plan:     ICD-10-CM   1. S/P mastectomy, bilateral  Z90.13   2. Breast asymmetry following reconstructive surgery  N65.1     The patient gave consent to have this visit done by telemedicine / virtual visit.  This is also consent for access the chart and treat the patient via this visit. The patient is located at home.  I, the provider, am at the office.  We spent 10 minutes together for the visit.     I would like to speak with her more for final decision on the surgical plan.

## 2020-12-05 ENCOUNTER — Telehealth: Payer: Self-pay | Admitting: Plastic Surgery

## 2020-12-05 NOTE — Telephone Encounter (Signed)
I am not really sure what surgery she has having as it is not listed in her chart, the previous televisit note has not been completed so I am not too sure.  Some general guidelines would be estimated 1 to 2 weeks out of work.  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  If she works from home she can return a few days after surgery depending on how she feels.  Her restrictions would be no lifting greater than 15 pounds for 6 weeks.

## 2020-12-05 NOTE — Telephone Encounter (Signed)
Patient called to see if she could be given an estimated time frame to be out of work after surgery (still being scheduled) and if there would be any weight restrictions once she's able to go back. Her job is wanting to know this info beforehand.  Please call patient to advise.

## 2020-12-20 ENCOUNTER — Telehealth (INDEPENDENT_AMBULATORY_CARE_PROVIDER_SITE_OTHER): Payer: 59 | Admitting: Plastic Surgery

## 2020-12-20 ENCOUNTER — Encounter: Payer: Self-pay | Admitting: Plastic Surgery

## 2020-12-20 ENCOUNTER — Other Ambulatory Visit: Payer: Self-pay

## 2020-12-20 DIAGNOSIS — N651 Disproportion of reconstructed breast: Secondary | ICD-10-CM

## 2020-12-20 NOTE — Progress Notes (Signed)
The patient is a 45 year old female joining me by phone for further discussion about her breast reconstruction. In 2008 the patient was seen for left-sided breast cancer. She had had a lumpectomy with radiation in the past and then had a recurrence. She decided on bilateral mastectomies and implant-based reconstruction. She had some complications with the left side that required a latissimus muscle flap for salvage. She now has on the right breast a Mentor ultra high profile 086 cc silicone implant and on the left a Mentor high-profile extra gel 380 cc implant. She had fat grafting in the past but it does not seem to have all stayed. [name] around 205 pounds. [name] interested in improved symmetry. She initially wanted to go larger with her implants but understands the risk involved due to the radiation on the left breast.  After another discussion the plan is for the following: Lipo filling bilateral breasts with revision of left breast flap and liposuction of lateral breasts.  I connected with  Lajean Silvius on 12/20/20 by a telemedicine application and verified that I am speaking with the correct person using two identifiers.   I discussed the limitations of evaluation and management by telemedicine. The patient expressed understanding and agreed to proceed.

## 2020-12-26 ENCOUNTER — Ambulatory Visit: Payer: 59 | Admitting: Oncology

## 2020-12-26 ENCOUNTER — Other Ambulatory Visit: Payer: 59

## 2021-01-01 NOTE — Progress Notes (Signed)
[place]  [phone] [phone]     ID: Cheryl [name] DOB: July 20, 1976  MR#: [id number]  QPR#:[id number]  Patient Care Team: Lorrene Reid, PA-C as PCP - General [name], MD as Consulting Physician (Obstetrics and Gynecology) [name], MD (Inactive) as Consulting Physician (General Surgery) Ereka Brau, Virgie Dad, MD as Consulting Physician (Oncology) [name], RN as Registered Nurse [name], RN as Registered Nurse Dillingham, [name], DO as Attending Physician (Plastic Surgery) OTHER MD:  CHIEF COMPLAINT: bilateral breast cancers, one estrogen receptor positive (s/p bilateral mastectomies)  CURRENT TREATMENT: Anastrozole   INTERVAL HISTORY:  Cheryl [name] returns today for a follow-up of her recurrent breast cancers  She continues on anastrozole.  She generally tolerates this well, with hot flashes that more or less come and go and some vaginal dryness that she does not think needs to be addressed.  Since her last visit, she underwent bone density screening on 10/23/2020 showing a T-score of -0.3, which is considered normal.   REVIEW OF SYSTEMS: Cheryl [name] tells me she is working full-time, same job, 20 year old daughter just got her driver's license, basically "everything is the same".  She exercises by walking up to 30 minutes at a time.   COVID 19 VACCINATION STATUS: Refuses vaccination, status post infection 10/2019   HISTORY OF RECURRENT DISEASE: From my 03/18/2017 note:  [name] that he'll returns today for follow-up of her triple negative breast cancer, which is now recurrent. She had routine bilateral diagnostic mammography at the [facility] 03/10/2017 and this showed the breast density to be category C. The old lumpectomy site, which was in the upper outer quadrant, appears stable. However there was a new area of asymmetry in the lower inner quadrant of the left breast. Ultrasound  confirmed this to be a 1.0 cm mass at the 7:00 radiant 5 cm from the nipple. Aspiration was attempted but no fluid was aspirated and biopsy of this mass was obtained 03/05/2017. This showed ([id number]) invasive mammary carcinoma, grade 2, estrogen and progesterone receptor negative, HER-2 not amplified with a signals ratio 1.17 and a number per cell of 2.45, with an MIB-1 of 15%.  [name] here today to discuss subsequent management   BREAST CANCER HISTORY: From the original intake note:  The patient had screening mammography 02/01/2015 which showed a calcifications in the upper outer quadrant of the left breast. On 02/08/2015 she underwent left diagnostic mammography with tomosynthesis and left breast ultrasonography at the breast Center. The breast density was category C. Mammography confirmed a group of pleomorphic calcifications spanning 4.2 cm maximally. Physical examination of the upper outer quadrant of the left breast showed an area of thickening at the approximate 2:00 position. Ultrasound of this area showed no discrete masses. There was vague shadowing present. Calcifications could not be clearly identified sonographically. There was no lymphadenopathy in the left axilla.  On the same day the patient underwent biopsy of the left breast area in question, with the pathology (S [id number]) showing ductal carcinoma in situ, grade 2 or 3, with possible areas of microinvasion, the cancer cells being strongly estrogen and progesterone receptor positive, both at 100%.  On 02/15/2015 the patient underwent bilateral breast MRI. This showed the area of malignancy in the upper outer quadrant to measure 4.5 cm, including a large area of non-masslike enhancement and a 1 cm spiculated mass. There was also an indeterminate oval mass in the central left breast and another indeterminant mass in the slightly outer  right breast anteriorly. Biopsy of the right breast mass 02/18/2015 ([id number]) showed a  fibroadenoma, with no evidence of malignancy. Biopsy of 2 additional areas of the left breast (at 10:00 and 2:00) showed a fibroadenoma in the upper inner quadrant, and pseudo-angiomatous stromal hyperplasia (PA SH) at the 2:00 area of the left breast.  The patient's subsequent history is as detailed below.   PAST MEDICAL HISTORY: Past Medical History:  Diagnosis Date  . Breast cancer ([place]) 2016 and 2018   left breast, surgery and chemo done 2018  . Cellulitis 07/25/2018   left breast  . Pre-diabetes   . SVD (spontaneous vaginal delivery)    x 3  . Wears contact lenses     PAST SURGICAL HISTORY: Past Surgical History:  Procedure Laterality Date  . ABDOMINAL HYSTERECTOMY    . BILATERAL TOTAL MASTECTOMY WITH AXILLARY LYMPH NODE DISSECTION  04/20/2017  . BREAST BIOPSY Left 02/18/2015  . BREAST BIOPSY Right 02/18/2015  . BREAST EXCISIONAL BIOPSY Left 04/05/2015  . BREAST IMPLANT REMOVAL Left 09/15/2018   Procedure: REMOVAL BREAST IMPLANTS;  Surgeon: Wallace Going, DO;  Location: [place];  Service: Plastics;  Laterality: Left;  . BREAST LUMPECTOMY Left 03/27/2015  . BREAST LUMPECTOMY WITH NEEDLE LOCALIZATION AND AXILLARY SENTINEL LYMPH NODE BX Left 03/27/2015   Procedure: LEFT BREAST LUMPECTOMY WITH BRACKETED NEEDLE LOCALIZATION AND LEFT  AXILLARY SENTINEL LYMPH NODE BX;  Surgeon: [name], MD;  Location: [place];  Service: General;  Laterality: Left;  . BREAST RECONSTRUCTION WITH PLACEMENT OF TISSUE EXPANDER AND FLEX HD (ACELLULAR HYDRATED DERMIS) Bilateral 04/20/2017   Procedure: BILATARAL BREAST RECONSTRUCTION WITH PLACEMENT OF TISSUE EXPANDER AND FLEX HD (ACELLULAR HYDRATED DERMIS);  Surgeon: Wallace Going, DO;  Location: [place];  Service: Plastics;  Laterality: Bilateral;  . BREAST REDUCTION SURGERY Right 12/28/2018   Procedure: Liposuction of medial right breast for improved symmetry;  Surgeon: Wallace Going, DO;  Location: [place];  Service: Plastics;  Laterality: Right;  . BREAST SURGERY Bilateral    masectomy  . CHOLECYSTECTOMY N/A 07/07/2017   Procedure: LAPAROSCOPIC CHOLECYSTECTOMY WITH INTRAOPERATIVE CHOLANGIOGRAM;  Surgeon: [name], MD;  Location: [facility];  Service: General;  Laterality: N/A;  . Insertion of Essure  yrs ago   For Birth control  . LAPAROSCOPIC VAGINAL HYSTERECTOMY WITH SALPINGO OOPHORECTOMY Bilateral 09/08/2018   Procedure: LAPAROSCOPIC ASSISTED VAGINAL HYSTERECTOMY WITH SALPINGO OOPHORECTOMY;  Surgeon: [name], MD;  Location: [place];  Service: Gynecology;  Laterality: Bilateral;  . LATISSIMUS FLAP TO BREAST Left 09/15/2018   Procedure: LATISSIMUS FLAP TO LEFT BREAST;  Surgeon: Wallace Going, DO;  Location: [place];  Service: Plastics;  Laterality: Left;  . LIPOSUCTION WITH LIPOFILLING Bilateral 05/20/2018   Procedure: Bilateral breast fat grafting from abdomen to improve symmetry following breast reconstruction;  Surgeon: Wallace Going, DO;  Location: [place];  Service: Plastics;  Laterality: Bilateral;  . MASTECTOMY W/ SENTINEL NODE BIOPSY Bilateral 04/20/2017   Procedure: BILATERAL TOTAL MASTECTOMY WITH LEFT AXILLARY SENTINEL LYMPH NODE BIOPSY;  Surgeon: [name], MD;  Location: [place];  Service: General;  Laterality: Bilateral;  . PORTA CATH REMOVAL Right 11/04/2017   Procedure: PORTA CATH REMOVAL;  Surgeon: Wallace Going, DO;  Location: [place];  Service: Plastics;  Laterality: Right;  . PORTACATH PLACEMENT Right 04/25/2015   Procedure: INSERTION PORT-A-CATH;  Surgeon: [name], MD;  Location: [place];  Service: General;  Laterality: Right;, renoved  in 2016  . PORTACATH PLACEMENT Right 04/20/2017   Procedure: INSERTION PORT-A-CATH;  Surgeon: [name], MD;  Location: [place];  Service: General;  Laterality: Right;  . RE-EXCISION OF BREAST LUMPECTOMY Left  04/05/2015   Procedure: RE-EXCISION LEFT  BREAST LUMPECTOMY;  Surgeon: [name], MD;  Location: [place];  Service: General;  Laterality: Left;  . REMOVAL OF TISSUE EXPANDER AND PLACEMENT OF IMPLANT Bilateral 11/04/2017   Procedure: BILATERAL REMOVAL OF TISSUE EXPANDER AND PLACEMENT OF  BILATERAL BREAST IMPLANT;  Surgeon: Wallace Going, DO;  Location: [place];  Service: Plastics;  Laterality: Bilateral;  . REMOVAL OF TISSUE EXPANDER AND PLACEMENT OF IMPLANT Left 12/28/2018   Procedure: Removal of left breast expander and placement of silicone implant.;  Surgeon: Wallace Going, DO;  Location: [place];  Service: Plastics;  Laterality: Left;  . TISSUE EXPANDER PLACEMENT Left 09/15/2018   Procedure: PLACEMENT OF TISSUE EXPANDER;  Surgeon: Wallace Going, DO;  Location: [place];  Service: Plastics;  Laterality: Left;    FAMILY HISTORY Family History  Problem Relation Age of Onset  . Lung cancer Maternal Grandmother 23       non smoker  . Lung cancer Maternal Grandfather 33       non smoker worked in [employer]  . Cancer Paternal Grandfather 35       throat cancer ? smoker  . Cancer Cousin 41       throat cancer ? smoker   the patient's parents are living, in their mid-50s as of March 2016. The patient had no siblings. Both the patient's mother's parents died from lung cancer although they did not smoke. [place] and her maternal grandfather was a Ecologist. There is no history of breast or ovarian cancer in the family to her knowledge   GYNECOLOGIC HISTORY:  Patient's last menstrual period was 04/02/2015 (approximate). Menarche age 29, first live birth age 23. The patient is GX P3. [name] status post hysterectomy with bilateral salpingo-oophorectomy 09/08/2018.   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 Her first husband [name] is  a Freight forwarder.  They divorced in 2016.  Their daughter [name] lives in [place] where she works in Press photographer. Daughter Summer is a Research scientist (physical sciences).  Daughter Journalist, newspaper lives at home and is 46 years old as of JAN 2022.     ADVANCED DIRECTIVES: Not in place   HEALTH MAINTENANCE: Social History   Tobacco Use  . Smoking status: Never Smoker  . Smokeless tobacco: Never Used  Vaping Use  . Vaping Use: Never used  Substance Use Topics  . Alcohol use: Yes    Alcohol/week: 0.0 standard drinks    Comment: socially twice month  . Drug use: No     Colonoscopy:  PAP:  Bone density: 03/21/2018, T score of -1.4  Lipid panel:  Allergies  Allergen Reactions  . Adhesive [Tape] Other (See Comments)    Band Aids causes Skin blisters.  All other tape is okay to use    Current Outpatient Medications  Medication Sig Dispense Refill  . anastrozole (ARIMIDEX) 1 MG tablet Take 1 tablet (1 mg total) by mouth daily. 90 tablet 4  . b complex vitamins tablet Take 1 tablet by mouth daily.    Marland Kitchen doxycycline (VIBRA-TABS) 100 MG tablet Take 1 tablet (100 mg total) by mouth 2 (two) times daily. 20 tablet 0  .  Vitamin D, Ergocalciferol, (DRISDOL) 1.25 MG (50000 UNIT) CAPS capsule TAKE 1 CAPSULE (50,000 UNITS TOTAL) BY MOUTH EVERY 7 (SEVEN) DAYS. 12 capsule 4   No current facility-administered medications for this visit.    OBJECTIVE: White woman who appears stated  Vitals:   01/02/21 1125  BP: 133/76  Pulse: 87  Resp: 18  Temp: 97.9 F (36.6 C)  SpO2: 98%     Body mass index is 42.15 kg/m.    ECOG FS:1 - Symptomatic but completely ambulatory   Sclerae unicteric, EOMs intact Wearing a mask No cervical or supraclavicular adenopathy Lungs no rales or rhonchi Heart regular rate and rhythm Abd soft, nontender, positive bowel sounds MSK no focal spinal tenderness, no upper extremity lymphedema Neuro: nonfocal, well oriented, appropriate affect Breasts: Status post bilateral mastectomies, with TRAM reconstruction  of the left, implant placement on the right.  There is moderate asymmetry and she tells me further surgery is planned.  There is no evidence of local recurrence.  Both axillae are benign.  LAB RESULTS:  CMP     Component Value Date/Time   NA 141 07/19/2020 1004   NA 140 10/21/2017 1206   K 4.2 07/19/2020 1004   K 4.0 10/21/2017 1206   CL 104 07/19/2020 1004   CO2 22 07/19/2020 1004   CO2 24 10/21/2017 1206   GLUCOSE 105 (H) 07/19/2020 1004   GLUCOSE 126 (H) 12/25/2019 0838   GLUCOSE 98 10/21/2017 1206   BUN 9 07/19/2020 1004   BUN 6.5 (L) 10/21/2017 1206   CREATININE 0.76 07/19/2020 1004   CREATININE 0.87 12/22/2018 0834   CREATININE 0.9 10/21/2017 1206   CALCIUM 9.6 07/19/2020 1004   CALCIUM 9.8 10/21/2017 1206   PROT 7.3 07/19/2020 1004   PROT 7.5 10/21/2017 1206   ALBUMIN 4.5 07/19/2020 1004   ALBUMIN 3.9 10/21/2017 1206   AST 21 07/19/2020 1004   AST 16 12/22/2018 0834   AST 32 10/21/2017 1206   ALT 18 07/19/2020 1004   ALT 21 12/22/2018 0834   ALT 37 10/21/2017 1206   ALKPHOS 106 07/19/2020 1004   ALKPHOS 105 10/21/2017 1206   BILITOT 0.7 07/19/2020 1004   BILITOT 0.8 12/22/2018 0834   BILITOT 0.64 10/21/2017 1206   GFRNONAA 95 07/19/2020 1004   GFRNONAA >60 12/22/2018 0834   GFRAA 110 07/19/2020 1004   GFRAA >60 12/22/2018 0834    INo results found for: SPEP, UPEP  Lab Results  Component Value Date   WBC 6.9 01/02/2021   NEUTROABS 3.9 01/02/2021   HGB 13.4 01/02/2021   HCT 40.3 01/02/2021   MCV 90.2 01/02/2021   PLT 357 01/02/2021      Chemistry      Component Value Date/Time   NA 141 07/19/2020 1004   NA 140 10/21/2017 1206   K 4.2 07/19/2020 1004   K 4.0 10/21/2017 1206   CL 104 07/19/2020 1004   CO2 22 07/19/2020 1004   CO2 24 10/21/2017 1206   BUN 9 07/19/2020 1004   BUN 6.5 (L) 10/21/2017 1206   CREATININE 0.76 07/19/2020 1004   CREATININE 0.87 12/22/2018 0834   CREATININE 0.9 10/21/2017 1206      Component Value Date/Time   CALCIUM  9.6 07/19/2020 1004   CALCIUM 9.8 10/21/2017 1206   ALKPHOS 106 07/19/2020 1004   ALKPHOS 105 10/21/2017 1206   AST 21 07/19/2020 1004   AST 16 12/22/2018 0834   AST 32 10/21/2017 1206   ALT 18 07/19/2020 1004   ALT 21  12/22/2018 0834   ALT 37 10/21/2017 1206   BILITOT 0.7 07/19/2020 1004   BILITOT 0.8 12/22/2018 0834   BILITOT 0.64 10/21/2017 1206       No results found for: LABCA2  No components found for: LABCA125  No results for input(s): INR in the last 168 hours.  Urinalysis    Component Value Date/Time   COLORURINE YELLOW 08/08/2018 1407   APPEARANCEUR CLEAR 08/08/2018 1407   LABSPEC 1.013 08/08/2018 1407   LABSPEC 1.020 05/30/2015 1502   PHURINE 6.0 08/08/2018 1407   GLUCOSEU NEGATIVE 08/08/2018 1407   GLUCOSEU Negative 05/30/2015 1502   HGBUR SMALL (A) 08/08/2018 1407   BILIRUBINUR negative 01/24/2020 0927   BILIRUBINUR negative 03/09/2019 0923   BILIRUBINUR Negative 05/30/2015 1502   KETONESUR negative 01/24/2020 0927   KETONESUR NEGATIVE 08/08/2018 1407   PROTEINUR negative 01/24/2020 0927   PROTEINUR Negative 03/09/2019 0923   PROTEINUR NEGATIVE 08/08/2018 1407   UROBILINOGEN 0.2 01/24/2020 0927   UROBILINOGEN 0.2 05/30/2015 1502   NITRITE Positive (A) 01/24/2020 0927   NITRITE negative 03/09/2019 0923   NITRITE NEGATIVE 08/08/2018 1407   LEUKOCYTESUR Small (1+) (A) 01/24/2020 0927   LEUKOCYTESUR Negative 05/30/2015 1502    STUDIES: No results found.    ASSESSMENT: 45 y.o. [place] woman status post left breast upper outer quadrant biopsy 02/08/2015 for ductal carcinoma in situ, grade 2 or 3, strongly estrogen and progesterone receptor positive, with likely areas of microinvasion  (a) biopsy of an area in the left breast upper outer quadrant showed PASH  (b) biopsy of 2 additional questionable areas, one in each breast, showed bilateral fibroadenomas  (1) genetics testing March 2016 through the BreastNext gene panel offered by [employer]  showed no deleterious mutations in ATM, BARD1, BRCA1, BRCA2, BRIP1, CDH1, CHEK2, MRE11A, MUTYH, NBN, NF1, PALB2, PTEN, RAD50, RAD51C, RAD51D, and TP53.  (2) status post left lumpectomy with sentinel lymph node sampling 03/27/2015 for a pT1c pN0, stage IA invasive ductal carcinoma, grade 3, triple negative, with an MIB-1 of 33%  (a) close margins were cleared with subsequent excision 04/05/2015.  (3) Oncotype DX score of 38 predicts a risk of 26% outside the breast recurrence within 10 years if the patient's only systemic treatment is tamoxifen for 5 years  (4) adjuvant chemotherapy started 05/02/2015 consisting of doxorubicin and cyclophosphamide in dose dense fashion 4, completed 06/13/2015, followed by paclitaxel weekly 12.   (a) Paclitaxel stopped after only 2 cycles because of persistent neuropathy.  (5) adjuvant radiation 08/19/15-10/02/15 Site/dose:   Left breast/ 45 Gy at 1.8 Gy per fraction x 25 fractions.  Left breast boost/ 16 Gy at 2 Gy per fraction x 8 fractions  (5) tamoxifen started 02/20/2015 (neoadjuvantly), discontinued 04/19/2015 so as not to overlap chemotherapy; resumed 02/07/2016, discontinued April 2018 with disease recurrence  RECURRENT DISEASE: April 2018 (6) left breast lower outer quadrant biopsy 03/05/2017 shows invasive ductal carcinoma, grade 2, triple negative  (7) status post bilateral mastectomies and left axillary lymph node dissection 04/20/2017 showing a residual left pT2 pN0 (8 nodes removed) invasive ductal carcinoma, grade 3, with negative margins; the right breast was benign  (a) she underwent expander exchange for bilateral gel implants with capsulectomies 11/04/2017  (b) status post implant extrusion on the left, with latissimus reconstruction 09/15/2018  (c) definitive implant and wound revision Left 12/28/2018 with a Mentor Smooth Round High Profile Xtra Gel 380cc. Ref #[id number].  Serial Number [id number]  (8) chemotherapy for her recurrence  consisting of cyclophosphamide, methotrexate and fluorouracil (  CMF) given every 21 days 8, started 05/13/2017  (a) eighth cycle omitted because of intercurrent plastic surgery and complications  (9) status post cholecystectomy 07/07/2017, with benign pathology  (10) anastrozole started 12/27/2017  (a) bone density scan 03/21/2018 shows a T score of -1.4  (b) bone density 10/23/2020 shows a T score of -0.3 (normal)  (11) status post hysterectomy with bilateral salpingo-oophorectomy 09/08/2018, with benign pathology   PLAN Victor will soon be 4 years out from definitive surgery for her breast cancer with no evidence of disease recurrence.  This is very favorable.  [name] continuing to do some work on her reconstruction and appears satisfied with that  [name] tolerating anastrozole generally well.  The plan is to continue it through January 2024, at which point she will be ready to "graduate".  Her bone density is very favorable.  I commended her exercise program  She will see [place] again in 1 year.  She knows to call for any other issue that may develop before then  Total encounter time 25 minutes.*   Khamia Stambaugh, Virgie Dad, MD  01/02/21 11:52 AM Medical Oncology and Hematology [facility] [place], [place] [address] Tel. [phone]    Fax. [phone]   I, Wilburn Mylar, am acting as scribe for Dr. Virgie Dad. Borden Thune.  I, Lurline Del MD, have reviewed the above documentation for accuracy and completeness, and I agree with the above.   *Total Encounter Time as defined by the Centers for Medicare and Medicaid Services includes, in addition to the face-to-face time of a patient visit (documented in the note above) non-face-to-face time: obtaining and reviewing outside history, ordering and reviewing medications, tests or procedures, care coordination (communications with other health care professionals or caregivers) and documentation in the  medical record.

## 2021-01-02 ENCOUNTER — Other Ambulatory Visit: Payer: Self-pay

## 2021-01-02 ENCOUNTER — Inpatient Hospital Stay: Payer: 59 | Attending: Oncology

## 2021-01-02 ENCOUNTER — Inpatient Hospital Stay: Payer: 59 | Admitting: Oncology

## 2021-01-02 VITALS — BP 133/76 | HR 87 | Temp 97.9°F | Resp 18 | Ht 61.0 in | Wt 223.1 lb

## 2021-01-02 DIAGNOSIS — Z9013 Acquired absence of bilateral breasts and nipples: Secondary | ICD-10-CM | POA: Diagnosis not present

## 2021-01-02 DIAGNOSIS — C50912 Malignant neoplasm of unspecified site of left female breast: Secondary | ICD-10-CM

## 2021-01-02 DIAGNOSIS — Z9049 Acquired absence of other specified parts of digestive tract: Secondary | ICD-10-CM | POA: Insufficient documentation

## 2021-01-02 DIAGNOSIS — Z79811 Long term (current) use of aromatase inhibitors: Secondary | ICD-10-CM | POA: Diagnosis not present

## 2021-01-02 DIAGNOSIS — Z171 Estrogen receptor negative status [ER-]: Secondary | ICD-10-CM | POA: Diagnosis not present

## 2021-01-02 DIAGNOSIS — C50412 Malignant neoplasm of upper-outer quadrant of left female breast: Secondary | ICD-10-CM | POA: Diagnosis not present

## 2021-01-02 DIAGNOSIS — Z90710 Acquired absence of both cervix and uterus: Secondary | ICD-10-CM | POA: Insufficient documentation

## 2021-01-02 DIAGNOSIS — Z9221 Personal history of antineoplastic chemotherapy: Secondary | ICD-10-CM | POA: Insufficient documentation

## 2021-01-02 DIAGNOSIS — Z801 Family history of malignant neoplasm of trachea, bronchus and lung: Secondary | ICD-10-CM | POA: Insufficient documentation

## 2021-01-02 DIAGNOSIS — Z9079 Acquired absence of other genital organ(s): Secondary | ICD-10-CM | POA: Insufficient documentation

## 2021-01-02 DIAGNOSIS — Z90722 Acquired absence of ovaries, bilateral: Secondary | ICD-10-CM | POA: Insufficient documentation

## 2021-01-02 DIAGNOSIS — Z170 Estrogen receptor positive status [ER+]: Secondary | ICD-10-CM | POA: Diagnosis not present

## 2021-01-02 DIAGNOSIS — Z923 Personal history of irradiation: Secondary | ICD-10-CM | POA: Diagnosis not present

## 2021-01-02 DIAGNOSIS — E7800 Pure hypercholesterolemia, unspecified: Secondary | ICD-10-CM

## 2021-01-02 DIAGNOSIS — C50512 Malignant neoplasm of lower-outer quadrant of left female breast: Secondary | ICD-10-CM | POA: Diagnosis not present

## 2021-01-02 LAB — COMPREHENSIVE METABOLIC PANEL
ALT: 32 U/L (ref 0–44)
AST: 28 U/L (ref 15–41)
Albumin: 4.1 g/dL (ref 3.5–5.0)
Alkaline Phosphatase: 94 U/L (ref 38–126)
Anion gap: 11 (ref 5–15)
BUN: 12 mg/dL (ref 6–20)
CO2: 23 mmol/L (ref 22–32)
Calcium: 9.3 mg/dL (ref 8.9–10.3)
Chloride: 105 mmol/L (ref 98–111)
Creatinine, Ser: 0.84 mg/dL (ref 0.44–1.00)
GFR, Estimated: >60 mL/min (ref >60)
Glucose, Bld: 99 mg/dL (ref 70–99)
Potassium: 4 mmol/L (ref 3.5–5.1)
Sodium: 139 mmol/L (ref 135–145)
Total Bilirubin: 0.9 mg/dL (ref 0.3–1.2)
Total Protein: 7.7 g/dL (ref 6.5–8.1)

## 2021-01-02 LAB — CBC WITH DIFFERENTIAL/PLATELET
Abs Immature Granulocytes: 0.02 10*3/uL (ref 0.00–0.07)
Basophils Absolute: 0.1 10*3/uL (ref 0.0–0.1)
Basophils Relative: 1 %
Eosinophils Absolute: 0.2 10*3/uL (ref 0.0–0.5)
Eosinophils Relative: 2 %
HCT: 40.3 % (ref 36.0–46.0)
Hemoglobin: 13.4 g/dL (ref 12.0–15.0)
Immature Granulocytes: 0 %
Lymphocytes Relative: 33 %
Lymphs Abs: 2.3 10*3/uL (ref 0.7–4.0)
MCH: 30 pg (ref 26.0–34.0)
MCHC: 33.3 g/dL (ref 30.0–36.0)
MCV: 90.2 fL (ref 80.0–100.0)
Monocytes Absolute: 0.5 10*3/uL (ref 0.1–1.0)
Monocytes Relative: 7 %
Neutro Abs: 3.9 10*3/uL (ref 1.7–7.7)
Neutrophils Relative %: 57 %
Platelets: 357 10*3/uL (ref 150–400)
RBC: 4.47 MIL/uL (ref 3.87–5.11)
RDW: 12.4 % (ref 11.5–15.5)
WBC: 6.9 10*3/uL (ref 4.0–10.5)
nRBC: 0 % (ref 0.0–0.2)

## 2021-01-03 ENCOUNTER — Telehealth: Payer: Self-pay | Admitting: Oncology

## 2021-01-03 NOTE — Telephone Encounter (Signed)
Scheduled appts per 2/10 los. Left voicemail with appt date and time.

## 2021-02-07 ENCOUNTER — Encounter: Payer: Self-pay | Admitting: Plastic Surgery

## 2021-02-07 ENCOUNTER — Other Ambulatory Visit: Payer: Self-pay

## 2021-02-07 ENCOUNTER — Ambulatory Visit: Payer: 59 | Admitting: Plastic Surgery

## 2021-02-07 VITALS — BP 138/85 | HR 79

## 2021-02-07 DIAGNOSIS — N651 Disproportion of reconstructed breast: Secondary | ICD-10-CM

## 2021-02-07 NOTE — Progress Notes (Signed)
   Subjective:    Patient ID: Cheryl [name], female    DOB: 1976-04-06, 45 y.o.   MRN: [id number]  The patient is a 45 year old female here for further discussion about breast reconstruction.  As a review she had left breast cancer several years ago with radiation after lumpectomy.  She then decided to have bilateral mastectomies due to a second breast cancer.  She had some seroma and due to the radiation required a latissimus muscle flap.  Currently she has a Product manager ultra high profile 590 cc implant and on the right and a Mentor high-profile extra gel 380 cc implant and on the left.  She has had fat grafting in the past which helped with some of the asymmetry.  She has gained some weight which has affected her overall results.  [name] steady right now.  [name] not smoking.  On exam she is doing very well and does not have any concerning signs.  There is some laxity of the muscle flap on the left.  She has a little bit of extra tissue laterally on both sides.  She has a little bit of volume loss in the superior poles on both sides.  She has broke up with her fianc and this has been a little bit challenging for her.  She was thinking about the possibility of trying to make the implants bigger.     Review of Systems  Constitutional: Negative.   HENT: Negative.   Eyes: Negative.   Respiratory: Negative.   Cardiovascular: Negative.   Gastrointestinal: Negative.   Genitourinary: Negative.   Musculoskeletal: Negative.        Objective:   Physical Exam Vitals and nursing note reviewed.  Constitutional:      Appearance: Normal appearance.  HENT:     Head: Normocephalic and atraumatic.  Cardiovascular:     Rate and Rhythm: Normal rate.     Pulses: Normal pulses.  Pulmonary:     Effort: Pulmonary effort is normal.  Neurological:     General: No focal deficit present.     Mental Status: [name] alert and oriented to person, place, and time.  Psychiatric:        Mood and Affect:  Mood normal.        Behavior: Behavior normal.        Assessment & Plan:     ICD-10-CM   1. Breast asymmetry following reconstructive surgery  N65.1      Plan for bilateral grafting of the implants.  Liposuction laterally for improved contour and excision and tightening of the latissimus muscle flap on the left breast.  Pictures were obtained of the patient and placed in the chart with the patient's or guardian's permission.

## 2021-02-20 NOTE — Progress Notes (Signed)
Patient ID: Cheryl [name], female    DOB: [birth year], 44 y.o.   MRN: [id number]  Chief Complaint  Patient presents with  . Pre-op Exam      ICD-10-CM   1. Breast asymmetry following reconstructive surgery  N65.1   2. S/P mastectomy, bilateral  Z90.13   3. S/P breast reconstruction  Z98.890     History of Present Illness: Cheryl [name] is a 45 y.o.  female  with a history of left breast cancer several years ago with radiation with subsequent bilateral mastectomies and subsequent left latissimus muscle flap.  She presents for preoperative evaluation for upcoming procedure, lipofilling and liposuction for improved symmetry and revision of left breast flap, scheduled for 03/06/21 with Dr. Marla Roe.  The patient has not had problems with anesthesia. No history of DVT/PE.  No family history of DVT/PE.  No family or personal history of bleeding or clotting disorders.  Patient is not currently taking any blood thinners.  No history of CVA/MI.   Summary of Previous Visit: Patient currently has a Mentor ultra high profile 590 cc implant on the right and a Mentor high-profile extra gel 380 cc on the left.  She has had fat grafting in the past which helped with some of the asymmetry.  She has a history of radiation to the left breast as well as a latissimus muscle flap.  [name] not smoking.  There is some laxity of muscle flap on the left.  She has a little bit of extra tissue laterally on both sides.  She has volume loss in the superior poles on both sides.  Patient is currently on anastrozole. History of diabetes mellitus with most recent A1c 6.0 on 07/12/2020.  The patient gave consent to have this visit done by telemedicine / virtual visit, two identifiers were used to identify patient. This is also consent for access the chart and treat the patient via this visit. The patient is located at work.  I, the provider, am at the office.  We spent 10 minutes together for the  visit.  Joined by telephone.   Past Medical History: Allergies: Allergies  Allergen Reactions  . Adhesive [Tape] Other (See Comments)    Band Aids causes Skin blisters.  All other tape is okay to use    Current Medications:  Current Outpatient Medications:  .  anastrozole (ARIMIDEX) 1 MG tablet, Take 1 tablet (1 mg total) by mouth daily., Disp: 90 tablet, Rfl: 4 .  b complex vitamins tablet, Take 1 tablet by mouth daily., Disp: , Rfl:  .  Vitamin D, Ergocalciferol, (DRISDOL) 1.25 MG (50000 UNIT) CAPS capsule, TAKE 1 CAPSULE (50,000 UNITS TOTAL) BY MOUTH EVERY 7 (SEVEN) DAYS., Disp: 12 capsule, Rfl: 4  Past Medical Problems: Past Medical History:  Diagnosis Date  . Breast cancer ([place]) 2016 and 2018   left breast, surgery and chemo done 2018  . Cellulitis 07/25/2018   left breast  . Pre-diabetes   . SVD (spontaneous vaginal delivery)    x 3  . Wears contact lenses     Past Surgical History: Past Surgical History:  Procedure Laterality Date  . ABDOMINAL HYSTERECTOMY    . BILATERAL TOTAL MASTECTOMY WITH AXILLARY LYMPH NODE DISSECTION  04/20/2017  . BREAST BIOPSY Left 02/18/2015  . BREAST BIOPSY Right 02/18/2015  . BREAST EXCISIONAL BIOPSY Left 04/05/2015  . BREAST IMPLANT REMOVAL Left 09/15/2018   Procedure: REMOVAL BREAST IMPLANTS;  Surgeon: Wallace Going, DO;  Location: [facility];  Service: Clinical cytogeneticist;  Laterality: Left;  . BREAST LUMPECTOMY Left 03/27/2015  . BREAST LUMPECTOMY WITH NEEDLE LOCALIZATION AND AXILLARY SENTINEL LYMPH NODE BX Left 03/27/2015   Procedure: LEFT BREAST LUMPECTOMY WITH BRACKETED NEEDLE LOCALIZATION AND LEFT  AXILLARY SENTINEL LYMPH NODE BX;  Surgeon: [name], MD;  Location: [place];  Service: General;  Laterality: Left;  . BREAST RECONSTRUCTION WITH PLACEMENT OF TISSUE EXPANDER AND FLEX HD (ACELLULAR HYDRATED DERMIS) Bilateral 04/20/2017   Procedure: BILATARAL BREAST RECONSTRUCTION WITH PLACEMENT OF TISSUE EXPANDER AND FLEX  HD (ACELLULAR HYDRATED DERMIS);  Surgeon: Wallace Going, DO;  Location: [place];  Service: Plastics;  Laterality: Bilateral;  . BREAST REDUCTION SURGERY Right 12/28/2018   Procedure: Liposuction of medial right breast for improved symmetry;  Surgeon: Wallace Going, DO;  Location: [place];  Service: Plastics;  Laterality: Right;  . BREAST SURGERY Bilateral    masectomy  . CHOLECYSTECTOMY N/A 07/07/2017   Procedure: LAPAROSCOPIC CHOLECYSTECTOMY WITH INTRAOPERATIVE CHOLANGIOGRAM;  Surgeon: [name], MD;  [facility];  Service: General;  Laterality: N/A;  . Insertion of Essure  yrs ago   For Birth control  . LAPAROSCOPIC VAGINAL HYSTERECTOMY WITH SALPINGO OOPHORECTOMY Bilateral 09/08/2018   Procedure: LAPAROSCOPIC ASSISTED VAGINAL HYSTERECTOMY WITH SALPINGO OOPHORECTOMY;  Surgeon: [name], MD;  Location: [place];  Service: Gynecology;  Laterality: Bilateral;  . LATISSIMUS FLAP TO BREAST Left 09/15/2018   Procedure: LATISSIMUS FLAP TO LEFT BREAST;  Surgeon: Wallace Going, DO;  Location: [place];  Service: Plastics;  Laterality: Left;  . LIPOSUCTION WITH LIPOFILLING Bilateral 05/20/2018   Procedure: Bilateral breast fat grafting from abdomen to improve symmetry following breast reconstruction;  Surgeon: Wallace Going, DO;  Location: [place];  Service: Plastics;  Laterality: Bilateral;  . MASTECTOMY W/ SENTINEL NODE BIOPSY Bilateral 04/20/2017   Procedure: BILATERAL TOTAL MASTECTOMY WITH LEFT AXILLARY SENTINEL LYMPH NODE BIOPSY;  Surgeon: [name], MD;  Location: [place];  Service: General;  Laterality: Bilateral;  . PORTA CATH REMOVAL Right 11/04/2017   Procedure: PORTA CATH REMOVAL;  Surgeon: Wallace Going, DO;  Location: [place];  Service: Plastics;  Laterality: Right;  . PORTACATH PLACEMENT Right 04/25/2015   Procedure: INSERTION PORT-A-CATH;  Surgeon: [name],  MD;  Location: [place];  Service: General;  Laterality: Right;, renoved in 2016  . PORTACATH PLACEMENT Right 04/20/2017   Procedure: INSERTION PORT-A-CATH;  Surgeon: [name], MD;  Location: [place];  Service: General;  Laterality: Right;  . RE-EXCISION OF BREAST LUMPECTOMY Left 04/05/2015   Procedure: RE-EXCISION LEFT  BREAST LUMPECTOMY;  Surgeon: [name], MD;  Location: [place];  Service: General;  Laterality: Left;  . REMOVAL OF TISSUE EXPANDER AND PLACEMENT OF IMPLANT Bilateral 11/04/2017   Procedure: BILATERAL REMOVAL OF TISSUE EXPANDER AND PLACEMENT OF  BILATERAL BREAST IMPLANT;  Surgeon: Wallace Going, DO;  Location: [place];  Service: Plastics;  Laterality: Bilateral;  . REMOVAL OF TISSUE EXPANDER AND PLACEMENT OF IMPLANT Left 12/28/2018   Procedure: Removal of left breast expander and placement of silicone implant.;  Surgeon: Wallace Going, DO;  Location: [place];  Service: Plastics;  Laterality: Left;  . TISSUE EXPANDER PLACEMENT Left 09/15/2018   Procedure: PLACEMENT OF TISSUE EXPANDER;  Surgeon: Wallace Going, DO;  Location: [place];  Service: Plastics;  Laterality: Left;    Social History: Social History   Socioeconomic History  . Marital status: Divorced  Spouse name: Not on file  . Number of children: 3  . Years of education: Not on file  . Highest education level: Not on file  Occupational History  . Not on file  Tobacco Use  . Smoking status: Never Smoker  . Smokeless tobacco: Never Used  Vaping Use  . Vaping Use: Never used  Substance and Sexual Activity  . Alcohol use: Yes    Alcohol/week: 0.0 standard drinks    Comment: socially twice month  . Drug use: No  . Sexual activity: Yes    Birth control/protection: Surgical    Comment: Essure   Other Topics Concern  . Not on file  Social History Narrative  . Not on file   Social Determinants of Health    Financial Resource Strain: Not on file  Food Insecurity: Not on file  Transportation Needs: Not on file  Physical Activity: Not on file  Stress: Not on file  Social Connections: Not on file  Intimate Partner Violence: Not on file    Family History: Family History  Problem Relation Age of Onset  . Lung cancer Maternal Grandmother 23       non smoker  . Lung cancer Maternal Grandfather 56       non smoker worked in [employer]  . Cancer Paternal Grandfather 35       throat cancer ? smoker  . Cancer Cousin 41       throat cancer ? smoker    Review of Systems: Review of Systems  Constitutional: Negative.   Respiratory: Negative.   Cardiovascular: Negative.   Gastrointestinal: Negative.   Neurological: Negative.     Physical Exam: Vital Signs LMP 04/02/2015 (Approximate) Comment: after chemo - no periods   Assessment/Plan: The patient is scheduled for liposuction and Lipo filling of bilateral breasts and revision of left breast flap with Dr. Marla Roe.  Risks, benefits, and alternatives of procedure discussed, questions answered and consent obtained.    Smoking Status: non smoker; Counseling Given? n/a Last Mammogram: Status post bilateral mastectomies  Caprini Score: 7, high; Risk Factors include: Age, BMI rater than 25, history of breast cancer and length of planned surgery. Recommendation for mechanical and pharmacological prophylaxis. Encourage early ambulation.   Pictures obtained: 02/07/2021  Post-op Rx sent to pharmacy: Norco, Zofran, Keflex  Patient was previously provided with a general surgical risk consent document and pain medication agreement.  She has underwent surgical intervention with our office multiple times.  She reported today that she is aware of the risks and we discussed the specific risks related to her surgery today.  All of her questions were answered to her content.  The risks that can be encountered with and after liposuction were discussed  and include the following but no limited to these:  Asymmetry, fluid accumulation, firmness of the area, fat necrosis with death of fat tissue, bleeding, infection, delayed healing, anesthesia risks, skin sensation changes, injury to structures including nerves, blood vessels, and muscles which may be temporary or permanent, allergies to tape, suture materials and glues, blood products, topical preparations or injected agents, skin and contour irregularities, skin discoloration and swelling, deep vein thrombosis, cardiac and pulmonary complications, pain, which may persist, persistent pain, recurrence of the lesion, poor healing of the incision, possible need for revisional surgery or staged procedures. Thiere can also be persistent swelling, poor wound healing, rippling or loose skin, worsening of cellulite, swelling, and thermal burn or heat injury from ultrasound with the ultrasound-assisted lipoplasty technique. Any change  in weight fluctuations can alter the outcome.  We discussed the risks of excision of lateral breast tissue near latissimus flap.  We discussed risks including but not limited to wounds, delayed healing, damage to surrounding structures, infection, bleeding  Patient reports that she feels very comfortable moving forward with surgery.  She has underwent multiple breast surgeries.  Electronically signed by: Carola Rhine Geraldina Parrott, PA-C 02/21/2021 9:48 AM

## 2021-02-20 NOTE — H&P (View-Only) (Signed)
Patient ID: Cheryl [name], female    DOB: [birth year], 45 y.o.   MRN: [id number]  Chief Complaint  Patient presents with  . Pre-op Exam      ICD-10-CM   1. Breast asymmetry following reconstructive surgery  N65.1   2. S/P mastectomy, bilateral  Z90.13   3. S/P breast reconstruction  Z98.890     History of Present Illness: Cheryl [name] is a 45 y.o.  female  with a history of left breast cancer several years ago with radiation with subsequent bilateral mastectomies and subsequent left latissimus muscle flap.  She presents for preoperative evaluation for upcoming procedure, lipofilling and liposuction for improved symmetry and revision of left breast flap, scheduled for 03/06/21 with Dr. Marla Roe.  The patient has not had problems with anesthesia. No history of DVT/PE.  No family history of DVT/PE.  No family or personal history of bleeding or clotting disorders.  Patient is not currently taking any blood thinners.  No history of CVA/MI.   Summary of Previous Visit: Patient currently has a Mentor ultra high profile 590 cc implant on the right and a Mentor high-profile extra gel 380 cc on the left.  She has had fat grafting in the past which helped with some of the asymmetry.  She has a history of radiation to the left breast as well as a latissimus muscle flap.  [name] not smoking.  There is some laxity of muscle flap on the left.  She has a little bit of extra tissue laterally on both sides.  She has volume loss in the superior poles on both sides.  Patient is currently on anastrozole. History of diabetes mellitus with most recent A1c 6.0 on 07/12/2020.  The patient gave consent to have this visit done by telemedicine / virtual visit, two identifiers were used to identify patient. This is also consent for access the chart and treat the patient via this visit. The patient is located at work.  I, the provider, am at the office.  We spent 10 minutes together for the  visit.  Joined by telephone.   Past Medical History: Allergies: Allergies  Allergen Reactions  . Adhesive [Tape] Other (See Comments)    Band Aids causes Skin blisters.  All other tape is okay to use    Current Medications:  Current Outpatient Medications:  .  anastrozole (ARIMIDEX) 1 MG tablet, Take 1 tablet (1 mg total) by mouth daily., Disp: 90 tablet, Rfl: 4 .  b complex vitamins tablet, Take 1 tablet by mouth daily., Disp: , Rfl:  .  Vitamin D, Ergocalciferol, (DRISDOL) 1.25 MG (50000 UNIT) CAPS capsule, TAKE 1 CAPSULE (50,000 UNITS TOTAL) BY MOUTH EVERY 7 (SEVEN) DAYS., Disp: 12 capsule, Rfl: 4  Past Medical Problems: Past Medical History:  Diagnosis Date  . Breast cancer ([place]) 2016 and 2018   left breast, surgery and chemo done 2018  . Cellulitis 07/25/2018   left breast  . Pre-diabetes   . SVD (spontaneous vaginal delivery)    x 3  . Wears contact lenses     Past Surgical History: Past Surgical History:  Procedure Laterality Date  . ABDOMINAL HYSTERECTOMY    . BILATERAL TOTAL MASTECTOMY WITH AXILLARY LYMPH NODE DISSECTION  04/20/2017  . BREAST BIOPSY Left 02/18/2015  . BREAST BIOPSY Right 02/18/2015  . BREAST EXCISIONAL BIOPSY Left 04/05/2015  . BREAST IMPLANT REMOVAL Left 09/15/2018   Procedure: REMOVAL BREAST IMPLANTS;  Surgeon: Wallace Going, DO;  Location: [facility];  Service: Clinical cytogeneticist;  Laterality: Left;  . BREAST LUMPECTOMY Left 03/27/2015  . BREAST LUMPECTOMY WITH NEEDLE LOCALIZATION AND AXILLARY SENTINEL LYMPH NODE BX Left 03/27/2015   Procedure: LEFT BREAST LUMPECTOMY WITH BRACKETED NEEDLE LOCALIZATION AND LEFT  AXILLARY SENTINEL LYMPH NODE BX;  Surgeon: [name], MD;  Location: [place];  Service: General;  Laterality: Left;  . BREAST RECONSTRUCTION WITH PLACEMENT OF TISSUE EXPANDER AND FLEX HD (ACELLULAR HYDRATED DERMIS) Bilateral 04/20/2017   Procedure: BILATARAL BREAST RECONSTRUCTION WITH PLACEMENT OF TISSUE EXPANDER AND FLEX  HD (ACELLULAR HYDRATED DERMIS);  Surgeon: Wallace Going, DO;  Location: [facility] [place];  Service: Plastics;  Laterality: Bilateral;  . BREAST REDUCTION SURGERY Right 12/28/2018   Procedure: Liposuction of medial right breast for improved symmetry;  Surgeon: Wallace Going, DO;  Location: [place];  Service: Plastics;  Laterality: Right;  . BREAST SURGERY Bilateral    masectomy  . CHOLECYSTECTOMY N/A 07/07/2017   Procedure: LAPAROSCOPIC CHOLECYSTECTOMY WITH INTRAOPERATIVE CHOLANGIOGRAM;  Surgeon: [name], MD;  [facility];  Service: General;  Laterality: N/A;  . Insertion of Essure  yrs ago   For Birth control  . LAPAROSCOPIC VAGINAL HYSTERECTOMY WITH SALPINGO OOPHORECTOMY Bilateral 09/08/2018   Procedure: LAPAROSCOPIC ASSISTED VAGINAL HYSTERECTOMY WITH SALPINGO OOPHORECTOMY;  Surgeon: [name], MD;  Location: [place];  Service: Gynecology;  Laterality: Bilateral;  . LATISSIMUS FLAP TO BREAST Left 09/15/2018   Procedure: LATISSIMUS FLAP TO LEFT BREAST;  Surgeon: Wallace Going, DO;  Location: [facility] [facility];  Service: Plastics;  Laterality: Left;  . LIPOSUCTION WITH LIPOFILLING Bilateral 05/20/2018   Procedure: Bilateral breast fat grafting from abdomen to improve symmetry following breast reconstruction;  Surgeon: Wallace Going, DO;  Location: [place];  Service: Plastics;  Laterality: Bilateral;  . MASTECTOMY W/ SENTINEL NODE BIOPSY Bilateral 04/20/2017   Procedure: BILATERAL TOTAL MASTECTOMY WITH LEFT AXILLARY SENTINEL LYMPH NODE BIOPSY;  Surgeon: [name], MD;  Location: [place];  Service: General;  Laterality: Bilateral;  . PORTA CATH REMOVAL Right 11/04/2017   Procedure: PORTA CATH REMOVAL;  Surgeon: Wallace Going, DO;  Location: [place];  Service: Plastics;  Laterality: Right;  . PORTACATH PLACEMENT Right 04/25/2015   Procedure: INSERTION PORT-A-CATH;  Surgeon: [name],  MD;  Location: [place];  Service: General;  Laterality: Right;, renoved in 2016  . PORTACATH PLACEMENT Right 04/20/2017   Procedure: INSERTION PORT-A-CATH;  Surgeon: [name], MD;  Location: [place];  Service: General;  Laterality: Right;  . RE-EXCISION OF BREAST LUMPECTOMY Left 04/05/2015   Procedure: RE-EXCISION LEFT  BREAST LUMPECTOMY;  Surgeon: [name], MD;  Location: [place];  Service: General;  Laterality: Left;  . REMOVAL OF TISSUE EXPANDER AND PLACEMENT OF IMPLANT Bilateral 11/04/2017   Procedure: BILATERAL REMOVAL OF TISSUE EXPANDER AND PLACEMENT OF  BILATERAL BREAST IMPLANT;  Surgeon: Wallace Going, DO;  Location: [place];  Service: Plastics;  Laterality: Bilateral;  . REMOVAL OF TISSUE EXPANDER AND PLACEMENT OF IMPLANT Left 12/28/2018   Procedure: Removal of left breast expander and placement of silicone implant.;  Surgeon: Wallace Going, DO;  Location: [place];  Service: Plastics;  Laterality: Left;  . TISSUE EXPANDER PLACEMENT Left 09/15/2018   Procedure: PLACEMENT OF TISSUE EXPANDER;  Surgeon: Wallace Going, DO;  Location: [place];  Service: Plastics;  Laterality: Left;    Social History: Social History   Socioeconomic History  . Marital status: Divorced  Spouse name: Not on file  . Number of children: 3  . Years of education: Not on file  . Highest education level: Not on file  Occupational History  . Not on file  Tobacco Use  . Smoking status: Never Smoker  . Smokeless tobacco: Never Used  Vaping Use  . Vaping Use: Never used  Substance and Sexual Activity  . Alcohol use: Yes    Alcohol/week: 0.0 standard drinks    Comment: socially twice month  . Drug use: No  . Sexual activity: Yes    Birth control/protection: Surgical    Comment: Essure   Other Topics Concern  . Not on file  Social History Narrative  . Not on file   Social Determinants of Health    Financial Resource Strain: Not on file  Food Insecurity: Not on file  Transportation Needs: Not on file  Physical Activity: Not on file  Stress: Not on file  Social Connections: Not on file  Intimate Partner Violence: Not on file    Family History: Family History  Problem Relation Age of Onset  . Lung cancer Maternal Grandmother 23       non smoker  . Lung cancer Maternal Grandfather 42       non smoker worked in [employer]  . Cancer Paternal Grandfather 35       throat cancer ? smoker  . Cancer Cousin 41       throat cancer ? smoker    Review of Systems: Review of Systems  Constitutional: Negative.   Respiratory: Negative.   Cardiovascular: Negative.   Gastrointestinal: Negative.   Neurological: Negative.     Physical Exam: Vital Signs LMP 04/02/2015 (Approximate) Comment: after chemo - no periods   Assessment/Plan: The patient is scheduled for liposuction and Lipo filling of bilateral breasts and revision of left breast flap with Dr. Marla Roe.  Risks, benefits, and alternatives of procedure discussed, questions answered and consent obtained.    Smoking Status: non smoker; Counseling Given? n/a Last Mammogram: Status post bilateral mastectomies  Caprini Score: 7, high; Risk Factors include: Age, BMI rater than 25, history of breast cancer and length of planned surgery. Recommendation for mechanical and pharmacological prophylaxis. Encourage early ambulation.   Pictures obtained: 02/07/2021  Post-op Rx sent to pharmacy: Norco, Zofran, Keflex  Patient was previously provided with a general surgical risk consent document and pain medication agreement.  She has underwent surgical intervention with our office multiple times.  She reported today that she is aware of the risks and we discussed the specific risks related to her surgery today.  All of her questions were answered to her content.  The risks that can be encountered with and after liposuction were discussed  and include the following but no limited to these:  Asymmetry, fluid accumulation, firmness of the area, fat necrosis with death of fat tissue, bleeding, infection, delayed healing, anesthesia risks, skin sensation changes, injury to structures including nerves, blood vessels, and muscles which may be temporary or permanent, allergies to tape, suture materials and glues, blood products, topical preparations or injected agents, skin and contour irregularities, skin discoloration and swelling, deep vein thrombosis, cardiac and pulmonary complications, pain, which may persist, persistent pain, recurrence of the lesion, poor healing of the incision, possible need for revisional surgery or staged procedures. Thiere can also be persistent swelling, poor wound healing, rippling or loose skin, worsening of cellulite, swelling, and thermal burn or heat injury from ultrasound with the ultrasound-assisted lipoplasty technique. Any change  in weight fluctuations can alter the outcome.  We discussed the risks of excision of lateral breast tissue near latissimus flap.  We discussed risks including but not limited to wounds, delayed healing, damage to surrounding structures, infection, bleeding  Patient reports that she feels very comfortable moving forward with surgery.  She has underwent multiple breast surgeries.  Electronically signed by: Carola Rhine Brexlee Heberlein, PA-C 02/21/2021 9:48 AM

## 2021-02-21 ENCOUNTER — Ambulatory Visit (INDEPENDENT_AMBULATORY_CARE_PROVIDER_SITE_OTHER): Payer: 59 | Admitting: Surgical

## 2021-02-21 ENCOUNTER — Other Ambulatory Visit: Payer: Self-pay

## 2021-02-21 ENCOUNTER — Encounter: Payer: Self-pay | Admitting: Surgical

## 2021-02-21 DIAGNOSIS — Z9013 Acquired absence of bilateral breasts and nipples: Secondary | ICD-10-CM

## 2021-02-21 DIAGNOSIS — N651 Disproportion of reconstructed breast: Secondary | ICD-10-CM

## 2021-02-21 DIAGNOSIS — Z9889 Other specified postprocedural states: Secondary | ICD-10-CM

## 2021-02-21 MED ORDER — ONDANSETRON HCL 4 MG PO TABS: 4.0000 mg | ORAL_TABLET | Freq: Three times a day (TID) | ORAL | 0 refills | Status: DC | PRN

## 2021-02-21 MED ORDER — HYDROCODONE-ACETAMINOPHEN 7.5-325 MG PO TABS: 1.0000 | ORAL_TABLET | Freq: Four times a day (QID) | ORAL | 0 refills | Status: AC | PRN

## 2021-02-21 MED ORDER — CEPHALEXIN 500 MG PO CAPS: 500.0000 mg | ORAL_CAPSULE | Freq: Four times a day (QID) | ORAL | 0 refills | Status: AC

## 2021-02-27 ENCOUNTER — Other Ambulatory Visit: Payer: Self-pay

## 2021-02-27 ENCOUNTER — Encounter (HOSPITAL_BASED_OUTPATIENT_CLINIC_OR_DEPARTMENT_OTHER): Payer: Self-pay | Admitting: Plastic Surgery

## 2021-03-04 ENCOUNTER — Other Ambulatory Visit (HOSPITAL_COMMUNITY): Payer: 59

## 2021-03-05 ENCOUNTER — Other Ambulatory Visit: Payer: Self-pay

## 2021-03-05 ENCOUNTER — Other Ambulatory Visit
Admission: RE | Admit: 2021-03-05 | Discharge: 2021-03-05 | Disposition: A | Discharge status: Home / Self Care | Payer: 59 | Source: Ambulatory Visit | Attending: Plastic Surgery | Admitting: Plastic Surgery

## 2021-03-05 DIAGNOSIS — Z20822 Contact with and (suspected) exposure to covid-19: Secondary | ICD-10-CM | POA: Insufficient documentation

## 2021-03-05 DIAGNOSIS — Z01812 Encounter for preprocedural laboratory examination: Secondary | ICD-10-CM | POA: Insufficient documentation

## 2021-03-05 LAB — SARS CORONAVIRUS 2 (TAT 6-24 HRS): SARS Coronavirus 2: NEGATIVE

## 2021-03-05 NOTE — Progress Notes (Signed)
Text message sent reminding patient of COVID test.

## 2021-03-06 ENCOUNTER — Encounter (HOSPITAL_BASED_OUTPATIENT_CLINIC_OR_DEPARTMENT_OTHER)
Admission: RE | Disposition: A | Discharge status: Home / Self Care | Payer: Self-pay | Source: Home / Self Care | Attending: Plastic Surgery

## 2021-03-06 ENCOUNTER — Ambulatory Visit (HOSPITAL_BASED_OUTPATIENT_CLINIC_OR_DEPARTMENT_OTHER): Payer: 59 | Admitting: Anesthesiology

## 2021-03-06 ENCOUNTER — Ambulatory Visit (HOSPITAL_BASED_OUTPATIENT_CLINIC_OR_DEPARTMENT_OTHER)
Admission: RE | Admit: 2021-03-06 | Discharge: 2021-03-06 | Disposition: A | Discharge status: Home / Self Care | Payer: 59 | Attending: Plastic Surgery | Admitting: Plastic Surgery

## 2021-03-06 ENCOUNTER — Other Ambulatory Visit: Payer: Self-pay

## 2021-03-06 ENCOUNTER — Encounter (HOSPITAL_BASED_OUTPATIENT_CLINIC_OR_DEPARTMENT_OTHER): Payer: Self-pay | Admitting: Plastic Surgery

## 2021-03-06 DIAGNOSIS — Z79811 Long term (current) use of aromatase inhibitors: Secondary | ICD-10-CM | POA: Diagnosis not present

## 2021-03-06 DIAGNOSIS — E119 Type 2 diabetes mellitus without complications: Secondary | ICD-10-CM | POA: Diagnosis not present

## 2021-03-06 DIAGNOSIS — Z853 Personal history of malignant neoplasm of breast: Secondary | ICD-10-CM

## 2021-03-06 DIAGNOSIS — C50912 Malignant neoplasm of unspecified site of left female breast: Secondary | ICD-10-CM | POA: Insufficient documentation

## 2021-03-06 DIAGNOSIS — N62 Hypertrophy of breast: Secondary | ICD-10-CM | POA: Diagnosis not present

## 2021-03-06 DIAGNOSIS — Z888 Allergy status to other drugs, medicaments and biological substances status: Secondary | ICD-10-CM | POA: Insufficient documentation

## 2021-03-06 DIAGNOSIS — N651 Disproportion of reconstructed breast: Secondary | ICD-10-CM | POA: Diagnosis present

## 2021-03-06 DIAGNOSIS — Z9013 Acquired absence of bilateral breasts and nipples: Secondary | ICD-10-CM | POA: Diagnosis not present

## 2021-03-06 DIAGNOSIS — Z421 Encounter for breast reconstruction following mastectomy: Secondary | ICD-10-CM | POA: Diagnosis not present

## 2021-03-06 HISTORY — PX: LIPOSUCTION WITH LIPOFILLING: SHX6436

## 2021-03-06 HISTORY — PX: BREAST RECONSTRUCTION: SHX9

## 2021-03-06 SURGERY — LIPOSUCTION, WITH FAT TRANSFER
Anesthesia: General | Site: Chest | Laterality: Left

## 2021-03-06 MED ORDER — ACETAMINOPHEN 325 MG RE SUPP: 650.0000 mg | RECTAL | Status: DC | PRN

## 2021-03-06 MED ORDER — LIDOCAINE-EPINEPHRINE 1 %-1:100000 IJ SOLN
INTRAMUSCULAR | Status: DC | PRN
  Administered 2021-03-06: 11 mL via INTRADERMAL

## 2021-03-06 MED ORDER — CHLORHEXIDINE GLUCONATE CLOTH 2 % EX PADS: 6.0000 | MEDICATED_PAD | Freq: Once | CUTANEOUS | Status: DC

## 2021-03-06 MED ORDER — PROMETHAZINE HCL 25 MG/ML IJ SOLN: 6.2500 mg | INTRAMUSCULAR | Status: DC | PRN

## 2021-03-06 MED ORDER — ONDANSETRON HCL 4 MG/2ML IJ SOLN
INTRAMUSCULAR | Status: AC
  Filled 2021-03-06: qty 2

## 2021-03-06 MED ORDER — ROCURONIUM BROMIDE 100 MG/10ML IV SOLN
INTRAVENOUS | Status: DC | PRN
  Administered 2021-03-06: 100 mg via INTRAVENOUS

## 2021-03-06 MED ORDER — SODIUM CHLORIDE 0.9% FLUSH: 3.0000 mL | Freq: Two times a day (BID) | INTRAVENOUS | Status: DC

## 2021-03-06 MED ORDER — PROPOFOL 10 MG/ML IV BOLUS
INTRAVENOUS | Status: DC | PRN
  Administered 2021-03-06: 30 mg via INTRAVENOUS
  Administered 2021-03-06: 200 mg via INTRAVENOUS

## 2021-03-06 MED ORDER — AMISULPRIDE (ANTIEMETIC) 5 MG/2ML IV SOLN: 10.0000 mg | Freq: Once | INTRAVENOUS | Status: DC | PRN

## 2021-03-06 MED ORDER — OXYCODONE HCL 5 MG/5ML PO SOLN
5.0000 mg | Freq: Once | ORAL | Status: AC | PRN
Start: 2021-03-06 — End: 2021-03-06

## 2021-03-06 MED ORDER — HYDROMORPHONE HCL 1 MG/ML IJ SOLN
INTRAMUSCULAR | Status: AC
  Filled 2021-03-06: qty 0.5

## 2021-03-06 MED ORDER — OXYCODONE HCL 5 MG PO TABS
ORAL_TABLET | ORAL | Status: AC
  Filled 2021-03-06: qty 1

## 2021-03-06 MED ORDER — CEFAZOLIN SODIUM-DEXTROSE 2-4 GM/100ML-% IV SOLN
INTRAVENOUS | Status: AC
  Filled 2021-03-06: qty 100

## 2021-03-06 MED ORDER — FENTANYL CITRATE (PF) 100 MCG/2ML IJ SOLN
INTRAMUSCULAR | Status: AC
  Filled 2021-03-06: qty 2

## 2021-03-06 MED ORDER — LIDOCAINE HCL (CARDIAC) PF 100 MG/5ML IV SOSY
PREFILLED_SYRINGE | INTRAVENOUS | Status: DC | PRN
  Administered 2021-03-06: 60 mg via INTRAVENOUS

## 2021-03-06 MED ORDER — FENTANYL CITRATE (PF) 100 MCG/2ML IJ SOLN: 25.0000 ug | INTRAMUSCULAR | Status: DC | PRN

## 2021-03-06 MED ORDER — SUGAMMADEX SODIUM 200 MG/2ML IV SOLN
INTRAVENOUS | Status: DC | PRN
  Administered 2021-03-06: 200 mg via INTRAVENOUS

## 2021-03-06 MED ORDER — PROPOFOL 500 MG/50ML IV EMUL
INTRAVENOUS | Status: AC
  Filled 2021-03-06: qty 50

## 2021-03-06 MED ORDER — MIDAZOLAM HCL 5 MG/5ML IJ SOLN
INTRAMUSCULAR | Status: DC | PRN
  Administered 2021-03-06: 2 mg via INTRAVENOUS

## 2021-03-06 MED ORDER — SODIUM CHLORIDE 0.9% FLUSH: 3.0000 mL | INTRAVENOUS | Status: DC | PRN

## 2021-03-06 MED ORDER — CEFAZOLIN SODIUM-DEXTROSE 2-4 GM/100ML-% IV SOLN
2.0000 g | INTRAVENOUS | Status: AC
  Administered 2021-03-06: 2 g via INTRAVENOUS

## 2021-03-06 MED ORDER — DEXAMETHASONE SODIUM PHOSPHATE 4 MG/ML IJ SOLN
INTRAMUSCULAR | Status: DC | PRN
  Administered 2021-03-06: 5 mg via INTRAVENOUS

## 2021-03-06 MED ORDER — SODIUM CHLORIDE 0.9 % IV SOLN: 250.0000 mL | INTRAVENOUS | Status: DC | PRN

## 2021-03-06 MED ORDER — MIDAZOLAM HCL 2 MG/2ML IJ SOLN
INTRAMUSCULAR | Status: AC
  Filled 2021-03-06: qty 2

## 2021-03-06 MED ORDER — LIDOCAINE 2% (20 MG/ML) 5 ML SYRINGE
INTRAMUSCULAR | Status: AC
  Filled 2021-03-06: qty 5

## 2021-03-06 MED ORDER — HYDROMORPHONE HCL 1 MG/ML IJ SOLN
0.2500 mg | INTRAMUSCULAR | Status: DC | PRN
  Administered 2021-03-06 (×2): 0.5 mg via INTRAVENOUS

## 2021-03-06 MED ORDER — ACETAMINOPHEN 325 MG PO TABS: 650.0000 mg | ORAL_TABLET | ORAL | Status: DC | PRN

## 2021-03-06 MED ORDER — PROPOFOL 500 MG/50ML IV EMUL
INTRAVENOUS | Status: DC | PRN
  Administered 2021-03-06: 25 ug/kg/min via INTRAVENOUS

## 2021-03-06 MED ORDER — MEPERIDINE HCL 25 MG/ML IJ SOLN: 6.2500 mg | INTRAMUSCULAR | Status: DC | PRN

## 2021-03-06 MED ORDER — FENTANYL CITRATE (PF) 100 MCG/2ML IJ SOLN
INTRAMUSCULAR | Status: DC | PRN
  Administered 2021-03-06: 50 ug via INTRAVENOUS
  Administered 2021-03-06: 100 ug via INTRAVENOUS
  Administered 2021-03-06 (×5): 50 ug via INTRAVENOUS

## 2021-03-06 MED ORDER — ONDANSETRON HCL 4 MG/2ML IJ SOLN
INTRAMUSCULAR | Status: DC | PRN
  Administered 2021-03-06 (×2): 4 mg via INTRAVENOUS

## 2021-03-06 MED ORDER — LIDOCAINE HCL 1 % IJ SOLN
INTRAVENOUS | Status: DC | PRN
  Administered 2021-03-06: 1000 mL

## 2021-03-06 MED ORDER — LIDOCAINE-EPINEPHRINE 1 %-1:100000 IJ SOLN: INTRAMUSCULAR | Status: DC | PRN

## 2021-03-06 MED ORDER — LACTATED RINGERS IV SOLN: INTRAVENOUS | Status: DC

## 2021-03-06 MED ORDER — OXYCODONE HCL 5 MG PO TABS: 5.0000 mg | ORAL_TABLET | ORAL | Status: DC | PRN

## 2021-03-06 MED ORDER — ROCURONIUM BROMIDE 10 MG/ML (PF) SYRINGE
PREFILLED_SYRINGE | INTRAVENOUS | Status: AC
  Filled 2021-03-06: qty 10

## 2021-03-06 MED ORDER — OXYCODONE HCL 5 MG PO TABS
5.0000 mg | ORAL_TABLET | Freq: Once | ORAL | Status: AC | PRN
  Administered 2021-03-06: 5 mg via ORAL

## 2021-03-06 SURGICAL SUPPLY — 78 items
ADH SKN CLS APL DERMABOND .7 (GAUZE/BANDAGES/DRESSINGS) ×2
BAG DECANTER FOR FLEXI CONT (MISCELLANEOUS) ×3 IMPLANT
BINDER ABDOMINAL  9 SM 30-45 (SOFTGOODS)
BINDER ABDOMINAL 10 UNV 27-48 (MISCELLANEOUS) IMPLANT
BINDER ABDOMINAL 12 SM 30-45 (SOFTGOODS) ×3 IMPLANT
BINDER ABDOMINAL 9 SM 30-45 (SOFTGOODS) IMPLANT
BINDER BREAST LRG (GAUZE/BANDAGES/DRESSINGS) IMPLANT
BINDER BREAST MEDIUM (GAUZE/BANDAGES/DRESSINGS) IMPLANT
BINDER BREAST XLRG (GAUZE/BANDAGES/DRESSINGS) IMPLANT
BINDER BREAST XXLRG (GAUZE/BANDAGES/DRESSINGS) ×3 IMPLANT
BIOPATCH RED 1 DISK 7.0 (GAUZE/BANDAGES/DRESSINGS) IMPLANT
BLADE HEX COATED 2.75 (ELECTRODE) ×3 IMPLANT
BLADE SURG 15 STRL LF DISP TIS (BLADE) ×2 IMPLANT
BLADE SURG 15 STRL SS (BLADE) ×3
BNDG GAUZE ELAST 4 BULKY (GAUZE/BANDAGES/DRESSINGS) IMPLANT
CANISTER SUCT 1200ML W/VALVE (MISCELLANEOUS) ×3 IMPLANT
COVER BACK TABLE 60X90IN (DRAPES) ×3 IMPLANT
COVER MAYO STAND STRL (DRAPES) ×3 IMPLANT
COVER WAND RF STERILE (DRAPES) IMPLANT
DECANTER SPIKE VIAL GLASS SM (MISCELLANEOUS) ×3 IMPLANT
DERMABOND ADVANCED (GAUZE/BANDAGES/DRESSINGS) ×1
DERMABOND ADVANCED .7 DNX12 (GAUZE/BANDAGES/DRESSINGS) ×2 IMPLANT
DRAIN CHANNEL 19F RND (DRAIN) IMPLANT
DRAPE LAPAROSCOPIC ABDOMINAL (DRAPES) ×3 IMPLANT
DRSG PAD ABDOMINAL 8X10 ST (GAUZE/BANDAGES/DRESSINGS) ×12 IMPLANT
ELECT BLADE 4.0 EZ CLEAN MEGAD (MISCELLANEOUS) ×3
ELECT BLADE 6.5 EXT (BLADE) IMPLANT
ELECT REM PT RETURN 9FT ADLT (ELECTROSURGICAL) ×3
ELECTRODE BLDE 4.0 EZ CLN MEGD (MISCELLANEOUS) ×2 IMPLANT
ELECTRODE REM PT RTRN 9FT ADLT (ELECTROSURGICAL) ×2 IMPLANT
EVACUATOR SILICONE 100CC (DRAIN) IMPLANT
EXTRACTOR CANIST REVOLVE STRL (CANNISTER) ×3 IMPLANT
FUNNEL KELLER 2 DISP (MISCELLANEOUS) IMPLANT
GAUZE SPONGE 4X4 12PLY STRL LF (GAUZE/BANDAGES/DRESSINGS) IMPLANT
GLOVE SURG ENC MOIS LTX SZ6.5 (GLOVE) ×24 IMPLANT
GOWN STRL REUS W/ TWL LRG LVL3 (GOWN DISPOSABLE) ×8 IMPLANT
GOWN STRL REUS W/TWL LRG LVL3 (GOWN DISPOSABLE) ×12
IV LACTATED RINGERS 1000ML (IV SOLUTION) ×6 IMPLANT
IV NS 500ML (IV SOLUTION)
IV NS 500ML BAXH (IV SOLUTION) IMPLANT
KIT FILL SYSTEM UNIVERSAL (SET/KITS/TRAYS/PACK) IMPLANT
LINER CANISTER 1000CC FLEX (MISCELLANEOUS) ×12 IMPLANT
NDL SAFETY ECLIPSE 18X1.5 (NEEDLE) IMPLANT
NEEDLE HYPO 18GX1.5 SHARP (NEEDLE)
NEEDLE HYPO 25X1 1.5 SAFETY (NEEDLE) ×3 IMPLANT
NS IRRIG 1000ML POUR BTL (IV SOLUTION) ×3 IMPLANT
PACK BASIN DAY SURGERY FS (CUSTOM PROCEDURE TRAY) ×3 IMPLANT
PAD ALCOHOL SWAB (MISCELLANEOUS) ×3 IMPLANT
PAD FOAM SILICONE BACKED (GAUZE/BANDAGES/DRESSINGS) ×6 IMPLANT
PENCIL SMOKE EVACUATOR (MISCELLANEOUS) ×3 IMPLANT
PIN SAFETY STERILE (MISCELLANEOUS) IMPLANT
SLEEVE SCD COMPRESS KNEE MED (STOCKING) ×3 IMPLANT
SPONGE LAP 18X18 RF (DISPOSABLE) ×6 IMPLANT
STRIP SUTURE WOUND CLOSURE 1/2 (MISCELLANEOUS) IMPLANT
SUT MNCRL AB 4-0 PS2 18 (SUTURE) ×3 IMPLANT
SUT MON AB 3-0 SH 27 (SUTURE) ×3
SUT MON AB 3-0 SH27 (SUTURE) ×2 IMPLANT
SUT MON AB 5-0 PS2 18 (SUTURE) ×3 IMPLANT
SUT PDS 3-0 CT2 (SUTURE)
SUT PDS AB 2-0 CT2 27 (SUTURE) IMPLANT
SUT PDS II 3-0 CT2 27 ABS (SUTURE) IMPLANT
SUT SILK 3 0 PS 1 (SUTURE) IMPLANT
SUT VIC AB 3-0 SH 27 (SUTURE)
SUT VIC AB 3-0 SH 27X BRD (SUTURE) IMPLANT
SUT VICRYL 4-0 PS2 18IN ABS (SUTURE) IMPLANT
SYR 10ML LL (SYRINGE) ×12 IMPLANT
SYR 3ML 18GX1 1/2 (SYRINGE) IMPLANT
SYR 50ML LL SCALE MARK (SYRINGE) IMPLANT
SYR BULB IRRIG 60ML STRL (SYRINGE) ×3 IMPLANT
SYR CONTROL 10ML LL (SYRINGE) ×3 IMPLANT
SYR TOOMEY 50ML (SYRINGE) IMPLANT
TOWEL GREEN STERILE FF (TOWEL DISPOSABLE) ×6 IMPLANT
TRAY DSU PREP LF (CUSTOM PROCEDURE TRAY) ×3 IMPLANT
TUBE CONNECTING 20X1/4 (TUBING) ×3 IMPLANT
TUBING INFILTRATION IT-10001 (TUBING) ×3 IMPLANT
TUBING SET GRADUATE ASPIR 12FT (MISCELLANEOUS) ×3 IMPLANT
UNDERPAD 30X36 HEAVY ABSORB (UNDERPADS AND DIAPERS) ×6 IMPLANT
YANKAUER SUCT BULB TIP NO VENT (SUCTIONS) ×3 IMPLANT

## 2021-03-06 NOTE — Anesthesia Procedure Notes (Signed)
Procedure Name: Intubation Date/Time: 03/06/2021 11:52 AM Performed by: Glory Buff, CRNA Pre-anesthesia Checklist: Patient identified, Emergency Drugs available, Suction available and Patient being monitored Patient Re-evaluated:Patient Re-evaluated prior to induction Oxygen Delivery Method: Circle system utilized Preoxygenation: Pre-oxygenation with 100% oxygen Induction Type: IV induction Ventilation: Mask ventilation without difficulty Laryngoscope Size: Miller and 3 Grade View: Grade I Tube type: Oral Tube size: 7.0 mm Number of attempts: 1 Airway Equipment and Method: Stylet and Oral airway Placement Confirmation: ETT inserted through vocal cords under direct vision,  positive ETCO2 and breath sounds checked- equal and bilateral Secured at: 20 cm Tube secured with: Tape Dental Injury: Teeth and Oropharynx as per pre-operative assessment

## 2021-03-06 NOTE — Anesthesia Preprocedure Evaluation (Addendum)
Anesthesia Evaluation  Patient identified by MRN, date of birth, ID band Patient awake    Reviewed: Allergy & Precautions, NPO status , Patient's Chart, lab work & pertinent test results  History of Anesthesia Complications Negative for: history of anesthetic complications  Airway Mallampati: II  TM Distance: >3 FB Neck ROM: Full    Dental  (+) Teeth Intact, Dental Advisory Given   Pulmonary neg pulmonary ROS,    breath sounds clear to auscultation       Cardiovascular Exercise Tolerance: Good negative cardio ROS   Rhythm:Regular Rate:Normal     Neuro/Psych  Neuromuscular disease    GI/Hepatic negative GI ROS, Neg liver ROS,   Endo/Other  Morbid obesity   Renal/GU negative Renal ROS     Musculoskeletal negative musculoskeletal ROS (+)   Abdominal (+) + obese,   Peds  Hematology negative hematology ROS (+)   Anesthesia Other Findings   Reproductive/Obstetrics                             Anesthesia Physical  Anesthesia Plan  ASA: III  Anesthesia Plan: General   Post-op Pain Management:    Induction: Intravenous  PONV Risk Score and Plan: 3 and Dexamethasone, Ondansetron, Treatment may vary due to age or medical condition and Midazolam  Airway Management Planned: LMA and Oral ETT  Additional Equipment:   Intra-op Plan:   Post-operative Plan: Extubation in OR  Informed Consent: I have reviewed the patients History and Physical, chart, labs and discussed the procedure including the risks, benefits and alternatives for the proposed anesthesia with the patient or authorized representative who has indicated his/her understanding and acceptance.     Dental advisory given  Plan Discussed with: CRNA, Anesthesiologist and Surgeon  Anesthesia Plan Comments:         Anesthesia Quick Evaluation

## 2021-03-06 NOTE — Transfer of Care (Signed)
Immediate Anesthesia Transfer of Care Note  Patient: Cheryl [name]  Procedure(s) Performed: Lipo filling and liposuction for improved symmetry (Bilateral Breast) revision of left breast flap (Left Chest)  Patient Location: PACU  Anesthesia Type:General  Level of Consciousness: drowsy, patient cooperative and responds to stimulation  Airway & Oxygen Therapy: Patient Spontanous Breathing and Patient connected to face mask oxygen  Post-op Assessment: Report given to RN and Post -op Vital signs reviewed and stable  Post vital signs: Reviewed and stable  Last Vitals:  Vitals Value Taken Time  BP    Temp    Pulse 103 03/06/21 1343  Resp    SpO2 100 % 03/06/21 1343  Vitals shown include unvalidated device data.  Last Pain:  Vitals:   03/06/21 1103  TempSrc: Oral  PainSc: 0-No pain         Complications: No complications documented.

## 2021-03-06 NOTE — Discharge Instructions (Signed)

## 2021-03-06 NOTE — Anesthesia Postprocedure Evaluation (Signed)
Anesthesia Post Note  Patient: Cheryl [name]  Procedure(s) Performed: Lipo filling and liposuction for improved symmetry (Bilateral Breast) revision of left breast flap (Left Chest)     Patient location during evaluation: PACU Anesthesia Type: General Level of consciousness: awake and alert Pain management: pain level controlled Vital Signs Assessment: post-procedure vital signs reviewed and stable Respiratory status: spontaneous breathing, nonlabored ventilation, respiratory function stable and patient connected to nasal cannula oxygen Cardiovascular status: blood pressure returned to baseline and stable Postop Assessment: no apparent nausea or vomiting Anesthetic complications: no   No complications documented.  Last Vitals:  Vitals:   03/06/21 1425 03/06/21 1444  BP: 136/82 139/84  Pulse: 86 92  Resp: 14 16  Temp:  37.1 C  SpO2: 98% 98%    Last Pain:  Vitals:   03/06/21 1440  TempSrc:   PainSc: 4                  Cheryl [name] Cheryl [name]

## 2021-03-06 NOTE — Interval H&P Note (Signed)
History and Physical Interval Note:  03/06/2021 11:01 AM  Cheryl [name]  has presented today for surgery, with the diagnosis of Breast asymmetry following reconstructive surgery.  The various methods of treatment have been discussed with the patient and family. After consideration of risks, benefits and other options for treatment, the patient has consented to  Procedure(s): Lipo filling and liposuction for improved symmetry (Bilateral) revision of left breast flap (Left) as a surgical intervention.  The patient's history has been reviewed, patient examined, no change in status, stable for surgery.  I have reviewed the patient's chart and labs.  Questions were answered to the patient's satisfaction.     [name] Sura Canul

## 2021-03-06 NOTE — Op Note (Signed)
DATE OF OPERATION: 03/06/2021  LOCATION: [facility]  PREOPERATIVE DIAGNOSIS: Breast asymmetry after breast cancer reconstruction  POSTOPERATIVE DIAGNOSIS: Same  PROCEDURE: 1.  Bilateral breast Lipo filling for improved symmetry 2.  Revision of left breast with excision of 2 x 6 cm skin and soft tissue  SURGEON: Tomoko Sandra Sanger Eleasha Cataldo, DO  ASSISTANT: Roetta Sessions, PA  EBL: 1 cc  CONDITION: Stable  COMPLICATIONS: None  INDICATION: The patient, Cheryl [name], is a 45 y.o. female born on 03/21/1976, is here for treatment of bilateral breast asymmetry after treatment for breast cancer and breast reconstruction.  She has implants on both sides of her breast with a latissimus muscle flap on the left side.Marland Kitchen   PROCEDURE DETAILS:  The patient was seen prior to surgery and marked.  The IV antibiotics were given. The patient was taken to the operating room and given a general anesthetic. A standard time out was performed and all information was confirmed by those in the room. SCDs were placed.   The breast and abdomen was prepped and draped.  Local with epinephrine was injected at several areas to introduce the tumescent cannula of the abdomen and lateral breast area.  A #15 blade was used to make a small incision at the inframammary fold of the lateral breast on each side and at the lower abdominal area and central abdominal area where she had a pre-existing scar.  Tumescent was infused into the abdomen and lateral breast area for a total of 1 L.  Liposuction was then performed of the abdomen.  The tissue was collected and the revolve device and prepared according to the manufactures guidelines.  It was rinsed and then collected into 10 cc syringes.  The suction was then connected to the regular suction tubing.  Liposuction was continued in the lateral breast area for improved contour.  A #15 blade was used to introduce fat into the lateral aspect of the right breast and medial  aspect of the right breast for improved contour and symmetry.  The left breast had a looseness on the upper part of the latissimus flap.  An area of 2 x 6 cm of skin and soft tissue was excised.  I was able to bring the edges together very nicely.  I think that this will help to improve the symmetry and contour.  The area was closed with 3-0 Monocryl deep and 4-0 Monocryl running subcuticular suture.  The fat was then injected into the medial superior, medial inferior and lateral superior portion of the breast.  A total of 110 cc was injected into the left breast and 70 cc into the right breast.  Incisions were closed with 5-0 Monocryl.  Sterile dressings and binders were applied.  The patient was allowed to wake up and taken to recovery room in stable condition at the end of the case. The family was notified at the end of the case.   The advanced practice practitioner (APP) assisted throughout the case.  The APP was essential in retraction and counter traction when needed to make the case progress smoothly.  This retraction and assistance made it possible to see the tissue plans for the procedure.  The assistance was needed for blood control, tissue re-approximation and assisted with closure of the incision site. \

## 2021-03-07 LAB — SURGICAL PATHOLOGY

## 2021-03-10 ENCOUNTER — Encounter (HOSPITAL_BASED_OUTPATIENT_CLINIC_OR_DEPARTMENT_OTHER): Payer: Self-pay | Admitting: Plastic Surgery

## 2021-03-18 ENCOUNTER — Ambulatory Visit (INDEPENDENT_AMBULATORY_CARE_PROVIDER_SITE_OTHER): Payer: 59 | Admitting: Plastic Surgery

## 2021-03-18 ENCOUNTER — Encounter: Payer: Self-pay | Admitting: Plastic Surgery

## 2021-03-18 ENCOUNTER — Other Ambulatory Visit: Payer: Self-pay

## 2021-03-18 VITALS — BP 126/81 | HR 87

## 2021-03-18 DIAGNOSIS — N651 Disproportion of reconstructed breast: Secondary | ICD-10-CM

## 2021-03-18 NOTE — Progress Notes (Signed)
The patient is a 45 year old female here for follow-up on her breast reconstruction.  She looks really good and is pleased.  With the patient's permission we put new pictures in the chart.  All areas seem to be healing nicely.  [name] got a little bit of firmness in the liposuction areas.  I recommend and encouraged her to do massage.  [name] still interested in nipple reconstruction with tissue rearrangement.  We will probably do that in 3 to 6 months.  We will see her back in 2 weeks.

## 2021-03-20 ENCOUNTER — Telehealth: Payer: Self-pay | Admitting: Physician Assistant

## 2021-03-20 DIAGNOSIS — Z1211 Encounter for screening for malignant neoplasm of colon: Secondary | ICD-10-CM

## 2021-03-20 NOTE — Telephone Encounter (Signed)
Referral has been placed.   Please contact patient and schedule CPE. AS, CMA

## 2021-03-20 NOTE — Addendum Note (Signed)
Addended by: Mickel Crow on: 03/20/2021 04:25 PM   Modules accepted: Orders

## 2021-03-20 NOTE — Telephone Encounter (Signed)
Patient would like a referral for a colonoscopy in [place] within the [facility] system if possible. Please advise, thanks.

## 2021-03-24 NOTE — Telephone Encounter (Signed)
Left voicemail for patient

## 2021-04-04 ENCOUNTER — Encounter: Payer: 59 | Admitting: Surgical

## 2021-04-04 ENCOUNTER — Encounter: Payer: Self-pay | Admitting: Gastroenterology

## 2021-04-09 ENCOUNTER — Other Ambulatory Visit: Payer: Self-pay | Admitting: Oncology

## 2021-04-14 ENCOUNTER — Other Ambulatory Visit (HOSPITAL_COMMUNITY): Payer: 59

## 2021-04-25 ENCOUNTER — Encounter: Payer: 59 | Admitting: Plastic Surgery

## 2021-05-16 ENCOUNTER — Encounter: Payer: 59 | Admitting: Surgical

## 2021-06-10 ENCOUNTER — Other Ambulatory Visit: Payer: Self-pay

## 2021-06-10 ENCOUNTER — Telehealth: Payer: 59 | Admitting: Plastic Surgery

## 2021-06-13 ENCOUNTER — Ambulatory Visit (AMBULATORY_SURGERY_CENTER): Payer: 59

## 2021-06-13 VITALS — Ht 61.0 in | Wt 210.0 lb

## 2021-06-13 DIAGNOSIS — Z1211 Encounter for screening for malignant neoplasm of colon: Secondary | ICD-10-CM

## 2021-06-13 NOTE — Progress Notes (Signed)
No egg or soy allergy known to patient  No issues with past sedation with any surgeries or procedures Patient denies ever being told they had issues or difficulty with intubation  No FH of Malignant Hyperthermia No diet pills per patient No home 02 use per patient  No blood thinners per patient  Pt denies issues with constipation  No A fib or A flutter  EMMI video to pt or via [place] [facility] guidelines implemented in [place] today with Pt and RN    Virtual previsit  Due to the COVID-19 pandemic we are asking patients to follow certain guidelines.  Pt aware of COVID protocols and [facility] guidelines

## 2021-07-02 ENCOUNTER — Encounter: Payer: Self-pay | Admitting: Oncology

## 2021-07-04 ENCOUNTER — Other Ambulatory Visit: Payer: Self-pay

## 2021-07-04 ENCOUNTER — Encounter: Payer: Self-pay | Admitting: Gastroenterology

## 2021-07-04 ENCOUNTER — Ambulatory Visit (AMBULATORY_SURGERY_CENTER): Payer: 59 | Admitting: Gastroenterology

## 2021-07-04 VITALS — BP 148/85 | HR 77 | Temp 98.6°F | Resp 20 | Ht 61.0 in | Wt 210.0 lb

## 2021-07-04 DIAGNOSIS — Z1211 Encounter for screening for malignant neoplasm of colon: Secondary | ICD-10-CM | POA: Diagnosis present

## 2021-07-04 MED ORDER — SODIUM CHLORIDE 0.9 % IV SOLN: 500.0000 mL | Freq: Once | INTRAVENOUS | Status: DC

## 2021-07-04 NOTE — Op Note (Signed)
[name] Procedure Date: 07/04/2021 8:43 AM MRN: [id number] Endoscopist: Justice Britain , MD Age: 45 Referring MD:  Date of Birth: 1976/02/08 Gender: Female Account #: [id number] Procedure:                Colonoscopy Indications:              Screening for colorectal malignant neoplasm, This                            is the patient's first colonoscopy Medicines:                Monitored Anesthesia Care Procedure:                Pre-Anesthesia Assessment:                           - Prior to the procedure, a History and Physical                            was performed, and patient medications and                            allergies were reviewed. The patient's tolerance of                            previous anesthesia was also reviewed. The risks                            and benefits of the procedure and the sedation                            options and risks were discussed with the patient.                            All questions were answered, and informed consent                            was obtained. Prior Anticoagulants: The patient has                            taken no previous anticoagulant or antiplatelet                            agents. ASA Grade Assessment: II - A patient with                            mild systemic disease. After reviewing the risks                            and benefits, the patient was deemed in                            satisfactory condition to undergo the procedure.  After obtaining informed consent, the colonoscope                            was passed under direct vision. Throughout the                            procedure, the patient's blood pressure, pulse, and                            oxygen saturations were monitored continuously. The                            Olympus CF-HQ190L ([id number]) Colonoscope was                            introduced through the  anus and advanced to the 5                            cm into the ileum. The colonoscopy was performed                            without difficulty. The patient tolerated the                            procedure. The quality of the bowel preparation was                            good. The terminal ileum, ileocecal valve,                            appendiceal orifice, and rectum were photographed. Scope In: 8:53:53 AM Scope Out: 9:05:02 AM Scope Withdrawal Time: 0 hours 8 minutes 0 seconds  Total Procedure Duration: 0 hours 11 minutes 9 seconds  Findings:                 Skin tags were found on perianal exam.                           The digital rectal exam findings include                            hemorrhoids. Pertinent negatives include no                            palpable rectal lesions.                           The terminal ileum and ileocecal valve appeared                            normal.                           A few small-mouthed diverticula were found in the  recto-sigmoid colon, sigmoid colon and descending                            colon.                           Normal mucosa was found in the entire colon                            otherwise.                           Non-bleeding non-thrombosed external and internal                            hemorrhoids were found during retroflexion, during                            perianal exam and during digital exam. The                            hemorrhoids were Grade II (internal hemorrhoids                            that prolapse but reduce spontaneously). Complications:            No immediate complications. Estimated Blood Loss:     Estimated blood loss: none. Impression:               - Perianal skin tags found on perianal exam.                            Hemorrhoids found on digital rectal exam.                           - The examined portion of the ileum was normal.                            - Diverticulosis in the recto-sigmoid colon, in the                            sigmoid colon and in the descending colon.                           - Normal mucosa in the entire examined colon                            otherwise.                           - Non-bleeding non-thrombosed external and internal                            hemorrhoids. Recommendation:           - The patient will be observed post-procedure,  until all discharge criteria are met.                           - Discharge patient to home.                           - Patient has a contact number available for                            emergencies. The signs and symptoms of potential                            delayed complications were discussed with the                            patient. Return to normal activities tomorrow.                            Written discharge instructions were provided to the                            patient.                           - High fiber diet.                           - Use FiberCon 1-2 tablets PO daily.                           - Continue present medications.                           - Repeat colonoscopy in 10 years for screening                            purposes.                           - The findings and recommendations were discussed                            with the patient.                           - The findings and recommendations were discussed                            with the patient's family. Justice Britain, MD 07/04/2021 9:10:48 AM

## 2021-07-04 NOTE — Progress Notes (Signed)
Pt's states no medical or surgical changes since previsit or office visit. 

## 2021-07-04 NOTE — Progress Notes (Signed)
GASTROENTEROLOGY PROCEDURE H&P NOTE   Primary Care Physician: Lorrene Reid, PA-C  HPI: Cheryl [name] is a 45 y.o. female who presents for Colonoscopy for screening.  Past Medical History:  Diagnosis Date   Allergy    pollen   Breast cancer ([place]) 2016 and 2018   left breast, surgery and chemo done 2018   Cellulitis 07/25/2018   left breast   Pre-diabetes    SVD (spontaneous vaginal delivery)    x 3   Wears contact lenses    Past Surgical History:  Procedure Laterality Date   ABDOMINAL HYSTERECTOMY     BILATERAL TOTAL MASTECTOMY WITH AXILLARY LYMPH NODE DISSECTION  04/20/2017   BREAST BIOPSY Left 02/18/2015   BREAST BIOPSY Right 02/18/2015   BREAST EXCISIONAL BIOPSY Left 04/05/2015   BREAST IMPLANT REMOVAL Left 09/15/2018   Procedure: REMOVAL BREAST IMPLANTS;  Surgeon: Wallace Going, DO;  Location: [facility];  Service: Plastics;  Laterality: Left;   BREAST LUMPECTOMY Left 03/27/2015   BREAST LUMPECTOMY WITH NEEDLE LOCALIZATION AND AXILLARY SENTINEL LYMPH NODE BX Left 03/27/2015   Procedure: LEFT BREAST LUMPECTOMY WITH BRACKETED NEEDLE LOCALIZATION AND LEFT  AXILLARY SENTINEL LYMPH NODE BX;  Surgeon: [name], MD;  Location: [facility];  Service: General;  Laterality: Left;   BREAST RECONSTRUCTION Left 03/06/2021   Procedure: revision of left breast flap;  Surgeon: Wallace Going, DO;  Location: [facility];  Service: Plastics;  Laterality: Left;   BREAST RECONSTRUCTION WITH PLACEMENT OF TISSUE EXPANDER AND FLEX HD (ACELLULAR HYDRATED DERMIS) Bilateral 04/20/2017   Procedure: BILATARAL BREAST RECONSTRUCTION WITH PLACEMENT OF TISSUE EXPANDER AND FLEX HD (ACELLULAR HYDRATED DERMIS);  Surgeon: Wallace Going, DO;  Location: [facility];  Service: Plastics;  Laterality: Bilateral;   BREAST REDUCTION SURGERY Right 12/28/2018   Procedure: Liposuction of medial right breast for improved symmetry;  Surgeon: Wallace Going,  DO;  Location: [facility];  Service: Plastics;  Laterality: Right;   BREAST SURGERY Bilateral    masectomy   CHOLECYSTECTOMY N/A 07/07/2017   Procedure: LAPAROSCOPIC CHOLECYSTECTOMY WITH INTRAOPERATIVE CHOLANGIOGRAM;  Surgeon: [name], MD;  Location: [facility];  Service: General;  Laterality: N/A;   Insertion of Essure  yrs ago   For Birth control   [place] Bilateral 09/08/2018   Procedure: LAPAROSCOPIC ASSISTED VAGINAL HYSTERECTOMY WITH SALPINGO OOPHORECTOMY;  Surgeon: [name], MD;  Location: [facility];  Service: Gynecology;  Laterality: Bilateral;   LATISSIMUS FLAP TO BREAST Left 09/15/2018   Procedure: LATISSIMUS FLAP TO LEFT BREAST;  Surgeon: Wallace Going, DO;  Location: [facility];  Service: Plastics;  Laterality: Left;   LIPOSUCTION WITH LIPOFILLING Bilateral 05/20/2018   Procedure: Bilateral breast fat grafting from abdomen to improve symmetry following breast reconstruction;  Surgeon: Wallace Going, DO;  Location: [facility];  Service: Plastics;  Laterality: Bilateral;   LIPOSUCTION WITH LIPOFILLING Bilateral 03/06/2021   Procedure: Lipo filling and liposuction for improved symmetry;  Surgeon: Wallace Going, DO;  Location: [place];  Service: Plastics;  Laterality: Bilateral;   MASTECTOMY W/ SENTINEL NODE BIOPSY Bilateral 04/20/2017   Procedure: BILATERAL TOTAL MASTECTOMY WITH LEFT AXILLARY SENTINEL LYMPH NODE BIOPSY;  Surgeon: [name], MD;  Location: [place];  Service: General;  Laterality: Bilateral;   PORTA CATH REMOVAL Right 11/04/2017   Procedure: PORTA CATH REMOVAL;  Surgeon: Wallace Going, DO;  Location: [facility];  Service: Plastics;  Laterality:  Right;   PORTACATH PLACEMENT Right 04/25/2015   Procedure: INSERTION PORT-A-CATH;  Surgeon: [name], MD;  Location: [facility];  Service:  General;  Laterality: Right;, renoved in 2016   PORTACATH PLACEMENT Right 04/20/2017   Procedure: INSERTION PORT-A-CATH;  Surgeon: [name], MD;  Location: [place];  Service: General;  Laterality: Right;   RE-EXCISION OF BREAST LUMPECTOMY Left 04/05/2015   Procedure: RE-EXCISION LEFT  BREAST LUMPECTOMY;  Surgeon: [name], MD;  Location: [place];  Service: General;  Laterality: Left;   REMOVAL OF TISSUE EXPANDER AND PLACEMENT OF IMPLANT Bilateral 11/04/2017   Procedure: BILATERAL REMOVAL OF TISSUE EXPANDER AND PLACEMENT OF  BILATERAL BREAST IMPLANT;  Surgeon: Wallace Going, DO;  Location: [place];  Service: Plastics;  Laterality: Bilateral;   REMOVAL OF TISSUE EXPANDER AND PLACEMENT OF IMPLANT Left 12/28/2018   Procedure: Removal of left breast expander and placement of silicone implant.;  Surgeon: Wallace Going, DO;  Location: [place];  Service: Plastics;  Laterality: Left;   TISSUE EXPANDER PLACEMENT Left 09/15/2018   Procedure: PLACEMENT OF TISSUE EXPANDER;  Surgeon: Wallace Going, DO;  Location: [place];  Service: Plastics;  Laterality: Left;   Current Outpatient Medications  Medication Sig Dispense Refill   anastrozole (ARIMIDEX) 1 MG tablet TAKE 1 TABLET BY MOUTH EVERY DAY 90 tablet 4   b complex vitamins tablet Take 1 tablet by mouth daily.     loratadine (CLARITIN) 10 MG tablet Take 10 mg by mouth daily.     Vitamin D, Ergocalciferol, (DRISDOL) 1.25 MG (50000 UNIT) CAPS capsule TAKE 1 CAPSULE (50,000 UNITS TOTAL) BY MOUTH EVERY 7 (SEVEN) DAYS. 12 capsule 4   Current Facility-Administered Medications  Medication Dose Route Frequency Provider Last Rate Last Admin   0.9 %  sodium chloride infusion  500 mL Intravenous Once Mansouraty, Telford Nab., MD        Current Outpatient Medications:    anastrozole (ARIMIDEX) 1 MG tablet, TAKE 1 TABLET BY MOUTH EVERY DAY, Disp: 90 tablet, Rfl: 4   b complex  vitamins tablet, Take 1 tablet by mouth daily., Disp: , Rfl:    loratadine (CLARITIN) 10 MG tablet, Take 10 mg by mouth daily., Disp: , Rfl:    Vitamin D, Ergocalciferol, (DRISDOL) 1.25 MG (50000 UNIT) CAPS capsule, TAKE 1 CAPSULE (50,000 UNITS TOTAL) BY MOUTH EVERY 7 (SEVEN) DAYS., Disp: 12 capsule, Rfl: 4  Current Facility-Administered Medications:    0.9 %  sodium chloride infusion, 500 mL, Intravenous, Once, Mansouraty, Telford Nab., MD Allergies  Allergen Reactions   Adhesive [Tape] Other (See Comments)    Band Aids causes Skin blisters.  All other tape is okay to use   Family History  Problem Relation Age of Onset   Esophageal cancer Paternal Uncle    Lung cancer Maternal Grandmother 81       non smoker   Lung cancer Maternal Grandfather 49       non smoker worked in [place] Paternal Grandfather 35       throat cancer ? smoker   Colon cancer Neg Hx    Colon polyps Neg Hx    Rectal cancer Neg Hx    Stomach cancer Neg Hx    Social History   Socioeconomic History   Marital status: Divorced    Spouse name: Not on file   Number of children: 3   Years of education: Not on file   Highest education  level: Not on file  Occupational History   Not on file  Tobacco Use   Smoking status: Never    Passive exposure: Past (dad smoked as a child)   Smokeless tobacco: Never  Vaping Use   Vaping Use: Never used  Substance and Sexual Activity   Alcohol use: Yes    Alcohol/week: 0.0 standard drinks    Comment: socially twice month   Drug use: No   Sexual activity: Yes    Birth control/protection: Surgical    Comment: Essure   Other Topics Concern   Not on file  Social History Narrative   Not on file   Social Determinants of Health   Financial Resource Strain: Not on file  Food Insecurity: Not on file  Transportation Needs: Not on file  Physical Activity: Not on file  Stress: Not on file  Social Connections: Not on file  Intimate Partner Violence: Not on file     Physical Exam: Vital signs in last 24 hours: '@VSRANGES'$ @   GEN: NAD EYE: Sclerae anicteric ENT: MMM CV: Non-tachycardic GI: Soft, NT/ND NEURO:  Alert & Oriented x 3  Lab Results: No results for input(s): WBC, HGB, HCT, PLT in the last 72 hours. BMET No results for input(s): NA, K, CL, CO2, GLUCOSE, BUN, CREATININE, CALCIUM in the last 72 hours. LFT No results for input(s): PROT, ALBUMIN, AST, ALT, ALKPHOS, BILITOT, BILIDIR, IBILI in the last 72 hours. PT/INR No results for input(s): LABPROT, INR in the last 72 hours.   Impression / Plan: This is a 45 y.o.female who presents for Colonoscopy for screening.  The risks and benefits of endoscopic evaluation/treatment were discussed with the patient and/or family; these include but are not limited to the risk of perforation, infection, bleeding, missed lesions, lack of diagnosis, severe illness requiring hospitalization, as well as anesthesia and sedation related illnesses.  The patient's history has been reviewed, patient examined, no change in status, and deemed stable for procedure.  The patient and/or family is agreeable to proceed.    Justice Britain, MD [facility] Advanced [facility]oscopy Office # [phone]

## 2021-07-04 NOTE — Progress Notes (Signed)
PT taken to PACU. Monitors in place. VSS. Report given to RN. 

## 2021-07-04 NOTE — Patient Instructions (Signed)
Please read handouts provided. Continue present medications. High Fiber Diet. Use FiberCon 1-2 tablets daily. Repeat colonoscopy in 10 years for screening.   YOU HAD AN ENDOSCOPIC PROCEDURE TODAY AT [facility]:   Refer to the procedure report that was given to you for any specific questions about what was found during the examination.  If the procedure report does not answer your questions, please call your gastroenterologist to clarify.  If you requested that your care partner not be given the details of your procedure findings, then the procedure report has been included in a sealed envelope for you to review at your convenience later.  YOU SHOULD EXPECT: Some feelings of bloating in the abdomen. Passage of more gas than usual.  Walking can help get rid of the air that was put into your GI tract during the procedure and reduce the bloating. If you had a lower endoscopy (such as a colonoscopy or flexible sigmoidoscopy) you may notice spotting of blood in your stool or on the toilet paper. If you underwent a bowel prep for your procedure, you may not have a normal bowel movement for a few days.  Please Note:  You might notice some irritation and congestion in your nose or some drainage.  This is from the oxygen used during your procedure.  There is no need for concern and it should clear up in a day or so.  SYMPTOMS TO REPORT IMMEDIATELY:  Following lower endoscopy (colonoscopy or flexible sigmoidoscopy):  Excessive amounts of blood in the stool  Significant tenderness or worsening of abdominal pains  Swelling of the abdomen that is new, acute  Fever of 100F or higher   For urgent or emergent issues, a gastroenterologist can be reached at any hour by calling [phone]. Do not use MyChart messaging for urgent concerns.    DIET:  We do recommend a small meal at first, but then you may proceed to your regular diet.  Drink plenty of fluids but you should avoid alcoholic  beverages for 24 hours.  ACTIVITY:  You should plan to take it easy for the rest of today and you should NOT DRIVE or use heavy machinery until tomorrow (because of the sedation medicines used during the test).    FOLLOW UP: Our staff will call the number listed on your records 48-72 hours following your procedure to check on you and address any questions or concerns that you may have regarding the information given to you following your procedure. If we do not reach you, we will leave a message.  We will attempt to reach you two times.  During this call, we will ask if you have developed any symptoms of COVID 19. If you develop any symptoms (ie: fever, flu-like symptoms, shortness of breath, cough etc.) before then, please call [phone].  If you test positive for Covid 19 in the 2 weeks post procedure, please call and report this information to [place].    If any biopsies were taken you will be contacted by phone or by letter within the next 1-3 weeks.  Please call us at [phone] if you have not heard about the biopsies in 3 weeks.    SIGNATURES/CONFIDENTIALITY: You and/or your care partner have signed paperwork which will be entered into your electronic medical record.  These signatures attest to the fact that that the information above on your After Visit Summary has been reviewed and is understood.  Full responsibility of the confidentiality of this discharge information lies  with you and/or your care-partner.  

## 2021-07-08 ENCOUNTER — Telehealth: Payer: Self-pay

## 2021-07-08 NOTE — Telephone Encounter (Signed)
Left message on answering machine. 

## 2021-07-12 ENCOUNTER — Encounter: Payer: Self-pay | Admitting: Plastic Surgery

## 2021-07-15 ENCOUNTER — Encounter: Payer: 59 | Admitting: Physician Assistant

## 2021-07-16 ENCOUNTER — Telehealth: Payer: Self-pay | Admitting: Physician Assistant

## 2021-07-16 NOTE — Telephone Encounter (Signed)
Left msg for patient to call back. AS, CMA 

## 2021-07-16 NOTE — Telephone Encounter (Signed)
Patient has an ear infection, and has not taken a covid test. Does she need one to be scheduled?

## 2021-07-17 ENCOUNTER — Telehealth: Payer: Self-pay | Admitting: Physician Assistant

## 2021-07-17 NOTE — Telephone Encounter (Signed)
Patient left a voicemail returning your call, thanks. Please advise.

## 2021-07-17 NOTE — Telephone Encounter (Signed)
Patient states she went to [facility] for evaluation and treatment. AS< CMA

## 2021-07-17 NOTE — Telephone Encounter (Signed)
Left msg for patient to call back. AS, CMA 

## 2021-07-17 NOTE — Telephone Encounter (Signed)
Rosana Berger C routed conversation to You 53 minutes ago (1:15 PM)   Rosana Berger C 53 minutes ago (1:15 PM)   BA Patient left a voicemail returning your call, thanks. Please advise.       Note    Malanna, Sheely [phone]  Donald Prose

## 2021-07-21 ENCOUNTER — Ambulatory Visit: Payer: 59 | Admitting: Nurse Practitioner

## 2021-07-25 ENCOUNTER — Encounter: Payer: 59 | Admitting: Physician Assistant

## 2021-08-08 ENCOUNTER — Encounter: Payer: 59 | Admitting: Plastic Surgery

## 2021-09-02 ENCOUNTER — Encounter: Payer: Self-pay | Admitting: Physician Assistant

## 2021-09-02 ENCOUNTER — Other Ambulatory Visit: Payer: Self-pay

## 2021-09-02 ENCOUNTER — Ambulatory Visit (INDEPENDENT_AMBULATORY_CARE_PROVIDER_SITE_OTHER): Payer: 59 | Admitting: Physician Assistant

## 2021-09-02 VITALS — BP 148/98 | HR 98 | Ht 61.0 in | Wt 222.8 lb

## 2021-09-02 DIAGNOSIS — N651 Disproportion of reconstructed breast: Secondary | ICD-10-CM

## 2021-09-02 MED ORDER — ONDANSETRON 4 MG PO TBDP: 4.0000 mg | ORAL_TABLET | Freq: Three times a day (TID) | ORAL | 0 refills | Status: DC | PRN

## 2021-09-02 MED ORDER — HYDROCODONE-ACETAMINOPHEN 5-325 MG PO TABS: 1.0000 | ORAL_TABLET | Freq: Three times a day (TID) | ORAL | 0 refills | Status: AC | PRN

## 2021-09-02 MED ORDER — CEPHALEXIN 500 MG PO CAPS: 500.0000 mg | ORAL_CAPSULE | Freq: Four times a day (QID) | ORAL | 0 refills | Status: AC

## 2021-09-02 NOTE — Progress Notes (Signed)
Patient ID: Cheryl [name], female    DOB: Apr 06, 1976, 45 y.o.   MRN: [id number]  Chief Complaint  Patient presents with   Pre-op Exam      ICD-10-CM   1. Breast asymmetry following reconstructive surgery  N65.1        History of Present Illness: Cheryl [name] is a 45 y.o.  female  with a history of left-sided breast cancer with radiation and subsequent bilateral mastectomies s/p latissimus muscle flap reconstruction.  She presents for preoperative evaluation for upcoming procedure, nipple reconstruction, scheduled for 09/18/2021 with Dr. Marla Roe.  The patient has not had problems with anesthesia.  No personal or family history of thromboses or clotting/bleeding disorders.  She denies personal history of DM and does not take any oral antihyperglycemic's.  [name] on daily anastrozole, but has not had to discontinue in setting of surgery.  She states that she did require heparin administration for her latissimus dorsi breast reconstruction.  Patient is a non-smoker.  Allergy to adhesive tape, used paper tape with previous dressings.  No changes in interim health.  Summary of Previous Visit: Patient was seen most recently in clinic on 03/18/2021.  At that time, she was s/p Lipo filling and liposuction to help with contour of her breast reconstruction.  Patient was also expressed interest in nipple reconstruction with tissue arrangement.  PMH Significant for: Left-sided breast cancer.   Past Medical History: Allergies: Allergies  Allergen Reactions   Adhesive [Tape] Other (See Comments)    Band Aids causes Skin blisters.  All other tape is okay to use    Current Medications:  Current Outpatient Medications:    anastrozole (ARIMIDEX) 1 MG tablet, TAKE 1 TABLET BY MOUTH EVERY DAY, Disp: 90 tablet, Rfl: 4   b complex vitamins tablet, Take 1 tablet by mouth daily., Disp: , Rfl:    loratadine (CLARITIN) 10 MG tablet, Take 10 mg by mouth daily., Disp: , Rfl:     Vitamin D, Ergocalciferol, (DRISDOL) 1.25 MG (50000 UNIT) CAPS capsule, TAKE 1 CAPSULE (50,000 UNITS TOTAL) BY MOUTH EVERY 7 (SEVEN) DAYS., Disp: 12 capsule, Rfl: 4  Past Medical Problems: Past Medical History:  Diagnosis Date   Allergy    pollen   Breast cancer ([place]) 2016 and 2018   left breast, surgery and chemo done 2018   Cellulitis 07/25/2018   left breast   Pre-diabetes    SVD (spontaneous vaginal delivery)    x 3   Wears contact lenses     Past Surgical History: Past Surgical History:  Procedure Laterality Date   ABDOMINAL HYSTERECTOMY     BILATERAL TOTAL MASTECTOMY WITH AXILLARY LYMPH NODE DISSECTION  04/20/2017   BREAST BIOPSY Left 02/18/2015   BREAST BIOPSY Right 02/18/2015   BREAST EXCISIONAL BIOPSY Left 04/05/2015   BREAST IMPLANT REMOVAL Left 09/15/2018   Procedure: REMOVAL BREAST IMPLANTS;  Surgeon: Wallace Going, DO;  Location: [place];  Service: Plastics;  Laterality: Left;   BREAST LUMPECTOMY Left 03/27/2015   BREAST LUMPECTOMY WITH NEEDLE LOCALIZATION AND AXILLARY SENTINEL LYMPH NODE BX Left 03/27/2015   Procedure: LEFT BREAST LUMPECTOMY WITH BRACKETED NEEDLE LOCALIZATION AND LEFT  AXILLARY SENTINEL LYMPH NODE BX;  Surgeon: [name], MD;  Location: [place];  Service: General;  Laterality: Left;   BREAST RECONSTRUCTION Left 03/06/2021   Procedure: revision of left breast flap;  Surgeon: Wallace Going, DO;  Location: [facility];  Service: Plastics;  Laterality: Left;   BREAST  RECONSTRUCTION WITH PLACEMENT OF TISSUE EXPANDER AND FLEX HD (ACELLULAR HYDRATED DERMIS) Bilateral 04/20/2017   Procedure: BILATARAL BREAST RECONSTRUCTION WITH PLACEMENT OF TISSUE EXPANDER AND FLEX HD (ACELLULAR HYDRATED DERMIS);  Surgeon: Wallace Going, DO;  Location: [place];  Service: Plastics;  Laterality: Bilateral;   BREAST REDUCTION SURGERY Right 12/28/2018   Procedure: Liposuction of medial right breast for improved symmetry;   Surgeon: Wallace Going, DO;  Location: [place];  Service: Plastics;  Laterality: Right;   BREAST SURGERY Bilateral    masectomy   CHOLECYSTECTOMY N/A 07/07/2017   Procedure: LAPAROSCOPIC CHOLECYSTECTOMY WITH INTRAOPERATIVE CHOLANGIOGRAM;  Surgeon: [name], MD;  Location: [facility];  Service: General;  Laterality: N/A;   Insertion of Essure  yrs ago   For Birth control   [place] Bilateral 09/08/2018   Procedure: LAPAROSCOPIC ASSISTED VAGINAL HYSTERECTOMY WITH SALPINGO OOPHORECTOMY;  Surgeon: [name], MD;  Location: [place];  Service: Gynecology;  Laterality: Bilateral;   LATISSIMUS FLAP TO BREAST Left 09/15/2018   Procedure: LATISSIMUS FLAP TO LEFT BREAST;  Surgeon: Wallace Going, DO;  Location: [place];  Service: Plastics;  Laterality: Left;   LIPOSUCTION WITH LIPOFILLING Bilateral 05/20/2018   Procedure: Bilateral breast fat grafting from abdomen to improve symmetry following breast reconstruction;  Surgeon: Wallace Going, DO;  Location: [place];  Service: Plastics;  Laterality: Bilateral;   LIPOSUCTION WITH LIPOFILLING Bilateral 03/06/2021   Procedure: Lipo filling and liposuction for improved symmetry;  Surgeon: Wallace Going, DO;  Location: [place];  Service: Plastics;  Laterality: Bilateral;   MASTECTOMY W/ SENTINEL NODE BIOPSY Bilateral 04/20/2017   Procedure: BILATERAL TOTAL MASTECTOMY WITH LEFT AXILLARY SENTINEL LYMPH NODE BIOPSY;  Surgeon: [name], MD;  Location: [place];  Service: General;  Laterality: Bilateral;   PORTA CATH REMOVAL Right 11/04/2017   Procedure: PORTA CATH REMOVAL;  Surgeon: Wallace Going, DO;  Location: [place];  Service: Plastics;  Laterality: Right;   PORTACATH PLACEMENT Right 04/25/2015   Procedure: INSERTION PORT-A-CATH;  Surgeon: [name], MD;  Location: [place];  Service: General;  Laterality: Right;, renoved in 2016   PORTACATH PLACEMENT Right 04/20/2017   Procedure: INSERTION PORT-A-CATH;  Surgeon: [name], MD;  Location: [place];  Service: General;  Laterality: Right;   RE-EXCISION OF BREAST LUMPECTOMY Left 04/05/2015   Procedure: RE-EXCISION LEFT  BREAST LUMPECTOMY;  Surgeon: [name], MD;  Location: [place];  Service: General;  Laterality: Left;   REMOVAL OF TISSUE EXPANDER AND PLACEMENT OF IMPLANT Bilateral 11/04/2017   Procedure: BILATERAL REMOVAL OF TISSUE EXPANDER AND PLACEMENT OF  BILATERAL BREAST IMPLANT;  Surgeon: Wallace Going, DO;  Location: [place];  Service: Plastics;  Laterality: Bilateral;   REMOVAL OF TISSUE EXPANDER AND PLACEMENT OF IMPLANT Left 12/28/2018   Procedure: Removal of left breast expander and placement of silicone implant.;  Surgeon: Wallace Going, DO;  Location: [place];  Service: Plastics;  Laterality: Left;   TISSUE EXPANDER PLACEMENT Left 09/15/2018   Procedure: PLACEMENT OF TISSUE EXPANDER;  Surgeon: Wallace Going, DO;  Location: [place];  Service: Plastics;  Laterality: Left;    Social History: Social History   Socioeconomic History   Marital status: Divorced    Spouse name: Not on file   Number of children: 3   Years of education: Not on file   Highest education level: Not on  file  Occupational History   Not on file  Tobacco Use   Smoking status: Never    Passive exposure: Past (dad smoked as a child)   Smokeless tobacco: Never  Vaping Use   Vaping Use: Never used  Substance and Sexual Activity   Alcohol use: Yes    Alcohol/week: 0.0 standard drinks    Comment: socially twice month   Drug use: No   Sexual activity: Yes    Birth control/protection: Surgical    Comment: Essure   Other Topics Concern   Not on file  Social History Narrative   Not on file   Social Determinants of Health    Financial Resource Strain: Not on file  Food Insecurity: Not on file  Transportation Needs: Not on file  Physical Activity: Not on file  Stress: Not on file  Social Connections: Not on file  Intimate Partner Violence: Not on file    Family History: Family History  Problem Relation Age of Onset   Esophageal cancer Paternal Uncle    Lung cancer Maternal Grandmother 54       non smoker   Lung cancer Maternal Grandfather 55       non smoker worked in [place] Paternal Grandfather 35       throat cancer ? smoker   Colon cancer Neg Hx    Colon polyps Neg Hx    Rectal cancer Neg Hx    Stomach cancer Neg Hx     Review of Systems: Review of Systems  Constitutional:  Negative for fever.  Respiratory:  Negative for shortness of breath.   Cardiovascular:  Negative for chest pain.   Physical Exam: Vital Signs LMP 04/02/2015 (Approximate) Comment: after chemo - no periods  Physical Exam  Constitutional:      General: Not in acute distress.    Appearance: Normal appearance. Not ill-appearing.  HENT:     Head: Normocephalic and atraumatic.  Eyes:     Pupils: Pupils are equal, round Neck:     Musculoskeletal: Normal range of motion.  Cardiovascular:     Rate and Rhythm: Normal rate    Pulses: Normal pulses.  Pulmonary:     Effort: Pulmonary effort is normal. No respiratory distress.  Abdominal:     General: Abdomen is flat. There is no distension.  Musculoskeletal: Normal range of motion.  No lower extremity swelling or edema.  Peripheral pulses intact and symmetric throughout.  Spider veins, but no varicosities noted. Skin:    General: Skin is warm and dry.     Findings: No erythema or rash.  Neurological:     General: No focal deficit present.     Mental Status: Alert and oriented to person, place, and time. Mental status is at baseline.     Motor: No weakness.  Psychiatric:        Mood and Affect: Mood normal.        Behavior: Behavior normal.     Assessment/Plan: The patient is scheduled for nipple reconstruction 09/18/2021 with Dr. Marla Roe.  Risks, benefits, and alternatives of procedure discussed, questions answered and consent obtained.    Smoking Status: Non-smoker Last Mammogram: S/p bilateral mastectomies  Caprini Score: 7, high; Risk Factors include: Age, BMI greater than 25, history of malignancy, and length of planned surgery. Recommendation for mechanical and possibly pharmacologic prophylaxis. Encourage early ambulation.   Pictures obtained: 03/18/2021  Post-op Rx sent to pharmacy: Norco (#[id number]), Zofran, Keflex  Patient was provided with the General  Surgical Risk consent document and Pain Medication Agreement prior to their appointment.  They had adequate time to read through the risk consent documents and Pain Medication Agreement. We also discussed them in person together during this preop appointment. All of their questions were answered to their satisfaction.  Recommended calling if they have any further questions.  Risk consent form and Pain Medication Agreement to be scanned into patient's chart.   Electronically signed by: Krista Blue, PA-C 09/02/2021 8:29 AM

## 2021-09-02 NOTE — H&P (View-Only) (Signed)
Patient ID: Cheryl [name], female    DOB: 07-06-1976, 45 y.o.   MRN: [id number]  Chief Complaint  Patient presents with   Pre-op Exam      ICD-10-CM   1. Breast asymmetry following reconstructive surgery  N65.1        History of Present Illness: Cheryl [name] is a 45 y.o.  female  with a history of left-sided breast cancer with radiation and subsequent bilateral mastectomies s/p latissimus muscle flap reconstruction.  She presents for preoperative evaluation for upcoming procedure, nipple reconstruction, scheduled for 09/18/2021 with Dr. Marla Roe.  The patient has not had problems with anesthesia.  No personal or family history of thromboses or clotting/bleeding disorders.  She denies personal history of DM and does not take any oral antihyperglycemic's.  [name] on daily anastrozole, but has not had to discontinue in setting of surgery.  She states that she did require heparin administration for her latissimus dorsi breast reconstruction.  Patient is a non-smoker.  Allergy to adhesive tape, used paper tape with previous dressings.  No changes in interim health.  Summary of Previous Visit: Patient was seen most recently in clinic on 03/18/2021.  At that time, she was s/p Lipo filling and liposuction to help with contour of her breast reconstruction.  Patient was also expressed interest in nipple reconstruction with tissue arrangement.  PMH Significant for: Left-sided breast cancer.   Past Medical History: Allergies: Allergies  Allergen Reactions   Adhesive [Tape] Other (See Comments)    Band Aids causes Skin blisters.  All other tape is okay to use    Current Medications:  Current Outpatient Medications:    anastrozole (ARIMIDEX) 1 MG tablet, TAKE 1 TABLET BY MOUTH EVERY DAY, Disp: 90 tablet, Rfl: 4   b complex vitamins tablet, Take 1 tablet by mouth daily., Disp: , Rfl:    loratadine (CLARITIN) 10 MG tablet, Take 10 mg by mouth daily., Disp: , Rfl:     Vitamin D, Ergocalciferol, (DRISDOL) 1.25 MG (50000 UNIT) CAPS capsule, TAKE 1 CAPSULE (50,000 UNITS TOTAL) BY MOUTH EVERY 7 (SEVEN) DAYS., Disp: 12 capsule, Rfl: 4  Past Medical Problems: Past Medical History:  Diagnosis Date   Allergy    pollen   Breast cancer ([place]) 2016 and 2018   left breast, surgery and chemo done 2018   Cellulitis 07/25/2018   left breast   Pre-diabetes    SVD (spontaneous vaginal delivery)    x 3   Wears contact lenses     Past Surgical History: Past Surgical History:  Procedure Laterality Date   ABDOMINAL HYSTERECTOMY     BILATERAL TOTAL MASTECTOMY WITH AXILLARY LYMPH NODE DISSECTION  04/20/2017   BREAST BIOPSY Left 02/18/2015   BREAST BIOPSY Right 02/18/2015   BREAST EXCISIONAL BIOPSY Left 04/05/2015   BREAST IMPLANT REMOVAL Left 09/15/2018   Procedure: REMOVAL BREAST IMPLANTS;  Surgeon: Wallace Going, DO;  Location: [place];  Service: Plastics;  Laterality: Left;   BREAST LUMPECTOMY Left 03/27/2015   BREAST LUMPECTOMY WITH NEEDLE LOCALIZATION AND AXILLARY SENTINEL LYMPH NODE BX Left 03/27/2015   Procedure: LEFT BREAST LUMPECTOMY WITH BRACKETED NEEDLE LOCALIZATION AND LEFT  AXILLARY SENTINEL LYMPH NODE BX;  Surgeon: [name], MD;  Location: [place];  Service: General;  Laterality: Left;   BREAST RECONSTRUCTION Left 03/06/2021   Procedure: revision of left breast flap;  Surgeon: Wallace Going, DO;  Location: [place];  Service: Plastics;  Laterality: Left;   BREAST  RECONSTRUCTION WITH PLACEMENT OF TISSUE EXPANDER AND FLEX HD (ACELLULAR HYDRATED DERMIS) Bilateral 04/20/2017   Procedure: BILATARAL BREAST RECONSTRUCTION WITH PLACEMENT OF TISSUE EXPANDER AND FLEX HD (ACELLULAR HYDRATED DERMIS);  Surgeon: Wallace Going, DO;  Location: [place];  Service: Plastics;  Laterality: Bilateral;   BREAST REDUCTION SURGERY Right 12/28/2018   Procedure: Liposuction of medial right breast for improved symmetry;   Surgeon: Wallace Going, DO;  Location: [place];  Service: Plastics;  Laterality: Right;   BREAST SURGERY Bilateral    masectomy   CHOLECYSTECTOMY N/A 07/07/2017   Procedure: LAPAROSCOPIC CHOLECYSTECTOMY WITH INTRAOPERATIVE CHOLANGIOGRAM;  Surgeon: [name], MD;  Location: [facility];  Service: General;  Laterality: N/A;   Insertion of Essure  yrs ago   For Birth control   [place] Bilateral 09/08/2018   Procedure: LAPAROSCOPIC ASSISTED VAGINAL HYSTERECTOMY WITH SALPINGO OOPHORECTOMY;  Surgeon: [name], MD;  Location: [place];  Service: Gynecology;  Laterality: Bilateral;   LATISSIMUS FLAP TO BREAST Left 09/15/2018   Procedure: LATISSIMUS FLAP TO LEFT BREAST;  Surgeon: Wallace Going, DO;  Location: [place];  Service: Plastics;  Laterality: Left;   LIPOSUCTION WITH LIPOFILLING Bilateral 05/20/2018   Procedure: Bilateral breast fat grafting from abdomen to improve symmetry following breast reconstruction;  Surgeon: Wallace Going, DO;  Location: [place];  Service: Plastics;  Laterality: Bilateral;   LIPOSUCTION WITH LIPOFILLING Bilateral 03/06/2021   Procedure: Lipo filling and liposuction for improved symmetry;  Surgeon: Wallace Going, DO;  Location: [place];  Service: Plastics;  Laterality: Bilateral;   MASTECTOMY W/ SENTINEL NODE BIOPSY Bilateral 04/20/2017   Procedure: BILATERAL TOTAL MASTECTOMY WITH LEFT AXILLARY SENTINEL LYMPH NODE BIOPSY;  Surgeon: [name], MD;  Location: [place];  Service: General;  Laterality: Bilateral;   PORTA CATH REMOVAL Right 11/04/2017   Procedure: PORTA CATH REMOVAL;  Surgeon: Wallace Going, DO;  Location: [place];  Service: Plastics;  Laterality: Right;   PORTACATH PLACEMENT Right 04/25/2015   Procedure: INSERTION PORT-A-CATH;  Surgeon: [name], MD;  Location: [place];  Service: General;  Laterality: Right;, renoved in 2016   PORTACATH PLACEMENT Right 04/20/2017   Procedure: INSERTION PORT-A-CATH;  Surgeon: [name], MD;  Location: [place];  Service: General;  Laterality: Right;   RE-EXCISION OF BREAST LUMPECTOMY Left 04/05/2015   Procedure: RE-EXCISION LEFT  BREAST LUMPECTOMY;  Surgeon: [name], MD;  Location: [place];  Service: General;  Laterality: Left;   REMOVAL OF TISSUE EXPANDER AND PLACEMENT OF IMPLANT Bilateral 11/04/2017   Procedure: BILATERAL REMOVAL OF TISSUE EXPANDER AND PLACEMENT OF  BILATERAL BREAST IMPLANT;  Surgeon: Wallace Going, DO;  Location: [place];  Service: Plastics;  Laterality: Bilateral;   REMOVAL OF TISSUE EXPANDER AND PLACEMENT OF IMPLANT Left 12/28/2018   Procedure: Removal of left breast expander and placement of silicone implant.;  Surgeon: Wallace Going, DO;  Location: [place];  Service: Plastics;  Laterality: Left;   TISSUE EXPANDER PLACEMENT Left 09/15/2018   Procedure: PLACEMENT OF TISSUE EXPANDER;  Surgeon: Wallace Going, DO;  Location: [place];  Service: Plastics;  Laterality: Left;    Social History: Social History   Socioeconomic History   Marital status: Divorced    Spouse name: Not on file   Number of children: 3   Years of education: Not on file   Highest education level: Not on  file  Occupational History   Not on file  Tobacco Use   Smoking status: Never    Passive exposure: Past (dad smoked as a child)   Smokeless tobacco: Never  Vaping Use   Vaping Use: Never used  Substance and Sexual Activity   Alcohol use: Yes    Alcohol/week: 0.0 standard drinks    Comment: socially twice month   Drug use: No   Sexual activity: Yes    Birth control/protection: Surgical    Comment: Essure   Other Topics Concern   Not on file  Social History Narrative   Not on file   Social Determinants of Health    Financial Resource Strain: Not on file  Food Insecurity: Not on file  Transportation Needs: Not on file  Physical Activity: Not on file  Stress: Not on file  Social Connections: Not on file  Intimate Partner Violence: Not on file    Family History: Family History  Problem Relation Age of Onset   Esophageal cancer Paternal Uncle    Lung cancer Maternal Grandmother 33       non smoker   Lung cancer Maternal Grandfather 49       non smoker worked in [place] Paternal Grandfather 35       throat cancer ? smoker   Colon cancer Neg Hx    Colon polyps Neg Hx    Rectal cancer Neg Hx    Stomach cancer Neg Hx     Review of Systems: Review of Systems  Constitutional:  Negative for fever.  Respiratory:  Negative for shortness of breath.   Cardiovascular:  Negative for chest pain.   Physical Exam: Vital Signs LMP 04/02/2015 (Approximate) Comment: after chemo - no periods  Physical Exam  Constitutional:      General: Not in acute distress.    Appearance: Normal appearance. Not ill-appearing.  HENT:     Head: Normocephalic and atraumatic.  Eyes:     Pupils: Pupils are equal, round Neck:     Musculoskeletal: Normal range of motion.  Cardiovascular:     Rate and Rhythm: Normal rate    Pulses: Normal pulses.  Pulmonary:     Effort: Pulmonary effort is normal. No respiratory distress.  Abdominal:     General: Abdomen is flat. There is no distension.  Musculoskeletal: Normal range of motion.  No lower extremity swelling or edema.  Peripheral pulses intact and symmetric throughout.  Spider veins, but no varicosities noted. Skin:    General: Skin is warm and dry.     Findings: No erythema or rash.  Neurological:     General: No focal deficit present.     Mental Status: Alert and oriented to person, place, and time. Mental status is at baseline.     Motor: No weakness.  Psychiatric:        Mood and Affect: Mood normal.        Behavior: Behavior normal.     Assessment/Plan: The patient is scheduled for nipple reconstruction 09/18/2021 with Dr. Marla Roe.  Risks, benefits, and alternatives of procedure discussed, questions answered and consent obtained.    Smoking Status: Non-smoker Last Mammogram: S/p bilateral mastectomies  Caprini Score: 7, high; Risk Factors include: Age, BMI greater than 25, history of malignancy, and length of planned surgery. Recommendation for mechanical and possibly pharmacologic prophylaxis. Encourage early ambulation.   Pictures obtained: 03/18/2021  Post-op Rx sent to pharmacy: Norco (#12), Zofran, Keflex  Patient was provided with the General  Surgical Risk consent document and Pain Medication Agreement prior to their appointment.  They had adequate time to read through the risk consent documents and Pain Medication Agreement. We also discussed them in person together during this preop appointment. All of their questions were answered to their satisfaction.  Recommended calling if they have any further questions.  Risk consent form and Pain Medication Agreement to be scanned into patient's chart.   Electronically signed by: Krista Blue, PA-C 09/02/2021 8:29 AM

## 2021-09-11 ENCOUNTER — Encounter (HOSPITAL_BASED_OUTPATIENT_CLINIC_OR_DEPARTMENT_OTHER): Payer: Self-pay | Admitting: Plastic Surgery

## 2021-09-11 ENCOUNTER — Other Ambulatory Visit: Payer: Self-pay

## 2021-09-18 ENCOUNTER — Other Ambulatory Visit: Payer: Self-pay

## 2021-09-18 ENCOUNTER — Ambulatory Visit (HOSPITAL_BASED_OUTPATIENT_CLINIC_OR_DEPARTMENT_OTHER): Payer: 59 | Admitting: Anesthesiology

## 2021-09-18 ENCOUNTER — Encounter (HOSPITAL_BASED_OUTPATIENT_CLINIC_OR_DEPARTMENT_OTHER): Payer: Self-pay | Admitting: Plastic Surgery

## 2021-09-18 ENCOUNTER — Ambulatory Visit (HOSPITAL_BASED_OUTPATIENT_CLINIC_OR_DEPARTMENT_OTHER)
Admission: RE | Admit: 2021-09-18 | Discharge: 2021-09-18 | Disposition: A | Discharge status: Home / Self Care | Payer: 59 | Source: Ambulatory Visit | Attending: Plastic Surgery | Admitting: Plastic Surgery

## 2021-09-18 ENCOUNTER — Encounter (HOSPITAL_BASED_OUTPATIENT_CLINIC_OR_DEPARTMENT_OTHER)
Admission: RE | Disposition: A | Discharge status: Home / Self Care | Payer: Self-pay | Source: Ambulatory Visit | Attending: Plastic Surgery

## 2021-09-18 DIAGNOSIS — Z9013 Acquired absence of bilateral breasts and nipples: Secondary | ICD-10-CM | POA: Insufficient documentation

## 2021-09-18 DIAGNOSIS — Z853 Personal history of malignant neoplasm of breast: Secondary | ICD-10-CM | POA: Insufficient documentation

## 2021-09-18 DIAGNOSIS — N651 Disproportion of reconstructed breast: Secondary | ICD-10-CM

## 2021-09-18 DIAGNOSIS — Z79899 Other long term (current) drug therapy: Secondary | ICD-10-CM | POA: Insufficient documentation

## 2021-09-18 HISTORY — PX: BREAST RECONSTRUCTION: SHX9

## 2021-09-18 SURGERY — RECONSTRUCTION, NIPPLE
Anesthesia: General | Site: Breast | Laterality: Bilateral

## 2021-09-18 MED ORDER — MEPERIDINE HCL 25 MG/ML IJ SOLN: 6.2500 mg | INTRAMUSCULAR | Status: DC | PRN

## 2021-09-18 MED ORDER — EPHEDRINE 5 MG/ML INJ
INTRAVENOUS | Status: AC
  Filled 2021-09-18: qty 5

## 2021-09-18 MED ORDER — PHENYLEPHRINE HCL (PRESSORS) 10 MG/ML IV SOLN
INTRAVENOUS | Status: DC | PRN
  Administered 2021-09-18: 40 ug via INTRAVENOUS
  Administered 2021-09-18: 80 ug via INTRAVENOUS
  Administered 2021-09-18 (×2): 120 ug via INTRAVENOUS
  Administered 2021-09-18: 80 ug via INTRAVENOUS

## 2021-09-18 MED ORDER — NITROGLYCERIN 2 % TD OINT
TOPICAL_OINTMENT | TRANSDERMAL | Status: AC
  Filled 2021-09-18: qty 30

## 2021-09-18 MED ORDER — PROMETHAZINE HCL 25 MG/ML IJ SOLN: 6.2500 mg | INTRAMUSCULAR | Status: DC | PRN

## 2021-09-18 MED ORDER — ACETAMINOPHEN 325 MG PO TABS: 650.0000 mg | ORAL_TABLET | ORAL | Status: DC | PRN

## 2021-09-18 MED ORDER — ONDANSETRON HCL 4 MG/2ML IJ SOLN
INTRAMUSCULAR | Status: AC
  Filled 2021-09-18: qty 2

## 2021-09-18 MED ORDER — ONDANSETRON HCL 4 MG/2ML IJ SOLN
INTRAMUSCULAR | Status: DC | PRN
  Administered 2021-09-18: 4 mg via INTRAVENOUS

## 2021-09-18 MED ORDER — PHENYLEPHRINE 40 MCG/ML (10ML) SYRINGE FOR IV PUSH (FOR BLOOD PRESSURE SUPPORT)
PREFILLED_SYRINGE | INTRAVENOUS | Status: AC
  Filled 2021-09-18: qty 10

## 2021-09-18 MED ORDER — LIDOCAINE-EPINEPHRINE 1 %-1:100000 IJ SOLN
INTRAMUSCULAR | Status: DC | PRN
  Administered 2021-09-18: 7 mL

## 2021-09-18 MED ORDER — PROPOFOL 10 MG/ML IV BOLUS
INTRAVENOUS | Status: DC | PRN
  Administered 2021-09-18: 200 mg via INTRAVENOUS

## 2021-09-18 MED ORDER — CEFAZOLIN SODIUM-DEXTROSE 2-4 GM/100ML-% IV SOLN
2.0000 g | INTRAVENOUS | Status: AC
Start: 2021-09-18 — End: 2021-09-18
  Administered 2021-09-18: 2 g via INTRAVENOUS

## 2021-09-18 MED ORDER — ACETAMINOPHEN 325 MG RE SUPP: 650.0000 mg | RECTAL | Status: DC | PRN

## 2021-09-18 MED ORDER — OXYCODONE HCL 5 MG PO TABS: 5.0000 mg | ORAL_TABLET | ORAL | Status: DC | PRN

## 2021-09-18 MED ORDER — AMISULPRIDE (ANTIEMETIC) 5 MG/2ML IV SOLN: 10.0000 mg | Freq: Once | INTRAVENOUS | Status: DC | PRN

## 2021-09-18 MED ORDER — FENTANYL CITRATE (PF) 100 MCG/2ML IJ SOLN
INTRAMUSCULAR | Status: DC | PRN
  Administered 2021-09-18: 50 ug via INTRAVENOUS
  Administered 2021-09-18: 100 ug via INTRAVENOUS
  Administered 2021-09-18: 50 ug via INTRAVENOUS

## 2021-09-18 MED ORDER — CHLORHEXIDINE GLUCONATE CLOTH 2 % EX PADS: 6.0000 | MEDICATED_PAD | Freq: Once | CUTANEOUS | Status: DC

## 2021-09-18 MED ORDER — FENTANYL CITRATE (PF) 100 MCG/2ML IJ SOLN
INTRAMUSCULAR | Status: AC
  Filled 2021-09-18: qty 2

## 2021-09-18 MED ORDER — SODIUM CHLORIDE 0.9 % IV SOLN: 250.0000 mL | INTRAVENOUS | Status: DC | PRN

## 2021-09-18 MED ORDER — SODIUM CHLORIDE 0.9% FLUSH: 3.0000 mL | Freq: Two times a day (BID) | INTRAVENOUS | Status: DC

## 2021-09-18 MED ORDER — OXYCODONE HCL 5 MG/5ML PO SOLN: 5.0000 mg | Freq: Once | ORAL | Status: DC | PRN

## 2021-09-18 MED ORDER — SUCCINYLCHOLINE CHLORIDE 200 MG/10ML IV SOSY
PREFILLED_SYRINGE | INTRAVENOUS | Status: AC
  Filled 2021-09-18: qty 10

## 2021-09-18 MED ORDER — DEXAMETHASONE SODIUM PHOSPHATE 4 MG/ML IJ SOLN
INTRAMUSCULAR | Status: DC | PRN
  Administered 2021-09-18: 10 mg via INTRAVENOUS

## 2021-09-18 MED ORDER — SODIUM CHLORIDE 0.9% FLUSH: 3.0000 mL | INTRAVENOUS | Status: DC | PRN

## 2021-09-18 MED ORDER — LIDOCAINE 2% (20 MG/ML) 5 ML SYRINGE
INTRAMUSCULAR | Status: AC
  Filled 2021-09-18: qty 5

## 2021-09-18 MED ORDER — HYDROMORPHONE HCL 1 MG/ML IJ SOLN: 0.2500 mg | INTRAMUSCULAR | Status: DC | PRN

## 2021-09-18 MED ORDER — MIDAZOLAM HCL 5 MG/5ML IJ SOLN
INTRAMUSCULAR | Status: DC | PRN
  Administered 2021-09-18: 2 mg via INTRAVENOUS

## 2021-09-18 MED ORDER — ATROPINE SULFATE 0.4 MG/ML IV SOLN
INTRAVENOUS | Status: AC
  Filled 2021-09-18: qty 1

## 2021-09-18 MED ORDER — PROPOFOL 500 MG/50ML IV EMUL
INTRAVENOUS | Status: AC
  Filled 2021-09-18: qty 50

## 2021-09-18 MED ORDER — MORPHINE SULFATE (PF) 4 MG/ML IV SOLN
2.0000 mg | INTRAVENOUS | Status: DC | PRN
Start: 2021-09-18 — End: 2021-09-18

## 2021-09-18 MED ORDER — LIDOCAINE HCL (CARDIAC) PF 100 MG/5ML IV SOSY
PREFILLED_SYRINGE | INTRAVENOUS | Status: DC | PRN
  Administered 2021-09-18: 60 mg via INTRAVENOUS

## 2021-09-18 MED ORDER — DEXAMETHASONE SODIUM PHOSPHATE 10 MG/ML IJ SOLN
INTRAMUSCULAR | Status: AC
  Filled 2021-09-18: qty 1

## 2021-09-18 MED ORDER — MIDAZOLAM HCL 2 MG/2ML IJ SOLN
INTRAMUSCULAR | Status: AC
  Filled 2021-09-18: qty 2

## 2021-09-18 MED ORDER — LACTATED RINGERS IV SOLN: INTRAVENOUS | Status: DC

## 2021-09-18 MED ORDER — OXYCODONE HCL 5 MG PO TABS: 5.0000 mg | ORAL_TABLET | Freq: Once | ORAL | Status: DC | PRN

## 2021-09-18 MED ORDER — CEFAZOLIN SODIUM-DEXTROSE 2-4 GM/100ML-% IV SOLN
INTRAVENOUS | Status: AC
  Filled 2021-09-18: qty 100

## 2021-09-18 SURGICAL SUPPLY — 52 items
ADH SKN CLS APL DERMABOND .7 (GAUZE/BANDAGES/DRESSINGS) ×2
BAG DECANTER FOR FLEXI CONT (MISCELLANEOUS) ×2 IMPLANT
BINDER BREAST XLRG (GAUZE/BANDAGES/DRESSINGS) ×2 IMPLANT
BLADE SURG 15 STRL LF DISP TIS (BLADE) ×1 IMPLANT
BLADE SURG 15 STRL SS (BLADE) ×2
CANISTER SUCT 1200ML W/VALVE (MISCELLANEOUS) ×2 IMPLANT
CAUTERY EYE LOW TEMP 1300F FIN (OPHTHALMIC RELATED) ×2 IMPLANT
COVER BACK TABLE 60X90IN (DRAPES) ×2 IMPLANT
COVER MAYO STAND STRL (DRAPES) ×2 IMPLANT
DERMABOND ADVANCED (GAUZE/BANDAGES/DRESSINGS) ×2
DERMABOND ADVANCED .7 DNX12 (GAUZE/BANDAGES/DRESSINGS) ×2 IMPLANT
DRAPE LAPAROSCOPIC ABDOMINAL (DRAPES) ×2 IMPLANT
DRSG OPSITE POSTOP 4X6 (GAUZE/BANDAGES/DRESSINGS) ×4 IMPLANT
DRSG PAD ABDOMINAL 8X10 ST (GAUZE/BANDAGES/DRESSINGS) ×4 IMPLANT
DRSG TEGADERM 4X10 (GAUZE/BANDAGES/DRESSINGS) ×4 IMPLANT
ELECT BLADE 4.0 EZ CLEAN MEGAD (MISCELLANEOUS) ×2
ELECT REM PT RETURN 9FT ADLT (ELECTROSURGICAL) ×2
ELECTRODE BLDE 4.0 EZ CLN MEGD (MISCELLANEOUS) ×1 IMPLANT
ELECTRODE REM PT RTRN 9FT ADLT (ELECTROSURGICAL) ×1 IMPLANT
GLOVE SURG ENC MOIS LTX SZ6.5 (GLOVE) ×6 IMPLANT
GLOVE SURG ENC MOIS LTX SZ7.5 (GLOVE) ×8 IMPLANT
GLOVE SURG POLYISO LF SZ6.5 (GLOVE) ×2 IMPLANT
GLOVE SURG UNDER POLY LF SZ6.5 (GLOVE) ×4 IMPLANT
GLOVE SURG UNDER POLY LF SZ7.5 (GLOVE) ×4 IMPLANT
GOWN STRL REUS W/ TWL LRG LVL3 (GOWN DISPOSABLE) ×2 IMPLANT
GOWN STRL REUS W/ TWL XL LVL3 (GOWN DISPOSABLE) ×3 IMPLANT
GOWN STRL REUS W/TWL LRG LVL3 (GOWN DISPOSABLE) ×4
GOWN STRL REUS W/TWL XL LVL3 (GOWN DISPOSABLE) ×6
NEEDLE HYPO 25X1 1.5 SAFETY (NEEDLE) ×2 IMPLANT
NS IRRIG 1000ML POUR BTL (IV SOLUTION) ×2 IMPLANT
PACK BASIN DAY SURGERY FS (CUSTOM PROCEDURE TRAY) ×2 IMPLANT
PENCIL SMOKE EVACUATOR (MISCELLANEOUS) ×2 IMPLANT
SLEEVE SCD COMPRESS KNEE MED (STOCKING) ×2 IMPLANT
SPONGE T-LAP 18X18 ~~LOC~~+RFID (SPONGE) ×4 IMPLANT
STRIP SUTURE WOUND CLOSURE 1/2 (MISCELLANEOUS) ×4 IMPLANT
SUT CHROMIC 4 0 PS 2 18 (SUTURE) ×6 IMPLANT
SUT CHROMIC 5 0 P 3 (SUTURE) ×4 IMPLANT
SUT MNCRL AB 4-0 PS2 18 (SUTURE) ×4 IMPLANT
SUT MON AB 3-0 SH 27 (SUTURE) ×4
SUT MON AB 3-0 SH27 (SUTURE) ×2 IMPLANT
SUT MON AB 5-0 PS2 18 (SUTURE) ×4 IMPLANT
SUT PDS 3-0 CT2 (SUTURE) ×4
SUT PDS II 3-0 CT2 27 ABS (SUTURE) ×2 IMPLANT
SYR 20CC LL (SYRINGE) ×2 IMPLANT
SYR 50ML LL SCALE MARK (SYRINGE) ×4 IMPLANT
SYR BULB IRRIG 60ML STRL (SYRINGE) ×2 IMPLANT
SYR CONTROL 10ML LL (SYRINGE) ×2 IMPLANT
TOWEL GREEN STERILE FF (TOWEL DISPOSABLE) ×4 IMPLANT
TRAY DSU PREP LF (CUSTOM PROCEDURE TRAY) ×2 IMPLANT
TUBE CONNECTING 20X1/4 (TUBING) ×2 IMPLANT
UNDERPAD 30X36 HEAVY ABSORB (UNDERPADS AND DIAPERS) ×4 IMPLANT
YANKAUER SUCT BULB TIP NO VENT (SUCTIONS) ×2 IMPLANT

## 2021-09-18 NOTE — Anesthesia Postprocedure Evaluation (Signed)
Anesthesia Post Note  Patient: Cheryl [name]  Procedure(s) Performed: Bilateral Nipple reconstruction with tissue rearrangement (Bilateral: Breast)     Patient location during evaluation: PACU Anesthesia Type: General Level of consciousness: awake and alert Pain management: pain level controlled Vital Signs Assessment: post-procedure vital signs reviewed and stable Respiratory status: spontaneous breathing, nonlabored ventilation and respiratory function stable Cardiovascular status: blood pressure returned to baseline and stable Postop Assessment: no apparent nausea or vomiting Anesthetic complications: no   No notable events documented.  Last Vitals:  Vitals:   09/18/21 1252 09/18/21 1310  BP: (!) 124/99 128/70  Pulse:  95  Resp: 16 16  Temp:  37.1 C  SpO2: 96% 96%    Last Pain:  Vitals:   09/18/21 1310  TempSrc:   PainSc: 0-No pain                 Lynda Rainwater

## 2021-09-18 NOTE — Transfer of Care (Signed)
Immediate Anesthesia Transfer of Care Note  Patient: Cheryl [name]  Procedure(s) Performed: Bilateral Nipple reconstruction with tissue rearrangement (Bilateral: Breast)  Patient Location: PACU  Anesthesia Type:General  Level of Consciousness: drowsy  Airway & Oxygen Therapy: Patient Spontanous Breathing and Patient connected to face mask oxygen  Post-op Assessment: Report given to RN and Post -op Vital signs reviewed and stable  Post vital signs: Reviewed and stable  Last Vitals:  Vitals Value Taken Time  BP 121/71 09/18/21 1200  Temp    Pulse 95 09/18/21 1202  Resp 17 09/18/21 1202  SpO2 100 % 09/18/21 1202  Vitals shown include unvalidated device data.  Last Pain:  Vitals:   09/18/21 1001  TempSrc: Oral  PainSc: 0-No pain         Complications: No notable events documented.

## 2021-09-18 NOTE — Anesthesia Preprocedure Evaluation (Signed)
Anesthesia Evaluation  Patient identified by MRN, date of birth, ID band Patient awake    Reviewed: Allergy & Precautions, NPO status , Patient's Chart, lab work & pertinent test results  History of Anesthesia Complications Negative for: history of anesthetic complications  Airway Mallampati: II  TM Distance: >3 FB Neck ROM: Full    Dental  (+) Teeth Intact, Dental Advisory Given   Pulmonary neg pulmonary ROS,    breath sounds clear to auscultation       Cardiovascular Exercise Tolerance: Good negative cardio ROS   Rhythm:Regular Rate:Normal     Neuro/Psych  Neuromuscular disease    GI/Hepatic negative GI ROS, Neg liver ROS,   Endo/Other  Morbid obesity   Renal/GU negative Renal ROS     Musculoskeletal negative musculoskeletal ROS (+)   Abdominal (+) + obese,   Peds  Hematology negative hematology ROS (+)   Anesthesia Other Findings   Reproductive/Obstetrics                             Anesthesia Physical  Anesthesia Plan  ASA: III  Anesthesia Plan: General   Post-op Pain Management:    Induction: Intravenous  PONV Risk Score and Plan: 3 and Dexamethasone, Ondansetron, Treatment may vary due to age or medical condition and Midazolam  Airway Management Planned: LMA  Additional Equipment:   Intra-op Plan:   Post-operative Plan: Extubation in OR  Informed Consent: I have reviewed the patients History and Physical, chart, labs and discussed the procedure including the risks, benefits and alternatives for the proposed anesthesia with the patient or authorized representative who has indicated his/her understanding and acceptance.     Dental advisory given  Plan Discussed with: CRNA, Anesthesiologist and Surgeon  Anesthesia Plan Comments:         Anesthesia Quick Evaluation

## 2021-09-18 NOTE — Anesthesia Procedure Notes (Signed)
Procedure Name: LMA Insertion Date/Time: 09/18/2021 10:59 AM Performed by: Willa Frater, CRNA Pre-anesthesia Checklist: Patient identified, Emergency Drugs available, Suction available and Patient being monitored Patient Re-evaluated:Patient Re-evaluated prior to induction Oxygen Delivery Method: Circle system utilized Preoxygenation: Pre-oxygenation with 100% oxygen Induction Type: IV induction Ventilation: Mask ventilation without difficulty LMA: LMA inserted LMA Size: 4.0 Number of attempts: 1 Airway Equipment and Method: Bite block Placement Confirmation: positive ETCO2 Tube secured with: Tape Dental Injury: Teeth and Oropharynx as per pre-operative assessment

## 2021-09-18 NOTE — Interval H&P Note (Signed)
History and Physical Interval Note:  09/18/2021 10:40 AM  Cheryl [name]  has presented today for surgery, with the diagnosis of Malignant neoplasm of upper-outer quadrant of left breast.  The various methods of treatment have been discussed with the patient and family. After consideration of risks, benefits and other options for treatment, the patient has consented to  Procedure(s): Nipple reconstruction with tissue rearrangement (Bilateral) as a surgical intervention.  The patient's history has been reviewed, patient examined, no change in status, stable for surgery.  I have reviewed the patient's chart and labs.  Questions were answered to the patient's satisfaction.     [name] Tassie Pollett

## 2021-09-18 NOTE — Op Note (Signed)
DATE OF OPERATION: 09/18/2021  LOCATION: [facility]  PREOPERATIVE DIAGNOSIS: Acquired absence of bilateral breast nipples  POSTOPERATIVE DIAGNOSIS: Same  PROCEDURE: Local flap for recreation of nipples bilateral 1.5 x 3 cm each  SURGEON: Jabe Jeanbaptiste Sanger Yvonna Brun, DO  ASSISTANT: Lennice Sites, MD and Krista Blue, PA  EBL: none  CONDITION: Stable  COMPLICATIONS: None  INDICATION: The patient, Cheryl [name], is a 45 y.o. female born on 1976-08-01, is here for treatment of acquired absence of bilateral breasts.   PROCEDURE DETAILS:  The patient was seen prior to surgery and marked.  The IV antibiotics were given. The patient was taken to the operating room and given a general anesthetic. A standard time out was performed and all information was confirmed by those in the room. SCDs were placed.   The chest was prepped and draped.    Right:  The top hot pattern was drawn on the agreed location.  Intension for the base of the flap to have blood supply dictated the  direction of the marking to have the base superior.  The #15 blade was used to make the incisions.  The base was kept attached and the two arms were folded over to meet at the center.  A combination of 4-0 and 5-0 Chromic were used to suture the nipple.  The deep layer at the base laterally and medically were closed with the 3-0 PDS.  The 4-0 Chromic was used to close the limbs.   Left: The top hot pattern was drawn on the agreed location.  Intension for the base of the flap to have blood supply dictated the  direction of the marking to have the base superior.  The #15 blade was used to make the incisions.  The base was kept attached and the two arms were folded over to meet at the center.  A combination of 4-0 and 5-0 Chromic were used to suture the nipple.  The deep layer at the base laterally and medically were closed with the 3-0 PDS.  The 4-0 Chromic was used to close the limbs.  A shortened 50 cc syringe  was used a dressing with honeycomb dressings.  The patient was allowed to wake up and taken to recovery room in stable condition at the end of the case. The family was notified at the end of the case.   The advanced practice practitioner (APP) assisted throughout the case.  The APP was essential in retraction and counter traction when needed to make the case progress smoothly.  This retraction and assistance made it possible to see the tissue plans for the procedure.  The assistance was needed for blood control, tissue re-approximation and assisted with closure of the incision site.

## 2021-09-18 NOTE — Discharge Instructions (Addendum)
INSTRUCTIONS FOR AFTER SURGERY   You will likely have some questions about what to expect following your operation.  The following information will help you and your family understand what to expect when you are discharged from the hospital.  Following these guidelines will help ensure a smooth recovery and reduce risks of complications.  Postoperative instructions include information on: diet, wound care, medications and physical activity.  AFTER SURGERY Expect to go home after the procedure.  In some cases, you may need to spend one night in the hospital for observation.  DIET This surgery does not require a specific diet.  However, I have to mention that the healthier you eat the better your body can start healing. It is important to increasing your protein intake.  This means limiting the foods with added sugar.  Focus on fruits and vegetables and some meat. It is very important to drink water after your surgery.  If your urine is bright yellow, then it is concentrated, and you need to drink more water.  As a general rule after surgery, you should have 8 ounces of water every hour while awake.  If you find you are persistently nauseated or unable to take in liquids let us know.  NO TOBACCO USE or EXPOSURE.  This will slow your healing process and increase the risk of a wound.  WOUND CARE You can shower 3 days after surgery.  Use fragrance free soap.  Dial, [place], [place] and Cetaphil are usually mild on the skin.  If you have steri-strips / tape directly attached to your skin leave them in place. It is OK to get these wet.  No baths, pools or hot tubs for two weeks. We close your incision to leave the smallest and best-looking scar. No ointment or creams on your incisions until given the go ahead.  Especially not Neosporin (Too many skin reactions with this one).  A few weeks after surgery you can use Mederma and start massaging the scar.  ACTIVITY No heavy lifting until cleared by the doctor.  It  is OK to walk and climb stairs. In fact, moving your legs is very important to decrease your risk of a blood clot.  It will also help keep you from getting deconditioned.  Every 1 to 2 hours get up and walk for 5 minutes. This will help with a quicker recovery back to normal.  Let pain be your guide so you don't do too much.   WORK Everyone returns to work at different times. As a rough guide, most people take at least 1 - 2 weeks off prior to returning to work. If you need documentation for your job, bring the forms to your postoperative follow up visit.  DRIVING Arrange for someone to bring you home from the hospital.  You may be able to drive a few days after surgery but not while taking any narcotics or valium.  BOWEL MOVEMENTS Constipation can occur after anesthesia and while taking pain medication.  It is important to stay ahead for your comfort.  We recommend taking Milk of Magnesia (2 tablespoons; twice a day) while taking the pain pills.  SEROMA This is fluid your body tried to put in the surgical site.  This is normal but if it creates excessive pain and swelling let us know.  It usually decreases in a few weeks.  MEDICATIONS and PAIN CONTROL At your preoperative visit for you history and physical you were given the following medications: An antibiotic: Start this medication  when you get home and take according to the instructions on the bottle. Zofran 4 mg:  This is to treat nausea and vomiting.  You can take this every 6 hours as needed and only if needed. Norco (hydrocodone/acetaminophen) 5/325 mg:  This is only to be used after you have taken the motrin or the tylenol. Every 8 hours as needed. Over the counter Medication to take: Ibuprofen (Motrin) 600 mg:  Take this every 6 hours.  If you have additional pain then take 500 mg of the tylenol.  Only take the Norco after you have tried these two. Miralax or stool softener of choice: Take this according to the bottle if you take the  [place] Call your surgeon's office if any of the following occur:  Fever 101 degrees F or greater  Excessive bleeding or fluid from the incision site.  Pain that increases over time without aid from the medications  Redness, warmth, or pus draining from incision sites  Persistent nausea or inability to take in liquids  Severe misshapen area that underwent the operation.   Post Anesthesia Home Care Instructions  Activity: Get plenty of rest for the remainder of the day. A responsible individual must stay with you for 24 hours following the procedure.  For the next 24 hours, DO NOT: -Drive a car -Paediatric nurse -Drink alcoholic beverages -Take any medication unless instructed by your physician -Make any legal decisions or sign important papers.  Meals: Start with liquid foods such as gelatin or soup. Progress to regular foods as tolerated. Avoid greasy, spicy, heavy foods. If nausea and/or vomiting occur, drink only clear liquids until the nausea and/or vomiting subsides. Call your physician if vomiting continues.  Special Instructions/Symptoms: Your throat may feel dry or sore from the anesthesia or the breathing tube placed in your throat during surgery. If this causes discomfort, gargle with warm salt water. The discomfort should disappear within 24 hours.

## 2021-09-19 ENCOUNTER — Telehealth: Payer: Self-pay | Admitting: Plastic Surgery

## 2021-09-19 ENCOUNTER — Encounter (HOSPITAL_BASED_OUTPATIENT_CLINIC_OR_DEPARTMENT_OTHER): Payer: Self-pay | Admitting: Plastic Surgery

## 2021-09-19 NOTE — Telephone Encounter (Signed)
I spoke with patient on the telephone.  Bleeding is minimal.  [name] not having any discomfort.  [name] otherwise pleased.  She understands that the honeycomb dressing and nipple housing is to remain intact until she comes to clinic next week for postoperative follow-up appointment.

## 2021-09-19 NOTE — Telephone Encounter (Signed)
Patient would like to know when she can remove her wrappings. Patient also reports some bleeding from the incision. Sx 10/27. Please call to advise [phone]. Thank you.

## 2021-09-26 ENCOUNTER — Encounter: Payer: Self-pay | Admitting: Plastic Surgery

## 2021-09-26 ENCOUNTER — Ambulatory Visit (INDEPENDENT_AMBULATORY_CARE_PROVIDER_SITE_OTHER): Payer: 59 | Admitting: Plastic Surgery

## 2021-09-26 ENCOUNTER — Other Ambulatory Visit: Payer: Self-pay

## 2021-09-26 DIAGNOSIS — Z9889 Other specified postprocedural states: Secondary | ICD-10-CM

## 2021-09-26 NOTE — Progress Notes (Signed)
The patient is a 45 year old female here for follow-up on her nipple reconstruction.  She had the cones on since the surgery.  We removed those today.  Everything looks good and alive.  [name] very pleased.  I instructed her to use the Xeroform daily and follow-up in 2 weeks.  We will get pictures at her next visit.

## 2021-09-30 ENCOUNTER — Ambulatory Visit: Payer: 59 | Admitting: Plastic Surgery

## 2021-10-07 ENCOUNTER — Encounter: Payer: 59 | Admitting: Plastic Surgery

## 2021-10-22 NOTE — Progress Notes (Deleted)
Patient is a 45 year old female with PMH of left-sided breast cancer with radiation and subsequent bilateral mastectomies s/p latissimus muscle flap reconstruction and most recently bilateral nipple reconstruction with tissue rearrangement performed 09/18/2021 by Dr. Marla Roe who presents to clinic for postoperative follow-up.  She was seen most recently on 09/26/2021.  At that time, the protective nipple housings were removed and everything looked healthy and alive.  Plan is for daily Xeroform dressings and follow-up for pictures in 2 weeks.  Today,

## 2021-10-23 ENCOUNTER — Ambulatory Visit (INDEPENDENT_AMBULATORY_CARE_PROVIDER_SITE_OTHER): Payer: 59 | Admitting: Nurse Practitioner

## 2021-10-23 ENCOUNTER — Other Ambulatory Visit: Payer: Self-pay

## 2021-10-23 ENCOUNTER — Encounter: Payer: Self-pay | Admitting: Nurse Practitioner

## 2021-10-23 VITALS — BP 118/79 | HR 80 | Temp 98.5°F | Ht 61.0 in | Wt 230.6 lb

## 2021-10-23 DIAGNOSIS — J209 Acute bronchitis, unspecified: Secondary | ICD-10-CM

## 2021-10-23 MED ORDER — METHYLPREDNISOLONE 4 MG PO TBPK: ORAL_TABLET | ORAL | 0 refills | Status: DC

## 2021-10-23 MED ORDER — METHYLPREDNISOLONE SODIUM SUCC 125 MG IJ SOLR
125.0000 mg | Freq: Once | INTRAMUSCULAR | Status: AC
  Administered 2021-10-23: 125 mg via INTRAMUSCULAR

## 2021-10-23 NOTE — Progress Notes (Signed)
Acute Office Visit  Subjective:    Patient ID: Cheryl [name], female    DOB: Apr 03, 1976, 45 y.o.   MRN: [id number]  Chief Complaint  Patient presents with   Cough    Cough This is a new problem. The current episode started 1 to 4 weeks ago. The problem has been unchanged. The problem occurs every few minutes. The cough is Non-productive. Associated symptoms include chest pain. Pertinent negatives include no chills, fever, headaches, myalgias, postnasal drip, rash, rhinorrhea, sore throat, shortness of breath or wheezing. Associated symptoms comments: Was initially sick when cough started. Other symptoms have resolved, but cough has been persistent. She did take a COVID 19 test when she was sick and it was negative . Nothing aggravates the symptoms. She has tried OTC cough suppressant for the symptoms. The treatment provided mild relief. There is no history of environmental allergies.   Past Medical History:  Diagnosis Date   Allergy    pollen   Breast cancer ([place]) 2016 and 2018   left breast, surgery and chemo done 2018   Cellulitis 07/25/2018   left breast   Pre-diabetes    SVD (spontaneous vaginal delivery)    x 3   Wears contact lenses     Past Surgical History:  Procedure Laterality Date   ABDOMINAL HYSTERECTOMY     BILATERAL TOTAL MASTECTOMY WITH AXILLARY LYMPH NODE DISSECTION  04/20/2017   BREAST BIOPSY Left 02/18/2015   BREAST BIOPSY Right 02/18/2015   BREAST EXCISIONAL BIOPSY Left 04/05/2015   BREAST IMPLANT REMOVAL Left 09/15/2018   Procedure: REMOVAL BREAST IMPLANTS;  Surgeon: Wallace Going, DO;  Location: [facility];  Service: Plastics;  Laterality: Left;   BREAST LUMPECTOMY Left 03/27/2015   BREAST LUMPECTOMY WITH NEEDLE LOCALIZATION AND AXILLARY SENTINEL LYMPH NODE BX Left 03/27/2015   Procedure: LEFT BREAST LUMPECTOMY WITH BRACKETED NEEDLE LOCALIZATION AND LEFT  AXILLARY SENTINEL LYMPH NODE BX;  Surgeon: [name], MD;  Location: [facility];  Service: General;  Laterality: Left;   BREAST RECONSTRUCTION Left 03/06/2021   Procedure: revision of left breast flap;  Surgeon: Wallace Going, DO;  Location: [place] [facility];  Service: Plastics;  Laterality: Left;   BREAST RECONSTRUCTION Bilateral 09/18/2021   Procedure: Bilateral Nipple reconstruction with tissue rearrangement;  Surgeon: Wallace Going, DO;  Location: [facility];  Service: Plastics;  Laterality: Bilateral;   BREAST RECONSTRUCTION WITH PLACEMENT OF TISSUE EXPANDER AND FLEX HD (ACELLULAR HYDRATED DERMIS) Bilateral 04/20/2017   Procedure: BILATARAL BREAST RECONSTRUCTION WITH PLACEMENT OF TISSUE EXPANDER AND FLEX HD (ACELLULAR HYDRATED DERMIS);  Surgeon: Wallace Going, DO;  Location: [place];  Service: Plastics;  Laterality: Bilateral;   BREAST REDUCTION SURGERY Right 12/28/2018   Procedure: Liposuction of medial right breast for improved symmetry;  Surgeon: Wallace Going, DO;  Location: [facility];  Service: Plastics;  Laterality: Right;   BREAST SURGERY Bilateral    masectomy   CHOLECYSTECTOMY N/A 07/07/2017   Procedure: LAPAROSCOPIC CHOLECYSTECTOMY WITH INTRAOPERATIVE CHOLANGIOGRAM;  Surgeon: [name], MD;  Location: [facility];  Service: General;  Laterality: N/A;   Insertion of Essure  yrs ago   For Birth control   [place] Bilateral 09/08/2018   Procedure: LAPAROSCOPIC ASSISTED VAGINAL HYSTERECTOMY WITH SALPINGO OOPHORECTOMY;  Surgeon: [name], MD;  Location: [facility];  Service: Gynecology;  Laterality: Bilateral;   LATISSIMUS FLAP TO BREAST Left 09/15/2018   Procedure: LATISSIMUS FLAP TO LEFT BREAST;  Surgeon: Wallace Going, DO;  Location: [facility];  Service: Plastics;  Laterality: Left;   LIPOSUCTION WITH LIPOFILLING Bilateral 05/20/2018   Procedure: Bilateral breast fat grafting from abdomen to improve  symmetry following breast reconstruction;  Surgeon: Wallace Going, DO;  Location: [facility];  Service: Plastics;  Laterality: Bilateral;   LIPOSUCTION WITH LIPOFILLING Bilateral 03/06/2021   Procedure: Lipo filling and liposuction for improved symmetry;  Surgeon: Wallace Going, DO;  Location: [place];  Service: Plastics;  Laterality: Bilateral;   MASTECTOMY W/ SENTINEL NODE BIOPSY Bilateral 04/20/2017   Procedure: BILATERAL TOTAL MASTECTOMY WITH LEFT AXILLARY SENTINEL LYMPH NODE BIOPSY;  Surgeon: [name], MD;  Location: [facility];  Service: General;  Laterality: Bilateral;   PORTA CATH REMOVAL Right 11/04/2017   Procedure: PORTA CATH REMOVAL;  Surgeon: Wallace Going, DO;  Location: [place];  Service: Plastics;  Laterality: Right;   PORTACATH PLACEMENT Right 04/25/2015   Procedure: INSERTION PORT-A-CATH;  Surgeon: [name], MD;  Location: [place];  Service: General;  Laterality: Right;, renoved in 2016   PORTACATH PLACEMENT Right 04/20/2017   Procedure: INSERTION PORT-A-CATH;  Surgeon: [name], MD;  Location: [place];  Service: General;  Laterality: Right;   RE-EXCISION OF BREAST LUMPECTOMY Left 04/05/2015   Procedure: RE-EXCISION LEFT  BREAST LUMPECTOMY;  Surgeon: [name], MD;  Location: [place];  Service: General;  Laterality: Left;   REMOVAL OF TISSUE EXPANDER AND PLACEMENT OF IMPLANT Bilateral 11/04/2017   Procedure: BILATERAL REMOVAL OF TISSUE EXPANDER AND PLACEMENT OF  BILATERAL BREAST IMPLANT;  Surgeon: Wallace Going, DO;  Location: [facility];  Service: Plastics;  Laterality: Bilateral;   REMOVAL OF TISSUE EXPANDER AND PLACEMENT OF IMPLANT Left 12/28/2018   Procedure: Removal of left breast expander and placement of silicone implant.;  Surgeon: Wallace Going, DO;  Location: [place];  Service: Plastics;   Laterality: Left;   TISSUE EXPANDER PLACEMENT Left 09/15/2018   Procedure: PLACEMENT OF TISSUE EXPANDER;  Surgeon: Wallace Going, DO;  Location: [place] [facility];  Service: Plastics;  Laterality: Left;    Family History  Problem Relation Age of Onset   Esophageal cancer Paternal Uncle    Lung cancer Maternal Grandmother 68       non smoker   Lung cancer Maternal Grandfather 44       non smoker worked in [place] Paternal Grandfather 35       throat cancer ? smoker   Colon cancer Neg Hx    Colon polyps Neg Hx    Rectal cancer Neg Hx    Stomach cancer Neg Hx     Social History   Socioeconomic History   Marital status: Divorced    Spouse name: Not on file   Number of children: 3   Years of education: Not on file   Highest education level: Not on file  Occupational History   Not on file  Tobacco Use   Smoking status: Never    Passive exposure: Past (dad smoked as a child)   Smokeless tobacco: Never  Vaping Use   Vaping Use: Never used  Substance and Sexual Activity   Alcohol use: Yes    Alcohol/week: 0.0 standard drinks    Comment: socially twice month   Drug use: No   Sexual activity: Yes    Birth control/protection: Surgical    Comment: Essure   Other Topics Concern  Not on file  Social History Narrative   Not on file   Social Determinants of Health   Financial Resource Strain: Not on file  Food Insecurity: Not on file  Transportation Needs: Not on file  Physical Activity: Not on file  Stress: Not on file  Social Connections: Not on file  Intimate Partner Violence: Not on file    Outpatient Medications Prior to Visit  Medication Sig Dispense Refill   anastrozole (ARIMIDEX) 1 MG tablet TAKE 1 TABLET BY MOUTH EVERY DAY 90 tablet 4   b complex vitamins tablet Take 1 tablet by mouth daily.     loratadine (CLARITIN) 10 MG tablet Take 10 mg by mouth daily.     ondansetron (ZOFRAN ODT) 4 MG disintegrating tablet Take 1 tablet (4 mg total) by mouth every  8 (eight) hours as needed for nausea or vomiting. (Patient not taking: Reported on 10/23/2021) 20 tablet 0   Vitamin D, Ergocalciferol, (DRISDOL) 1.25 MG (50000 UNIT) CAPS capsule TAKE 1 CAPSULE (50,000 UNITS TOTAL) BY MOUTH EVERY 7 (SEVEN) DAYS. 12 capsule 4   No facility-administered medications prior to visit.    Allergies  Allergen Reactions   Adhesive [Tape] Other (See Comments)    Band Aids causes Skin blisters.  All other tape is okay to use    Review of Systems  Constitutional:  Positive for fatigue. Negative for activity change, appetite change, chills and fever.  HENT:  Positive for congestion. Negative for postnasal drip, rhinorrhea, sinus pressure, sinus pain, sneezing and sore throat.   Eyes: Negative.   Respiratory:  Positive for cough. Negative for chest tightness, shortness of breath and wheezing.   Cardiovascular:  Positive for chest pain. Negative for palpitations.  Gastrointestinal:  Negative for abdominal pain, constipation, diarrhea, nausea and vomiting.  Endocrine: Negative for cold intolerance, heat intolerance, polydipsia and polyuria.  Genitourinary:  Negative for dyspareunia, dysuria, flank pain, frequency and urgency.  Musculoskeletal:  Negative for arthralgias, back pain and myalgias.  Skin:  Negative for rash.  Allergic/Immunologic: Negative for environmental allergies.  Neurological:  Negative for dizziness, weakness and headaches.  Hematological:  Negative for adenopathy.  Psychiatric/Behavioral:  The patient is not nervous/anxious.       Objective:    Physical Exam Vitals and nursing note reviewed.  Constitutional:      Appearance: Normal appearance. [name] well-developed.  HENT:     Head: Normocephalic and atraumatic.     Right Ear: Tympanic membrane is bulging.     Left Ear: Tympanic membrane is bulging.     Nose: No congestion or rhinorrhea.     Mouth/Throat:     Pharynx: No posterior oropharyngeal erythema.  Eyes:     Pupils: Pupils are  equal, round, and reactive to light.  Cardiovascular:     Rate and Rhythm: Normal rate and regular rhythm.     Pulses: Normal pulses.     Heart sounds: Normal heart sounds.  Pulmonary:     Effort: Pulmonary effort is normal.     Breath sounds: Normal breath sounds.     Comments: Dry, harsh cough is auscultated during today's visit  Abdominal:     Palpations: Abdomen is soft.  Musculoskeletal:        General: Normal range of motion.     Cervical back: Normal range of motion and neck supple.  Lymphadenopathy:     Cervical: No cervical adenopathy.  Skin:    General: Skin is warm and dry.     Capillary Refill:  Capillary refill takes less than 2 seconds.  Neurological:     General: No focal deficit present.     Mental Status: [name] alert and oriented to person, place, and time.  Psychiatric:        Mood and Affect: Mood normal.        Behavior: Behavior normal.        Thought Content: Thought content normal.        Judgment: Judgment normal.    Today's Vitals   10/23/21 0919  BP: 118/79  Pulse: 80  Temp: 98.5 F (36.9 C)  SpO2: 97%  Weight: 230 lb 9.6 oz (104.6 kg)  Height: _0  (1.549 m)   Body mass index is 43.57 kg/m.   Wt Readings from Last 3 Encounters:  10/23/21 230 lb 9.6 oz (104.6 kg)  09/18/21 221 lb 12.5 oz (100.6 kg)  09/02/21 222 lb 12.8 oz (101.1 kg)    Health Maintenance Due  Topic Date Due   COVID-19 Vaccine (1) Never done   Pneumococcal Vaccine 68-59 Years old (1 - PCV) Never done   Hepatitis C Screening  Never done   TETANUS/TDAP  Never done   PAP SMEAR-Modifier  02/26/2021   INFLUENZA VACCINE  Never done    There are no preventive care reminders to display for this patient.   Lab Results  Component Value Date   TSH 1.380 07/19/2020   Lab Results  Component Value Date   WBC 6.9 01/02/2021   HGB 13.4 01/02/2021   HCT 40.3 01/02/2021   MCV 90.2 01/02/2021   PLT 357 01/02/2021   Lab Results  Component Value Date   NA 139 01/02/2021    K 4.0 01/02/2021   CHLORIDE 105 10/21/2017   CO2 23 01/02/2021   GLUCOSE 99 01/02/2021   BUN 12 01/02/2021   CREATININE 0.84 01/02/2021   BILITOT 0.9 01/02/2021   ALKPHOS 94 01/02/2021   AST 28 01/02/2021   ALT 32 01/02/2021   PROT 7.7 01/02/2021   ALBUMIN 4.1 01/02/2021   CALCIUM 9.3 01/02/2021   ANIONGAP 11 01/02/2021   EGFR >60 10/21/2017   Lab Results  Component Value Date   CHOL 253 (H) 07/19/2020   Lab Results  Component Value Date   HDL 54 07/19/2020   Lab Results  Component Value Date   LDLCALC 177 (H) 07/19/2020   Lab Results  Component Value Date   TRIG 124 07/19/2020   Lab Results  Component Value Date   CHOLHDL 4.7 (H) 07/19/2020   Lab Results  Component Value Date   HGBA1C 6.0 (H) 07/19/2020       Assessment & Plan:  1. Acute bronchitis, unspecified organism Solu-medrol 130m IM injection administered today. Start medrol taper tomorrow. Take as directed for 6 days. Continue to take OTC cough suppressants as needed and as indicated.  - methylPREDNISolone (MEDROL) 4 MG TBPK tablet; Take by mouth as directed for 6 days  Dispense: 21 tablet; Refill: 0 - methylPREDNISolone sodium succinate (SOLU-MEDROL) 125 mg/2 mL injection 125 mg   Problem List Items Addressed This Visit       Respiratory   Acute bronchitis - Primary   Relevant Medications   methylPREDNISolone (MEDROL) 4 MG TBPK tablet     Meds ordered this encounter  Medications   methylPREDNISolone (MEDROL) 4 MG TBPK tablet    Sig: Take by mouth as directed for 6 days    Dispense:  21 tablet    Refill:  0    Order  Specific Question:   Supervising Provider    Answer:   Beatrice Lecher D [[id number]]   methylPREDNISolone sodium succinate (SOLU-MEDROL) 125 mg/2 mL injection 125 mg     Ronnell Freshwater, NP

## 2021-10-24 ENCOUNTER — Encounter: Payer: 59 | Admitting: Physician Assistant

## 2022-01-08 ENCOUNTER — Encounter: Payer: Self-pay | Admitting: Physician Assistant

## 2022-01-08 ENCOUNTER — Other Ambulatory Visit: Payer: Self-pay

## 2022-01-08 ENCOUNTER — Telehealth: Payer: Self-pay | Admitting: Hematology and Oncology

## 2022-01-08 ENCOUNTER — Other Ambulatory Visit (INDEPENDENT_AMBULATORY_CARE_PROVIDER_SITE_OTHER): Payer: 59

## 2022-01-08 ENCOUNTER — Other Ambulatory Visit: Payer: Self-pay | Admitting: *Deleted

## 2022-01-08 DIAGNOSIS — R300 Dysuria: Secondary | ICD-10-CM | POA: Diagnosis not present

## 2022-01-08 DIAGNOSIS — R109 Unspecified abdominal pain: Secondary | ICD-10-CM

## 2022-01-08 DIAGNOSIS — C50412 Malignant neoplasm of upper-outer quadrant of left female breast: Secondary | ICD-10-CM

## 2022-01-08 LAB — POCT URINALYSIS DIPSTICK
Bilirubin, UA: NEGATIVE
Blood, UA: NEGATIVE
Glucose, UA: NEGATIVE
Ketones, UA: NEGATIVE
Leukocytes, UA: NEGATIVE
Nitrite, UA: NEGATIVE
Protein, UA: NEGATIVE
Spec Grav, UA: 1.025 (ref 1.010–1.025)
Urobilinogen, UA: 0.2 E.U./dL
pH, UA: 6 (ref 5.0–8.0)

## 2022-01-08 NOTE — Progress Notes (Signed)
HPI - Patient came in to give urine sample. She had been experiencing urine frequency, pressure, and bilateral back pain since this past Sunday.   Assessment and Plan - The UA came back normal and we collected urine for a urine culture. Tried calling patient to discuss results and a referral to Urology if patient was agreeable. Left a voicemail for her to call back.

## 2022-01-08 NOTE — Telephone Encounter (Signed)
Sch per 2/17 in[place]sket, left msg

## 2022-01-09 ENCOUNTER — Inpatient Hospital Stay: Payer: 59 | Admitting: Hematology and Oncology

## 2022-01-09 ENCOUNTER — Inpatient Hospital Stay: Payer: 59

## 2022-01-12 ENCOUNTER — Encounter: Payer: Self-pay | Admitting: Oncology

## 2022-01-12 LAB — URINE CULTURE

## 2022-01-13 ENCOUNTER — Ambulatory Visit: Payer: 59 | Admitting: Surgical

## 2022-01-13 ENCOUNTER — Other Ambulatory Visit: Payer: Self-pay

## 2022-01-13 DIAGNOSIS — Z9013 Acquired absence of bilateral breasts and nipples: Secondary | ICD-10-CM

## 2022-01-13 DIAGNOSIS — N651 Disproportion of reconstructed breast: Secondary | ICD-10-CM

## 2022-01-13 DIAGNOSIS — Z719 Counseling, unspecified: Secondary | ICD-10-CM

## 2022-01-13 DIAGNOSIS — Z9889 Other specified postprocedural states: Secondary | ICD-10-CM | POA: Diagnosis not present

## 2022-01-13 NOTE — Progress Notes (Signed)
NIPPLE AREOLAR TATTOO PROCEDURE  PREOPERATIVE DIAGNOSIS:  Acquired absence of bilateral nipple areola   POSTOPERATIVE DIAGNOSIS: Acquired absence of bilateral nipple areola  PROCEDURES: bilateral nipple areolar tattoo   ANESTHESIA:  23% Lidocaine, 7% tetracaine  COMPLICATIONS: None.  JUSTIFICATION FOR PROCEDURE:  Madhavi Hamblen is a 46 y.o. female with a history of breast cancer status post breast reconstruction. The patient presents for nipple areolar complex tattoo. Risks, benefits, indications, and alternatives of the above described procedures were discussed with the patient and all the patient's questions were answered. Consent was signed.  DESCRIPTION OF PROCEDURE: After informed consent was obtained and proper identification of patient and surgical site was made, the patient was taken to the procedure room and resting in procedure chair. The patient was prepped and draped in the usual sterile fashion. Attention was turned to the selection of flesh colored permanent tattoo ink to produce the appropriate color for nipple areolar complex tattooing. Color, size and location was confirmed with the patient. Pre-op photos were obtained and placed in patient chart with patient consent. Using a Digital Revo 7R pronged tattoo head, pigment was instilled to the designed nipple areolar complex, which was confirmed preoperatively with the patient. Once adequate pigment had been applied to the nipple areolar complex, vaseline followed by an occlusive dressing was applied. The patient tolerated the procedure well. There were no complications  A 3:7:9 ratio of Bright Peach:Warm Peach:Fair Peach was used to create the desired color. 2 part Bright Peach -  Exp 11/2024  2 part Warm Peach -  Exp 11/2024  1 part [place]-  Exp 11/2024

## 2022-01-14 ENCOUNTER — Ambulatory Visit: Payer: 59 | Admitting: Urology

## 2022-01-14 ENCOUNTER — Encounter: Payer: Self-pay | Admitting: Urology

## 2022-01-14 VITALS — BP 141/87 | HR 92 | Ht 62.0 in | Wt 220.0 lb

## 2022-01-14 DIAGNOSIS — R350 Frequency of micturition: Secondary | ICD-10-CM

## 2022-01-14 DIAGNOSIS — Z87440 Personal history of urinary (tract) infections: Secondary | ICD-10-CM

## 2022-01-14 DIAGNOSIS — R109 Unspecified abdominal pain: Secondary | ICD-10-CM

## 2022-01-14 LAB — URINALYSIS, COMPLETE
Bilirubin, UA: NEGATIVE
Ketones, UA: NEGATIVE
Leukocytes,UA: NEGATIVE
Nitrite, UA: POSITIVE — AB
Protein,UA: NEGATIVE
RBC, UA: NEGATIVE
Specific Gravity, UA: 1.02 (ref 1.005–1.030)
Urobilinogen, Ur: 0.2 mg/dL (ref 0.2–1.0)
pH, UA: 7 (ref 5.0–7.5)

## 2022-01-14 LAB — MICROSCOPIC EXAMINATION

## 2022-01-14 NOTE — Progress Notes (Signed)
01/14/22 9:51 AM   Cheryl [name] [birth year] [id number]  CC: Urinary frequency, bilateral flank pain  HPI: I saw [name] today for the above issues.  [name] a 46 year old female who reports 2 weeks of intermittent bilateral flank pain, worse on the left side, as well as urinary frequency.  She was seen by PCP who did a dipstick urinalysis which was completely benign, and culture was negative, and she was referred to urology.  She denies any gross hematuria or dysuria.  [name] also having some abdominal discomfort.  She denies any history of kidney stones.  CT abdomen and pelvis in 2019 showed no urologic abnormalities.  She denies any changes in diet or recent stressors.  She drinks a fair amount of caffeine and diet soda/diet juice during the day.  Urinalysis today 0-5 WBCs, 0-2 RBCs, moderate bacteria, no yeast, nitrite positive(on Azo), negative leukocytes, trace glucose.    PMH: Past Medical History:  Diagnosis Date   Allergy    pollen   Breast cancer ([facility]) 2016 and 2018   left breast, surgery and chemo done 2018   Cellulitis 07/25/2018   left breast   Pre-diabetes    SVD (spontaneous vaginal delivery)    x 3   Wears contact lenses       Family History: Family History  Problem Relation Age of Onset   Esophageal cancer Paternal Uncle    Lung cancer Maternal Grandmother 13       non smoker   Lung cancer Maternal Grandfather 39       non smoker worked in [place] Paternal Grandfather 35       throat cancer ? smoker   Colon cancer Neg Hx    Colon polyps Neg Hx    Rectal cancer Neg Hx    Stomach cancer Neg Hx     Social History:  reports that she has never smoked. She has been exposed to tobacco smoke. She has never used smokeless tobacco. She reports current alcohol use. She reports that she does not use drugs.  Physical Exam: BP (!) 141/87    Pulse 92    Ht 5\' 2"  (1.575 m)    Wt 220 lb (99.8 kg)    LMP 04/02/2015 (Approximate)  Comment: after chemo - no periods   BMI 40.24 kg/m    Constitutional:  Alert and oriented, No acute distress. Cardiovascular: No clubbing, cyanosis, or edema. Respiratory: Normal respiratory effort, no increased work of breathing. GI: Abdomen is soft, nontender, nondistended, no abdominal masses   Laboratory Data: Reviewed, see HPI  Pertinent Imaging: I have personally viewed and interpreted the CT abdomen pelvis from 2019 that shows no stones or other urologic abnormalities.  Assessment & Plan:   46 year old female with 2 weeks of urinary frequency and intermittent bilateral back pain and abdominal pain of unclear etiology.  Recent urine culture with PCP negative, and urinalysis today essentially benign.  I recommended starting by avoiding diet drinks and artificial sweeteners, increasing fluid intake, taking Azo as needed, and 1 week of NSAIDs.  Consider further evaluation with CT if persistent symptoms despite above strategies, we will follow-up urine cultures.  Recommended pushing fluids, avoiding bladder irritants, trial of NSAIDs Call with urine culture results, sent for atypicals as well Consider CT or pelvic floor physical therapy in the future if persistent symptoms   Nickolas Madrid, MD 01/14/2022  [facility] [address] [place], [place] [address] [phone]

## 2022-01-14 NOTE — Patient Instructions (Signed)
Pelvic Floor Dysfunction, Female Pelvic floor dysfunction (PFD) is a condition that results when the group of muscles and connective tissues that support the organs in the pelvis (pelvic floor muscles) do not work well. These muscles and their connections form a sling that supports the colon and bladder. In women, they also support the uterus. PFD causes pelvic floor muscles to be too weak, too tight, or both. In PFD, muscle movements are not coordinated. This may cause bowel or bladder problems. It may also cause pain. What are the causes? This condition may be caused by an injury to the pelvic area or by a weakening of pelvic muscles. This often results from pregnancy and childbirth or other types of strain. In many cases, the exact cause is not known. What increases the risk? The following factors may make you more likely to develop this condition: Having chronic bladder tissue inflammation (interstitial cystitis). Being an older person. Being overweight. History of radiation treatment for cancer in the pelvic region. Previous pelvic surgery, such as removal of the uterus (hysterectomy). What are the signs or symptoms? Symptoms of this condition vary and may include: Bladder symptoms, such as: Trouble starting urination and emptying the bladder. Frequent urinary tract infections. Leaking urine when coughing, laughing, or exercising (stress incontinence). Having to pass urine urgently or frequently. Pain when passing urine. Bowel symptoms, such as: Constipation. Urgent or frequent bowel movements. Incomplete bowel movements. Painful bowel movements. Leaking stool or gas. Unexplained genital or rectal pain. Genital or rectal muscle spasms. Low back pain. Other symptoms may include: A heavy, full, or aching feeling in the vagina. A bulge that protrudes into the vagina. Pain during or after sex. How is this diagnosed? This condition may be diagnosed based on: Your symptoms and  medical history. A physical exam. During the exam, your health care provider may check your pelvic muscles for tightness, spasm, pain, or weakness. This may include a rectal exam and a pelvic exam. In some cases, you may have diagnostic tests, such as: Electrical muscle function tests. Urine flow testing. X-ray tests of bowel function. Ultrasound of the pelvic organs. How is this treated? Treatment for this condition depends on the symptoms. Treatment options include: Physical therapy. This may include Kegel exercises to help relax or strengthen the pelvic floor muscles. Biofeedback. This type of therapy provides feedback on how tight your pelvic floor muscles are so that you can learn to control them. Internal or external massage therapy. A treatment that involves electrical stimulation of the pelvic floor muscles to help control pain (transcutaneous electrical nerve stimulation, or TENS). Sound wave therapy (ultrasound) to reduce muscle spasms. Medicines, such as: Muscle relaxants. Bladder control medicines. Surgery to reconstruct or support pelvic floor muscles may be an option if other treatments do not help. Follow these instructions at home: Activity Do your usual activities as told by your health care provider. Ask your health care provider if you should modify any activities. Do pelvic floor strengthening or relaxing exercises at home as told by your physical therapist. Lifestyle Maintain a healthy weight. Eat foods that are high in fiber, such as beans, whole grains, and fresh fruits and vegetables. Limit foods that are high in fat and processed sugars, such as fried or sweet foods. Manage stress with relaxation techniques such as yoga or meditation. General instructions If you have problems with leakage: Use absorbable pads or wear padded underwear. Wash frequently with mild soap. Keep your genital and anal area as clean and dry as possible.  Ask your health care provider if  you should try a barrier cream to prevent skin irritation. Take warm baths to relieve pelvic muscle tension or spasms. Take over-the-counter and prescription medicines only as told by your health care provider. Keep all follow-up visits. How is this prevented? The cause of PFD is not always known, but there are a few things you can do to reduce the risk of developing this condition, including: Staying at a healthy weight. Getting regular exercise. Managing stress. Contact a health care provider if: Your symptoms are not improving with home care. You have signs or symptoms of PFD that get worse at home. You develop new signs or symptoms. You have signs of a urinary tract infection, such as: Fever. Chills. Increased urinary frequency. A burning feeling when urinating. You have not had a bowel movement in 3 days (constipation). Summary Pelvic floor dysfunction results when the muscles and connective tissues in your pelvic floor do not work well. These muscles and their connections form a sling that supports your colon and bladder. In women, they also support the uterus. PFD may be caused by an injury to the pelvic area or by a weakening of pelvic muscles. PFD causes pelvic floor muscles to be too weak, too tight, or a combination of both. Symptoms may vary from person to person. In most cases, PFD can be treated with physical therapies and medicines. Surgery may be an option if other treatments do not help. This information is not intended to replace advice given to you by your health care provider. Make sure you discuss any questions you have with your health care provider. Document Revised: 03/19/2021 Document Reviewed: 03/19/2021 Elsevier Patient Education  [place].

## 2022-01-15 ENCOUNTER — Emergency Department (HOSPITAL_BASED_OUTPATIENT_CLINIC_OR_DEPARTMENT_OTHER)
Admission: EM | Admit: 2022-01-15 | Discharge: 2022-01-15 | Disposition: A | Discharge status: Home / Self Care | Payer: 59 | Attending: Emergency Medicine | Admitting: Emergency Medicine

## 2022-01-15 ENCOUNTER — Encounter (HOSPITAL_BASED_OUTPATIENT_CLINIC_OR_DEPARTMENT_OTHER): Payer: Self-pay | Admitting: Emergency Medicine

## 2022-01-15 ENCOUNTER — Other Ambulatory Visit: Payer: Self-pay

## 2022-01-15 DIAGNOSIS — N3000 Acute cystitis without hematuria: Secondary | ICD-10-CM | POA: Insufficient documentation

## 2022-01-15 DIAGNOSIS — Z9049 Acquired absence of other specified parts of digestive tract: Secondary | ICD-10-CM | POA: Diagnosis not present

## 2022-01-15 DIAGNOSIS — N3090 Cystitis, unspecified without hematuria: Secondary | ICD-10-CM

## 2022-01-15 DIAGNOSIS — R1030 Lower abdominal pain, unspecified: Secondary | ICD-10-CM | POA: Diagnosis present

## 2022-01-15 LAB — URINALYSIS, ROUTINE W REFLEX MICROSCOPIC
Bilirubin Urine: NEGATIVE
Glucose, UA: NEGATIVE mg/dL
Hgb urine dipstick: NEGATIVE
Ketones, ur: NEGATIVE mg/dL
Leukocytes,Ua: NEGATIVE
Nitrite: POSITIVE — AB
Protein, ur: NEGATIVE mg/dL
Specific Gravity, Urine: 1.025 (ref 1.005–1.030)
pH: 5.5 (ref 5.0–8.0)

## 2022-01-15 LAB — CBC WITH DIFFERENTIAL/PLATELET
Abs Immature Granulocytes: 0.01 10*3/uL (ref 0.00–0.07)
Basophils Absolute: 0 10*3/uL (ref 0.0–0.1)
Basophils Relative: 1 %
Eosinophils Absolute: 0.2 10*3/uL (ref 0.0–0.5)
Eosinophils Relative: 2 %
HCT: 37 % (ref 36.0–46.0)
Hemoglobin: 12 g/dL (ref 12.0–15.0)
Immature Granulocytes: 0 %
Lymphocytes Relative: 31 %
Lymphs Abs: 2.7 10*3/uL (ref 0.7–4.0)
MCH: 29 pg (ref 26.0–34.0)
MCHC: 32.4 g/dL (ref 30.0–36.0)
MCV: 89.4 fL (ref 80.0–100.0)
Monocytes Absolute: 0.5 10*3/uL (ref 0.1–1.0)
Monocytes Relative: 6 %
Neutro Abs: 5.1 10*3/uL (ref 1.7–7.7)
Neutrophils Relative %: 60 %
Platelets: 280 10*3/uL (ref 150–400)
RBC: 4.14 MIL/uL (ref 3.87–5.11)
RDW: 12.7 % (ref 11.5–15.5)
WBC: 8.5 10*3/uL (ref 4.0–10.5)
nRBC: 0 % (ref 0.0–0.2)

## 2022-01-15 LAB — COMPREHENSIVE METABOLIC PANEL
ALT: 43 U/L (ref 0–44)
AST: 34 U/L (ref 15–41)
Albumin: 4.2 g/dL (ref 3.5–5.0)
Alkaline Phosphatase: 84 U/L (ref 38–126)
Anion gap: 11 (ref 5–15)
BUN: 17 mg/dL (ref 6–20)
CO2: 26 mmol/L (ref 22–32)
Calcium: 9.6 mg/dL (ref 8.9–10.3)
Chloride: 103 mmol/L (ref 98–111)
Creatinine, Ser: 0.86 mg/dL (ref 0.44–1.00)
GFR, Estimated: >60 mL/min (ref >60)
Glucose, Bld: 124 mg/dL — ABNORMAL HIGH (ref 70–99)
Potassium: 3.7 mmol/L (ref 3.5–5.1)
Sodium: 140 mmol/L (ref 135–145)
Total Bilirubin: 0.8 mg/dL (ref 0.3–1.2)
Total Protein: 6.6 g/dL (ref 6.5–8.1)

## 2022-01-15 LAB — LIPASE, BLOOD: Lipase: 35 U/L (ref 11–51)

## 2022-01-15 MED ORDER — CEPHALEXIN 250 MG PO CAPS
500.0000 mg | ORAL_CAPSULE | Freq: Once | ORAL | Status: AC
  Administered 2022-01-15: 500 mg via ORAL
  Filled 2022-01-15: qty 2

## 2022-01-15 MED ORDER — KETOROLAC TROMETHAMINE 30 MG/ML IJ SOLN
30.0000 mg | Freq: Once | INTRAMUSCULAR | Status: AC
  Administered 2022-01-15: 30 mg via INTRAVENOUS
  Filled 2022-01-15: qty 1

## 2022-01-15 MED ORDER — CEPHALEXIN 500 MG PO CAPS: 500.0000 mg | ORAL_CAPSULE | Freq: Two times a day (BID) | ORAL | 0 refills | Status: DC

## 2022-01-15 MED ORDER — ONDANSETRON HCL 4 MG/2ML IJ SOLN
4.0000 mg | Freq: Once | INTRAMUSCULAR | Status: AC
  Administered 2022-01-15: 4 mg via INTRAVENOUS
  Filled 2022-01-15: qty 2

## 2022-01-15 NOTE — ED Triage Notes (Signed)
Abd pain lower x 2 weeks has seen a dr and urologist yesterday urine came back clean she states but still in pain   having back pain as well

## 2022-01-15 NOTE — ED Provider Notes (Signed)
[facility] EMERGENCY DEPT Provider Note   CSN: [id number] Arrival date & time: 01/15/22  1458     History  Chief Complaint  Patient presents with   Abdominal Pain    Cheryl [name] is a 46 y.o. female presenting to emergency department with cramping lower abdominal pain, x 2 weeks.  She states that her PCP did a urinalysis and urine culture were negative, she went to see a urologist for referral 3 days ago at another urinalysis which had bacteria but no other sign of infection.  They have decided not to start her on antibiotics  + Nausea, she denies diarrhea but is not having regular bowel movements.  She does have a history of diverticulosis.  She denies fevers.  She denies dysuria or hematuria.  She does report some scant vaginal discharge.  She has not been sexually active in 2 years.  She reports a history of a hysterectomy, she is not sure if it was a partial or complete hysterectomy.  She also has a history of a cholecystectomy  HPI     Home Medications Prior to Admission medications   Medication Sig Start Date End Date Taking? Authorizing Provider  anastrozole (ARIMIDEX) 1 MG tablet TAKE 1 TABLET BY MOUTH EVERY DAY 04/09/21  Yes Magrinat, Virgie Dad, MD  b complex vitamins tablet Take 1 tablet by mouth daily.   Yes [provider]  cephALEXin (KEFLEX) 500 MG capsule Take 1 capsule (500 mg total) by mouth 2 (two) times daily for 5 days. 01/16/22 01/21/22 Yes Caylin Raby, Carola Rhine, MD  Vitamin D, Ergocalciferol, (DRISDOL) 1.25 MG (50000 UNIT) CAPS capsule TAKE 1 CAPSULE (50,000 UNITS TOTAL) BY MOUTH EVERY 7 (SEVEN) DAYS. 07/23/20  Yes Abonza, Maritza, PA-C  ondansetron (ZOFRAN ODT) 4 MG disintegrating tablet Take 1 tablet (4 mg total) by mouth every 8 (eight) hours as needed for nausea or vomiting. Patient not taking: Reported on 01/15/2022 09/02/21   Corena Herter, PA-C      Allergies    Adhesive [tape]    Review of Systems   Review of  Systems  Physical Exam Updated Vital Signs BP 117/70 (BP Location: Right Arm)    Pulse 89    Temp 98.2 F (36.8 C) (Oral)    Resp 19    Ht 5\' 2"  (1.575 m)    Wt 99.7 kg    LMP 04/02/2015 (Approximate) Comment: after chemo - no periods   SpO2 98%    BMI 40.20 kg/m  Physical Exam Constitutional:      General: [name] not in acute distress.    Appearance: [name] obese.  HENT:     Head: Normocephalic and atraumatic.  Eyes:     Conjunctiva/sclera: Conjunctivae normal.     Pupils: Pupils are equal, round, and reactive to light.  Cardiovascular:     Rate and Rhythm: Normal rate and regular rhythm.  Pulmonary:     Effort: Pulmonary effort is normal. No respiratory distress.  Abdominal:     General: There is no distension.     Tenderness: There is abdominal tenderness in the suprapubic area. There is no guarding. Negative signs include McBurney's sign.  Skin:    General: Skin is warm and dry.  Neurological:     General: No focal deficit present.     Mental Status: [name] alert. Mental status is at baseline.  Psychiatric:        Mood and Affect: Mood normal.  Behavior: Behavior normal.    ED Results / Procedures / Treatments   Labs (all labs ordered are listed, but only abnormal results are displayed) Labs Reviewed  COMPREHENSIVE METABOLIC PANEL - Abnormal; Notable for the following components:      Result Value   Glucose, Bld 124 (*)    All other components within normal limits  URINALYSIS, ROUTINE W REFLEX MICROSCOPIC - Abnormal; Notable for the following components:   Color, Urine ORANGE (*)    Nitrite POSITIVE (*)    Bacteria, UA RARE (*)    All other components within normal limits  WET PREP, GENITAL  URINE CULTURE  LIPASE, BLOOD  CBC WITH DIFFERENTIAL/PLATELET    EKG None  Radiology No results found.  Procedures Procedures    Medications Ordered in ED Medications  ketorolac (TORADOL) 30 MG/ML injection 30 mg (30 mg Intravenous Given 01/15/22 1605)   ondansetron (ZOFRAN) injection 4 mg (4 mg Intravenous Given 01/15/22 1605)  cephALEXin (KEFLEX) capsule 500 mg (500 mg Oral Given 01/15/22 1847)    ED Course/ Medical Decision Making/ A&P Clinical Course as of 01/16/22 0037  Thu Jan 15, 2022  1611 Nitrite(!): POSITIVE [MT]    Clinical Course User Index [MT] Wyvonnia Dusky, MD                           Medical Decision Making Amount and/or Complexity of Data Reviewed Labs: ordered. Decision-making details documented in ED Course.  Risk Prescription drug management.   Suprapubic and lower abdominal pain.  Differential would include cystitis versus interstitial cystitis versus other UTI versus ureteral colic versus other  I personally ordered, interpreted and reviewed the patient's labs and imaging, which is notable for no leukocytosis, labs wnl.  IV Toradol and IV Zosyn ordered for pain and nausea  Urine culture from February 16 reviewed which is no growth today.  This culture was obtained following the onset of her symptoms  UA from yesterday reviewed with moderate bacteria, no white blood cell counts  However UA today + for nitrites - based on her clinical symptoms I am inclined to treat for UTI, will start on Keflex and send culture.  Doubt appendicitis, intraabdominal infection Doubt PID/STI with no sexual activity in 2 years        Final Clinical Impression(s) / ED Diagnoses Final diagnoses:  Cystitis  Acute cystitis without hematuria    Rx / DC Orders ED Discharge Orders          Ordered    cephALEXin (KEFLEX) 500 MG capsule  2 times daily        01/15/22 1822              Wyvonnia Dusky, MD 01/16/22 [phone]

## 2022-01-17 LAB — URINE CULTURE: Culture: NO GROWTH

## 2022-01-17 LAB — CULTURE, URINE COMPREHENSIVE

## 2022-01-19 ENCOUNTER — Telehealth: Payer: Self-pay

## 2022-01-19 ENCOUNTER — Other Ambulatory Visit: Payer: Self-pay | Admitting: Family Medicine

## 2022-01-19 DIAGNOSIS — Z2239 Carrier of other specified bacterial diseases: Secondary | ICD-10-CM

## 2022-01-19 MED ORDER — DOXYCYCLINE HYCLATE 100 MG PO CAPS: 100.0000 mg | ORAL_CAPSULE | Freq: Two times a day (BID) | ORAL | 0 refills | Status: DC

## 2022-01-19 NOTE — Telephone Encounter (Signed)
-----   Message from Billey Co, MD sent at 01/19/2022  7:58 AM EST ----- Her urine culture did grow out an atypical infection with Ureaplasma-would recommend stopping the Keflex prescribed by ER, and treating with doxycycline 100 mg twice daily x7 days  Nickolas Madrid, MD 01/19/2022

## 2022-01-19 NOTE — Telephone Encounter (Signed)
Called pt no answer. Left detailed message on pt's VM per DPR informing her of the information below. Advised pt to call back for questions or concerns. RX sent.

## 2022-01-20 LAB — MYCOPLASMA / UREAPLASMA CULTURE
Mycoplasma hominis Culture: NEGATIVE
Ureaplasma urealyticum: POSITIVE — AB

## 2022-01-21 ENCOUNTER — Ambulatory Visit: Payer: 59 | Admitting: Physician Assistant

## 2022-02-02 NOTE — Progress Notes (Unsigned)
Established Patient Office Visit  Subjective:  Patient ID: Cheryl [name], female    DOB: 07/08/1976  Age: 46 y.o. MRN: [id number]  CC: No chief complaint on file.   HPI Cheryl [name] presents for ***  Past Medical History:  Diagnosis Date   Allergy    pollen   Breast cancer ([place]) 2016 and 2018   left breast, surgery and chemo done 2018   Cellulitis 07/25/2018   left breast   Pre-diabetes    SVD (spontaneous vaginal delivery)    x 3   Wears contact lenses     Past Surgical History:  Procedure Laterality Date   ABDOMINAL HYSTERECTOMY     BILATERAL TOTAL MASTECTOMY WITH AXILLARY LYMPH NODE DISSECTION  04/20/2017   BREAST BIOPSY Left 02/18/2015   BREAST BIOPSY Right 02/18/2015   BREAST EXCISIONAL BIOPSY Left 04/05/2015   BREAST IMPLANT REMOVAL Left 09/15/2018   Procedure: REMOVAL BREAST IMPLANTS;  Surgeon: Wallace Going, DO;  Location: [facility];  Service: Plastics;  Laterality: Left;   BREAST LUMPECTOMY Left 03/27/2015   BREAST LUMPECTOMY WITH NEEDLE LOCALIZATION AND AXILLARY SENTINEL LYMPH NODE BX Left 03/27/2015   Procedure: LEFT BREAST LUMPECTOMY WITH BRACKETED NEEDLE LOCALIZATION AND LEFT  AXILLARY SENTINEL LYMPH NODE BX;  Surgeon: [name], MD;  Location: [facility];  Service: General;  Laterality: Left;   BREAST RECONSTRUCTION Left 03/06/2021   Procedure: revision of left breast flap;  Surgeon: Wallace Going, DO;  Location: [name];  Service: Plastics;  Laterality: Left;   BREAST RECONSTRUCTION Bilateral 09/18/2021   Procedure: Bilateral Nipple reconstruction with tissue rearrangement;  Surgeon: Wallace Going, DO;  Location: [facility];  Service: Plastics;  Laterality: Bilateral;   BREAST RECONSTRUCTION WITH PLACEMENT OF TISSUE EXPANDER AND FLEX HD (ACELLULAR HYDRATED DERMIS) Bilateral 04/20/2017   Procedure: BILATARAL BREAST RECONSTRUCTION WITH PLACEMENT OF TISSUE EXPANDER  AND FLEX HD (ACELLULAR HYDRATED DERMIS);  Surgeon: Wallace Going, DO;  Location: [facility];  Service: Plastics;  Laterality: Bilateral;   BREAST REDUCTION SURGERY Right 12/28/2018   Procedure: Liposuction of medial right breast for improved symmetry;  Surgeon: Wallace Going, DO;  Location: [facility];  Service: Plastics;  Laterality: Right;   BREAST SURGERY Bilateral    masectomy   CHOLECYSTECTOMY N/A 07/07/2017   Procedure: LAPAROSCOPIC CHOLECYSTECTOMY WITH INTRAOPERATIVE CHOLANGIOGRAM;  Surgeon: [name], MD;  Location: [facility];  Service: General;  Laterality: N/A;   Insertion of Essure  yrs ago   For Birth control   [place] Bilateral 09/08/2018   Procedure: LAPAROSCOPIC ASSISTED VAGINAL HYSTERECTOMY WITH SALPINGO OOPHORECTOMY;  Surgeon: [name], MD;  Location: [facility];  Service: Gynecology;  Laterality: Bilateral;   LATISSIMUS FLAP TO BREAST Left 09/15/2018   Procedure: LATISSIMUS FLAP TO LEFT BREAST;  Surgeon: Wallace Going, DO;  Location: [facility];  Service: Plastics;  Laterality: Left;   LIPOSUCTION WITH LIPOFILLING Bilateral 05/20/2018   Procedure: Bilateral breast fat grafting from abdomen to improve symmetry following breast reconstruction;  Surgeon: Wallace Going, DO;  Location: [facility];  Service: Plastics;  Laterality: Bilateral;   LIPOSUCTION WITH LIPOFILLING Bilateral 03/06/2021   Procedure: Lipo filling and liposuction for improved symmetry;  Surgeon: Wallace Going, DO;  Location: [facility];  Service: Plastics;  Laterality: Bilateral;   MASTECTOMY W/ SENTINEL NODE BIOPSY Bilateral 04/20/2017   Procedure: BILATERAL TOTAL MASTECTOMY WITH LEFT AXILLARY SENTINEL LYMPH NODE  BIOPSY;  Surgeon: [name], MD;  Location: [facility];  Service: General;  Laterality: Bilateral;   PORTA CATH REMOVAL Right 11/04/2017   Procedure:  PORTA CATH REMOVAL;  Surgeon: Wallace Going, DO;  Location: [facility];  Service: Plastics;  Laterality: Right;   PORTACATH PLACEMENT Right 04/25/2015   Procedure: INSERTION PORT-A-CATH;  Surgeon: [name], MD;  Location: [facility];  Service: General;  Laterality: Right;, renoved in 2016   PORTACATH PLACEMENT Right 04/20/2017   Procedure: INSERTION PORT-A-CATH;  Surgeon: [name], MD;  Location: [facility];  Service: General;  Laterality: Right;   RE-EXCISION OF BREAST LUMPECTOMY Left 04/05/2015   Procedure: RE-EXCISION LEFT  BREAST LUMPECTOMY;  Surgeon: [name], MD;  Location: [facility];  Service: General;  Laterality: Left;   REMOVAL OF TISSUE EXPANDER AND PLACEMENT OF IMPLANT Bilateral 11/04/2017   Procedure: BILATERAL REMOVAL OF TISSUE EXPANDER AND PLACEMENT OF  BILATERAL BREAST IMPLANT;  Surgeon: Wallace Going, DO;  Location: [facility];  Service: Plastics;  Laterality: Bilateral;   REMOVAL OF TISSUE EXPANDER AND PLACEMENT OF IMPLANT Left 12/28/2018   Procedure: Removal of left breast expander and placement of silicone implant.;  Surgeon: Wallace Going, DO;  Location: [facility];  Service: Plastics;  Laterality: Left;   TISSUE EXPANDER PLACEMENT Left 09/15/2018   Procedure: PLACEMENT OF TISSUE EXPANDER;  Surgeon: Wallace Going, DO;  Location: [facility];  Service: Plastics;  Laterality: Left;    Family History  Problem Relation Age of Onset   Esophageal cancer Paternal Uncle    Lung cancer Maternal Grandmother 3       non smoker   Lung cancer Maternal Grandfather 75       non smoker worked in [place] Paternal Grandfather 35       throat cancer ? smoker   Colon cancer Neg Hx    Colon polyps Neg Hx    Rectal cancer Neg Hx    Stomach cancer Neg Hx     Social History   Socioeconomic History   Marital status: Divorced    Spouse name: Not on file    Number of children: 3   Years of education: Not on file   Highest education level: Not on file  Occupational History   Not on file  Tobacco Use   Smoking status: Never    Passive exposure: Past (dad smoked as a child)   Smokeless tobacco: Never  Vaping Use   Vaping Use: Never used  Substance and Sexual Activity   Alcohol use: Yes    Alcohol/week: 0.0 standard drinks    Comment: socially twice month   Drug use: No   Sexual activity: Yes    Birth control/protection: Surgical    Comment: Essure   Other Topics Concern   Not on file  Social History Narrative   Not on file   Social Determinants of Health   Financial Resource Strain: Not on file  Food Insecurity: Not on file  Transportation Needs: Not on file  Physical Activity: Not on file  Stress: Not on file  Social Connections: Not on file  Intimate Partner Violence: Not on file    Outpatient Medications Prior to Visit  Medication Sig Dispense Refill   anastrozole (ARIMIDEX) 1 MG tablet TAKE 1 TABLET BY MOUTH EVERY DAY 90 tablet 4   b complex vitamins tablet Take 1 tablet by mouth daily.     doxycycline (VIBRAMYCIN)  100 MG capsule Take 1 capsule (100 mg total) by mouth 2 (two) times daily. 14 capsule 0   ondansetron (ZOFRAN ODT) 4 MG disintegrating tablet Take 1 tablet (4 mg total) by mouth every 8 (eight) hours as needed for nausea or vomiting. (Patient not taking: Reported on 01/15/2022) 20 tablet 0   Vitamin D, Ergocalciferol, (DRISDOL) 1.25 MG (50000 UNIT) CAPS capsule TAKE 1 CAPSULE (50,000 UNITS TOTAL) BY MOUTH EVERY 7 (SEVEN) DAYS. 12 capsule 4   No facility-administered medications prior to visit.    Allergies  Allergen Reactions   Adhesive [Tape] Other (See Comments)    Band Aids causes Skin blisters.  All other tape is okay to use    ROS Review of Systems    Objective:    Physical Exam  LMP 04/02/2015 (Approximate) Comment: after chemo - no periods Wt Readings from Last 3 Encounters:  01/15/22 219  lb 12.8 oz (99.7 kg)  01/14/22 220 lb (99.8 kg)  10/23/21 230 lb 9.6 oz (104.6 kg)     Health Maintenance Due  Topic Date Due   COVID-19 Vaccine (1) Never done   Hepatitis C Screening  Never done   TETANUS/TDAP  Never done   PAP SMEAR-Modifier  02/26/2021   INFLUENZA VACCINE  Never done    There are no preventive care reminders to display for this patient.  Lab Results  Component Value Date   TSH 1.380 07/19/2020   Lab Results  Component Value Date   WBC 8.5 01/15/2022   HGB 12.0 01/15/2022   HCT 37.0 01/15/2022   MCV 89.4 01/15/2022   PLT 280 01/15/2022   Lab Results  Component Value Date   NA 140 01/15/2022   K 3.7 01/15/2022   CHLORIDE 105 10/21/2017   CO2 26 01/15/2022   GLUCOSE 124 (H) 01/15/2022   BUN 17 01/15/2022   CREATININE 0.86 01/15/2022   BILITOT 0.8 01/15/2022   ALKPHOS 84 01/15/2022   AST 34 01/15/2022   ALT 43 01/15/2022   PROT 6.6 01/15/2022   ALBUMIN 4.2 01/15/2022   CALCIUM 9.6 01/15/2022   ANIONGAP 11 01/15/2022   EGFR >60 10/21/2017   Lab Results  Component Value Date   CHOL 253 (H) 07/19/2020   Lab Results  Component Value Date   HDL 54 07/19/2020   Lab Results  Component Value Date   LDLCALC 177 (H) 07/19/2020   Lab Results  Component Value Date   TRIG 124 07/19/2020   Lab Results  Component Value Date   CHOLHDL 4.7 (H) 07/19/2020   Lab Results  Component Value Date   HGBA1C 6.0 (H) 07/19/2020      Assessment & Plan:   Problem List Items Addressed This Visit   None   No orders of the defined types were placed in this encounter.   Follow-up: No follow-ups on file.    Aron Baba, [place]

## 2022-02-03 ENCOUNTER — Encounter: Payer: Self-pay | Admitting: Physician Assistant

## 2022-02-03 ENCOUNTER — Other Ambulatory Visit: Payer: Self-pay

## 2022-02-03 ENCOUNTER — Ambulatory Visit: Payer: 59 | Admitting: Physician Assistant

## 2022-02-03 VITALS — BP 128/80 | HR 82 | Temp 98.0°F | Ht 62.0 in | Wt 229.0 lb

## 2022-02-03 DIAGNOSIS — Z0000 Encounter for general adult medical examination without abnormal findings: Secondary | ICD-10-CM | POA: Diagnosis not present

## 2022-02-03 DIAGNOSIS — Z1321 Encounter for screening for nutritional disorder: Secondary | ICD-10-CM

## 2022-02-03 DIAGNOSIS — E559 Vitamin D deficiency, unspecified: Secondary | ICD-10-CM

## 2022-02-03 DIAGNOSIS — R81 Glycosuria: Secondary | ICD-10-CM

## 2022-02-03 DIAGNOSIS — E781 Pure hyperglyceridemia: Secondary | ICD-10-CM

## 2022-02-03 DIAGNOSIS — Z13228 Encounter for screening for other metabolic disorders: Secondary | ICD-10-CM

## 2022-02-03 DIAGNOSIS — Z130 Encounter for screening for diseases of the blood and blood-forming organs and certain disorders involving the immune mechanism: Secondary | ICD-10-CM

## 2022-02-03 DIAGNOSIS — Z1329 Encounter for screening for other suspected endocrine disorder: Secondary | ICD-10-CM | POA: Diagnosis not present

## 2022-02-03 DIAGNOSIS — R5383 Other fatigue: Secondary | ICD-10-CM

## 2022-02-03 DIAGNOSIS — R7302 Impaired glucose tolerance (oral): Secondary | ICD-10-CM

## 2022-02-03 MED ORDER — WEGOVY 0.25 MG/0.5ML ~~LOC~~ SOAJ: 0.2500 mg | SUBCUTANEOUS | 0 refills | Status: DC

## 2022-02-03 NOTE — Patient Instructions (Signed)
Calorie Counting for Weight Loss ?Calories are units of energy. Your body needs a certain number of calories from food to keep going throughout the day. When you eat or drink more calories than your body needs, your body stores the extra calories mostly as fat. When you eat or drink fewer calories than your body needs, your body burns fat to get the energy it needs. ?Calorie counting means keeping track of how many calories you eat and drink each day. Calorie counting can be helpful if you need to lose weight. If you eat fewer calories than your body needs, you should lose weight. Ask your health care provider what a healthy weight is for you. ?For calorie counting to work, you will need to eat the right number of calories each day to lose a healthy amount of weight per week. A dietitian can help you figure out how many calories you need in a day and will suggest ways to reach your calorie goal. ?A healthy amount of weight to lose each week is usually 1-2 lb (0.5-0.9 kg). This usually means that your daily calorie intake should be reduced by 500-750 calories. ?Eating 1,200-1,500 calories a day can help most women lose weight. ?Eating 1,500-1,800 calories a day can help most men lose weight. ?What do I need to know about calorie counting? ?Work with your health care provider or dietitian to determine how many calories you should get each day. To meet your daily calorie goal, you will need to: ?Find out how many calories are in each food that you would like to eat. Try to do this before you eat. ?Decide how much of the food you plan to eat. ?Keep a food log. Do this by writing down what you ate and how many calories it had. ?To successfully lose weight, it is important to balance calorie counting with a healthy lifestyle that includes regular activity. ?Where do I find calorie information? ?The number of calories in a food can be found on a Nutrition Facts label. If a food does not have a Nutrition Facts label, try to  look up the calories online or ask your dietitian for help. ?Remember that calories are listed per serving. If you choose to have more than one serving of a food, you will have to multiply the calories per serving by the number of servings you plan to eat. For example, the label on a package of bread might say that a serving size is 1 slice and that there are 90 calories in a serving. If you eat 1 slice, you will have eaten 90 calories. If you eat 2 slices, you will have eaten 180 calories. ?How do I keep a food log? ?After each time that you eat, record the following in your food log as soon as possible: ?What you ate. Be sure to include toppings, sauces, and other extras on the food. ?How much you ate. This can be measured in cups, ounces, or number of items. ?How many calories were in each food and drink. ?The total number of calories in the food you ate. ?Keep your food log near you, such as in a pocket-sized notebook or on an app or website on your mobile phone. Some programs will calculate calories for you and show you how many calories you have left to meet your daily goal. ?What are some portion-control tips? ?Know how many calories are in a serving. This will help you know how many servings you can have of a certain food. ?  Use a measuring cup to measure serving sizes. You could also try weighing out portions on a kitchen scale. With time, you will be able to estimate serving sizes for some foods. ?Take time to put servings of different foods on your favorite plates or in your favorite bowls and cups so you know what a serving looks like. ?Try not to eat straight from a food's packaging, such as from a bag or box. Eating straight from the package makes it hard to see how much you are eating and can lead to overeating. Put the amount you would like to eat in a cup or on a plate to make sure you are eating the right portion. ?Use smaller plates, glasses, and bowls for smaller portions and to prevent  overeating. ?Try not to multitask. For example, avoid watching TV or using your computer while eating. If it is time to eat, sit down at a table and enjoy your food. This will help you recognize when you are full. It will also help you be more mindful of what and how much you are eating. ?What are tips for following this plan? ?Reading food labels ?Check the calorie count compared with the serving size. The serving size may be smaller than what you are used to eating. ?Check the source of the calories. Try to choose foods that are high in protein, fiber, and vitamins, and low in saturated fat, trans fat, and sodium. ?Shopping ?Read nutrition labels while you shop. This will help you make healthy decisions about which foods to buy. ?Pay attention to nutrition labels for low-fat or fat-free foods. These foods sometimes have the same number of calories or more calories than the full-fat versions. They also often have added sugar, starch, or salt to make up for flavor that was removed with the fat. ?Make a grocery list of lower-calorie foods and stick to it. ?Cooking ?Try to cook your favorite foods in a healthier way. For example, try baking instead of frying. ?Use low-fat dairy products. ?Meal planning ?Use more fruits and vegetables. One-half of your plate should be fruits and vegetables. ?Include lean proteins, such as chicken, [place], and fish. ?Lifestyle ?Each week, aim to do one of the following: ?150 minutes of moderate exercise, such as walking. ?75 minutes of vigorous exercise, such as running. ?General information ?Know how many calories are in the foods you eat most often. This will help you calculate calorie counts faster. ?Find a way of tracking calories that works for you. Get creative. Try different apps or programs if writing down calories does not work for you. ?What foods should I eat? ? ?Eat nutritious foods. It is better to have a nutritious, high-calorie food, such as an avocado, than a food with  few nutrients, such as a bag of potato chips. ?Use your calories on foods and drinks that will fill you up and will not leave you hungry soon after eating. ?Examples of foods that fill you up are nuts and nut butters, vegetables, lean proteins, and high-fiber foods such as whole grains. High-fiber foods are foods with more than 5 g of fiber per serving. ?Pay attention to calories in drinks. Low-calorie drinks include water and unsweetened drinks. ?The items listed above may not be a complete list of foods and beverages you can eat. Contact a dietitian for more information. ?What foods should I limit? ?Limit foods or drinks that are not good sources of vitamins, minerals, or protein or that are high in unhealthy fats. These include: ?  Candy. ?Other sweets. ?Sodas, specialty coffee drinks, alcohol, and juice. ?The items listed above may not be a complete list of foods and beverages you should avoid. Contact a dietitian for more information. ?How do I count calories when eating out? ?Pay attention to portions. Often, portions are much larger when eating out. Try these tips to keep portions smaller: ?Consider sharing a meal instead of getting your own. ?If you get your own meal, eat only half of it. Before you start eating, ask for a container and put half of your meal into it. ?When available, consider ordering smaller portions from the menu instead of full portions. ?Pay attention to your food and drink choices. Knowing the way food is cooked and what is included with the meal can help you eat fewer calories. ?If calories are listed on the menu, choose the lower-calorie options. ?Choose dishes that include vegetables, fruits, whole grains, low-fat dairy products, and lean proteins. ?Choose items that are boiled, broiled, grilled, or steamed. Avoid items that are buttered, battered, fried, or served with cream sauce. Items labeled as crispy are usually fried, unless stated otherwise. ?Choose water, low-fat milk,  unsweetened iced tea, or other drinks without added sugar. If you want an alcoholic beverage, choose a lower-calorie option, such as a glass of wine or light beer. ?Ask for dressings, sauces, and syrups on the side. T

## 2022-02-04 ENCOUNTER — Other Ambulatory Visit: Payer: Self-pay | Admitting: Physician Assistant

## 2022-02-04 DIAGNOSIS — E1169 Type 2 diabetes mellitus with other specified complication: Secondary | ICD-10-CM

## 2022-02-04 LAB — CBC WITH DIFFERENTIAL/PLATELET
Basophils Absolute: 0.1 10*3/uL (ref 0.0–0.2)
Basos: 1 %
EOS (ABSOLUTE): 0.2 10*3/uL (ref 0.0–0.4)
Eos: 3 %
Hematocrit: 39 % (ref 34.0–46.6)
Hemoglobin: 13.4 g/dL (ref 11.1–15.9)
Immature Grans (Abs): 0 10*3/uL (ref 0.0–0.1)
Immature Granulocytes: 0 %
Lymphocytes Absolute: 2.3 10*3/uL (ref 0.7–3.1)
Lymphs: 37 %
MCH: 30.2 pg (ref 26.6–33.0)
MCHC: 34.4 g/dL (ref 31.5–35.7)
MCV: 88 fL (ref 79–97)
Monocytes Absolute: 0.4 10*3/uL (ref 0.1–0.9)
Monocytes: 7 %
Neutrophils Absolute: 3.3 10*3/uL (ref 1.4–7.0)
Neutrophils: 52 %
Platelets: 329 10*3/uL (ref 150–450)
RBC: 4.43 x10E6/uL (ref 3.77–5.28)
RDW: 12.8 % (ref 11.7–15.4)
WBC: 6.2 10*3/uL (ref 3.4–10.8)

## 2022-02-04 LAB — COMPREHENSIVE METABOLIC PANEL
ALT: 42 IU/L — ABNORMAL HIGH (ref 0–32)
AST: 33 IU/L (ref 0–40)
Albumin/Globulin Ratio: 1.7 (ref 1.2–2.2)
Albumin: 4.6 g/dL (ref 3.8–4.8)
Alkaline Phosphatase: 96 IU/L (ref 44–121)
BUN/Creatinine Ratio: 18 (ref 9–23)
BUN: 14 mg/dL (ref 6–24)
Bilirubin Total: 0.9 mg/dL (ref 0.0–1.2)
CO2: 20 mmol/L (ref 20–29)
Calcium: 10.1 mg/dL (ref 8.7–10.2)
Chloride: 101 mmol/L (ref 96–106)
Creatinine, Ser: 0.76 mg/dL (ref 0.57–1.00)
Globulin, Total: 2.7 g/dL (ref 1.5–4.5)
Glucose: 146 mg/dL — ABNORMAL HIGH (ref 70–99)
Potassium: 4 mmol/L (ref 3.5–5.2)
Sodium: 139 mmol/L (ref 134–144)
Total Protein: 7.3 g/dL (ref 6.0–8.5)
eGFR: 97 mL/min/{1.73_m2} (ref >59)

## 2022-02-04 LAB — HEMOGLOBIN A1C
Est. average glucose Bld gHb Est-mCnc: 174 mg/dL
Hgb A1c MFr Bld: 7.7 % — ABNORMAL HIGH (ref 4.8–5.6)

## 2022-02-04 LAB — LIPID PANEL
Chol/HDL Ratio: 6.7 ratio — ABNORMAL HIGH (ref 0.0–4.4)
Cholesterol, Total: 326 mg/dL — ABNORMAL HIGH (ref 100–199)
HDL: 49 mg/dL (ref >39)
LDL Chol Calc (NIH): 239 mg/dL — ABNORMAL HIGH (ref 0–99)
Triglycerides: 192 mg/dL — ABNORMAL HIGH (ref 0–149)
VLDL Cholesterol Cal: 38 mg/dL (ref 5–40)

## 2022-02-04 LAB — TSH: TSH: 2.86 u[IU]/mL (ref 0.450–4.500)

## 2022-02-04 LAB — VITAMIN D 25 HYDROXY (VIT D DEFICIENCY, FRACTURES): Vit D, 25-Hydroxy: 26.2 ng/mL — ABNORMAL LOW (ref 30.0–100.0)

## 2022-02-04 MED ORDER — ROSUVASTATIN CALCIUM 5 MG PO TABS: 5.0000 mg | ORAL_TABLET | Freq: Every day | ORAL | 1 refills | Status: DC

## 2022-02-04 MED ORDER — OZEMPIC (0.25 OR 0.5 MG/DOSE) 2 MG/1.5ML ~~LOC~~ SOPN: PEN_INJECTOR | SUBCUTANEOUS | 2 refills | Status: DC

## 2022-02-04 NOTE — Addendum Note (Signed)
Addended by: Mickel Crow on: 02/04/2022 01:58 PM ? ? Modules accepted: Orders ? ?

## 2022-02-06 ENCOUNTER — Inpatient Hospital Stay: Payer: 59 | Admitting: Hematology and Oncology

## 2022-02-06 ENCOUNTER — Inpatient Hospital Stay: Payer: 59

## 2022-02-09 ENCOUNTER — Encounter: Payer: Self-pay | Admitting: Physician Assistant

## 2022-02-17 ENCOUNTER — Telehealth: Payer: Self-pay | Admitting: Physician Assistant

## 2022-02-17 NOTE — Telephone Encounter (Signed)
Can you check on the PA for her Ozempic. She called and left a message asking about it. [phone] ?

## 2022-02-18 NOTE — Telephone Encounter (Signed)
Called and left voicemail letting patient know that PA was denied for the Ozempic. Told her to callback if she wanted to try another medication.  ?

## 2022-02-19 ENCOUNTER — Telehealth: Payer: Self-pay | Admitting: Physician Assistant

## 2022-02-19 ENCOUNTER — Other Ambulatory Visit: Payer: Self-pay

## 2022-02-19 DIAGNOSIS — E1169 Type 2 diabetes mellitus with other specified complication: Secondary | ICD-10-CM

## 2022-02-19 MED ORDER — METFORMIN HCL 500 MG PO TABS
500.0000 mg | ORAL_TABLET | Freq: Two times a day (BID) | ORAL | 2 refills | Status: DC
Start: 2022-02-19 — End: 2022-06-05

## 2022-02-19 NOTE — Telephone Encounter (Signed)
Patient called and said since she cannot get the ozempic will you prescribe her the phentermine?  ?

## 2022-02-19 NOTE — Telephone Encounter (Signed)
Called patient to give reason why Ozempic was denied and the new rx to treat diabetes. Left a voicemail with instructions from PCP.  ?

## 2022-02-20 ENCOUNTER — Other Ambulatory Visit: Payer: Self-pay

## 2022-02-20 MED ORDER — WEGOVY 0.25 MG/0.5ML ~~LOC~~ SOAJ: 0.2500 mg | SUBCUTANEOUS | 0 refills | Status: DC

## 2022-03-17 ENCOUNTER — Other Ambulatory Visit: Payer: Self-pay

## 2022-03-17 DIAGNOSIS — E1169 Type 2 diabetes mellitus with other specified complication: Secondary | ICD-10-CM

## 2022-03-18 ENCOUNTER — Other Ambulatory Visit: Payer: 59

## 2022-03-31 ENCOUNTER — Telehealth: Payer: Self-pay | Admitting: Hematology and Oncology

## 2022-03-31 NOTE — Telephone Encounter (Signed)
.  Called patient to schedule appointment per 5.9 inbasket, patient is aware of date and time.   ?

## 2022-05-21 ENCOUNTER — Telehealth: Payer: Self-pay | Admitting: Hematology and Oncology

## 2022-05-21 NOTE — Telephone Encounter (Signed)
.  Called patient to schedule appointment per 6/29 INBASKET, patient is aware of date and time.

## 2022-05-22 ENCOUNTER — Inpatient Hospital Stay: Payer: 59

## 2022-05-22 ENCOUNTER — Inpatient Hospital Stay: Payer: 59 | Admitting: Hematology and Oncology

## 2022-06-05 ENCOUNTER — Other Ambulatory Visit: Payer: Self-pay | Admitting: Family

## 2022-06-05 ENCOUNTER — Encounter: Payer: Self-pay | Admitting: Oncology

## 2022-06-05 ENCOUNTER — Encounter: Payer: Self-pay | Admitting: Family

## 2022-06-05 ENCOUNTER — Ambulatory Visit: Payer: 59 | Admitting: Family

## 2022-06-05 VITALS — BP 122/82 | HR 88 | Temp 98.7°F | Resp 16 | Ht 62.0 in | Wt 212.2 lb

## 2022-06-05 DIAGNOSIS — K439 Ventral hernia without obstruction or gangrene: Secondary | ICD-10-CM

## 2022-06-05 DIAGNOSIS — E559 Vitamin D deficiency, unspecified: Secondary | ICD-10-CM

## 2022-06-05 DIAGNOSIS — E538 Deficiency of other specified B group vitamins: Secondary | ICD-10-CM

## 2022-06-05 DIAGNOSIS — E119 Type 2 diabetes mellitus without complications: Secondary | ICD-10-CM | POA: Diagnosis not present

## 2022-06-05 DIAGNOSIS — E782 Mixed hyperlipidemia: Secondary | ICD-10-CM

## 2022-06-05 DIAGNOSIS — K7689 Other specified diseases of liver: Secondary | ICD-10-CM

## 2022-06-05 DIAGNOSIS — E1169 Type 2 diabetes mellitus with other specified complication: Secondary | ICD-10-CM

## 2022-06-05 DIAGNOSIS — E785 Hyperlipidemia, unspecified: Secondary | ICD-10-CM

## 2022-06-05 DIAGNOSIS — R7989 Other specified abnormal findings of blood chemistry: Secondary | ICD-10-CM

## 2022-06-05 DIAGNOSIS — I700 Atherosclerosis of aorta: Secondary | ICD-10-CM | POA: Insufficient documentation

## 2022-06-05 LAB — CBC WITH DIFFERENTIAL/PLATELET
Basophils Absolute: 0 10*3/uL (ref 0.0–0.1)
Basophils Relative: 0.6 % (ref 0.0–3.0)
Eosinophils Absolute: 0.1 10*3/uL (ref 0.0–0.7)
Eosinophils Relative: 2 % (ref 0.0–5.0)
HCT: 40.1 % (ref 36.0–46.0)
Hemoglobin: 13.2 g/dL (ref 12.0–15.0)
Lymphocytes Relative: 31.2 % (ref 12.0–46.0)
Lymphs Abs: 1.9 10*3/uL (ref 0.7–4.0)
MCHC: 33 g/dL (ref 30.0–36.0)
MCV: 89.5 fl (ref 78.0–100.0)
Monocytes Absolute: 0.3 10*3/uL (ref 0.1–1.0)
Monocytes Relative: 5.4 % (ref 3.0–12.0)
Neutro Abs: 3.7 10*3/uL (ref 1.4–7.7)
Neutrophils Relative %: 60.8 % (ref 43.0–77.0)
Platelets: 315 10*3/uL (ref 150.0–400.0)
RBC: 4.48 Mil/uL (ref 3.87–5.11)
RDW: 13.6 % (ref 11.5–15.5)
WBC: 6.1 10*3/uL (ref 4.0–10.5)

## 2022-06-05 LAB — COMPREHENSIVE METABOLIC PANEL
ALT: 23 U/L (ref 0–35)
AST: 20 U/L (ref 0–37)
Albumin: 4.5 g/dL (ref 3.5–5.2)
Alkaline Phosphatase: 69 U/L (ref 39–117)
BUN: 8 mg/dL (ref 6–23)
CO2: 28 mEq/L (ref 19–32)
Calcium: 9.4 mg/dL (ref 8.4–10.5)
Chloride: 102 mEq/L (ref 96–112)
Creatinine, Ser: 0.74 mg/dL (ref 0.40–1.20)
GFR: 96.36 mL/min (ref >60.00)
Glucose, Bld: 124 mg/dL — ABNORMAL HIGH (ref 70–99)
Potassium: 4.2 mEq/L (ref 3.5–5.1)
Sodium: 138 mEq/L (ref 135–145)
Total Bilirubin: 0.8 mg/dL (ref 0.2–1.2)
Total Protein: 7.4 g/dL (ref 6.0–8.3)

## 2022-06-05 LAB — VITAMIN B12: Vitamin B-12: 344 pg/mL (ref 211–911)

## 2022-06-05 LAB — LIPID PANEL
Cholesterol: 271 mg/dL — ABNORMAL HIGH (ref 0–200)
HDL: 49.1 mg/dL (ref >39.00)
LDL Cholesterol: 185 mg/dL — ABNORMAL HIGH (ref 0–99)
NonHDL: 221.56
Total CHOL/HDL Ratio: 6
Triglycerides: 184 mg/dL — ABNORMAL HIGH (ref 0.0–149.0)
VLDL: 36.8 mg/dL (ref 0.0–40.0)

## 2022-06-05 LAB — HEMOGLOBIN A1C: Hgb A1c MFr Bld: 6.9 % — ABNORMAL HIGH (ref 4.6–6.5)

## 2022-06-05 LAB — MICROALBUMIN / CREATININE URINE RATIO
Creatinine,U: 79.4 mg/dL
Microalb Creat Ratio: 0.9 mg/g (ref 0.0–30.0)
Microalb, Ur: <0.7 mg/dL (ref 0.0–1.9)

## 2022-06-05 LAB — VITAMIN D 25 HYDROXY (VIT D DEFICIENCY, FRACTURES): VITD: 29.7 ng/mL — ABNORMAL LOW (ref 30.00–100.00)

## 2022-06-05 MED ORDER — ROSUVASTATIN CALCIUM 5 MG PO TABS: 5.0000 mg | ORAL_TABLET | Freq: Every day | ORAL | 1 refills | Status: DC

## 2022-06-05 MED ORDER — METFORMIN HCL 500 MG PO TABS: 500.0000 mg | ORAL_TABLET | Freq: Two times a day (BID) | ORAL | 2 refills | Status: DC

## 2022-06-05 MED ORDER — VITAMIN D (ERGOCALCIFEROL) 1.25 MG (50000 UNIT) PO CAPS: 50000.0000 [IU] | ORAL_CAPSULE | ORAL | 0 refills | Status: AC

## 2022-06-05 MED ORDER — OZEMPIC (0.25 OR 0.5 MG/DOSE) 2 MG/1.5ML ~~LOC~~ SOPN: PEN_INJECTOR | SUBCUTANEOUS | 0 refills | Status: DC

## 2022-06-05 NOTE — Assessment & Plan Note (Signed)
Did advise pt to start crestor as with very elevated cholesterol.  Lipid panel ordered pending results.

## 2022-06-05 NOTE — Assessment & Plan Note (Signed)
Ordering b12  Recommend daily 1000 mcg once daily otc b12

## 2022-06-05 NOTE — Assessment & Plan Note (Signed)
Work on [employer] and exercise as tolerated

## 2022-06-05 NOTE — Patient Instructions (Signed)
A referral was placed today for opthalmology. Please let us know if you have not heard back within 2 weeks about the referral.  Welcome to our clinic, I am happy to have you as my new patient. I am excited to continue on this healthcare journey with you.  Stop by the lab prior to leaving today. I will notify you of your results once received.   Please keep in mind Any my chart messages you send have up to a three business day turnaround for a response.  Phone calls may take up to a one full business day turnaround for a  response.   If you need a medication refill I recommend you request it through the pharmacy as this is easiest for [place] rather than sending a message and or phone call.   Due to recent changes in healthcare laws, you may see results of your imaging and/or laboratory studies on MyChart before I have had a chance to review them.  I understand that in some cases there may be results that are confusing or concerning to you. Please understand that not all results are received at the same time and often I may need to interpret multiple results in order to provide you with the best plan of care or course of treatment. Therefore, I ask that you please give me 2 business days to thoroughly review all your results before contacting my office for clarification. Should we see a critical lab result, you will be contacted sooner.   It was a pleasure seeing you today! Please do not hesitate to reach out with any questions and or concerns.  Regards,   Eugenia Pancoast FNP-C

## 2022-06-05 NOTE — Progress Notes (Signed)
New Patient Office Visit  Subjective:  Patient ID: Cheryl [name], female    DOB: 1976-08-01  Age: 46 y.o. MRN: [id number]  CC:  Chief Complaint  Patient presents with   Establish Care    HPI Cheryl [name] is here to establish care as a new patient.  Prior provider was: Lorrene Reid, PA-C.  Pt is without acute concerns.   H/o breast cancer x 2. Has completed chemotherapy, surgery and radiation.  Rescheduled for f/u with oncologist for five year f/u appt   Osteopenia on record however last bone density normal bone density.   Vitamin d def: not currently taking daily vitamin D.   Elevated liver function tests: reviewed last Ct abd pelvis on file 2019, no focal lesions on lliver visualized on noncontrast study. With aortic atherosclerosis  DM2: was given metformin but never started. She didn't know if she really should be taking it or not. No recent diabetic eye exam, not sure if ever. Does have optometrist not opthamologist. Checks her feet daily.  Lab Results  Component Value Date   HGBA1C 7.7 (H) 02/03/2022   HLD: was given crestor however decided not to start was hesitant.  Lab Results  Component Value Date   CHOL 326 (H) 02/03/2022   HDL 49 02/03/2022   LDLCALC 239 (H) 02/03/2022   TRIG 192 (H) 02/03/2022   CHOLHDL 6.7 (H) 02/03/2022    Past Medical History:  Diagnosis Date   Allergy    pollen   Breast cancer ([place]) 2016 and 2018   left breast, surgery and chemo done 2018   Cellulitis 07/25/2018   left breast   Chemotherapy-induced neuropathy ([place]) 07/11/2015   Malignant neoplasm of upper-outer quadrant of left breast in female, estrogen receptor negative ([place]) 02/13/2015   Osteopenia 12/25/2019   Pre-diabetes    Recurrent breast cancer, left ([place]) 03/18/2017   S/P breast reconstruction 09/13/2018   S/P laparoscopic assisted vaginal hysterectomy ([facility]) 09/08/2018   S/P mastectomy, bilateral 10/07/2018   SVD (spontaneous vaginal  delivery)    x 3   Wears contact lenses     Past Surgical History:  Procedure Laterality Date   ABDOMINAL HYSTERECTOMY     BILATERAL TOTAL MASTECTOMY WITH AXILLARY LYMPH NODE DISSECTION  04/20/2017   BREAST BIOPSY Left 02/18/2015   BREAST BIOPSY Right 02/18/2015   BREAST EXCISIONAL BIOPSY Left 04/05/2015   BREAST IMPLANT REMOVAL Left 09/15/2018   Procedure: REMOVAL BREAST IMPLANTS;  Surgeon: Wallace Going, DO;  Location: [facility];  Service: Plastics;  Laterality: Left;   BREAST LUMPECTOMY Left 03/27/2015   BREAST LUMPECTOMY WITH NEEDLE LOCALIZATION AND AXILLARY SENTINEL LYMPH NODE BX Left 03/27/2015   Procedure: LEFT BREAST LUMPECTOMY WITH BRACKETED NEEDLE LOCALIZATION AND LEFT  AXILLARY SENTINEL LYMPH NODE BX;  Surgeon: [name], MD;  Location: [facility];  Service: General;  Laterality: Left;   BREAST RECONSTRUCTION Left 03/06/2021   Procedure: revision of left breast flap;  Surgeon: Wallace Going, DO;  Location: [facility];  Service: Plastics;  Laterality: Left;   BREAST RECONSTRUCTION Bilateral 09/18/2021   Procedure: Bilateral Nipple reconstruction with tissue rearrangement;  Surgeon: Wallace Going, DO;  Location: [facility];  Service: Plastics;  Laterality: Bilateral;   BREAST RECONSTRUCTION WITH PLACEMENT OF TISSUE EXPANDER AND FLEX HD (ACELLULAR HYDRATED DERMIS) Bilateral 04/20/2017   Procedure: BILATARAL BREAST RECONSTRUCTION WITH PLACEMENT OF TISSUE EXPANDER AND FLEX HD (ACELLULAR HYDRATED DERMIS);  Surgeon: Wallace Going, DO;  Location: [facility];  Service: Plastics;  Laterality: Bilateral;   BREAST REDUCTION SURGERY Right 12/28/2018   Procedure: Liposuction of medial right breast for improved symmetry;  Surgeon: Dillingham, Claire S, DO;  Location: [facility];  Service: Plastics;  Laterality: Right;   BREAST SURGERY Bilateral    masectomy   CHOLECYSTECTOMY N/A 07/07/2017   Procedure:  LAPAROSCOPIC CHOLECYSTECTOMY WITH INTRAOPERATIVE CHOLANGIOGRAM;  Surgeon: Hoxworth, Benjamin, MD;  Location: [facility];  Service: General;  Laterality: N/A;   Insertion of Essure  yrs ago   For Birth control   LAPAROSCOPIC VAGINAL HYSTERECTOMY WITH SALPINGO OOPHORECTOMY Bilateral 09/08/2018   Procedure: LAPAROSCOPIC ASSISTED VAGINAL HYSTERECTOMY WITH SALPINGO OOPHORECTOMY;  Surgeon: Richardson, Kathy, MD;  Location: [facility];  Service: Gynecology;  Laterality: Bilateral;   LATISSIMUS FLAP TO BREAST Left 09/15/2018   Procedure: LATISSIMUS FLAP TO LEFT BREAST;  Surgeon: Dillingham, Claire S, DO;  Location: [facility];  Service: Plastics;  Laterality: Left;   LIPOSUCTION WITH LIPOFILLING Bilateral 05/20/2018   Procedure: Bilateral breast fat grafting from abdomen to improve symmetry following breast reconstruction;  Surgeon: Dillingham, Claire S, DO;  Location: [facility];  Service: Plastics;  Laterality: Bilateral;   LIPOSUCTION WITH LIPOFILLING Bilateral 03/06/2021   Procedure: Lipo filling and liposuction for improved symmetry;  Surgeon: Dillingham, Claire S, DO;  Location: [facility];  Service: Plastics;  Laterality: Bilateral;   MASTECTOMY W/ SENTINEL NODE BIOPSY Bilateral 04/20/2017   Procedure: BILATERAL TOTAL MASTECTOMY WITH LEFT AXILLARY SENTINEL LYMPH NODE BIOPSY;  Surgeon: Hoxworth, Benjamin, MD;  Location[facility];  Service: General;  Laterality: Bilateral;   PORTA CATH REMOVAL Right 11/04/2017   Procedure: PORTA CATH REMOVAL;  Surgeon: Dillingham, Claire S, DO;  Location: [facility];  Service: Plastics;  Laterality: Right;   PORTACATH PLACEMENT Right 04/25/2015   Procedure: INSERTION PORT-A-CATH;  Surgeon: Benjamin Hoxworth, MD;  Location: [facility];  Service: General;  Laterality: Right;, renoved in 2016   PORTACATH PLACEMENT Right 04/20/2017   Procedure: INSERTION PORT-A-CATH;  Surgeon: Hoxworth, Benjamin, MD;  [facility];  Service: General;  Laterality: Right;   RE-EXCISION OF BREAST LUMPECTOMY Left 04/05/2015   Procedure: RE-EXCISION LEFT  BREAST LUMPECTOMY;  Surgeon: Benjamin Hoxworth, MD;  Location: [facility];  Service: General;  Laterality: Left;   REMOVAL OF TISSUE EXPANDER AND PLACEMENT OF IMPLANT Bilateral 11/04/2017   Procedure: BILATERAL REMOVAL OF TISSUE EXPANDER AND PLACEMENT OF  BILATERAL BREAST IMPLANT;  Surgeon: Dillingham, Claire S, DO;  Location: [facility];  Service: Plastics;  Laterality: Bilateral;   REMOVAL OF TISSUE EXPANDER AND PLACEMENT OF IMPLANT Left 12/28/2018   Procedure: Removal of left breast expander and placement of silicone implant.;  Surgeon: Dillingham, Claire S, DO;  Location: [facility];  Service: Plastics;  Laterality: Left;   TISSUE EXPANDER PLACEMENT Left 09/15/2018   Procedure: PLACEMENT OF TISSUE EXPANDER;  Surgeon: Dillingham, Claire S, DO;  Location[facility];  Service: Plastics;  Laterality: Left;    Family History  Problem Relation Age of Onset   Diabetes Mother    Hypertension Mother    COPD Father    Lung cancer Maternal Grandmother 55   Lung cancer Maternal Grandfather 65       non smoker worked in coal mines   Diabetes Paternal Grandmother    Hypertension Paternal Grandmother    Throat cancer Paternal Grandfather 35   Esophageal cancer Paternal Uncle    Colon cancer Neg Hx      Colon polyps Neg Hx    Rectal cancer Neg Hx    Stomach cancer Neg Hx     Social History   Socioeconomic History   Marital status: Divorced    Spouse name: Not on file   Number of children: 3   Years of education: Not on file   Highest education level: Not on file  Occupational History    Employer: [employer]  Tobacco Use   Smoking status: Never    Passive exposure: Past (dad smoked as a child)   Smokeless tobacco: Never  Vaping Use   Vaping Use: Never used  Substance and Sexual Activity   Alcohol use: Yes     Alcohol/week: 0.0 standard drinks of alcohol    Comment: socially twice month   Drug use: No   Sexual activity: Yes    Partners: Male    Birth control/protection: Surgical  Other Topics Concern   Not on file  Social History Narrative   Three girls 16,26, 28   Social Determinants of Health   Financial Resource Strain: Not on file  Food Insecurity: Not on file  Transportation Needs: Not on file  Physical Activity: Not on file  Stress: Not on file  Social Connections: Not on file  Intimate Partner Violence: Not on file    Outpatient Medications Prior to Visit  Medication Sig Dispense Refill   anastrozole (ARIMIDEX) 1 MG tablet TAKE 1 TABLET BY MOUTH EVERY DAY (Patient not taking: Reported on 06/05/2022) 90 tablet 4   b complex vitamins tablet Take 1 tablet by mouth daily. (Patient not taking: Reported on 06/05/2022)     metFORMIN (GLUCOPHAGE) 500 MG tablet Take 1 tablet (500 mg total) by mouth 2 (two) times daily with a meal. (Patient not taking: Reported on 06/05/2022) 60 tablet 2   rosuvastatin (CRESTOR) 5 MG tablet Take 1 tablet (5 mg total) by mouth daily. (Patient not taking: Reported on 06/05/2022) 30 tablet 1   Semaglutide-Weight Management (WEGOVY) 0.25 MG/0.5ML SOAJ Inject 0.25 mg into the skin once a week. (Patient not taking: Reported on 06/05/2022) 2 mL 0   Vitamin D, Ergocalciferol, (DRISDOL) 1.25 MG (50000 UNIT) CAPS capsule TAKE 1 CAPSULE (50,000 UNITS TOTAL) BY MOUTH EVERY 7 (SEVEN) DAYS. (Patient not taking: Reported on 06/05/2022) 12 capsule 4   No facility-administered medications prior to visit.    Allergies  Allergen Reactions   Adhesive [Tape] Other (See Comments)    Band Aids causes Skin blisters.  All other tape is okay to use       Objective:    Physical Exam  Gen: NAD, resting comfortably CV: RRR with no murmurs appreciated Pulm: NWOB, CTAB with no crackles, wheezes, or rhonchi Skin: warm, dry Psych: Normal affect and thought content  BP 122/82    Pulse 88   Temp 98.7 F (37.1 C)   Resp 16   Ht 5' 2" (1.575 m)   Wt 212 lb 4 oz (96.3 kg)   LMP 04/02/2015 (Approximate) Comment: after chemo - no periods  SpO2 98%   BMI 38.82 kg/m  Wt Readings from Last 3 Encounters:  06/05/22 212 lb 4 oz (96.3 kg)  02/03/22 229 lb (103.9 kg)  01/15/22 219 lb 12.8 oz (99.7 kg)     Health Maintenance Due  Topic Date Due   COVID-19 Vaccine (1) Never done   FOOT EXAM  Never done   OPHTHALMOLOGY EXAM  Never done   URINE MICROALBUMIN  Never done   Hepatitis C Screening    Never done   TETANUS/TDAP  Never done   PAP SMEAR-Modifier  02/26/2021    There are no preventive care reminders to display for this patient.  Lab Results  Component Value Date   TSH 2.860 02/03/2022   Lab Results  Component Value Date   WBC 6.2 02/03/2022   HGB 13.4 02/03/2022   HCT 39.0 02/03/2022   MCV 88 02/03/2022   PLT 329 02/03/2022   Lab Results  Component Value Date   NA 139 02/03/2022   K 4.0 02/03/2022   CHLORIDE 105 10/21/2017   CO2 20 02/03/2022   GLUCOSE 146 (H) 02/03/2022   BUN 14 02/03/2022   CREATININE 0.76 02/03/2022   BILITOT 0.9 02/03/2022   ALKPHOS 96 02/03/2022   AST 33 02/03/2022   ALT 42 (H) 02/03/2022   PROT 7.3 02/03/2022   ALBUMIN 4.6 02/03/2022   CALCIUM 10.1 02/03/2022   ANIONGAP 11 01/15/2022   EGFR 97 02/03/2022   Lab Results  Component Value Date   CHOL 326 (H) 02/03/2022   Lab Results  Component Value Date   HDL 49 02/03/2022   Lab Results  Component Value Date   LDLCALC 239 (H) 02/03/2022   Lab Results  Component Value Date   TRIG 192 (H) 02/03/2022   Lab Results  Component Value Date   CHOLHDL 6.7 (H) 02/03/2022   Lab Results  Component Value Date   HGBA1C 7.7 (H) 02/03/2022      Assessment & Plan:   Problem List Items Addressed This Visit       Cardiovascular and Mediastinum   Atherosclerosis of aorta (HCC)    Work on low chol diet and exercise as tolerated      Relevant Medications    rosuvastatin (CRESTOR) 5 MG tablet     Digestive   RESOLVED: Focal nodular hyperplasia of liver by MRI      Endocrine   Diabetes mellitus without complication (HCC)    Looked over last a1c likely will need to start metformin  Advised her to restart at 500 mg po qd for one week then increase to twice daily after one week Also to consider ozempic , mounjaro or wegovy pending a1c result today  Ordered hga1c and urine microalbumin today pending results. Work on diabetic diet and exercise as tolerated. Yearly foot exam, and annual eye exam.        Relevant Medications   metFORMIN (GLUCOPHAGE) 500 MG tablet   rosuvastatin (CRESTOR) 5 MG tablet   Other Relevant Orders   Comprehensive metabolic panel   CBC with Differential/Platelet   Microalbumin / creatinine urine ratio   Hemoglobin A1c   Ambulatory referral to Ophthalmology     Other   Elevated liver function tests - Primary   Vitamin D deficiency   Relevant Orders   VITAMIN D 25 Hydroxy (Vit-D Deficiency, Fractures)   Mixed hyperlipidemia    Did advise pt to start crestor as with very elevated cholesterol.  Lipid panel ordered pending results.        Relevant Medications   rosuvastatin (CRESTOR) 5 MG tablet   Other Relevant Orders   Lipid panel   Vitamin B12 deficiency    Ordering b12  Recommend daily 1000 mcg once daily otc b12      Relevant Orders   Vitamin B12   Ventral hernia without obstruction or gangrene   Other Visit Diagnoses     Type 2 diabetes mellitus with other specified complication, without long-term current use of insulin (HCC)         Relevant Medications   metFORMIN (GLUCOPHAGE) 500 MG tablet   rosuvastatin (CRESTOR) 5 MG tablet   Hyperlipidemia associated with type 2 diabetes mellitus (HCC)       Relevant Medications   metFORMIN (GLUCOPHAGE) 500 MG tablet   rosuvastatin (CRESTOR) 5 MG tablet       Meds ordered this encounter  Medications   metFORMIN (GLUCOPHAGE) 500 MG tablet    Sig: Take  1 tablet (500 mg total) by mouth 2 (two) times daily with a meal.    Dispense:  60 tablet    Refill:  2    Order Specific Question:   Supervising Provider    Answer:   BEDSOLE, AMY E [[id number]]   rosuvastatin (CRESTOR) 5 MG tablet    Sig: Take 1 tablet (5 mg total) by mouth daily.    Dispense:  30 tablet    Refill:  1    Order Specific Question:   Supervising Provider    Answer:   Diona Browner, AMY E [[id number]]    Follow-up: Return in about 3 months (around 09/05/2022) for regular follow up appointment .    Eugenia Pancoast, FNP

## 2022-06-05 NOTE — Assessment & Plan Note (Addendum)
Looked over last a1c likely will need to start metformin  Advised her to restart at 500 mg po qd for one week then increase to twice daily after one week Also to consider ozempic , mounjaro or wegovy pending a1c result today  Ordered hga1c and urine microalbumin today pending results. Work on diabetic diet and exercise as tolerated. Yearly foot exam, and annual eye exam.

## 2022-06-05 NOTE — Progress Notes (Signed)
Your vitamin D was a bit on the lower range. I will send an RX for vitamin D3 50,000 IU which you will take once weekly for 8 weeks. Once RX is complete, please continue over the counter Vitamin D3 1000 IU once daily. Return to the clinic for a follow up in three months to repeat your Vitamin D level.   Hga1c improved but still higher than goal. Let's start metformin as discussed. I can also send in ozempic to see if we can get approved.   Cholesterol is VERY high as suspected. Work on low cholesterol and start crestor.  B12 on lower end of normal recommend daily otc vitamin b12 1000 mcg

## 2022-06-10 ENCOUNTER — Encounter: Payer: Self-pay | Admitting: *Deleted

## 2022-06-12 ENCOUNTER — Inpatient Hospital Stay: Payer: 59 | Admitting: Hematology and Oncology

## 2022-06-12 ENCOUNTER — Inpatient Hospital Stay: Payer: 59

## 2022-07-06 ENCOUNTER — Ambulatory Visit
Admission: EM | Admit: 2022-07-06 | Discharge: 2022-07-06 | Disposition: A | Discharge status: Home / Self Care | Payer: 59 | Attending: Physician Assistant | Admitting: Physician Assistant

## 2022-07-06 ENCOUNTER — Other Ambulatory Visit: Payer: Self-pay

## 2022-07-06 DIAGNOSIS — J069 Acute upper respiratory infection, unspecified: Secondary | ICD-10-CM | POA: Insufficient documentation

## 2022-07-06 DIAGNOSIS — Z20822 Contact with and (suspected) exposure to covid-19: Secondary | ICD-10-CM | POA: Diagnosis not present

## 2022-07-06 NOTE — ED Provider Notes (Signed)
[name]    CSN: [id number] Arrival date & time: 07/06/22  0849      History   Chief Complaint Chief Complaint  Patient presents with   URI    HPI Cheryl [name] is a 46 y.o. female.   She here today for evaluation of nasal congestion and productive cough that started 4 days ago.  She has not had fever.  She reports some change in her taste.  She has taken over-the-counter medication without significant relief.  She does report she took a COVID test the day after symptoms began and that was negative.  The history is provided by the patient.    Past Medical History:  Diagnosis Date   Allergy    pollen   Breast cancer (Cheryl [name]) 2016 and 2018   left breast, surgery and chemo done 2018   Cellulitis 07/25/2018   left breast   Chemotherapy-induced neuropathy (Cheryl [name]) 07/11/2015   Malignant neoplasm of upper-outer quadrant of left breast in female, estrogen receptor negative (Cheryl [name]) 02/13/2015   Osteopenia 12/25/2019   Pre-diabetes    Recurrent breast cancer, left (Cheryl [name]) 03/18/2017   S/P breast reconstruction 09/13/2018   S/P laparoscopic assisted vaginal hysterectomy (LAV[facility]) 09/08/2018   S/P mastectomy, bilateral 10/07/2018   SVD (spontaneous vaginal delivery)    x 3   Wears contact lenses     Patient Active Problem List   Diagnosis Date Noted   Elevated liver function tests 06/05/2022   Diabetes mellitus without complication (Cheryl [name]) 76/16/0737   Vitamin D deficiency 06/05/2022   Mixed hyperlipidemia 06/05/2022   Atherosclerosis of aorta (Cheryl [name]) 06/05/2022   Vitamin B12 deficiency 06/05/2022   Ventral hernia without obstruction or gangrene 06/05/2022   Morbid obesity (Cheryl [name]) 04/04/2018    Past Surgical History:  Procedure Laterality Date   ABDOMINAL HYSTERECTOMY     BILATERAL TOTAL MASTECTOMY WITH AXILLARY LYMPH NODE DISSECTION  04/20/2017   BREAST BIOPSY Left 02/18/2015   BREAST BIOPSY Right 02/18/2015   BREAST EXCISIONAL BIOPSY Left 04/05/2015    BREAST IMPLANT REMOVAL Left 09/15/2018   Procedure: REMOVAL BREAST IMPLANTS;  Surgeon: Cheryl Going, DO;  Location: [place];  Service: Plastics;  Laterality: Left;   BREAST LUMPECTOMY Left 03/27/2015   BREAST LUMPECTOMY WITH NEEDLE LOCALIZATION AND AXILLARY SENTINEL LYMPH NODE BX Left 03/27/2015   Procedure: LEFT BREAST LUMPECTOMY WITH BRACKETED NEEDLE LOCALIZATION AND LEFT  AXILLARY SENTINEL LYMPH NODE BX;  Surgeon: Cheryl Seltzer, MD;  Location: [place];  Service: General;  Laterality: Left;   BREAST RECONSTRUCTION Left 03/06/2021   Procedure: revision of left breast flap;  Surgeon: Cheryl Going, DO;  Location: [place];  Service: Plastics;  Laterality: Left;   BREAST RECONSTRUCTION Bilateral 09/18/2021   Procedure: Bilateral Nipple reconstruction with tissue rearrangement;  Surgeon: Cheryl Going, DO;  Location: [place];  Service: Plastics;  Laterality: Bilateral;   BREAST RECONSTRUCTION WITH PLACEMENT OF TISSUE EXPANDER AND FLEX HD (ACELLULAR HYDRATED DERMIS) Bilateral 04/20/2017   Procedure: BILATARAL BREAST RECONSTRUCTION WITH PLACEMENT OF TISSUE EXPANDER AND FLEX HD (ACELLULAR HYDRATED DERMIS);  Surgeon: Cheryl Going, DO;  Location: [place];  Service: Plastics;  Laterality: Bilateral;   BREAST REDUCTION SURGERY Right 12/28/2018   Procedure: Liposuction of medial right breast for improved symmetry;  Surgeon: Cheryl Going, DO;  Location: [place];  Service: Plastics;  Laterality: Right;   BREAST SURGERY Bilateral    masectomy   CHOLECYSTECTOMY N/A 07/07/2017   Procedure: LAPAROSCOPIC CHOLECYSTECTOMY  WITH INTRAOPERATIVE CHOLANGIOGRAM;  Surgeon: Cheryl Seltzer, MD;  Location: [facility];  Service: General;  Laterality: N/A;   Insertion of Essure  yrs ago   For Birth control   LAPAROSCOPIC VAGINAL HYSTERECTOMY WITH SALPINGO OOPHORECTOMY Bilateral 09/08/2018   Procedure: LAPAROSCOPIC ASSISTED  VAGINAL HYSTERECTOMY WITH SALPINGO OOPHORECTOMY;  Surgeon: Cheryl Compton, MD;  Location: [place];  Service: Gynecology;  Laterality: Bilateral;   LATISSIMUS FLAP TO BREAST Left 09/15/2018   Procedure: LATISSIMUS FLAP TO LEFT BREAST;  Surgeon: Cheryl Going, DO;  Location: [name];  Service: Plastics;  Laterality: Left;   LIPOSUCTION WITH LIPOFILLING Bilateral 05/20/2018   Procedure: Bilateral breast fat grafting from abdomen to improve symmetry following breast reconstruction;  Surgeon: Cheryl Going, DO;  Location: [name];  Service: Plastics;  Laterality: Bilateral;   LIPOSUCTION WITH LIPOFILLING Bilateral 03/06/2021   Procedure: Lipo filling and liposuction for improved symmetry;  Surgeon: Cheryl Going, DO;  Location: [place];  Service: Plastics;  Laterality: Bilateral;   MASTECTOMY W/ SENTINEL NODE BIOPSY Bilateral 04/20/2017   Procedure: BILATERAL TOTAL MASTECTOMY WITH LEFT AXILLARY SENTINEL LYMPH NODE BIOPSY;  Surgeon: Cheryl Seltzer, MD;  Location: [name];  Service: General;  Laterality: Bilateral;   PORTA CATH REMOVAL Right 11/04/2017   Procedure: PORTA CATH REMOVAL;  Surgeon: Cheryl Going, DO;  Location: [place];  Service: Plastics;  Laterality: Right;   PORTACATH PLACEMENT Right 04/25/2015   Procedure: INSERTION PORT-A-CATH;  Surgeon: Cheryl Seltzer, MD;  Location: [name];  Service: General;  Laterality: Right;, renoved in 2016   PORTACATH PLACEMENT Right 04/20/2017   Procedure: INSERTION PORT-A-CATH;  Surgeon: Cheryl Seltzer, MD;  Location: [name];  Service: General;  Laterality: Right;   RE-EXCISION OF BREAST LUMPECTOMY Left 04/05/2015   Procedure: RE-EXCISION LEFT  BREAST LUMPECTOMY;  Surgeon: Cheryl Seltzer, MD;  Location: [name];  Service: General;  Laterality: Left;   REMOVAL OF TISSUE EXPANDER AND PLACEMENT OF IMPLANT Bilateral  11/04/2017   Procedure: BILATERAL REMOVAL OF TISSUE EXPANDER AND PLACEMENT OF  BILATERAL BREAST IMPLANT;  Surgeon: Cheryl Going, DO;  Location: [name];  Service: Plastics;  Laterality: Bilateral;   REMOVAL OF TISSUE EXPANDER AND PLACEMENT OF IMPLANT Left 12/28/2018   Procedure: Removal of left breast expander and placement of silicone implant.;  Surgeon: Cheryl Going, DO;  Location: [name];  Service: Plastics;  Laterality: Left;   TISSUE EXPANDER PLACEMENT Left 09/15/2018   Procedure: PLACEMENT OF TISSUE EXPANDER;  Surgeon: Cheryl Going, DO;  Location: [place];  Service: Plastics;  Laterality: Left;    OB History   No obstetric history on file.      Home Medications    Prior to Admission medications   Medication Sig Start Date End Date Taking? Authorizing Provider  metFORMIN (GLUCOPHAGE) 500 MG tablet Take 1 tablet (500 mg total) by mouth 2 (two) times daily with a meal. 06/05/22   Eugenia Pancoast, FNP  rosuvastatin (CRESTOR) 5 MG tablet Take 1 tablet (5 mg total) by mouth daily. 06/05/22   Eugenia Pancoast, FNP  Semaglutide,0.25 or 0.'5MG'$ /DOS, (OZEMPIC, 0.25 OR 0.5 MG/DOSE,) 2 MG/1.5ML SOPN Inject 0.25 mg into the skin once a week for 30 days, THEN 0.5 mg once a week. 06/05/22 08/03/22  Eugenia Pancoast, FNP  Vitamin D, Ergocalciferol, (DRISDOL) 1.25 MG (50000 UNIT) CAPS capsule Take 1 capsule (50,000 Units total) by mouth every 7 (seven) days for 8 doses.  06/05/22 07/25/22  Eugenia Pancoast, FNP    Family History Family History  Problem Relation Age of Onset   Diabetes Mother    Hypertension Mother    COPD Father    Lung cancer Maternal Grandmother 63   Lung cancer Maternal Grandfather 64       non smoker worked in [place]   Diabetes Paternal Grandmother    Hypertension Paternal Grandmother    Throat cancer Paternal Grandfather 35   Esophageal cancer Paternal Uncle    Colon cancer Neg Hx    Colon polyps Neg Hx    Rectal cancer Neg  Hx    Stomach cancer Neg Hx     Social History Social History   Tobacco Use   Smoking status: Never    Passive exposure: Past (dad smoked as a child)   Smokeless tobacco: Never  Vaping Use   Vaping Use: Never used  Substance Use Topics   Alcohol use: Yes    Alcohol/week: 0.0 standard drinks of alcohol    Comment: socially twice month   Drug use: No     Allergies   Adhesive [tape]   Review of Systems Review of Systems  Constitutional:  Negative for chills and fever.  HENT:  Positive for congestion and sore throat. Negative for ear pain.   Eyes:  Negative for discharge and redness.  Respiratory:  Positive for cough. Negative for shortness of breath and wheezing.   Gastrointestinal:  Negative for abdominal pain, diarrhea, nausea and vomiting.     Physical Exam Triage Vital Signs ED Triage Vitals  Enc Vitals Group     BP      Pulse      Resp      Temp      Temp src      SpO2      Weight      Height      Head Circumference      Peak Flow      Pain Score      Pain Loc      Pain Edu?      Excl. in [place]?    No data found.  Updated Vital Signs BP (!) 138/98 (BP Location: Left Arm)   Pulse 91   Temp 98.8 F (37.1 C) (Oral)   Resp 17   LMP 04/02/2015 (Approximate) Comment: after chemo - no periods  SpO2 97%      Physical Exam Vitals and nursing note reviewed.  Constitutional:      General: [name] not in acute distress.    Appearance: Normal appearance. [name] not ill-appearing.  HENT:     Head: Normocephalic and atraumatic.     Right Ear: Tympanic membrane normal.     Left Ear: Tympanic membrane normal.     Nose: Congestion present.     Mouth/Throat:     Mouth: Mucous membranes are moist.     Pharynx: No oropharyngeal exudate or posterior oropharyngeal erythema.  Eyes:     Conjunctiva/sclera: Conjunctivae normal.  Cardiovascular:     Rate and Rhythm: Normal rate and regular rhythm.     Heart sounds: Normal heart sounds. No murmur  heard. Pulmonary:     Effort: Pulmonary effort is normal. No respiratory distress.     Breath sounds: Normal breath sounds. No wheezing, rhonchi or rales.  Skin:    General: Skin is warm and dry.  Neurological:     Mental Status: [name] alert.  Psychiatric:  Mood and Affect: Mood normal.        Thought Content: Thought content normal.      UC Treatments / Results  Labs (all labs ordered are listed, but only abnormal results are displayed) Labs Reviewed  SARS CORONAVIRUS 2 (TAT 6-24 HRS)    EKG   Radiology No results found.  Procedures Procedures (including critical care time)  Medications Ordered in UC Medications - No data to display  Initial Impression / Assessment and Plan / UC Course  I have reviewed the triage vital signs and the nursing notes.  Pertinent labs & imaging results that were available during my care of the patient were reviewed by me and considered in my medical decision making (see chart for details).    COVID screening ordered.  Suspect likely viral etiology of symptoms.  Patient is concern for sinusitis versus bronchitis but discussed that given short duration of symptoms most likely this is viral in nature.  I did recommend follow-up if symptoms persisted over 1 week total or with any worsening.  Patient expresses understanding.  Final Clinical Impressions(s) / UC Diagnoses   Final diagnoses:  Acute upper respiratory infection   Discharge Instructions   None    ED Prescriptions   None    PDMP not reviewed this encounter.   Francene Finders, PA-C 07/06/22 1017

## 2022-07-06 NOTE — ED Triage Notes (Signed)
Pt presents with nasal congestion and productive cough X 4 days.

## 2022-07-07 LAB — SARS CORONAVIRUS 2 (TAT 6-24 HRS): SARS Coronavirus 2: NEGATIVE

## 2022-07-10 ENCOUNTER — Ambulatory Visit: Payer: 59 | Admitting: Family

## 2022-07-13 ENCOUNTER — Other Ambulatory Visit: Payer: Self-pay | Admitting: Physician Assistant

## 2022-07-13 ENCOUNTER — Other Ambulatory Visit: Payer: Self-pay | Admitting: Family

## 2022-07-13 DIAGNOSIS — E119 Type 2 diabetes mellitus without complications: Secondary | ICD-10-CM

## 2022-07-13 DIAGNOSIS — E1169 Type 2 diabetes mellitus with other specified complication: Secondary | ICD-10-CM

## 2022-07-15 ENCOUNTER — Other Ambulatory Visit: Payer: Self-pay | Admitting: Family

## 2022-07-15 ENCOUNTER — Encounter: Payer: Self-pay | Admitting: Family

## 2022-07-15 DIAGNOSIS — E1169 Type 2 diabetes mellitus with other specified complication: Secondary | ICD-10-CM

## 2022-07-15 DIAGNOSIS — E119 Type 2 diabetes mellitus without complications: Secondary | ICD-10-CM

## 2022-07-15 NOTE — Telephone Encounter (Signed)
Pending mychart response

## 2022-07-20 MED ORDER — OZEMPIC (0.25 OR 0.5 MG/DOSE) 2 MG/3ML ~~LOC~~ SOPN: 0.5000 mg | PEN_INJECTOR | SUBCUTANEOUS | 2 refills | Status: DC

## 2022-07-20 NOTE — Telephone Encounter (Signed)
Ca we look into prior auth? Has been on ozempic 0.25 and 0.5 mg , new start. Went for refill and states needs prior auth.

## 2022-07-20 NOTE — Telephone Encounter (Signed)
refilled 

## 2022-07-21 ENCOUNTER — Telehealth: Payer: Self-pay

## 2022-07-21 NOTE — Telephone Encounter (Signed)
Noted  

## 2022-07-21 NOTE — Telephone Encounter (Signed)
Prior auth for Ozempic (0.25 or 0.5 MG/DOSE) '2MG'$ /3ML pen-injectors has been approved. [facility]yl [name] (Key: [id number]) Your request for Ozempic Inj '2mg'$ /48m has been approved. How long does this approval last? OZEMPIC INJ '2MG'$ /3ML, use as directed (324mper month), is approved for 12 months through 07/22/2023 or until coverage for the medication is no longer available under your benefit plan or the medication becomes subject to a pharmacy benefit coverage requirement, such as supply limits or notification, whichever occurs first as allowed by law. Please note: Doses/quantities above plan limits and/or maximum Food and Drug Administration (FDA) approved dosing may be subject to further review.  Approval letter sent to scanning.  Notified patient via mychart.

## 2022-07-21 NOTE — Telephone Encounter (Signed)
Prior auth started for Ozempic (0.25 or 0.5 MG/DOSE) '2MG'$ /3ML pen-injectors. Cheryl [name] (Key: [id number]) Waiting for determination.

## 2022-07-22 NOTE — Telephone Encounter (Signed)
Noted  

## 2022-09-11 ENCOUNTER — Ambulatory Visit: Payer: 59 | Admitting: Family

## 2022-09-14 ENCOUNTER — Encounter: Payer: Self-pay | Admitting: Family

## 2022-09-14 ENCOUNTER — Other Ambulatory Visit: Payer: Self-pay | Admitting: Family

## 2022-09-14 ENCOUNTER — Ambulatory Visit: Payer: 59 | Admitting: Family

## 2022-09-14 DIAGNOSIS — E1169 Type 2 diabetes mellitus with other specified complication: Secondary | ICD-10-CM | POA: Diagnosis not present

## 2022-09-14 DIAGNOSIS — E119 Type 2 diabetes mellitus without complications: Secondary | ICD-10-CM

## 2022-09-14 DIAGNOSIS — E785 Hyperlipidemia, unspecified: Secondary | ICD-10-CM

## 2022-09-14 LAB — LIPID PANEL
Cholesterol: 197 mg/dL (ref 0–200)
HDL: 49.6 mg/dL (ref >39.00)
LDL Cholesterol: 117 mg/dL — ABNORMAL HIGH (ref 0–99)
NonHDL: 147.12
Total CHOL/HDL Ratio: 4
Triglycerides: 153 mg/dL — ABNORMAL HIGH (ref 0.0–149.0)
VLDL: 30.6 mg/dL (ref 0.0–40.0)

## 2022-09-14 LAB — HEMOGLOBIN A1C: Hgb A1c MFr Bld: 6.2 % (ref 4.6–6.5)

## 2022-09-14 MED ORDER — METFORMIN HCL ER 750 MG PO TB24: 750.0000 mg | ORAL_TABLET | Freq: Every day | ORAL | 1 refills | Status: DC

## 2022-09-14 MED ORDER — ROSUVASTATIN CALCIUM 10 MG PO TABS: 10.0000 mg | ORAL_TABLET | Freq: Every day | ORAL | 3 refills | Status: DC

## 2022-09-14 MED ORDER — ROSUVASTATIN CALCIUM 5 MG PO TABS: 5.0000 mg | ORAL_TABLET | Freq: Every day | ORAL | 1 refills | Status: DC

## 2022-09-14 MED ORDER — SEMAGLUTIDE (1 MG/DOSE) 4 MG/3ML ~~LOC~~ SOPN: 1.0000 mg | PEN_INJECTOR | SUBCUTANEOUS | 2 refills | Status: DC

## 2022-09-14 NOTE — Assessment & Plan Note (Signed)
a1c today  rx metformin 750 mg once daily  Stp twice daily due to noncompliance Start increase ozempic to 1 mg weekly  Pt advised of following:Work on diab diet exercise as tolerated

## 2022-09-14 NOTE — Progress Notes (Signed)
Established Patient Office Visit  Subjective:  Patient ID: Cheryl [name], female    DOB: Apr 18, 1976  Age: 46 y.o. MRN: [id number]  CC:  Chief Complaint  Patient presents with   Medication Refill    HPI Cheryl [name] is here today for follow up.   DM2: tolerating ozempic 0.5 mg once weekly well. [name] having some weight loss also.  [name] following low carb diet, walks some but not very often. Not taking metformin due to being forgetful to take it as well.  Lab Results  Component Value Date   HGBA1C 6.9 (H) 06/05/2022  ` Vitamin D def: completed Rx dosing, didn't start otc yet.   Hld: rosuvasttin 5 mg nightly but states non-compliance due to forgetting to take it at night.    Past Medical History:  Diagnosis Date   Allergy    pollen   Breast cancer ([place]) 2016 and 2018   left breast, surgery and chemo done 2018   Cellulitis 07/25/2018   left breast   Chemotherapy-induced neuropathy ([place]) 07/11/2015   Malignant neoplasm of upper-outer quadrant of left breast in female, estrogen receptor negative ([place]) 02/13/2015   Osteopenia 12/25/2019   Pre-diabetes    Recurrent breast cancer, left ([place]) 03/18/2017   S/P breast reconstruction 09/13/2018   S/P laparoscopic assisted vaginal hysterectomy ([facility]) 09/08/2018   S/P mastectomy, bilateral 10/07/2018   SVD (spontaneous vaginal delivery)    x 3   Wears contact lenses     Past Surgical History:  Procedure Laterality Date   ABDOMINAL HYSTERECTOMY     BILATERAL TOTAL MASTECTOMY WITH AXILLARY LYMPH NODE DISSECTION  04/20/2017   BREAST BIOPSY Left 02/18/2015   BREAST BIOPSY Right 02/18/2015   BREAST EXCISIONAL BIOPSY Left 04/05/2015   BREAST IMPLANT REMOVAL Left 09/15/2018   Procedure: REMOVAL BREAST IMPLANTS;  Surgeon: Wallace Going, DO;  Location: [place];  Service: Plastics;  Laterality: Left;   BREAST LUMPECTOMY Left 03/27/2015   BREAST LUMPECTOMY WITH NEEDLE LOCALIZATION AND AXILLARY SENTINEL  LYMPH NODE BX Left 03/27/2015   Procedure: LEFT BREAST LUMPECTOMY WITH BRACKETED NEEDLE LOCALIZATION AND LEFT  AXILLARY SENTINEL LYMPH NODE BX;  Surgeon: [name], MD;  Location: [place];  Service: General;  Laterality: Left;   BREAST RECONSTRUCTION Left 03/06/2021   Procedure: revision of left breast flap;  Surgeon: Wallace Going, DO;  Location: [place];  Service: Plastics;  Laterality: Left;   BREAST RECONSTRUCTION Bilateral 09/18/2021   Procedure: Bilateral Nipple reconstruction with tissue rearrangement;  Surgeon: Wallace Going, DO;  Location: [place];  Service: Plastics;  Laterality: Bilateral;   BREAST RECONSTRUCTION WITH PLACEMENT OF TISSUE EXPANDER AND FLEX HD (ACELLULAR HYDRATED DERMIS) Bilateral 04/20/2017   Procedure: BILATARAL BREAST RECONSTRUCTION WITH PLACEMENT OF TISSUE EXPANDER AND FLEX HD (ACELLULAR HYDRATED DERMIS);  Surgeon: Wallace Going, DO;  Location: [place];  Service: Plastics;  Laterality: Bilateral;   BREAST REDUCTION SURGERY Right 12/28/2018   Procedure: Liposuction of medial right breast for improved symmetry;  Surgeon: Wallace Going, DO;  Location: [place];  Service: Plastics;  Laterality: Right;   BREAST SURGERY Bilateral    masectomy   CHOLECYSTECTOMY N/A 07/07/2017   Procedure: LAPAROSCOPIC CHOLECYSTECTOMY WITH INTRAOPERATIVE CHOLANGIOGRAM;  Surgeon: [name], MD;  Location: [facility];  Service: General;  Laterality: N/A;   Insertion of Essure  yrs ago   For Birth control   LAPAROSCOPIC VAGINAL HYSTERECTOMY WITH SALPINGO OOPHORECTOMY Bilateral 09/08/2018  Procedure: LAPAROSCOPIC ASSISTED VAGINAL HYSTERECTOMY WITH SALPINGO OOPHORECTOMY;  Surgeon: [name], MD;  Location[facility];  Service: Gynecology;  Laterality: Bilateral;   LATISSIMUS FLAP TO BREAST Left 09/15/2018   Procedure: LATISSIMUS FLAP TO LEFT BREAST;  Surgeon: Wallace Going, DO;  Location: [place];  Service: Plastics;  Laterality: Left;   LIPOSUCTION WITH LIPOFILLING Bilateral 05/20/2018   Procedure: Bilateral breast fat grafting from abdomen to improve symmetry following breast reconstruction;  Surgeon: Wallace Going, DO;  Location: [place];  Service: Plastics;  Laterality: Bilateral;   LIPOSUCTION WITH LIPOFILLING Bilateral 03/06/2021   Procedure: Lipo filling and liposuction for improved symmetry;  Surgeon: Wallace Going, DO;  Location: [place];  Service: Plastics;  Laterality: Bilateral;   MASTECTOMY W/ SENTINEL NODE BIOPSY Bilateral 04/20/2017   Procedure: BILATERAL TOTAL MASTECTOMY WITH LEFT AXILLARY SENTINEL LYMPH NODE BIOPSY;  Surgeon: [name], MD;  Location: [place];  Service: General;  Laterality: Bilateral;   PORTA CATH REMOVAL Right 11/04/2017   Procedure: PORTA CATH REMOVAL;  Surgeon: Wallace Going, DO;  Location: [place];  Service: Plastics;  Laterality: Right;   PORTACATH PLACEMENT Right 04/25/2015   Procedure: INSERTION PORT-A-CATH;  Surgeon: [name], MD;  Location: [place];  Service: General;  Laterality: Right;, renoved in 2016   PORTACATH PLACEMENT Right 04/20/2017   Procedure: INSERTION PORT-A-CATH;  Surgeon: [name], MD;  Location: [place];  Service: General;  Laterality: Right;   RE-EXCISION OF BREAST LUMPECTOMY Left 04/05/2015   Procedure: RE-EXCISION LEFT  BREAST LUMPECTOMY;  Surgeon: [name], MD;  Location: [place];  Service: General;  Laterality: Left;   REMOVAL OF TISSUE EXPANDER AND PLACEMENT OF IMPLANT Bilateral 11/04/2017   Procedure: BILATERAL REMOVAL OF TISSUE EXPANDER AND PLACEMENT OF  BILATERAL BREAST IMPLANT;  Surgeon: Wallace Going, DO;  Location: [place];  Service: Plastics;  Laterality: Bilateral;   REMOVAL OF TISSUE EXPANDER AND PLACEMENT OF IMPLANT Left  12/28/2018   Procedure: Removal of left breast expander and placement of silicone implant.;  Surgeon: Wallace Going, DO;  Location: [place];  Service: Plastics;  Laterality: Left;   TISSUE EXPANDER PLACEMENT Left 09/15/2018   Procedure: PLACEMENT OF TISSUE EXPANDER;  Surgeon: Wallace Going, DO;  Location: [place];  Service: Plastics;  Laterality: Left;    Family History  Problem Relation Age of Onset   Diabetes Mother    Hypertension Mother    COPD Father    Lung cancer Maternal Grandmother 36   Lung cancer Maternal Grandfather 28       non smoker worked in [place]   Diabetes Paternal Grandmother    Hypertension Paternal Grandmother    Throat cancer Paternal Grandfather 17   Esophageal cancer Paternal Uncle    Colon cancer Neg Hx    Colon polyps Neg Hx    Rectal cancer Neg Hx    Stomach cancer Neg Hx     Social History   Socioeconomic History   Marital status: Divorced    Spouse name: Not on file   Number of children: 3   Years of education: Not on file   Highest education level: Not on file  Occupational History    Employer: [employer]  Tobacco Use   Smoking status: Never    Passive exposure: Past (dad smoked as a child)   Smokeless tobacco: Never  Vaping Use   Vaping  Use: Never used  Substance and Sexual Activity   Alcohol use: Yes    Alcohol/week: 0.0 standard drinks of alcohol    Comment: socially twice month   Drug use: No   Sexual activity: Yes    Partners: Male    Birth control/protection: Surgical  Other Topics Concern   Not on file  Social History Narrative   Three girls 16,26, 28   Social Determinants of Radio broadcast assistant Strain: Not on file  Food Insecurity: Not on file  Transportation Needs: Not on file  Physical Activity: Not on file  Stress: Not on file  Social Connections: Not on file  Intimate Partner Violence: Not on file    Outpatient Medications Prior to Visit  Medication Sig Dispense  Refill   rosuvastatin (CRESTOR) 5 MG tablet TAKE 1 TABLET (5 MG TOTAL) BY MOUTH DAILY. 30 tablet 1   Semaglutide,0.25 or 0.5MG/DOS, (OZEMPIC, 0.25 OR 0.5 MG/DOSE,) 2 MG/3ML SOPN Inject 0.5 mg into the skin once a week. 3 mL 2   metFORMIN (GLUCOPHAGE) 500 MG tablet Take 1 tablet (500 mg total) by mouth 2 (two) times daily with a meal. (Patient not taking: Reported on 09/14/2022) 60 tablet 2   No facility-administered medications prior to visit.    Allergies  Allergen Reactions   Adhesive [Tape] Other (See Comments)    Band Aids causes Skin blisters.  All other tape is okay to use         Objective:    Physical Exam Constitutional:      General: [name] not in acute distress.    Appearance: Normal appearance. [name] obese. [name] not ill-appearing, toxic-appearing or diaphoretic.  Cardiovascular:     Rate and Rhythm: Normal rate and regular rhythm.  Pulmonary:     Effort: Pulmonary effort is normal.     Breath sounds: Normal breath sounds.  Neurological:     General: No focal deficit present.     Mental Status: [name] alert and oriented to person, place, and time. Mental status is at baseline.  Psychiatric:        Mood and Affect: Mood normal.        Behavior: Behavior normal.        Thought Content: Thought content normal.        Judgment: Judgment normal.       BP 130/88   Pulse 75   Temp 97.9 F (36.6 C)   Resp 16   Ht _0  (1.575 m)   Wt 210 lb (95.3 kg)   LMP 04/02/2015 (Approximate) Comment: after chemo - no periods  SpO2 97%   BMI 38.41 kg/m  Wt Readings from Last 3 Encounters:  09/14/22 210 lb (95.3 kg)  06/05/22 212 lb 4 oz (96.3 kg)  02/03/22 229 lb (103.9 kg)     Health Maintenance Due  Topic Date Due   COVID-19 Vaccine (1) Never done   FOOT EXAM  Never done   OPHTHALMOLOGY EXAM  Never done   Hepatitis C Screening  Never done   TETANUS/TDAP  Never done   PAP SMEAR-Modifier  02/26/2021    There are no preventive care reminders to display  for this patient.  Lab Results  Component Value Date   TSH 2.860 02/03/2022   Lab Results  Component Value Date   WBC 6.1 06/05/2022   HGB 13.2 06/05/2022   HCT 40.1 06/05/2022   MCV 89.5 06/05/2022   PLT 315.0 06/05/2022   Lab Results  Component Value Date   NA 138 06/05/2022   K 4.2 06/05/2022   CHLORIDE 105 10/21/2017   CO2 28 06/05/2022   GLUCOSE 124 (H) 06/05/2022   BUN 8 06/05/2022   CREATININE 0.74 06/05/2022   BILITOT 0.8 06/05/2022   ALKPHOS 69 06/05/2022   AST 20 06/05/2022   ALT 23 06/05/2022   PROT 7.4 06/05/2022   ALBUMIN 4.5 06/05/2022   CALCIUM 9.4 06/05/2022   ANIONGAP 11 01/15/2022   EGFR 97 02/03/2022   GFR 96.36 06/05/2022   Lab Results  Component Value Date   CHOL 271 (H) 06/05/2022   Lab Results  Component Value Date   HDL 49.10 06/05/2022   Lab Results  Component Value Date   LDLCALC 185 (H) 06/05/2022   Lab Results  Component Value Date   TRIG 184.0 (H) 06/05/2022   Lab Results  Component Value Date   CHOLHDL 6 06/05/2022   Lab Results  Component Value Date   HGBA1C 6.9 (H) 06/05/2022      Assessment & Plan:   Problem List Items Addressed This Visit       Endocrine   Diabetes mellitus without complication ([place])    Z0S today  rx metformin 750 mg once daily  Stp twice daily due to noncompliance Start increase ozempic to 1 mg weekly  Pt advised of following:Work on diab diet exercise as tolerated      Relevant Medications   rosuvastatin (CRESTOR) 5 MG tablet   metFORMIN (GLUCOPHAGE-XR) 750 MG 24 hr tablet   Semaglutide, 1 MG/DOSE, 4 MG/3ML SOPN   Other Relevant Orders   Hemoglobin A1c   Hyperlipidemia associated with type 2 diabetes mellitus (HCC)    Ordered lipid panel, pending results. Work on low cholesterol diet and exercise as tolerated       Relevant Medications   rosuvastatin (CRESTOR) 5 MG tablet   metFORMIN (GLUCOPHAGE-XR) 750 MG 24 hr tablet   Semaglutide, 1 MG/DOSE, 4 MG/3ML SOPN   Other Relevant  Orders   Lipid panel    Meds ordered this encounter  Medications   rosuvastatin (CRESTOR) 5 MG tablet    Sig: Take 1 tablet (5 mg total) by mouth daily.    Dispense:  30 tablet    Refill:  1   metFORMIN (GLUCOPHAGE-XR) 750 MG 24 hr tablet    Sig: Take 1 tablet (750 mg total) by mouth daily with breakfast.    Dispense:  90 tablet    Refill:  1    Order Specific Question:   Supervising Provider    Answer:   BEDSOLE, AMY E [[id number]]   Semaglutide, 1 MG/DOSE, 4 MG/3ML SOPN    Sig: Inject 1 mg as directed once a week.    Dispense:  3 mL    Refill:  2    Order Specific Question:   Supervising Provider    Answer:   Diona Browner, AMY E [[id number]]    Follow-up: Return in about 3 months (around 12/15/2022) for f/u diabetes.    Eugenia Pancoast, FNP

## 2022-09-14 NOTE — Patient Instructions (Addendum)
  Start vitamin D3 2000 IU once daily over the counter.   Start vitamin b12 500 mcg once daily over the counter as well.   Increase ozempic to 1 mg weekly.  Change metformin to 750 mg XR in the am.    Regards,   Eugenia Pancoast FNP-C

## 2022-09-14 NOTE — Assessment & Plan Note (Signed)
Ordered lipid panel, pending results. Work on low cholesterol diet and exercise as tolerated ? ?

## 2022-09-15 ENCOUNTER — Encounter: Payer: Self-pay | Admitting: Family

## 2022-09-24 ENCOUNTER — Encounter: Payer: Self-pay | Admitting: Family

## 2022-11-13 ENCOUNTER — Encounter: Payer: Self-pay | Admitting: Family

## 2022-12-15 ENCOUNTER — Other Ambulatory Visit: Payer: Self-pay | Admitting: Family

## 2022-12-15 DIAGNOSIS — E119 Type 2 diabetes mellitus without complications: Secondary | ICD-10-CM

## 2022-12-21 ENCOUNTER — Other Ambulatory Visit: Payer: Self-pay | Admitting: Family

## 2022-12-21 DIAGNOSIS — E119 Type 2 diabetes mellitus without complications: Secondary | ICD-10-CM

## 2022-12-22 ENCOUNTER — Telehealth: Payer: Self-pay | Admitting: Family

## 2022-12-22 NOTE — Telephone Encounter (Signed)
Prescription Request  12/22/2022  Is this a "Controlled Substance" medicine? No  LOV: 09/14/2022  What is the name of the medication or equipment? Semaglutide, 1 MG/DOSE, 4 MG/3ML SOPN   Have you contacted your pharmacy to request a refill? Yes   Which pharmacy would you like this sent to?  [facility], [address] [address] Phone: [phone] Fax: [phone]    Patient notified that their request is being sent to the clinical staff for review and that they should receive a response within 2 business days.   Please advise at Mobile [phone] (mobile)

## 2022-12-23 ENCOUNTER — Encounter: Payer: Self-pay | Admitting: Family

## 2022-12-24 NOTE — Telephone Encounter (Signed)
Left a message to schedule an appt in order to refill Ozempic.

## 2022-12-25 ENCOUNTER — Encounter: Payer: Self-pay | Admitting: Oncology

## 2022-12-28 ENCOUNTER — Encounter: Payer: Self-pay | Admitting: Oncology

## 2022-12-28 ENCOUNTER — Encounter: Payer: Self-pay | Admitting: Family

## 2022-12-28 ENCOUNTER — Ambulatory Visit: Payer: 59 | Admitting: Family

## 2022-12-28 VITALS — BP 128/96 | HR 98 | Temp 98.9°F | Ht 62.0 in | Wt 208.6 lb

## 2022-12-28 DIAGNOSIS — E785 Hyperlipidemia, unspecified: Secondary | ICD-10-CM | POA: Diagnosis not present

## 2022-12-28 DIAGNOSIS — N3000 Acute cystitis without hematuria: Secondary | ICD-10-CM | POA: Diagnosis not present

## 2022-12-28 DIAGNOSIS — E119 Type 2 diabetes mellitus without complications: Secondary | ICD-10-CM | POA: Diagnosis not present

## 2022-12-28 DIAGNOSIS — R300 Dysuria: Secondary | ICD-10-CM

## 2022-12-28 DIAGNOSIS — Z6838 Body mass index (BMI) 38.0-38.9, adult: Secondary | ICD-10-CM

## 2022-12-28 DIAGNOSIS — E1169 Type 2 diabetes mellitus with other specified complication: Secondary | ICD-10-CM | POA: Diagnosis not present

## 2022-12-28 DIAGNOSIS — N3001 Acute cystitis with hematuria: Secondary | ICD-10-CM | POA: Insufficient documentation

## 2022-12-28 LAB — POCT URINALYSIS DIPSTICK
Blood, UA: NEGATIVE
Glucose, UA: NEGATIVE
Ketones, UA: 5
Nitrite, UA: NEGATIVE
Protein, UA: POSITIVE — AB
Spec Grav, UA: 1.02 (ref 1.010–1.025)
Urobilinogen, UA: 0.2 E.U./dL
pH, UA: 6 (ref 5.0–8.0)

## 2022-12-28 LAB — LIPID PANEL
Cholesterol: 255 mg/dL — ABNORMAL HIGH (ref 0–200)
HDL: 50.9 mg/dL (ref >39.00)
NonHDL: 204.35
Total CHOL/HDL Ratio: 5
Triglycerides: 218 mg/dL — ABNORMAL HIGH (ref 0.0–149.0)
VLDL: 43.6 mg/dL — ABNORMAL HIGH (ref 0.0–40.0)

## 2022-12-28 LAB — LDL CHOLESTEROL, DIRECT: Direct LDL: 168 mg/dL

## 2022-12-28 LAB — HEMOGLOBIN A1C: Hgb A1c MFr Bld: 6.2 % (ref 4.6–6.5)

## 2022-12-28 MED ORDER — CIPROFLOXACIN HCL 500 MG PO TABS: 500.0000 mg | ORAL_TABLET | Freq: Two times a day (BID) | ORAL | 0 refills | Status: AC

## 2022-12-28 NOTE — Assessment & Plan Note (Signed)
Ddx pyelonephritis  poct urine dip in office Urine culture ordered pending results antbx sent to pharmacy, pt to take as directed. Encouraged increased water intake throughout the day. Choosing to treat due to being symptomatic. If no improvement in the next 2 days pt advised to let me know.  Did RX ciprofloxacin 500 mg po bid x 7 days.  Did d/w pt risks associated with ciprofloxacin to include increased risk for tendinitis

## 2022-12-28 NOTE — Assessment & Plan Note (Signed)
Ordered lipid panel, pending results. Work on low cholesterol diet and exercise as tolerated Continue crestor 10 mg once daily.

## 2022-12-28 NOTE — Assessment & Plan Note (Signed)
Improving.  Pt advised to work on diet and exercise as tolerated

## 2022-12-28 NOTE — Assessment & Plan Note (Signed)
Ordered hga1c today pending results. Work on diabetic diet and exercise as tolerated. Yearly foot exam, and annual eye exam.  Continue ozempic 1 mg weekly.  Can stop metformin for now as non compliance and also A1c WNL

## 2022-12-28 NOTE — Patient Instructions (Signed)
It was a pleasure seeing you today.   You were found to have a urinary tract infection, you have been prescribed an antibiotic to your preferred pharmacy. Please start antibiotic today as directed.   We are sending your urine for a culture to make sure you do not have a resistant bacteria. We will call you if we need to change your medications.   Please make sure you are drinking plenty of fluids over the next few days.  If your symptoms do not improve over the next 5-7 days, or if they worsen, please let us know. Please also let us know if you have worsening back pain, fevers, chills, or body aches.   Regards,   Fatin Bachicha  

## 2022-12-28 NOTE — Progress Notes (Signed)
Established Patient Office Visit  Subjective:      CC:  Chief Complaint  Patient presents with   Medical Management of Chronic Issues   Urinary Tract Infection    HPI: Cheryl [name] is a 47 y.o. female presenting on 12/28/2022 for Medical Management of Chronic Issues and Urinary Tract Infection .   New complaints: Last week started with right flank pain, increased urinary frequency and urgency, no dysuria. Did take azo at home test and came back positive.  Still ongoing back pain, worse with movement or touching the area, not sharp and stabbing.   DM2: tolerating ozempic 1 mg weekly , tolerating well. Nausea day after but otherwise tolerating well. Denies constipation. Has stopped taking her metformin completely.  Lab Results  Component Value Date   HGBA1C 6.2 09/14/2022   HLD: crestor 10 mg trying to take daily however very non compliant. Takes it maybe three days a week or so.    Wt Readings from Last 3 Encounters:  12/28/22 208 lb 9.6 oz (94.6 kg)  09/14/22 210 lb (95.3 kg)  06/05/22 212 lb 4 oz (96.3 kg)       Social history:  Relevant past medical, surgical, family and social history reviewed and updated as indicated. Interim medical history since our last visit reviewed.  Allergies and medications reviewed and updated.  DATA REVIEWED: CHART IN EPIC     ROS: Negative unless specifically indicated above in HPI.    Current Outpatient Medications:    ciprofloxacin (CIPRO) 500 MG tablet, Take 1 tablet (500 mg total) by mouth 2 (two) times daily for 7 days., Disp: 14 tablet, Rfl: 0   rosuvastatin (CRESTOR) 10 MG tablet, Take 1 tablet (10 mg total) by mouth daily., Disp: 90 tablet, Rfl: 3   Semaglutide, 1 MG/DOSE, 4 MG/3ML SOPN, Inject 1 mg as directed once a week., Disp: 3 mL, Rfl: 2      Objective:    BP (!) 128/96   Pulse 98   Temp 98.9 F (37.2 C) (Temporal)   Ht '5\' 2"'$  (1.575 m)   Wt 208 lb 9.6 oz (94.6 kg)   LMP 04/02/2015  (Approximate) Comment: after chemo - no periods  SpO2 99%   BMI 38.15 kg/m   Wt Readings from Last 3 Encounters:  12/28/22 208 lb 9.6 oz (94.6 kg)  09/14/22 210 lb (95.3 kg)  06/05/22 212 lb 4 oz (96.3 kg)    Physical Exam Constitutional:      General: [name] not in acute distress.    Appearance: Normal appearance. [name] normal weight. [name] not ill-appearing, toxic-appearing or diaphoretic.  Cardiovascular:     Rate and Rhythm: Normal rate.  Pulmonary:     Effort: Pulmonary effort is normal.  Abdominal:     General: Abdomen is flat.     Tenderness: There is abdominal tenderness (suprapubic). There is right CVA tenderness. There is no left CVA tenderness.  Neurological:     General: No focal deficit present.     Mental Status: [name] alert and oriented to person, place, and time. Mental status is at baseline.     Motor: No weakness.  Psychiatric:        Mood and Affect: Mood normal.        Behavior: Behavior normal.        Thought Content: Thought content normal.        Judgment: Judgment normal.            Assessment &  Plan:  Dysuria -     POCT urinalysis dipstick -     Urine Culture  Diabetes mellitus without complication [facility]) Assessment & Plan: Ordered hga1c today pending results. Work on diabetic diet and exercise as tolerated. Yearly foot exam, and annual eye exam.  Continue ozempic 1 mg weekly.  Can stop metformin for now as non compliance and also A1c WNL  Orders: -     Hemoglobin A1c  Hyperlipidemia associated with type 2 diabetes mellitus ([place]) Assessment & Plan: Ordered lipid panel, pending results. Work on low cholesterol diet and exercise as tolerated Continue crestor 10 mg once daily.   Orders: -     Lipid panel  Acute cystitis without hematuria Assessment & Plan: Ddx pyelonephritis  poct urine dip in office Urine culture ordered pending results antbx sent to pharmacy, pt to take as directed. Encouraged increased water intake  throughout the day. Choosing to treat due to being symptomatic. If no improvement in the next 2 days pt advised to let me know.  Did RX ciprofloxacin 500 mg po bid x 7 days.  Did d/w pt risks associated with ciprofloxacin to include increased risk for tendinitis  Orders: -     Ciprofloxacin HCl; Take 1 tablet (500 mg total) by mouth 2 (two) times daily for 7 days.  Dispense: 14 tablet; Refill: 0  Class 2 severe obesity due to excess calories with serious comorbidity and body mass index (BMI) of 38.0 to 38.9 in adult [facility]) Assessment & Plan: Improving.  Pt advised to work on diet and exercise as tolerated       Return in about 6 months (around 06/28/2023) for f/u diabetes.  Eugenia Pancoast, MSN, APRN, FNP-C [place]

## 2022-12-29 ENCOUNTER — Other Ambulatory Visit: Payer: Self-pay | Admitting: Family

## 2022-12-29 DIAGNOSIS — E1169 Type 2 diabetes mellitus with other specified complication: Secondary | ICD-10-CM

## 2022-12-29 LAB — URINE CULTURE
MICRO NUMBER:: 14519136
SPECIMEN QUALITY:: ADEQUATE

## 2022-12-29 MED ORDER — ROSUVASTATIN CALCIUM 20 MG PO TABS: 20.0000 mg | ORAL_TABLET | Freq: Every day | ORAL | 3 refills | Status: DC

## 2022-12-29 NOTE — Progress Notes (Signed)
Hga1c is stable from last year, still prediabetic range. Keep on working on diabetic diet and exercise as tolerated.   Your cholesterol is pretty high, this is not anywhere near goal which is <100. We need to strongly work on compliance and taking once a night. I am going to increase crestor to 20 mg and try to take nightly. Repeat lab only appt in three months come fasting.

## 2023-01-01 ENCOUNTER — Ambulatory Visit: Payer: 59 | Admitting: Family

## 2023-01-04 ENCOUNTER — Other Ambulatory Visit: Payer: Self-pay | Admitting: Family

## 2023-01-04 DIAGNOSIS — E119 Type 2 diabetes mellitus without complications: Secondary | ICD-10-CM

## 2023-01-07 ENCOUNTER — Telehealth: Payer: Self-pay | Admitting: Family

## 2023-01-07 ENCOUNTER — Ambulatory Visit: Payer: 59 | Admitting: Family

## 2023-01-07 ENCOUNTER — Encounter: Payer: Self-pay | Admitting: Family

## 2023-01-07 VITALS — BP 120/84 | HR 92 | Temp 97.6°F | Wt 212.2 lb

## 2023-01-07 DIAGNOSIS — E6609 Other obesity due to excess calories: Secondary | ICD-10-CM | POA: Insufficient documentation

## 2023-01-07 DIAGNOSIS — E1169 Type 2 diabetes mellitus with other specified complication: Secondary | ICD-10-CM | POA: Diagnosis not present

## 2023-01-07 DIAGNOSIS — E119 Type 2 diabetes mellitus without complications: Secondary | ICD-10-CM

## 2023-01-07 DIAGNOSIS — Z6838 Body mass index (BMI) 38.0-38.9, adult: Secondary | ICD-10-CM

## 2023-01-07 DIAGNOSIS — H60551 Acute reactive otitis externa, right ear: Secondary | ICD-10-CM | POA: Diagnosis not present

## 2023-01-07 DIAGNOSIS — E785 Hyperlipidemia, unspecified: Secondary | ICD-10-CM

## 2023-01-07 LAB — MICROALBUMIN / CREATININE URINE RATIO
Creatinine,U: 114.3 mg/dL
Microalb Creat Ratio: 0.6 mg/g (ref 0.0–30.0)
Microalb, Ur: <0.7 mg/dL (ref 0.0–1.9)

## 2023-01-07 MED ORDER — ROSUVASTATIN CALCIUM 10 MG PO TABS: 10.0000 mg | ORAL_TABLET | Freq: Every day | ORAL | 3 refills | Status: DC

## 2023-01-07 MED ORDER — CIPROFLOXACIN-DEXAMETHASONE 0.3-0.1 % OT SUSP: 4.0000 [drp] | Freq: Two times a day (BID) | OTIC | 0 refills | Status: DC

## 2023-01-07 NOTE — Progress Notes (Signed)
Established Patient Office Visit  Subjective:   Patient ID: Cheryl [name], female    DOB: 30-Mar-1976  Age: 47 y.o. MRN: [id number]  CC:  Chief Complaint  Patient presents with   Ear Pain    Right side    HPI: Cheryl [name] is a 47 y.o. female presenting today for right sided ear pain.   Ear pain started last night. Describes pain as very painful  to touch. Rates pain as a 6/10. Denies any drainage from ear. Reports that ear does itch and that it feels "wet, like there is water inside". Reports experiencing this same feeling before about a year ago. Denies fever, and chills. Denis sinus pressure, congestion, rhinorrhea, and cough.    Chronic Issues:  DM2: has been taken Ozempic in about a month due to it not being available at her pharmacy. [name] call local pharmacies to see if any one has it available.   HLD: currently taking Crestor 30m daily. Was prescribed increased dose of 183mbut has not started taking increased dose.    ROS: Negative unless specifically indicated above in HPI.   Relevant past medical history reviewed and updated as indicated.   Allergies and medications reviewed and updated.   Current Outpatient Medications:    ciprofloxacin-dexamethasone (CIPRODEX) OTIC suspension, Place 4 drops into the right ear 2 (two) times daily for 7 days., Disp: 2.8 mL, Rfl: 0   OZEMPIC, 1 MG/DOSE, 4 MG/3ML SOPN, INJECT 1 MG AS DIRECTED ONCE A WEEK., Disp: 3 mL, Rfl: 2   rosuvastatin (CRESTOR) 10 MG tablet, Take 1 tablet (10 mg total) by mouth daily., Disp: 90 tablet, Rfl: 3  Allergies  Allergen Reactions   Adhesive [Tape] Other (See Comments)    Band Aids causes Skin blisters.  All other tape is okay to use    Objective:   BP 120/84   Pulse 92   Temp 97.6 F (36.4 C) (Temporal)   Wt 212 lb 3.2 oz (96.3 kg)   LMP 04/02/2015 (Approximate) Comment: after chemo - no periods  SpO2 98%   BMI 38.81 kg/m    Physical Exam Vitals reviewed.   Constitutional:      Appearance: Normal appearance.  HENT:     Right Ear: Ear canal normal. Drainage (scant clear drainage), swelling and tenderness present.     Left Ear: Tympanic membrane, ear canal and external ear normal.     Ears:     Comments: Right auricle with slight erythema.    Nose: Nose normal.     Mouth/Throat:     Mouth: Mucous membranes are dry.     Pharynx: Oropharynx is clear. No pharyngeal swelling or posterior oropharyngeal erythema.  Cardiovascular:     Rate and Rhythm: Normal rate and regular rhythm.  Pulmonary:     Effort: Pulmonary effort is normal.     Breath sounds: Normal breath sounds.  Neurological:     General: No focal deficit present.     Mental Status: [name] alert and oriented to person, place, and time.  Psychiatric:        Mood and Affect: Mood normal.        Behavior: Behavior normal.        Thought Content: Thought content normal.        Judgment: Judgment normal.     Assessment & Plan:  Diabetes mellitus without [facility]: Advised pt to call pharmacy to let me know if and where to send ozempic  and then continue as prescribed.  Urine microalbumin today.  Orders: -     Microalbumin / creatinine urine ratio  Acute reactive otitis externa of right ear Assessment & Plan: Take ear drops as prescribed. Increase oral fluids. Pt to f/u if sx worsen and or fail to improve in 2-3 days. Warm compresses to ear advised.  Orders: -     Ciprofloxacin-dexAMETHasone; Place 4 drops into the right ear 2 (two) times daily for 7 days.  Dispense: 2.8 mL; Refill: 0  Hyperlipidemia associated with type 2 diabetes mellitus ([place]) Assessment & Plan: Refill increase sent for crestor 10 mg  Discussed compliance  Pt advised to:  Work on low cholesterol diet and exercise as tolerated   Orders: -     Rosuvastatin Calcium; Take 1 tablet (10 mg total) by mouth daily.  Dispense: 90 tablet; Refill: 3  Class 2 severe obesity due to  excess calories with serious comorbidity and body mass index (BMI) of 38.0 to 38.9 in adult [facility])     Follow up plan: Return in about 3 months (around 04/07/2023) for as scheduled .  Johny Shock, RN AGNP-student

## 2023-01-07 NOTE — Assessment & Plan Note (Addendum)
Refill increase sent for crestor 10 mg  Discussed compliance  Pt advised to:  Work on low cholesterol diet and exercise as tolerated  I evaluated the patient,  was consulted regarding plans for treatment of care, and agree with the assessment and plan per Joya Gaskins, RN, DNP student.  Eugenia Pancoast, FNP-C

## 2023-01-07 NOTE — Telephone Encounter (Signed)
Advised patient while in the office that the pharmacy is out of stock and would need for her to reach out to them to transfer this medication to another pharmacy. Patient understood.

## 2023-01-07 NOTE — Assessment & Plan Note (Signed)
Take ear drops as prescribed. Increase oral fluids. Pt to f/u if sx worsen and or fail to improve in 2-3 days. Warm compresses to ear advised.

## 2023-01-07 NOTE — Progress Notes (Signed)
Established Patient Office Visit  Subjective:   Patient ID: Cheryl [name], female    DOB: Dec 09, 1975  Age: 47 y.o. MRN: [id number]  CC:  Chief Complaint  Patient presents with   Ear Pain    Right side    HPI: Cheryl [name] is a 47 y.o. female presenting on 01/07/2023 for Ear Pain (Right side)  HPI  Last night noticed right sided ear pain, pain on the outside of the ear. Only slight in the ear canal. No discharge from the ear canal but feels moist/full inside. No fever or chills. No nasal congestion, no sore throat, no cough.   DM2: has been out of ozempic for about one month, can not find at pharmacy. [name] going to call around to see if this is available locally.   HLD: did not yet pick up increased dose crestor, was supposed to be on 10 mg prior however did not increase. And still was taking 5 mg.       ROS: Negative unless specifically indicated above in HPI.   Relevant past medical history reviewed and updated as indicated.   Allergies and medications reviewed and updated.   Current Outpatient Medications:    ciprofloxacin-dexamethasone (CIPRODEX) OTIC suspension, Place 4 drops into the right ear 2 (two) times daily for 7 days., Disp: 2.8 mL, Rfl: 0   OZEMPIC, 1 MG/DOSE, 4 MG/3ML SOPN, INJECT 1 MG AS DIRECTED ONCE A WEEK., Disp: 3 mL, Rfl: 2   rosuvastatin (CRESTOR) 10 MG tablet, Take 1 tablet (10 mg total) by mouth daily., Disp: 90 tablet, Rfl: 3  Allergies  Allergen Reactions   Adhesive [Tape] Other (See Comments)    Band Aids causes Skin blisters.  All other tape is okay to use    Objective:   BP 120/84   Pulse 92   Temp 97.6 F (36.4 C) (Temporal)   Wt 212 lb 3.2 oz (96.3 kg)   LMP 04/02/2015 (Approximate) Comment: after chemo - no periods  SpO2 98%   BMI 38.81 kg/m    Physical Exam Constitutional:      General: [name] not in acute distress.    Appearance: Normal appearance. [name] obese. [name] not ill-appearing,  toxic-appearing or diaphoretic.  HENT:     Head: Normocephalic.     Right Ear: Tympanic membrane normal.     Left Ear: Tympanic membrane normal.     Ears:     Comments: Auricle tenderness with slight swelling on right side  Ear canal with slight erythema no real discharge except for slight clear drainage     Nose: Nose normal.     Mouth/Throat:     Mouth: Mucous membranes are dry.     Pharynx: No oropharyngeal exudate or posterior oropharyngeal erythema.  Eyes:     Extraocular Movements: Extraocular movements intact.     Pupils: Pupils are equal, round, and reactive to light.  Cardiovascular:     Rate and Rhythm: Normal rate and regular rhythm.     Pulses: Normal pulses.     Heart sounds: Normal heart sounds.  Pulmonary:     Effort: Pulmonary effort is normal.     Breath sounds: Normal breath sounds.  Musculoskeletal:     Cervical back: Normal range of motion.  Neurological:     General: No focal deficit present.     Mental Status: [name] alert and oriented to person, place, and time. Mental status is at baseline.  Psychiatric:  Mood and Affect: Mood normal.        Behavior: Behavior normal.        Thought Content: Thought content normal.        Judgment: Judgment normal.     Assessment & Plan:  Diabetes mellitus without complication [facility]) Assessment & Plan: Advised pt to call pharmacy to let me know if and where to send ozempic and then continue as prescribed.  Urine microalbumin today.  Orders: -     Microalbumin / creatinine urine ratio  Acute reactive otitis externa of right ear Assessment & Plan: Take ear drops as prescribed. Increase oral fluids. Pt to f/u if sx worsen and or fail to improve in 2-3 days. Warm compresses to ear advised.  Orders: -     Ciprofloxacin-dexAMETHasone; Place 4 drops into the right ear 2 (two) times daily for 7 days.  Dispense: 2.8 mL; Refill: 0  Hyperlipidemia associated with type 2 diabetes mellitus ([place]) Assessment &  Plan: Refill increase sent for crestor 10 mg  Discussed compliance  Pt advised to:  Work on low cholesterol diet and exercise as tolerated  I evaluated the patient,  was consulted regarding plans for treatment of care, and agree with the assessment and plan per Joya Gaskins, RN, DNP student.  Eugenia Pancoast, FNP-C    Orders: -     Rosuvastatin Calcium; Take 1 tablet (10 mg total) by mouth daily.  Dispense: 90 tablet; Refill: 3  Class 2 severe obesity due to excess calories with serious comorbidity and body mass index (BMI) of 38.0 to 38.9 in adult [facility])     Follow up plan: Return in about 3 months (around 04/07/2023) for as scheduled .  Eugenia Pancoast, FNP

## 2023-01-07 NOTE — Assessment & Plan Note (Signed)
Advised pt to call pharmacy to let me know if and where to send ozempic and then continue as prescribed.  Urine microalbumin today.

## 2023-01-07 NOTE — Telephone Encounter (Signed)
Patient was prescribedciprofloxacin-dexamethasone (CIPRODEX) OTIC suspension  ,she was advised to call if they were too expensive,and something else would be called in for her. She said they are running over 100 dollars,and would like something else called in.

## 2023-01-08 NOTE — Telephone Encounter (Signed)
Ear drops are too expensive for the patient. Please advise options. Thanks

## 2023-01-11 MED ORDER — OFLOXACIN 0.3 % OT SOLN: 5.0000 [drp] | Freq: Every day | OTIC | 0 refills | Status: AC

## 2023-01-11 NOTE — Telephone Encounter (Signed)
Responded via mychart

## 2023-01-11 NOTE — Addendum Note (Signed)
Addended by: Eugenia Pancoast on: 01/11/2023 09:54 AM   Modules accepted: Orders

## 2023-04-01 ENCOUNTER — Other Ambulatory Visit (INDEPENDENT_AMBULATORY_CARE_PROVIDER_SITE_OTHER): Payer: 59

## 2023-04-01 DIAGNOSIS — E1169 Type 2 diabetes mellitus with other specified complication: Secondary | ICD-10-CM

## 2023-04-01 DIAGNOSIS — E785 Hyperlipidemia, unspecified: Secondary | ICD-10-CM

## 2023-04-01 LAB — LIPID PANEL
Cholesterol: 222 mg/dL — ABNORMAL HIGH (ref 0–200)
HDL: 53.5 mg/dL (ref >39.00)
LDL Cholesterol: 139 mg/dL — ABNORMAL HIGH (ref 0–99)
NonHDL: 168.49
Total CHOL/HDL Ratio: 4
Triglycerides: 147 mg/dL (ref 0.0–149.0)
VLDL: 29.4 mg/dL (ref 0.0–40.0)

## 2023-05-01 ENCOUNTER — Other Ambulatory Visit: Payer: Self-pay | Admitting: Family

## 2023-05-01 DIAGNOSIS — E119 Type 2 diabetes mellitus without complications: Secondary | ICD-10-CM

## 2023-06-23 ENCOUNTER — Telehealth: Payer: Self-pay

## 2023-06-23 ENCOUNTER — Encounter: Payer: Self-pay | Admitting: Oncology

## 2023-06-23 ENCOUNTER — Other Ambulatory Visit (HOSPITAL_COMMUNITY): Payer: Self-pay

## 2023-06-23 NOTE — Telephone Encounter (Signed)
Pharmacy Patient Advocate Encounter   Received notification from CoverMyMeds that prior authorization for Ozempic (0.25 or 0.5 MG/DOSE) 2MG /3ML pen-injectors is required/requested.   Insurance verification completed.   The patient is insured through [facility] .   Per test claim: PA required; PA submitted to [facility] via CoverMyMeds Key/confirmation #/EOC [facility] Status is pending   *Renewal

## 2023-06-24 ENCOUNTER — Other Ambulatory Visit (HOSPITAL_COMMUNITY): Payer: Self-pay

## 2023-06-24 NOTE — Telephone Encounter (Signed)
Pharmacy Patient Advocate Encounter  Received notification from [facility] that Prior Authorization for Ozempic (0.25 or 0.5 MG/DOSE) 2MG /3ML pen-injectors has been APPROVED from 06/23/23 to 06/22/24. Ran test claim, Copay is $--- (Refill too soon, next fill 07/01/23)  PA #/Case ID/Reference #: [id number]

## 2023-07-02 ENCOUNTER — Ambulatory Visit: Payer: 59 | Admitting: Family

## 2023-07-02 ENCOUNTER — Telehealth: Payer: Self-pay | Admitting: Hematology and Oncology

## 2023-07-02 ENCOUNTER — Encounter: Payer: Self-pay | Admitting: Family

## 2023-07-02 VITALS — BP 122/84 | HR 90 | Temp 97.6°F | Ht 62.0 in | Wt 209.1 lb

## 2023-07-02 DIAGNOSIS — Z1501 Genetic susceptibility to malignant neoplasm of breast: Secondary | ICD-10-CM

## 2023-07-02 DIAGNOSIS — Z6838 Body mass index (BMI) 38.0-38.9, adult: Secondary | ICD-10-CM

## 2023-07-02 DIAGNOSIS — Z0000 Encounter for general adult medical examination without abnormal findings: Secondary | ICD-10-CM

## 2023-07-02 DIAGNOSIS — Z1509 Genetic susceptibility to other malignant neoplasm: Secondary | ICD-10-CM

## 2023-07-02 DIAGNOSIS — E785 Hyperlipidemia, unspecified: Secondary | ICD-10-CM

## 2023-07-02 DIAGNOSIS — E559 Vitamin D deficiency, unspecified: Secondary | ICD-10-CM

## 2023-07-02 DIAGNOSIS — E1169 Type 2 diabetes mellitus with other specified complication: Secondary | ICD-10-CM | POA: Diagnosis not present

## 2023-07-02 DIAGNOSIS — Z7985 Long-term (current) use of injectable non-insulin antidiabetic drugs: Secondary | ICD-10-CM

## 2023-07-02 DIAGNOSIS — E66812 Obesity, class 2: Secondary | ICD-10-CM

## 2023-07-02 DIAGNOSIS — E538 Deficiency of other specified B group vitamins: Secondary | ICD-10-CM

## 2023-07-02 DIAGNOSIS — E119 Type 2 diabetes mellitus without complications: Secondary | ICD-10-CM | POA: Diagnosis not present

## 2023-07-02 DIAGNOSIS — Z15068 Genetic susceptibility to other malignant neoplasm of digestive system: Secondary | ICD-10-CM

## 2023-07-02 LAB — POCT GLYCOSYLATED HEMOGLOBIN (HGB A1C): Hemoglobin A1C: 5.8 % — AB (ref 4.0–5.6)

## 2023-07-02 NOTE — Patient Instructions (Signed)
  Cal your oncologist to get an appointment to follow up as overdue.    Regards,   Mort Sawyers FNP-C

## 2023-07-02 NOTE — Progress Notes (Unsigned)
Established Patient Office Visit  Subjective:      CC:  Chief Complaint  Patient presents with   Annual Exam    HPI: Cheryl [name] is a 47 y.o. female presenting on 07/02/2023 for Annual Exam . DM2: ozempic 1 mg tolerating well.  Lab Results  Component Value Date   HGBA1C 5.8 (A) 07/02/2023   Wt Readings from Last 3 Encounters:  07/02/23 209 lb 2 oz (94.9 kg)  01/07/23 212 lb 3.2 oz (96.3 kg)  12/28/22 208 lb 9.6 oz (94.6 kg)   Hyperlipidemia: taking crestor 10 mg , not taking regularly. Tolerating well.   Lab Results  Component Value Date   CHOL 222 (H) 04/01/2023   HDL 53.50 04/01/2023   LDLCALC 139 (H) 04/01/2023   LDLDIRECT 168.0 12/28/2022   TRIG 147.0 04/01/2023   CHOLHDL 4 04/01/2023   Utd dental and eye exams  No advanced directive or living will.  No known h/o STD  Exercise: walks a few times a week, very active at her jobs.  Diet: trying to work on diabetic diet  Colonoscopy, 07/04/21 Mammogram: had bil mastectomy no need to have these completed.  Pap: no longer needed, complete hysterectomy    Social history:  Relevant past medical, surgical, family and social history reviewed and updated as indicated. Interim medical history since our last visit reviewed.  Allergies and medications reviewed and updated.  DATA REVIEWED: CHART IN EPIC     ROS: Negative unless specifically indicated above in HPI.    Current Outpatient Medications:    rosuvastatin (CRESTOR) 10 MG tablet, Take 1 tablet (10 mg total) by mouth daily., Disp: 90 tablet, Rfl: 3   Semaglutide, 1 MG/DOSE, (OZEMPIC, 1 MG/DOSE,) 4 MG/3ML SOPN, INEJCT 1 MG INTO THE SKIN ONCE A WEEK, Disp: 9 mL, Rfl: 0      Objective:    BP 122/84   Pulse 90   Temp 97.6 F (36.4 C) (Temporal)   Ht 5\' 2"  (1.575 m)   Wt 209 lb 2 oz (94.9 kg)   LMP 04/02/2015 (Approximate) Comment: after chemo - no periods  SpO2 98%   BMI 38.25 kg/m   Wt Readings from Last 3 Encounters:  07/02/23  209 lb 2 oz (94.9 kg)  01/07/23 212 lb 3.2 oz (96.3 kg)  12/28/22 208 lb 9.6 oz (94.6 kg)    Physical Exam Constitutional:      General: [name] not in acute distress.    Appearance: Normal appearance. [name] obese. [name] not ill-appearing.  HENT:     Head: Normocephalic.     Right Ear: Tympanic membrane normal.     Left Ear: Tympanic membrane normal.     Nose: Nose normal.     Mouth/Throat:     Mouth: Mucous membranes are moist.  Eyes:     Extraocular Movements: Extraocular movements intact.     Pupils: Pupils are equal, round, and reactive to light.  Cardiovascular:     Rate and Rhythm: Normal rate and regular rhythm.  Pulmonary:     Effort: Pulmonary effort is normal.     Breath sounds: Normal breath sounds.  Abdominal:     General: Abdomen is flat. Bowel sounds are normal.     Palpations: Abdomen is soft.     Tenderness: There is no guarding or rebound.  Musculoskeletal:        General: Normal range of motion.     Cervical back: Normal range of motion.  Skin:  General: Skin is warm.     Capillary Refill: Capillary refill takes less than 2 seconds.  Neurological:     General: No focal deficit present.     Mental Status: [name] alert.  Psychiatric:        Mood and Affect: Mood normal.        Behavior: Behavior normal.        Thought Content: Thought content normal.        Judgment: Judgment normal.         Diabetic Foot Form - Detailed   Diabetic Foot Exam - detailed Can the patient see the bottom of their feet?: Yes Are the shoes appropriate in style and fit?: Yes Is there swelling or and abnormal foot shape?: No Is there a claw toe deformity?: No Is there elevated skin temparature?: No Is there foot or ankle muscle weakness?: No Normal Range of Motion: Yes Pulse Foot Exam completed.: Yes   Right posterior Tibialias: Present Left posterior Tibialias: Present   Right Dorsalis Pedis: Present Left Dorsalis Pedis: Present  Semmes-Weinstein Monofilament  Test R Site 1-Great Toe: Pos L Site 1-Great Toe: Pos           Lab Results  Component Value Date   HGBA1C 5.8 (A) 07/02/2023    Assessment & Plan:  Diabetes mellitus without complication (HCC) Assessment & Plan: Doing well on ozempic 1 mg once weekly.  A1c today at goal, diabetic diet and exercise as tolerated. Yearly foot exam, and annual eye exam.    Orders: -     POCT glycosylated hemoglobin (Hb A1C) -     Comprehensive metabolic panel; Future  BRCA positive  Hyperlipidemia associated with type 2 diabetes mellitus (HCC) Assessment & Plan: Continue crestor 10 mg nightly. D/w pt on compliance.  Ordered lipid panel, pending results. Work on low cholesterol diet and exercise as tolerated   Orders: -     Lipid panel; Future  Vitamin D deficiency -     VITAMIN D 25 Hydroxy (Vit-D Deficiency, Fractures); Future  Vitamin B12 deficiency -     CBC; Future  Encounter for general adult medical examination without abnormal findings Assessment & Plan: Patient Counseling(The following topics were reviewed):  Preventative care handout given to pt  Health maintenance and immunizations reviewed. Please refer to Health maintenance section. Pt advised on safe sex, wearing seatbelts in car, and proper nutrition labwork ordered today for annual Dental health: Discussed importance of regular tooth brushing, flossing, and dental visits.    Class 2 severe obesity due to excess calories with serious comorbidity and body mass index (BMI) of 38.0 to 38.9 in adult [facility]) Assessment & Plan: Pt advised to work on diet and exercise as tolerated       Return in about 6 months (around 01/02/2024) for f/u diabetes.  Mort Sawyers, MSN, APRN, FNP-C [facility]

## 2023-07-05 ENCOUNTER — Other Ambulatory Visit: Payer: Self-pay

## 2023-07-05 DIAGNOSIS — Z171 Estrogen receptor negative status [ER-]: Secondary | ICD-10-CM

## 2023-07-05 DIAGNOSIS — Z0000 Encounter for general adult medical examination without abnormal findings: Secondary | ICD-10-CM | POA: Insufficient documentation

## 2023-07-05 NOTE — Assessment & Plan Note (Signed)

## 2023-07-05 NOTE — Assessment & Plan Note (Signed)
Pt advised to work on diet and exercise as tolerated  

## 2023-07-05 NOTE — Assessment & Plan Note (Addendum)
Doing well on ozempic 1 mg once weekly.  A1c today at goal, diabetic diet and exercise as tolerated. Yearly foot exam, and annual eye exam.

## 2023-07-05 NOTE — Assessment & Plan Note (Signed)
Continue crestor 10 mg nightly. D/w pt on compliance.  Ordered lipid panel, pending results. Work on low cholesterol diet and exercise as tolerated

## 2023-07-06 ENCOUNTER — Inpatient Hospital Stay: Payer: 59

## 2023-07-06 ENCOUNTER — Inpatient Hospital Stay: Payer: 59 | Attending: Hematology and Oncology | Admitting: Hematology and Oncology

## 2023-07-06 ENCOUNTER — Other Ambulatory Visit: Payer: Self-pay

## 2023-07-06 VITALS — BP 138/96 | HR 92 | Temp 98.1°F | Resp 17 | Wt 208.9 lb

## 2023-07-06 DIAGNOSIS — Z923 Personal history of irradiation: Secondary | ICD-10-CM | POA: Diagnosis not present

## 2023-07-06 DIAGNOSIS — Z171 Estrogen receptor negative status [ER-]: Secondary | ICD-10-CM

## 2023-07-06 DIAGNOSIS — Z853 Personal history of malignant neoplasm of breast: Secondary | ICD-10-CM | POA: Insufficient documentation

## 2023-07-06 DIAGNOSIS — Z9221 Personal history of antineoplastic chemotherapy: Secondary | ICD-10-CM | POA: Diagnosis not present

## 2023-07-06 DIAGNOSIS — Z9013 Acquired absence of bilateral breasts and nipples: Secondary | ICD-10-CM | POA: Diagnosis not present

## 2023-07-06 DIAGNOSIS — C50412 Malignant neoplasm of upper-outer quadrant of left female breast: Secondary | ICD-10-CM | POA: Diagnosis not present

## 2023-07-06 LAB — CMP (CANCER CENTER ONLY)
ALT: 15 U/L (ref 0–44)
AST: 16 U/L (ref 15–41)
Albumin: 4.2 g/dL (ref 3.5–5.0)
Alkaline Phosphatase: 80 U/L (ref 38–126)
Anion gap: 8 (ref 5–15)
BUN: 15 mg/dL (ref 6–20)
CO2: 27 mmol/L (ref 22–32)
Calcium: 9.1 mg/dL (ref 8.9–10.3)
Chloride: 104 mmol/L (ref 98–111)
Creatinine: 0.85 mg/dL (ref 0.44–1.00)
GFR, Estimated: >60 mL/min (ref >60)
Glucose, Bld: 132 mg/dL — ABNORMAL HIGH (ref 70–99)
Potassium: 4.4 mmol/L (ref 3.5–5.1)
Sodium: 139 mmol/L (ref 135–145)
Total Bilirubin: 1 mg/dL (ref 0.3–1.2)
Total Protein: 7.4 g/dL (ref 6.5–8.1)

## 2023-07-06 LAB — CBC WITH DIFFERENTIAL (CANCER CENTER ONLY)
Abs Immature Granulocytes: 0.02 10*3/uL (ref 0.00–0.07)
Basophils Absolute: 0 10*3/uL (ref 0.0–0.1)
Basophils Relative: 0 %
Eosinophils Absolute: 0.1 10*3/uL (ref 0.0–0.5)
Eosinophils Relative: 2 %
HCT: 38.2 % (ref 36.0–46.0)
Hemoglobin: 12.8 g/dL (ref 12.0–15.0)
Immature Granulocytes: 0 %
Lymphocytes Relative: 32 %
Lymphs Abs: 2.4 10*3/uL (ref 0.7–4.0)
MCH: 29.9 pg (ref 26.0–34.0)
MCHC: 33.5 g/dL (ref 30.0–36.0)
MCV: 89.3 fL (ref 80.0–100.0)
Monocytes Absolute: 0.4 10*3/uL (ref 0.1–1.0)
Monocytes Relative: 5 %
Neutro Abs: 4.7 10*3/uL (ref 1.7–7.7)
Neutrophils Relative %: 61 %
Platelet Count: 346 10*3/uL (ref 150–400)
RBC: 4.28 MIL/uL (ref 3.87–5.11)
RDW: 12.7 % (ref 11.5–15.5)
WBC Count: 7.7 10*3/uL (ref 4.0–10.5)
nRBC: 0 % (ref 0.0–0.2)

## 2023-07-06 NOTE — Assessment & Plan Note (Addendum)
02/08/2015: Left breast grade 2 DCIS UOQ ER/PR positive with likely area of microinvasion, genetics negative 03/27/2015: Left lumpectomy: T1c N0 stage Ia grade 3 IDC triple negative Ki-67 33%, margin reexcision clear, Oncotype 38 (26% risk of recurrence) 05/02/2015 Adriamycin and Cytoxan x 4 followed by Taxol weekly x 12 09/2015: Completed radiation 02/20/2015: Neoadjuvant tamoxifen, resumed 02/07/2016 discontinued April 2018 for recurrence April 2018: Left breast biopsy grade 2 IDC triple negative 04/20/2017: Bilateral mastectomies T2N00/8 lymph nodes negative grade 3 IDC margins negative 05/13/2017: Adjuvant CMF x 7 ------------------------------------------------------------------------------------------------------------------------------------- Breast cancer surveillance: Chest wall exam 07/06/2023: Benign No role of imaging since she had bilateral mastectomies.  [name] working a couple of jobs and and her daughter is thinking of joining [employer].  I offered the patient annual follow-ups with long-term survivorship clinic versus following up with [place] on an as-needed basis.  Patient decided to see [place] if necessary.  Since she is now established with me she can call us anytime if necessary.

## 2023-07-06 NOTE — Progress Notes (Signed)
Patient Care Team: Mort Sawyers, FNP as PCP - General (Family Medicine) Huel Cote, MD as Consulting Physician (Obstetrics and Gynecology) Glenna Fellows, MD (Inactive) as Consulting Physician (General Surgery) Magrinat, Valentino Hue, MD (Inactive) as Consulting Physician (Oncology) Dillingham, Alena Bills, DO as Attending Physician (Plastic Surgery)  DIAGNOSIS:  Encounter Diagnosis  Name Primary?   Malignant neoplasm of upper-outer quadrant of left breast in female, estrogen receptor negative (HCC) Yes    SUMMARY OF ONCOLOGIC HISTORY: Oncology History  Malignant neoplasm of upper-outer quadrant of left breast in female, estrogen receptor negative (HCC)  02/08/2015 Mammogram   Left breast: large group of pleomorphic calcifications, many of which appear linear and branching at the approximate 1 to 2 o'clock position. The spanning a distance of 4.2 x 2.6 cm.   02/08/2015 Breast US   No discrete masses are seen in the upper-outer left breast although there is vague shadowing present at the approximate 2 o'clock position. Calcifications cannot be clearly identified sonographically.   02/08/2015 Initial Biopsy   Left breast core needle bx (UOQ): at least DCIS, grade 3, ER+ (100%), PR+ (100%)   02/15/2015 Breast MRI   Upper-outer left breast measures up to 4.5 cm, which includes the large area of non mass enhancement, 1 cm spiculated mass, and associated hematoma;Indeterminate oval mass in the central to slightly medial left;Indeterminate mass in the slightly outer rig   02/18/2015 Procedure   Right breast core needle bx (9:00): fibroadenoma; Left breast core needle bx (10:00): fibroadenoma; Left breast core needle bx (2:00): pseudoangiomatous stromal hyperplasia (PASH)   02/18/2015 Clinical Stage   Stage 0: Tis N0   02/20/2015 -  Neo-Adjuvant Anti-estrogen oral therapy   Tamoxifen 20 mg daily held during chemotherapy and radiation   02/21/2015 Procedure   BreastNext gene panel  offered by [employer] showed no clinically significant variants at ATM, BARD1, BRCA1, BRCA2, BRIP1, CDH1, CHEK2, MRE11A, MUTYH, NBN, NF1, PALB2, PTEN, RAD50, RAD51C, RAD51D, and TP53.   03/27/2015 Definitive Surgery   Left lumpectomy/SLNB: IDC, grade 3, spanning 1.3 cm, ER- (0%), PR- (0%), HER2/neu negative (ratio 1.61), Ki67 33%; extensive DCIS (ER/PR+) with comedonecrosis and calcs; extremely close margins; 4 LN removed and negative for malignancy (0/4)   03/27/2015 Pathologic Stage   Stage IA: pT1c pN0   03/27/2015 Oncotype testing   RS 38; ROR 26%   04/05/2015 Surgery   Re-ecision of margins: no evidence of malignancy   05/02/2015 - 07/11/2015 Chemotherapy   Dose dense doxorubicin and cyclophosphamide x 4 followed by weekly paclitaxel x 12, stopped after 2 cycles due to CIPN   08/19/2015 - 10/02/2015 Radiation Therapy   Left breast/ 45 Gy at 1.8 Gy per fraction x 25 fractions.   Left breast boost/ 16 Gy at 2 Gy per fraction x 8 fractions   01/13/2016 Survivorship   Survivorship care plan completed and mailed to patient in lieu of in person visit     CHIEF COMPLIANT: surveillance/Establish oncology care with Dr. Pamelia Hoit   INTERVAL HISTORY: Cheryl [name] is a 47 y.o patient with breast cancer. Currently on surveillance. She presents to the clinic for a follow-up. Patient reports that she has been doing well. She has no complaints to report to the clinic. She works a full time and part time job. Denies having any neuropathy. She has trouble remembering things, it could be due to age.    ALLERGIES:  is allergic to adhesive [tape].  MEDICATIONS:  Current Outpatient Medications  Medication Sig Dispense Refill  rosuvastatin (CRESTOR) 10 MG tablet Take 1 tablet (10 mg total) by mouth daily. 90 tablet 3   Semaglutide, 1 MG/DOSE, (OZEMPIC, 1 MG/DOSE,) 4 MG/3ML SOPN INEJCT 1 MG INTO THE SKIN ONCE A WEEK 9 mL 0   No current facility-administered medications for this visit.     PHYSICAL EXAMINATION: ECOG PERFORMANCE STATUS: 0 - Asymptomatic  Vitals:   07/06/23 0836  BP: (!) 138/96  Pulse: 92  Resp: 17  Temp: 98.1 F (36.7 C)  SpO2: 100%   Filed Weights   07/06/23 0836  Weight: 208 lb 14.4 oz (94.8 kg)    BREAST: No palpable masses or nodules in either right or left reconstructed breasts. No palpable axillary supraclavicular or infraclavicular adenopathy . (exam performed in the presence of a chaperone)  LABORATORY DATA:  I have reviewed the data as listed    Latest Ref Rng & Units 06/05/2022    9:38 AM 02/03/2022    8:37 AM 01/15/2022    3:42 PM  CMP  Glucose 70 - 99 mg/dL 657  846  962   BUN 6 - 23 mg/dL 8  14  17    Creatinine 0.40 - 1.20 mg/dL 9.52  8.41  3.24   Sodium 135 - 145 mEq/L 138  139  140   Potassium 3.5 - 5.1 mEq/L 4.2  4.0  3.7   Chloride 96 - 112 mEq/L 102  101  103   CO2 19 - 32 mEq/L 28  20  26    Calcium 8.4 - 10.5 mg/dL 9.4  40.1  9.6   Total Protein 6.0 - 8.3 g/dL 7.4  7.3  6.6   Total Bilirubin 0.2 - 1.2 mg/dL 0.8  0.9  0.8   Alkaline Phos 39 - 117 U/L 69  96  84   AST 0 - 37 U/L 20  33  34   ALT 0 - 35 U/L 23  42  43     Lab Results  Component Value Date   WBC 7.7 07/06/2023   HGB 12.8 07/06/2023   HCT 38.2 07/06/2023   MCV 89.3 07/06/2023   PLT 346 07/06/2023   NEUTROABS 4.7 07/06/2023    ASSESSMENT & PLAN:  Malignant neoplasm of upper-outer quadrant of left breast in female, estrogen receptor negative (HCC) 02/08/2015: Left breast grade 2 DCIS UOQ ER/PR positive with likely area of microinvasion, genetics negative 03/27/2015: Left lumpectomy: T1c N0 stage Ia grade 3 IDC triple negative Ki-67 33%, margin reexcision clear, Oncotype 38 (26% risk of recurrence) 05/02/2015 Adriamycin and Cytoxan x 4 followed by Taxol weekly x 12 09/2015: Completed radiation 02/20/2015: Neoadjuvant tamoxifen, resumed 02/07/2016 discontinued April 2018 for recurrence April 2018: Left breast biopsy grade 2 IDC triple negative 04/20/2017:  Bilateral mastectomies T2N00/8 lymph nodes negative grade 3 IDC margins negative 05/13/2017: Adjuvant CMF x 7 ------------------------------------------------------------------------------------------------------------------------------------- Breast cancer surveillance: Chest wall exam 07/06/2023: Benign No role of imaging since she had bilateral mastectomies.  [name] working a couple of jobs and and her daughter is thinking of joining [employer].  I offered the patient annual follow-ups with long-term survivorship clinic versus following up with [place] on an as-needed basis.  Patient decided to see [place] if necessary.  Since she is now established with me she can call us anytime if necessary.   No orders of the defined types were placed in this encounter.  The patient has a good understanding of the overall plan. she agrees with it. she will call with any problems that may  develop before the next visit here. Total time spent: 30 mins including face to face time and time spent for planning, charting and co-ordination of care   Tamsen Meek, MD 07/06/23    I Janan Ridge am acting as a Neurosurgeon for [employer]  I have reviewed the above documentation for accuracy and completeness, and I agree with the above.

## 2023-07-14 ENCOUNTER — Encounter: Payer: Self-pay | Admitting: Family

## 2023-07-14 DIAGNOSIS — E1169 Type 2 diabetes mellitus with other specified complication: Secondary | ICD-10-CM

## 2023-07-14 MED ORDER — PRAVASTATIN SODIUM 10 MG PO TABS
10.0000 mg | ORAL_TABLET | Freq: Every day | ORAL | 0 refills | Status: DC
Start: 2023-07-14 — End: 2023-10-19

## 2023-07-14 NOTE — Addendum Note (Signed)
Addended by: Mort Sawyers on: 07/14/2023 02:55 PM   Modules accepted: Orders

## 2023-07-21 ENCOUNTER — Encounter: Payer: Self-pay | Admitting: Family

## 2023-09-08 ENCOUNTER — Encounter: Payer: Self-pay | Admitting: Family Medicine

## 2023-09-08 ENCOUNTER — Ambulatory Visit: Payer: 59 | Admitting: Family Medicine

## 2023-09-08 VITALS — BP 120/84 | HR 87 | Temp 98.8°F | Ht 62.0 in | Wt 214.0 lb

## 2023-09-08 DIAGNOSIS — R109 Unspecified abdominal pain: Secondary | ICD-10-CM

## 2023-09-08 LAB — POC URINALSYSI DIPSTICK (AUTOMATED)
Bilirubin, UA: NEGATIVE
Blood, UA: NEGATIVE
Glucose, UA: NEGATIVE
Ketones, UA: NEGATIVE
Leukocytes, UA: NEGATIVE
Nitrite, UA: NEGATIVE
Protein, UA: NEGATIVE
Spec Grav, UA: 1.015 (ref 1.010–1.025)
Urobilinogen, UA: 0.2 U/dL
pH, UA: 6 (ref 5.0–8.0)

## 2023-09-08 MED ORDER — DICLOFENAC SODIUM 75 MG PO TBEC: 75.0000 mg | DELAYED_RELEASE_TABLET | Freq: Two times a day (BID) | ORAL | 0 refills | Status: DC

## 2023-09-08 MED ORDER — CYCLOBENZAPRINE HCL 10 MG PO TABS: 10.0000 mg | ORAL_TABLET | Freq: Every evening | ORAL | 0 refills | Status: DC | PRN

## 2023-09-08 NOTE — Progress Notes (Signed)
Patient ID: Cheryl [name], female    DOB: 1976-10-21, 47 y.o.   MRN: [id number]  This visit was conducted in person.  BP 120/84 (BP Location: Left Arm, Patient Position: Sitting, Cuff Size: Normal)   Pulse 87   Temp 98.8 F (37.1 C) (Oral)   Ht 5\' 2"  (1.575 m)   Wt 214 lb (97.1 kg)   LMP 04/02/2015 (Approximate) Comment: after chemo - no periods  SpO2 97%   BMI 39.14 kg/m    CC:  Chief Complaint  Patient presents with   Back Pain    On lower right side. Patient thinks she has a UTI.    Subjective:   HPI: Cheryl [name] is a 47 y.o. female presenting on 09/08/2023 for Back Pain (On lower right side. Patient thinks she has a UTI.)   New onset right   mid back pain... sudden onset 2 days ago.   No radiation of pain.  No dysuria, some frequency, and urgency.  NO blood in urine.  No N/V. No Abdominal pain.   Has history of frequent UTI.Marland Kitchen last one > 1 year ago.  No recent antibiotics.    Has 2 kidneys, no recent uro procedure.   Ibuprofen 800 mg  for pain was not helpful. Not drinking more water, no other OTC meds.   No change in activity, no falls.        Relevant past medical, surgical, family and social history reviewed and updated as indicated. Interim medical history since our last visit reviewed. Allergies and medications reviewed and updated. Outpatient Medications Prior to Visit  Medication Sig Dispense Refill   pravastatin (PRAVACHOL) 10 MG tablet Take 1 tablet (10 mg total) by mouth daily. 90 tablet 0   Semaglutide, 1 MG/DOSE, (OZEMPIC, 1 MG/DOSE,) 4 MG/3ML SOPN INEJCT 1 MG INTO THE SKIN ONCE A WEEK 9 mL 0   No facility-administered medications prior to visit.     Per HPI unless specifically indicated in ROS section below Review of Systems  Constitutional:  Negative for fatigue and fever.  HENT:  Negative for congestion.   Eyes:  Negative for pain.  Respiratory:  Negative for cough and shortness of breath.   Cardiovascular:   Negative for chest pain, palpitations and leg swelling.  Gastrointestinal:  Negative for abdominal pain.  Genitourinary:  Positive for frequency and urgency. Negative for dysuria, hematuria and vaginal bleeding.  Musculoskeletal:  Positive for back pain.  Neurological:  Negative for syncope, light-headedness and headaches.  Psychiatric/Behavioral:  Negative for dysphoric mood.    Objective:  BP 120/84 (BP Location: Left Arm, Patient Position: Sitting, Cuff Size: Normal)   Pulse 87   Temp 98.8 F (37.1 C) (Oral)   Ht 5\' 2"  (1.575 m)   Wt 214 lb (97.1 kg)   LMP 04/02/2015 (Approximate) Comment: after chemo - no periods  SpO2 97%   BMI 39.14 kg/m   Wt Readings from Last 3 Encounters:  09/08/23 214 lb (97.1 kg)  07/06/23 208 lb 14.4 oz (94.8 kg)  07/02/23 209 lb 2 oz (94.9 kg)      Physical Exam Constitutional:      General: [name] not in acute distress.    Appearance: Normal appearance. [name] well-developed. [name] not ill-appearing or toxic-appearing.  HENT:     Head: Normocephalic.     Right Ear: Hearing, tympanic membrane, ear canal and external ear normal. Tympanic membrane is not erythematous, retracted or bulging.     Left Ear: Hearing,  tympanic membrane, ear canal and external ear normal. Tympanic membrane is not erythematous, retracted or bulging.     Nose: No mucosal edema or rhinorrhea.     Right Sinus: No maxillary sinus tenderness or frontal sinus tenderness.     Left Sinus: No maxillary sinus tenderness or frontal sinus tenderness.     Mouth/Throat:     Mouth: Oropharynx is clear and moist and mucous membranes are normal.     Pharynx: Uvula midline.  Eyes:     General: Lids are normal. Lids are everted, no foreign bodies appreciated.     Extraocular Movements: EOM normal.     Conjunctiva/sclera: Conjunctivae normal.     Pupils: Pupils are equal, round, and reactive to light.  Neck:     Thyroid: No thyroid mass or thyromegaly.     Vascular: No carotid bruit.      Trachea: Trachea normal.  Cardiovascular:     Rate and Rhythm: Normal rate and regular rhythm.     Pulses: Normal pulses.     Heart sounds: Normal heart sounds, S1 normal and S2 normal. No murmur heard.    No friction rub. No gallop.  Pulmonary:     Effort: Pulmonary effort is normal. No tachypnea or respiratory distress.     Breath sounds: Normal breath sounds. No decreased breath sounds, wheezing, rhonchi or rales.  Abdominal:     General: Bowel sounds are normal.     Palpations: Abdomen is soft.     Tenderness: There is no abdominal tenderness. There is right CVA tenderness. There is no left CVA tenderness.  Musculoskeletal:     Cervical back: Normal range of motion and neck supple.  Skin:    General: Skin is warm, dry and intact.     Findings: No rash.  Neurological:     Mental Status: [name] alert.  Psychiatric:        Mood and Affect: Mood is not anxious or depressed.        Speech: Speech normal.        Behavior: Behavior normal. Behavior is cooperative.        Thought Content: Thought content normal.        Cognition and Memory: Cognition and memory normal.        Judgment: Judgment normal.       Results for orders placed or performed in visit on 07/06/23  CMP (Cancer Center only)  Result Value Ref Range   Sodium 139 135 - 145 mmol/L   Potassium 4.4 3.5 - 5.1 mmol/L   Chloride 104 98 - 111 mmol/L   CO2 27 22 - 32 mmol/L   Glucose, Bld 132 (H) 70 - 99 mg/dL   BUN 15 6 - 20 mg/dL   Creatinine 1.61 0.96 - 1.00 mg/dL   Calcium 9.1 8.9 - 04.5 mg/dL   Total Protein 7.4 6.5 - 8.1 g/dL   Albumin 4.2 3.5 - 5.0 g/dL   AST 16 15 - 41 U/L   ALT 15 0 - 44 U/L   Alkaline Phosphatase 80 38 - 126 U/L   Total Bilirubin 1.0 0.3 - 1.2 mg/dL   GFR, Estimated >40 >98 mL/min   Anion gap 8 5 - 15  CBC with Differential (Cancer Center Only)  Result Value Ref Range   WBC Count 7.7 4.0 - 10.5 K/uL   RBC 4.28 3.87 - 5.11 MIL/uL   Hemoglobin 12.8 12.0 - 15.0 g/dL   HCT 11.9 14.7 -  82.9 %  MCV 89.3 80.0 - 100.0 fL   MCH 29.9 26.0 - 34.0 pg   MCHC 33.5 30.0 - 36.0 g/dL   RDW 16.1 09.6 - 04.5 %   Platelet Count 346 150 - 400 K/uL   nRBC 0.0 0.0 - 0.2 %   Neutrophils Relative % 61 %   Neutro Abs 4.7 1.7 - 7.7 K/uL   Lymphocytes Relative 32 %   Lymphs Abs 2.4 0.7 - 4.0 K/uL   Monocytes Relative 5 %   Monocytes Absolute 0.4 0.1 - 1.0 K/uL   Eosinophils Relative 2 %   Eosinophils Absolute 0.1 0.0 - 0.5 K/uL   Basophils Relative 0 %   Basophils Absolute 0.0 0.0 - 0.1 K/uL   Immature Granulocytes 0 %   Abs Immature Granulocytes 0.02 0.00 - 0.07 K/uL    Assessment and Plan  Acute right flank pain Assessment & Plan: Acute, symptoms most consistent with thoracic 9 musculoskeletal strain.  She does state her symptoms are very typical of her for urinary tract infection.  Urinalysis in the office today is clear, no blood suggesting against down.  No sign of infection.  Will send urine for culture.  Treat with heat, start gentle stretching, diclofenac 75 mg p.o. twice daily, start muscle relaxant at bedtime Flexeril 15 mg p.o. nightly  Patient instructed to call if fever unable to contact medications or worsening symptoms.  Return and ER precautions provided.  Orders: -     POCT Urinalysis Dipstick (Automated) -     Urine Culture  Other orders -     Diclofenac Sodium; Take 1 tablet (75 mg total) by mouth 2 (two) times daily.  Dispense: 30 tablet; Refill: 0 -     Cyclobenzaprine HCl; Take 1 tablet (10 mg total) by mouth at bedtime as needed for muscle spasms.  Dispense: 15 tablet; Refill: 0    No follow-ups on file.   Kerby Nora, MD

## 2023-09-08 NOTE — Assessment & Plan Note (Signed)
Acute, symptoms most consistent with thoracic 9 musculoskeletal strain.  She does state her symptoms are very typical of her for urinary tract infection.  Urinalysis in the office today is clear, no blood suggesting against down.  No sign of infection.  Will send urine for culture.  Treat with heat, start gentle stretching, diclofenac 75 mg p.o. twice daily, start muscle relaxant at bedtime Flexeril 15 mg p.o. nightly  Patient instructed to call if fever unable to contact medications or worsening symptoms.  Return and ER precautions provided.

## 2023-09-08 NOTE — Patient Instructions (Signed)
Start heat on low back, gentle stretching. Hold ibuprofen given ineffective. Start diclofenac 75 mg twice daily, can use muscle relaxant at night as needed.

## 2023-09-10 ENCOUNTER — Other Ambulatory Visit: Payer: Self-pay | Admitting: Family Medicine

## 2023-09-10 LAB — URINE CULTURE
MICRO NUMBER:: 15602743
SPECIMEN QUALITY:: ADEQUATE

## 2023-09-10 MED ORDER — CEPHALEXIN 500 MG PO CAPS: 500.0000 mg | ORAL_CAPSULE | Freq: Three times a day (TID) | ORAL | 0 refills | Status: DC

## 2023-10-08 ENCOUNTER — Other Ambulatory Visit: Payer: 59

## 2023-10-15 ENCOUNTER — Other Ambulatory Visit (INDEPENDENT_AMBULATORY_CARE_PROVIDER_SITE_OTHER): Payer: 59

## 2023-10-15 DIAGNOSIS — E119 Type 2 diabetes mellitus without complications: Secondary | ICD-10-CM

## 2023-10-15 DIAGNOSIS — E785 Hyperlipidemia, unspecified: Secondary | ICD-10-CM | POA: Diagnosis not present

## 2023-10-15 DIAGNOSIS — E538 Deficiency of other specified B group vitamins: Secondary | ICD-10-CM | POA: Diagnosis not present

## 2023-10-15 DIAGNOSIS — E1169 Type 2 diabetes mellitus with other specified complication: Secondary | ICD-10-CM | POA: Diagnosis not present

## 2023-10-15 DIAGNOSIS — E559 Vitamin D deficiency, unspecified: Secondary | ICD-10-CM

## 2023-10-15 LAB — CBC
HCT: 38.7 % (ref 36.0–46.0)
Hemoglobin: 13 g/dL (ref 12.0–15.0)
MCHC: 33.6 g/dL (ref 30.0–36.0)
MCV: 89 fL (ref 78.0–100.0)
Platelets: 371 10*3/uL (ref 150.0–400.0)
RBC: 4.35 Mil/uL (ref 3.87–5.11)
RDW: 13.1 % (ref 11.5–15.5)
WBC: 7.6 10*3/uL (ref 4.0–10.5)

## 2023-10-15 LAB — COMPREHENSIVE METABOLIC PANEL
ALT: 22 U/L (ref 0–35)
AST: 18 U/L (ref 0–37)
Albumin: 4.3 g/dL (ref 3.5–5.2)
Alkaline Phosphatase: 93 U/L (ref 39–117)
BUN: 11 mg/dL (ref 6–23)
CO2: 29 meq/L (ref 19–32)
Calcium: 9.7 mg/dL (ref 8.4–10.5)
Chloride: 99 meq/L (ref 96–112)
Creatinine, Ser: 0.82 mg/dL (ref 0.40–1.20)
GFR: 84.38 mL/min (ref >60.00)
Glucose, Bld: 157 mg/dL — ABNORMAL HIGH (ref 70–99)
Potassium: 4.4 meq/L (ref 3.5–5.1)
Sodium: 138 meq/L (ref 135–145)
Total Bilirubin: 0.9 mg/dL (ref 0.2–1.2)
Total Protein: 7.1 g/dL (ref 6.0–8.3)

## 2023-10-15 LAB — LIPID PANEL
Cholesterol: 259 mg/dL — ABNORMAL HIGH (ref 0–200)
HDL: 42.9 mg/dL (ref >39.00)
LDL Cholesterol: 176 mg/dL — ABNORMAL HIGH (ref 0–99)
NonHDL: 215.83
Total CHOL/HDL Ratio: 6
Triglycerides: 201 mg/dL — ABNORMAL HIGH (ref 0.0–149.0)
VLDL: 40.2 mg/dL — ABNORMAL HIGH (ref 0.0–40.0)

## 2023-10-15 LAB — VITAMIN D 25 HYDROXY (VIT D DEFICIENCY, FRACTURES): VITD: 18.95 ng/mL — ABNORMAL LOW (ref 30.00–100.00)

## 2023-10-18 ENCOUNTER — Other Ambulatory Visit: Payer: Self-pay | Admitting: Family

## 2023-10-18 ENCOUNTER — Encounter: Payer: Self-pay | Admitting: Family

## 2023-10-18 DIAGNOSIS — E1169 Type 2 diabetes mellitus with other specified complication: Secondary | ICD-10-CM

## 2023-10-18 DIAGNOSIS — E559 Vitamin D deficiency, unspecified: Secondary | ICD-10-CM

## 2023-10-18 DIAGNOSIS — E119 Type 2 diabetes mellitus without complications: Secondary | ICD-10-CM

## 2023-10-18 MED ORDER — CHOLECALCIFEROL 1.25 MG (50000 UT) PO TABS
1.0000 | ORAL_TABLET | ORAL | 0 refills | Status: DC
Start: 2023-10-18 — End: 2024-01-07

## 2023-10-19 MED ORDER — PRAVASTATIN SODIUM 20 MG PO TABS
20.0000 mg | ORAL_TABLET | Freq: Every day | ORAL | 3 refills | Status: DC
Start: 2023-10-19 — End: 2024-01-10

## 2023-10-20 ENCOUNTER — Encounter: Payer: Self-pay | Admitting: Family

## 2023-10-20 MED ORDER — TIRZEPATIDE 2.5 MG/0.5ML ~~LOC~~ SOAJ
2.5000 mg | SUBCUTANEOUS | 0 refills | Status: DC
Start: 2023-10-20 — End: 2023-11-23

## 2023-10-20 NOTE — Addendum Note (Signed)
Addended by: Mort Sawyers on: 10/20/2023 12:46 PM   Modules accepted: Orders

## 2023-10-20 NOTE — Telephone Encounter (Signed)
[facility] [address], [place], [place] [address] Ph: [phone] Fax: [phone] Care team updated and letter sent for eye exam notes.

## 2023-11-22 ENCOUNTER — Other Ambulatory Visit: Payer: Self-pay | Admitting: Family

## 2023-11-22 ENCOUNTER — Encounter: Payer: Self-pay | Admitting: Family

## 2023-11-22 DIAGNOSIS — E119 Type 2 diabetes mellitus without complications: Secondary | ICD-10-CM

## 2023-11-23 ENCOUNTER — Other Ambulatory Visit: Payer: Self-pay | Admitting: Family

## 2023-11-23 ENCOUNTER — Encounter: Payer: Self-pay | Admitting: Family

## 2023-11-23 DIAGNOSIS — E119 Type 2 diabetes mellitus without complications: Secondary | ICD-10-CM

## 2023-11-23 MED ORDER — TIRZEPATIDE 5 MG/0.5ML ~~LOC~~ SOAJ: 5.0000 mg | SUBCUTANEOUS | 0 refills | Status: DC

## 2023-11-23 NOTE — Telephone Encounter (Signed)
LM for pt to returncall

## 2023-11-23 NOTE — Telephone Encounter (Signed)
 Do you mind just calling pt to verify dose? I am getting confused by her response.   She was taking 2.5 and just completed four weeks of 5 -OR- she is taking 2.5 and is about to go up to the 5 mg?  And if that is the case, what is the dose she needs a refill on?

## 2023-11-23 NOTE — Telephone Encounter (Signed)
Spoke with pt and she has done 4 doses of 2.5mg ; so she needs a rx for 5mg .

## 2023-11-30 ENCOUNTER — Other Ambulatory Visit: Payer: Self-pay | Admitting: Family

## 2023-11-30 DIAGNOSIS — E119 Type 2 diabetes mellitus without complications: Secondary | ICD-10-CM

## 2023-12-14 ENCOUNTER — Other Ambulatory Visit: Payer: Self-pay | Admitting: Family

## 2023-12-14 DIAGNOSIS — E119 Type 2 diabetes mellitus without complications: Secondary | ICD-10-CM

## 2024-01-07 ENCOUNTER — Encounter: Payer: Self-pay | Admitting: Family

## 2024-01-07 ENCOUNTER — Ambulatory Visit: Payer: 59 | Admitting: Family

## 2024-01-07 VITALS — BP 126/86 | HR 100 | Temp 98.4°F | Ht 62.0 in | Wt 217.8 lb

## 2024-01-07 DIAGNOSIS — E559 Vitamin D deficiency, unspecified: Secondary | ICD-10-CM | POA: Diagnosis not present

## 2024-01-07 DIAGNOSIS — Z853 Personal history of malignant neoplasm of breast: Secondary | ICD-10-CM | POA: Insufficient documentation

## 2024-01-07 DIAGNOSIS — E119 Type 2 diabetes mellitus without complications: Secondary | ICD-10-CM

## 2024-01-07 DIAGNOSIS — E1169 Type 2 diabetes mellitus with other specified complication: Secondary | ICD-10-CM | POA: Diagnosis not present

## 2024-01-07 DIAGNOSIS — E538 Deficiency of other specified B group vitamins: Secondary | ICD-10-CM

## 2024-01-07 DIAGNOSIS — Z7985 Long-term (current) use of injectable non-insulin antidiabetic drugs: Secondary | ICD-10-CM

## 2024-01-07 DIAGNOSIS — E785 Hyperlipidemia, unspecified: Secondary | ICD-10-CM | POA: Diagnosis not present

## 2024-01-07 LAB — BASIC METABOLIC PANEL
BUN: 10 mg/dL (ref 6–23)
CO2: 30 meq/L (ref 19–32)
Calcium: 9.1 mg/dL (ref 8.4–10.5)
Chloride: 101 meq/L (ref 96–112)
Creatinine, Ser: 0.79 mg/dL (ref 0.40–1.20)
GFR: 88.1 mL/min (ref >60.00)
Glucose, Bld: 105 mg/dL — ABNORMAL HIGH (ref 70–99)
Potassium: 4.4 meq/L (ref 3.5–5.1)
Sodium: 140 meq/L (ref 135–145)

## 2024-01-07 LAB — LIPID PANEL
Cholesterol: 227 mg/dL — ABNORMAL HIGH (ref 0–200)
HDL: 55.1 mg/dL (ref >39.00)
LDL Cholesterol: 142 mg/dL — ABNORMAL HIGH (ref 0–99)
NonHDL: 171.77
Total CHOL/HDL Ratio: 4
Triglycerides: 147 mg/dL (ref 0.0–149.0)
VLDL: 29.4 mg/dL (ref 0.0–40.0)

## 2024-01-07 LAB — MICROALBUMIN / CREATININE URINE RATIO
Creatinine,U: 113.4 mg/dL
Microalb Creat Ratio: 6.2 mg/g (ref 0.0–30.0)
Microalb, Ur: <0.7 mg/dL (ref 0.0–1.9)

## 2024-01-07 LAB — VITAMIN D 25 HYDROXY (VIT D DEFICIENCY, FRACTURES): VITD: 35.65 ng/mL (ref 30.00–100.00)

## 2024-01-07 LAB — VITAMIN B12: Vitamin B-12: 108 pg/mL — ABNORMAL LOW (ref 211–911)

## 2024-01-07 MED ORDER — TIRZEPATIDE 7.5 MG/0.5ML ~~LOC~~ SOAJ: 7.5000 mg | SUBCUTANEOUS | 0 refills | Status: DC

## 2024-01-07 NOTE — Progress Notes (Signed)
Established Patient Office Visit  Subjective:      CC:  Chief Complaint  Patient presents with   Medical Management of Chronic Issues    HPI: Cheryl [name] is a 48 y.o. female presenting on 01/07/2024 for Medical Management of Chronic Issues . DM2: switched to mounjaro , had to switch from ozempic because nausea and stomach discomfort which has improved.   Wt Readings from Last 3 Encounters:  01/07/24 217 lb 12.8 oz (98.8 kg)  09/08/23 214 lb (97.1 kg)  07/06/23 208 lb 14.4 oz (94.8 kg)   HLD: still doing well with pravastatin 20 mg and compliant. Tolerating well.   Exercise: not physical active currently.  Diet: not working on diet but has goals for both exercise and diet.   H/o breast cancer last visit no longer with oncology follow ups.        Social history:  Relevant past medical, surgical, family and social history reviewed and updated as indicated. Interim medical history since our last visit reviewed.  Allergies and medications reviewed and updated.  DATA REVIEWED: CHART IN EPIC     ROS: Negative unless specifically indicated above in HPI.    Current Outpatient Medications:    pravastatin (PRAVACHOL) 20 MG tablet, Take 1 tablet (20 mg total) by mouth daily., Disp: 90 tablet, Rfl: 3   tirzepatide (MOUNJARO) 7.5 MG/0.5ML Pen, Inject 7.5 mg into the skin once a week., Disp: 2 mL, Rfl: 0      Objective:    BP 126/86 (BP Location: Left Arm, Patient Position: Sitting, Cuff Size: Normal)   Pulse 100   Temp 98.4 F (36.9 C) (Temporal)   Ht 5\' 2"  (1.575 m)   Wt 217 lb 12.8 oz (98.8 kg)   LMP 04/02/2015 (Approximate) Comment: after chemo - no periods  SpO2 98%   BMI 39.84 kg/m   Wt Readings from Last 3 Encounters:  01/07/24 217 lb 12.8 oz (98.8 kg)  09/08/23 214 lb (97.1 kg)  07/06/23 208 lb 14.4 oz (94.8 kg)    Physical Exam Constitutional:      General: [name] not in acute distress.    Appearance: Normal appearance. [name]  normal weight. [name] not ill-appearing, toxic-appearing or diaphoretic.  HENT:     Head: Normocephalic.  Cardiovascular:     Rate and Rhythm: Normal rate and regular rhythm.  Pulmonary:     Effort: Pulmonary effort is normal.  Musculoskeletal:        General: Normal range of motion.  Neurological:     General: No focal deficit present.     Mental Status: [name] alert and oriented to person, place, and time. Mental status is at baseline.  Psychiatric:        Mood and Affect: Mood normal.        Behavior: Behavior normal.        Thought Content: Thought content normal.        Judgment: Judgment normal.           Assessment & Plan:  Diabetes mellitus without complication [facility]) Assessment & Plan: Ordered hga1c today pending results. Work on diabetic diet and exercise as tolerated. Yearly foot exam, and annual eye exam.   Cont mounjaro will increase to 7.5 mg weekly Urine m/a ordered   Orders: -     Tirzepatide; Inject 7.5 mg into the skin once a week.  Dispense: 2 mL; Refill: 0 -     Microalbumin / creatinine urine ratio -  Hemoglobin A1c; Future -     Basic metabolic panel  History of breast cancer in female  Low serum vitamin B12 -     Vitamin B12  Vitamin D deficiency Assessment & Plan: Ordered vitamin d pending results.    Orders: -     VITAMIN D 25 Hydroxy (Vit-D Deficiency, Fractures)  Hyperlipidemia associated with type 2 diabetes mellitus (HCC) Assessment & Plan: Continue pravastatin 20 mg nightly. D/w pt on compliance.  Ordered lipid panel, pending results. Work on low cholesterol diet and exercise as tolerated   Orders: -     Lipid panel  Vitamin B12 deficiency Assessment & Plan: Ordered b12 pending results       Return in about 6 months (around 07/06/2024) for f/u diabetes.  Mort Sawyers, MSN, APRN, FNP-C [facility]

## 2024-01-07 NOTE — Assessment & Plan Note (Signed)
Continue pravastatin 20 mg nightly. D/w pt on compliance.  Ordered lipid panel, pending results. Work on low cholesterol diet and exercise as tolerated

## 2024-01-07 NOTE — Assessment & Plan Note (Signed)
Ordered vitamin d pending results.

## 2024-01-07 NOTE — Assessment & Plan Note (Signed)
Ordered b12 pending results

## 2024-01-07 NOTE — Assessment & Plan Note (Signed)
Ordered hga1c today pending results. Work on diabetic diet and exercise as tolerated. Yearly foot exam, and annual eye exam.   Cont mounjaro will increase to 7.5 mg weekly Urine m/a ordered

## 2024-01-10 ENCOUNTER — Encounter: Payer: Self-pay | Admitting: Family

## 2024-01-10 DIAGNOSIS — E1169 Type 2 diabetes mellitus with other specified complication: Secondary | ICD-10-CM

## 2024-01-10 MED ORDER — TIRZEPATIDE 10 MG/0.5ML ~~LOC~~ SOAJ
10.0000 mg | SUBCUTANEOUS | 0 refills | Status: DC
Start: 2024-01-10 — End: 2024-02-09

## 2024-01-10 MED ORDER — PRAVASTATIN SODIUM 40 MG PO TABS: 40.0000 mg | ORAL_TABLET | Freq: Every day | ORAL | 3 refills | Status: DC

## 2024-01-11 ENCOUNTER — Other Ambulatory Visit (HOSPITAL_COMMUNITY): Payer: Self-pay

## 2024-01-12 ENCOUNTER — Telehealth: Payer: Self-pay

## 2024-01-12 ENCOUNTER — Other Ambulatory Visit (HOSPITAL_COMMUNITY): Payer: Self-pay

## 2024-01-12 ENCOUNTER — Encounter: Payer: Self-pay | Admitting: Oncology

## 2024-01-12 NOTE — Telephone Encounter (Signed)
 Pharmacy Patient Advocate Encounter   Received notification from  [employer]  that prior authorization for [facility] 10MG /0.5ML auto-injectors is required/requested.   Insurance verification completed.   The patient is insured through [facility] .   Per test claim: PA required; PA submitted to above mentioned insurance via CoverMyMeds Key/confirmation #/EOC [id number] Status is pending

## 2024-01-18 NOTE — Telephone Encounter (Signed)
 Pharmacy Patient Advocate Encounter  Received notification from [facility] that Prior Authorization for [facility] has been APPROVED from 01/12/2024 to 01/11/2025   PA #/Case ID/Reference #: [id number]

## 2024-02-09 ENCOUNTER — Other Ambulatory Visit: Payer: Self-pay | Admitting: Family

## 2024-02-09 DIAGNOSIS — E119 Type 2 diabetes mellitus without complications: Secondary | ICD-10-CM

## 2024-02-09 MED ORDER — TIRZEPATIDE 10 MG/0.5ML ~~LOC~~ SOAJ
10.0000 mg | SUBCUTANEOUS | 0 refills | Status: DC
Start: 2024-02-09 — End: 2024-04-05

## 2024-02-09 NOTE — Telephone Encounter (Signed)
 Copied from CRM [id number]. Topic: Clinical - Medication Refill >> Feb 09, 2024  2:58 PM Turkey A wrote: Most Recent Primary Care Visit:  Provider: Mort Sawyers  Department: [facility]  Visit Type: OFFICE VISIT  Date: 01/07/2024  Medication: tirzepatide Greggory Keen) 10 MG/0.5ML Pen  Has the patient contacted their pharmacy?   No Use [facility] (Agent: If no, request that the patient contact the pharmacy for the refill. If patient does not wish to contact the pharmacy document the reason why and proceed with request.) (Agent: If yes, when and what did the pharmacy advise?)  Is this the correct pharmacy for this prescription? No If no, delete pharmacy and type the correct one.  This is the patient's preferred pharmacy:  [facility] - [place], [place] - [address] [place] [address] Phone: [phone] Fax: [phone]  [facility] [address] [place]), [place] - [address] [place] ([place]) [place] [address] Phone: [phone] Fax: [phone]   Has the prescription been filled recently? No  Is the patient out of the medication? Yes  Has the patient been seen for an appointment in the last year OR does the patient have an upcoming appointment? Yes  Can we respond through MyChart? Yes  Agent: Please be advised that Rx refills may take up to 3 business days. We ask that you follow-up with your pharmacy.

## 2024-02-10 ENCOUNTER — Other Ambulatory Visit: Payer: Self-pay | Admitting: Family

## 2024-02-10 DIAGNOSIS — E119 Type 2 diabetes mellitus without complications: Secondary | ICD-10-CM

## 2024-04-05 ENCOUNTER — Other Ambulatory Visit: Payer: Self-pay | Admitting: *Deleted

## 2024-04-05 DIAGNOSIS — E119 Type 2 diabetes mellitus without complications: Secondary | ICD-10-CM

## 2024-04-05 MED ORDER — TIRZEPATIDE 10 MG/0.5ML ~~LOC~~ SOAJ: 10.0000 mg | SUBCUTANEOUS | 0 refills | Status: DC

## 2024-06-09 ENCOUNTER — Ambulatory Visit (INDEPENDENT_AMBULATORY_CARE_PROVIDER_SITE_OTHER): Admitting: Family Medicine

## 2024-06-09 VITALS — BP 124/80 | HR 85 | Temp 98.0°F | Ht 62.0 in | Wt 201.0 lb

## 2024-06-09 DIAGNOSIS — N3001 Acute cystitis with hematuria: Secondary | ICD-10-CM | POA: Diagnosis not present

## 2024-06-09 DIAGNOSIS — R300 Dysuria: Secondary | ICD-10-CM

## 2024-06-09 LAB — POC URINALSYSI DIPSTICK (AUTOMATED)
Bilirubin, UA: NEGATIVE
Blood, UA: 10
Glucose, UA: NEGATIVE
Ketones, UA: NEGATIVE
Nitrite, UA: POSITIVE
Protein, UA: POSITIVE — AB
Spec Grav, UA: 1.02 (ref 1.010–1.025)
Urobilinogen, UA: 0.2 U/dL
pH, UA: 6 (ref 5.0–8.0)

## 2024-06-09 MED ORDER — SULFAMETHOXAZOLE-TRIMETHOPRIM 800-160 MG PO TABS
1.0000 | ORAL_TABLET | Freq: Two times a day (BID) | ORAL | 0 refills | Status: DC
Start: 2024-06-09 — End: 2024-06-16

## 2024-06-09 NOTE — Assessment & Plan Note (Addendum)
 Acute, uncomplicated, no red flags. Urine culture ordered pending results A Start bactrim DS BID x 3 days.  Encouraged increased water intake throughout the day.   Return and ER precautions provided.

## 2024-06-09 NOTE — Progress Notes (Signed)
 Patient ID: Cheryl [name], female    DOB: January 06, 1976, 48 y.o.   MRN: [id number]  This visit was conducted in person.  BP 124/80   Pulse 85   Temp 98 F (36.7 C) (Oral)   Ht 5' 2 (1.575 m)   Wt 201 lb (91.2 kg)   LMP 04/02/2015 (Approximate) Comment: after chemo - no periods  SpO2 99%   BMI 36.76 kg/m    CC:  Chief Complaint  Patient presents with   Dysuria    With burning, lower abd pain and urinary pressure x 2 days. Has not taken anything over the counter.     Subjective:   HPI: Cheryl [name] is a 48 y.o. female presenting on 06/09/2024 for Dysuria (With burning, lower abd pain and urinary pressure x 2 days. Has not taken anything over the counter. )  Dysuria  This is a new problem. The current episode started in the past 7 days. The quality of the pain is described as burning. The pain is at a severity of 2/10. The pain is mild. There has been no fever. [name] Not sexually active. There is No history of pyelonephritis. Associated symptoms include frequency, hesitancy, nausea and urgency. Pertinent negatives include no chills, discharge, flank pain, hematuria, possible pregnancy or vomiting. Associated symptoms comments: Lower abd pressure and pain. She has tried increased fluids for the symptoms. The treatment provided mild relief. There is no history of catheterization, kidney stones, recurrent UTIs, a single kidney, urinary stasis or a urological procedure.       Urine culture [facility] 08/2024   No recnet antibiotics.   Relevant past medical, surgical, family and social history reviewed and updated as indicated. Interim medical history since our last visit reviewed. Allergies and medications reviewed and updated. Outpatient Medications Prior to Visit  Medication Sig Dispense Refill   pravastatin  (PRAVACHOL ) 40 MG tablet Take 1 tablet (40 mg total) by mouth daily. 90 tablet 3   tirzepatide  (MOUNJARO ) 10 MG/0.5ML Pen Inject 10 mg into the skin once  a week. 6 mL 0   No facility-administered medications prior to visit.     Per HPI unless specifically indicated in ROS section below Review of Systems  Constitutional:  Negative for chills.  Gastrointestinal:  Positive for nausea. Negative for vomiting.  Genitourinary:  Positive for dysuria, frequency, hesitancy and urgency. Negative for flank pain and hematuria.   Objective:  BP 124/80   Pulse 85   Temp 98 F (36.7 C) (Oral)   Ht 5' 2 (1.575 m)   Wt 201 lb (91.2 kg)   LMP 04/02/2015 (Approximate) Comment: after chemo - no periods  SpO2 99%   BMI 36.76 kg/m   Wt Readings from Last 3 Encounters:  06/09/24 201 lb (91.2 kg)  01/07/24 217 lb 12.8 oz (98.8 kg)  09/08/23 214 lb (97.1 kg)      Physical Exam Constitutional:      General: [name] not in acute distress.    Appearance: Normal appearance. [name] well-developed. [name] not ill-appearing or toxic-appearing.  HENT:     Head: Normocephalic.     Right Ear: Hearing, tympanic membrane, ear canal and external ear normal. Tympanic membrane is not erythematous, retracted or bulging.     Left Ear: Hearing, tympanic membrane, ear canal and external ear normal. Tympanic membrane is not erythematous, retracted or bulging.     Nose: No mucosal edema or rhinorrhea.     Right Sinus: No maxillary sinus tenderness  or frontal sinus tenderness.     Left Sinus: No maxillary sinus tenderness or frontal sinus tenderness.     Mouth/Throat:     Pharynx: Uvula midline.  Eyes:     General: Lids are normal. Lids are everted, no foreign bodies appreciated.     Conjunctiva/sclera: Conjunctivae normal.     Pupils: Pupils are equal, round, and reactive to light.  Neck:     Thyroid: No thyroid mass or thyromegaly.     Vascular: No carotid bruit.     Trachea: Trachea normal.  Cardiovascular:     Rate and Rhythm: Normal rate and regular rhythm.     Pulses: Normal pulses.     Heart sounds: Normal heart sounds, S1 normal and S2 normal. No murmur  heard.    No friction rub. No gallop.  Pulmonary:     Effort: Pulmonary effort is normal. No tachypnea or respiratory distress.     Breath sounds: Normal breath sounds. No decreased breath sounds, wheezing, rhonchi or rales.  Abdominal:     General: Bowel sounds are normal.     Palpations: Abdomen is soft.     Tenderness: There is abdominal tenderness in the suprapubic area. There is no right CVA tenderness or left CVA tenderness.  Musculoskeletal:     Cervical back: Normal range of motion and neck supple.  Skin:    General: Skin is warm and dry.     Findings: No rash.  Neurological:     Mental Status: [name] alert.  Psychiatric:        Mood and Affect: Mood is not anxious or depressed.        Speech: Speech normal.        Behavior: Behavior normal. Behavior is cooperative.        Thought Content: Thought content normal.        Judgment: Judgment normal.       Results for orders placed or performed in visit on 06/09/24  POCT Urinalysis Dipstick (Automated)   Collection Time: 06/09/24  3:30 PM  Result Value Ref Range   Color, UA yellow    Clarity, UA cloudy    Glucose, UA Negative Negative   Bilirubin, UA neg    Ketones, UA neg    Spec Grav, UA 1.020 1.010 - 1.025   Blood, UA 10 ery/uL    pH, UA 6.0 5.0 - 8.0   Protein, UA Positive (A) Negative   Urobilinogen, UA 0.2 0.2 or 1.0 E.U./dL   Nitrite, UA positive    Leukocytes, UA Moderate (2+) (A) Negative    Assessment and Plan  Dysuria -     POCT Urinalysis Dipstick (Automated)  Acute cystitis with hematuria Assessment & Plan:  Acute, uncomplicated, no red flags. Urine culture ordered pending results A Start bactrim DS BID x 3 days.  Encouraged increased water intake throughout the day.   Return and ER precautions provided.   Other orders -     Sulfamethoxazole-Trimethoprim; Take 1 tablet by mouth 2 (two) times daily.  Dispense: 6 tablet; Refill: 0    No follow-ups on file.   Greig Ring, MD

## 2024-06-11 LAB — URINE CULTURE
MICRO NUMBER:: 16717676
SPECIMEN QUALITY:: ADEQUATE

## 2024-06-12 ENCOUNTER — Ambulatory Visit: Payer: Self-pay | Admitting: Family Medicine

## 2024-06-15 ENCOUNTER — Encounter: Payer: Self-pay | Admitting: Family

## 2024-06-15 DIAGNOSIS — N3001 Acute cystitis with hematuria: Secondary | ICD-10-CM

## 2024-06-16 MED ORDER — SULFAMETHOXAZOLE-TRIMETHOPRIM 800-160 MG PO TABS
1.0000 | ORAL_TABLET | Freq: Two times a day (BID) | ORAL | 0 refills | Status: AC
Start: 2024-06-16 — End: 2024-06-21

## 2024-06-16 NOTE — Addendum Note (Signed)
 Addended by: CORWIN ANTU on: 06/16/2024 07:06 AM   Modules accepted: Orders

## 2024-06-27 ENCOUNTER — Other Ambulatory Visit: Payer: Self-pay | Admitting: Family

## 2024-06-27 DIAGNOSIS — E119 Type 2 diabetes mellitus without complications: Secondary | ICD-10-CM

## 2024-06-27 NOTE — Telephone Encounter (Signed)
 Lvm and sent MyChart to schedule apt per provider request

## 2024-06-30 ENCOUNTER — Other Ambulatory Visit: Payer: Self-pay | Admitting: Medical Genetics

## 2024-07-07 ENCOUNTER — Encounter: Payer: Self-pay | Admitting: Family

## 2024-07-07 ENCOUNTER — Ambulatory Visit: Admitting: Family

## 2024-07-07 VITALS — BP 124/86 | HR 89 | Temp 98.0°F | Ht 62.0 in | Wt 197.8 lb

## 2024-07-07 DIAGNOSIS — E1169 Type 2 diabetes mellitus with other specified complication: Secondary | ICD-10-CM | POA: Diagnosis not present

## 2024-07-07 DIAGNOSIS — E119 Type 2 diabetes mellitus without complications: Secondary | ICD-10-CM | POA: Diagnosis not present

## 2024-07-07 DIAGNOSIS — E785 Hyperlipidemia, unspecified: Secondary | ICD-10-CM

## 2024-07-07 DIAGNOSIS — E559 Vitamin D deficiency, unspecified: Secondary | ICD-10-CM

## 2024-07-07 DIAGNOSIS — E538 Deficiency of other specified B group vitamins: Secondary | ICD-10-CM

## 2024-07-07 DIAGNOSIS — Z7985 Long-term (current) use of injectable non-insulin antidiabetic drugs: Secondary | ICD-10-CM

## 2024-07-07 LAB — LIPID PANEL
Cholesterol: 250 mg/dL — ABNORMAL HIGH (ref 0–200)
HDL: 56 mg/dL (ref >39.00)
LDL Cholesterol: 167 mg/dL — ABNORMAL HIGH (ref 0–99)
NonHDL: 193.64
Total CHOL/HDL Ratio: 4
Triglycerides: 133 mg/dL (ref 0.0–149.0)
VLDL: 26.6 mg/dL (ref 0.0–40.0)

## 2024-07-07 LAB — COMPREHENSIVE METABOLIC PANEL WITH GFR
ALT: 16 U/L (ref 0–35)
AST: 17 U/L (ref 0–37)
Albumin: 4.5 g/dL (ref 3.5–5.2)
Alkaline Phosphatase: 83 U/L (ref 39–117)
BUN: 9 mg/dL (ref 6–23)
CO2: 29 meq/L (ref 19–32)
Calcium: 9.4 mg/dL (ref 8.4–10.5)
Chloride: 101 meq/L (ref 96–112)
Creatinine, Ser: 0.84 mg/dL (ref 0.40–1.20)
GFR: 81.56 mL/min (ref >60.00)
Glucose, Bld: 97 mg/dL (ref 70–99)
Potassium: 4.3 meq/L (ref 3.5–5.1)
Sodium: 139 meq/L (ref 135–145)
Total Bilirubin: 1.2 mg/dL (ref 0.2–1.2)
Total Protein: 7.3 g/dL (ref 6.0–8.3)

## 2024-07-07 LAB — VITAMIN D 25 HYDROXY (VIT D DEFICIENCY, FRACTURES): VITD: 27.94 ng/mL — ABNORMAL LOW (ref 30.00–100.00)

## 2024-07-07 LAB — VITAMIN B12: Vitamin B-12: >1500 pg/mL — ABNORMAL HIGH (ref 211–911)

## 2024-07-07 MED ORDER — VITAMIN B-12 1000 MCG PO TABS: 1000.0000 ug | ORAL_TABLET | Freq: Every day | ORAL | Status: AC

## 2024-07-07 MED ORDER — VITAMIN D3 250 MCG (10000 UT) PO CAPS: 10000.0000 [IU] | ORAL_CAPSULE | ORAL | Status: DC

## 2024-07-07 NOTE — Progress Notes (Signed)
 Established Patient Office Visit  Subjective:      CC:  Chief Complaint  Patient presents with   Medical Management of Chronic Issues    HPI: Cheryl [name] is a 48 y.o. female presenting on 07/07/2024 for Medical Management of Chronic Issues .  Discussed the use of AI scribe software for clinical note transcription with the patient, who gave verbal consent to proceed.  History of Present Illness Cheryl [name] is a 48 year old female with diabetes who presents for a follow-up visit.  [name] managing her diabetes with Mounjaro, currently at a dose of 10 mg weekly, and is tolerating it well without side effects such as nausea. Since her last visit, she has lost 25 pounds and reports an overall improvement in her well-being.  [name] on pravastatin for cholesterol management, with a recent increase in dosage from 10 mg to 20 mg. She reports no unusual muscle pains and is due for a repeat cholesterol check.  Her B12 levels were previously low, and she takes B12 supplements daily when she remembers. She also takes vitamin D supplements, aiming for once a week, using over-the-counter options likely at a high dose.  She fasted for today's visit, missing her morning coffee.  Wt Readings from Last 3 Encounters:  07/07/24 197 lb 12.8 oz (89.7 kg)  06/09/24 201 lb (91.2 kg)  01/07/24 217 lb 12.8 oz (98.8 kg)            Social history:  Relevant past medical, surgical, family and social history reviewed and updated as indicated. Interim medical history since our last visit reviewed.  Allergies and medications reviewed and updated.  DATA REVIEWED: CHART IN EPIC     ROS: Negative unless specifically indicated above in HPI.    Current Outpatient Medications:    Cholecalciferol (VITAMIN D3) 250 MCG (10000 UT) capsule, Take 1 capsule (10,000 Units total) by mouth once a week., Disp: , Rfl:    cyanocobalamin (VITAMIN B12) 1000 MCG tablet, Take 1  tablet (1,000 mcg total) by mouth daily., Disp: , Rfl:    MOUNJARO 10 MG/0.5ML Pen, INJECT 10 MG SUBCUTANEOUSLY  ONCE A WEEK, Disp: 12 mL, Rfl: 0   pravastatin (PRAVACHOL) 40 MG tablet, Take 1 tablet (40 mg total) by mouth daily., Disp: 90 tablet, Rfl: 3        Objective:        BP 124/86 (BP Location: Right Arm, Patient Position: Sitting, Cuff Size: Large)   Pulse 89   Temp 98 F (36.7 C) (Temporal)   Ht 5' 2 (1.575 m)   Wt 197 lb 12.8 oz (89.7 kg)   LMP 04/02/2015 (Approximate) Comment: after chemo - no periods  SpO2 99%   BMI 36.18 kg/m   Physical Exam VITALS: BP- 124/86 CARDIOVASCULAR: Heart normal, no murmurs, regular rhythm. EXTREMITIES: No swelling in the legs.  Wt Readings from Last 3 Encounters:  07/07/24 197 lb 12.8 oz (89.7 kg)  06/09/24 201 lb (91.2 kg)  01/07/24 217 lb 12.8 oz (98.8 kg)    Physical Exam       Results   Assessment & Plan:   Assessment and Plan Assessment & Plan Type 2 diabetes mellitus Type 2 diabetes mellitus is currently managed with Tirzepatide 10 mg weekly. She reports no side effects such as nausea and has experienced significant weight loss of 25 pounds since the last visit, indicating good control and tolerance of the medication. - Continue Tirzepatide 10 mg subcutaneous weekly - Order  comprehensive metabolic panel (CMP)  Mixed hyperlipidemia associated with type 2 diabetes mellitus Mixed hyperlipidemia is being managed with Pravastatin, which was increased from 10 mg to 20 mg at the last visit. She reports no muscle pain or other side effects. - Continue Pravastatin 20 mg oral daily - Order lipid panel  Vitamin D deficiency Vitamin D deficiency is being managed with over-the-counter high-dose vitamin D, taken once weekly. [name] not on a prescription dose and is unsure of the exact dosage but believes it to be high. Compliance is variable as she takes it when she remembers. - Order vitamin D level  Vitamin B12  deficiency Vitamin B12 deficiency is being managed with daily supplementation, although she admits to inconsistent adherence, taking it when she remembers. - Order vitamin B12 level  Recording duration: 5 minutes      Return in about 6 months (around 01/07/2025) for f/u CPE.     Ginger Patrick, MSN, APRN, FNP-C [facility]

## 2024-07-11 ENCOUNTER — Encounter: Payer: Self-pay | Admitting: Family

## 2024-07-13 ENCOUNTER — Ambulatory Visit: Payer: Self-pay | Admitting: Family

## 2024-07-13 DIAGNOSIS — E1169 Type 2 diabetes mellitus with other specified complication: Secondary | ICD-10-CM

## 2024-07-13 DIAGNOSIS — E559 Vitamin D deficiency, unspecified: Secondary | ICD-10-CM

## 2024-07-13 DIAGNOSIS — R350 Frequency of micturition: Secondary | ICD-10-CM

## 2024-07-13 MED ORDER — CHOLECALCIFEROL 1.25 MG (50000 UT) PO TABS: 1.0000 | ORAL_TABLET | ORAL | 0 refills | Status: AC

## 2024-07-13 MED ORDER — PRAVASTATIN SODIUM 80 MG PO TABS: 80.0000 mg | ORAL_TABLET | Freq: Every day | ORAL | 3 refills | Status: AC

## 2024-07-13 NOTE — Telephone Encounter (Signed)

## 2024-08-08 ENCOUNTER — Encounter: Payer: Self-pay | Admitting: Family

## 2024-08-11 ENCOUNTER — Other Ambulatory Visit: Admission: RE | Admit: 2024-08-11 | Source: Home / Self Care

## 2024-08-11 ENCOUNTER — Other Ambulatory Visit
Admission: RE | Admit: 2024-08-11 | Discharge: 2024-08-11 | Disposition: A | Discharge status: Home / Self Care | Payer: Self-pay | Source: Ambulatory Visit | Attending: Medical Genetics | Admitting: Medical Genetics

## 2024-08-20 LAB — GENECONNECT MOLECULAR SCREEN: Genetic Analysis Overall Interpretation: NEGATIVE

## 2024-09-15 ENCOUNTER — Other Ambulatory Visit: Payer: Self-pay | Admitting: Family

## 2024-09-15 DIAGNOSIS — E119 Type 2 diabetes mellitus without complications: Secondary | ICD-10-CM

## 2024-10-09 ENCOUNTER — Encounter: Payer: Self-pay | Admitting: Family

## 2024-10-09 DIAGNOSIS — E119 Type 2 diabetes mellitus without complications: Secondary | ICD-10-CM

## 2024-10-11 MED ORDER — TIRZEPATIDE 12.5 MG/0.5ML ~~LOC~~ SOAJ: 12.5000 mg | SUBCUTANEOUS | 2 refills | Status: DC

## 2024-10-11 NOTE — Addendum Note (Signed)
 Addended by: CORWIN ANTU on: 10/11/2024 02:28 PM   Modules accepted: Orders

## 2024-10-18 ENCOUNTER — Telehealth: Payer: Self-pay

## 2024-10-18 ENCOUNTER — Other Ambulatory Visit (HOSPITAL_COMMUNITY): Payer: Self-pay

## 2024-10-18 NOTE — Telephone Encounter (Signed)
 Pharmacy Patient Advocate Encounter   Received notification from Onbase that prior authorization for Mounjaro  12.5 is required/requested.   Insurance verification completed.   The patient is insured through [facility].   Patient has current approved PA that expires 01/11/25.  Spoke with [facility]. Pharmacy is unable to fill the rx and are awaiting patient to let them know where to transfer rx to.

## 2024-11-03 ENCOUNTER — Ambulatory Visit: Admitting: General Practice

## 2024-12-18 ENCOUNTER — Other Ambulatory Visit: Payer: Self-pay | Admitting: Family

## 2024-12-18 ENCOUNTER — Other Ambulatory Visit (HOSPITAL_COMMUNITY): Payer: Self-pay

## 2024-12-18 DIAGNOSIS — E119 Type 2 diabetes mellitus without complications: Secondary | ICD-10-CM

## 2024-12-18 NOTE — Telephone Encounter (Signed)
 Pt needs to schedule appt  Due in feb

## 2024-12-20 ENCOUNTER — Encounter: Payer: Self-pay | Admitting: Oncology

## 2024-12-22 ENCOUNTER — Encounter: Payer: Self-pay | Admitting: *Deleted

## 2024-12-22 ENCOUNTER — Other Ambulatory Visit: Payer: Self-pay | Admitting: *Deleted

## 2024-12-22 DIAGNOSIS — E119 Type 2 diabetes mellitus without complications: Secondary | ICD-10-CM

## 2024-12-22 MED ORDER — TIRZEPATIDE 12.5 MG/0.5ML ~~LOC~~ SOAJ: 12.5000 mg | SUBCUTANEOUS | 0 refills | Status: AC

## 2024-12-22 NOTE — Telephone Encounter (Signed)
 Refill resent to [facility] on [address].  Looks like last refill was sent to wrong pharmacy [facility]).  Patient has upcoming appointment with [facility] on 01/19/2025.

## 2024-12-22 NOTE — Telephone Encounter (Signed)
 Copied from CRM [id number]. Topic: Clinical - Medication Refill >> Dec 22, 2024  4:09 PM Alfonso HERO wrote: Medication: tirzepatide  (MOUNJARO ) 12.5 MG/0.5ML Pen  Has the patient contacted their pharmacy? Yes (Agent: If no, request that the patient contact the pharmacy for the refill. If patient does not wish to contact the pharmacy document the reason why and proceed with request.) (Agent: If yes, when and what did the pharmacy advise?)  This is the patient's preferred pharmacy:  [facility] [address] ([address]), [place] - [address] [place] ([place]) [place] [address] Phone: [phone] Fax: [phone]  Is this the correct pharmacy for this prescription? Yes If no, delete pharmacy and type the correct one.   Has the prescription been filled recently? Yes  Is the patient out of the medication? Yes  Has the patient been seen for an appointment in the last year OR does the patient have an upcoming appointment? Yes  Can we respond through MyChart? Yes  Agent: Please be advised that Rx refills may take up to 3 business days. We ask that you follow-up with your pharmacy.

## 2024-12-25 ENCOUNTER — Ambulatory Visit: Admitting: Family

## 2025-01-19 ENCOUNTER — Ambulatory Visit: Admitting: Family
